# Patient Record
Sex: Female | Born: 1960 | Race: White | Hispanic: No | Smoking: Current every day smoker
Health system: Southern US, Community
[De-identification: ages and names within clinical notes are randomized; demographics above are authoritative.]

## PROBLEM LIST (undated history)

## (undated) DIAGNOSIS — E7800 Pure hypercholesterolemia, unspecified: Secondary | ICD-10-CM

## (undated) DIAGNOSIS — I10 Essential (primary) hypertension: Secondary | ICD-10-CM

## (undated) DIAGNOSIS — J449 Chronic obstructive pulmonary disease, unspecified: Secondary | ICD-10-CM

## (undated) DIAGNOSIS — F17200 Nicotine dependence, unspecified, uncomplicated: Secondary | ICD-10-CM

## (undated) DIAGNOSIS — A048 Other specified bacterial intestinal infections: Secondary | ICD-10-CM

## (undated) HISTORY — DX: Other specified bacterial intestinal infections: A04.8

## (undated) HISTORY — PX: SALPINGECTOMY: SHX328

---

## 1998-02-02 ENCOUNTER — Encounter: Admission: RE | Admit: 1998-02-02 | Discharge: 1998-02-02 | Payer: Self-pay | Admitting: Family Medicine

## 1998-03-30 ENCOUNTER — Encounter: Admission: RE | Admit: 1998-03-30 | Discharge: 1998-03-30 | Payer: Self-pay | Admitting: Family Medicine

## 1998-04-06 ENCOUNTER — Encounter: Admission: RE | Admit: 1998-04-06 | Discharge: 1998-04-06 | Payer: Self-pay | Admitting: Family Medicine

## 1998-04-13 ENCOUNTER — Encounter: Admission: RE | Admit: 1998-04-13 | Discharge: 1998-04-13 | Payer: Self-pay | Admitting: Family Medicine

## 1998-04-20 ENCOUNTER — Encounter: Admission: RE | Admit: 1998-04-20 | Discharge: 1998-04-20 | Payer: Self-pay | Admitting: Family Medicine

## 1998-05-16 ENCOUNTER — Emergency Department (HOSPITAL_COMMUNITY): Admission: EM | Admit: 1998-05-16 | Discharge: 1998-05-16 | Payer: Self-pay | Admitting: Emergency Medicine

## 1998-05-20 ENCOUNTER — Encounter: Admission: RE | Admit: 1998-05-20 | Discharge: 1998-05-20 | Payer: Self-pay | Admitting: Family Medicine

## 1998-10-14 ENCOUNTER — Encounter: Admission: RE | Admit: 1998-10-14 | Discharge: 1998-10-14 | Payer: Self-pay | Admitting: Family Medicine

## 1998-12-02 ENCOUNTER — Encounter: Admission: RE | Admit: 1998-12-02 | Discharge: 1998-12-02 | Payer: Self-pay | Admitting: Family Medicine

## 1999-01-21 ENCOUNTER — Encounter: Admission: RE | Admit: 1999-01-21 | Discharge: 1999-01-21 | Payer: Self-pay | Admitting: Family Medicine

## 1999-04-25 ENCOUNTER — Emergency Department (HOSPITAL_COMMUNITY): Admission: EM | Admit: 1999-04-25 | Discharge: 1999-04-25 | Payer: Self-pay | Admitting: Emergency Medicine

## 1999-04-25 ENCOUNTER — Encounter: Payer: Self-pay | Admitting: Emergency Medicine

## 1999-06-14 ENCOUNTER — Emergency Department (HOSPITAL_COMMUNITY): Admission: EM | Admit: 1999-06-14 | Discharge: 1999-06-14 | Payer: Self-pay | Admitting: Emergency Medicine

## 1999-09-28 ENCOUNTER — Encounter: Admission: RE | Admit: 1999-09-28 | Discharge: 1999-09-28 | Payer: Self-pay | Admitting: Sports Medicine

## 2003-12-26 ENCOUNTER — Emergency Department (HOSPITAL_COMMUNITY): Admission: EM | Admit: 2003-12-26 | Discharge: 2003-12-26 | Payer: Self-pay | Admitting: Emergency Medicine

## 2004-06-30 ENCOUNTER — Emergency Department (HOSPITAL_COMMUNITY): Admission: EM | Admit: 2004-06-30 | Discharge: 2004-06-30 | Payer: Self-pay | Admitting: Podiatry

## 2005-11-15 ENCOUNTER — Emergency Department (HOSPITAL_COMMUNITY): Admission: EM | Admit: 2005-11-15 | Discharge: 2005-11-15 | Payer: Self-pay | Admitting: Emergency Medicine

## 2006-08-01 ENCOUNTER — Emergency Department (HOSPITAL_COMMUNITY): Admission: EM | Admit: 2006-08-01 | Discharge: 2006-08-01 | Payer: Self-pay | Admitting: Emergency Medicine

## 2008-02-19 ENCOUNTER — Emergency Department (HOSPITAL_COMMUNITY): Admission: EM | Admit: 2008-02-19 | Discharge: 2008-02-19 | Payer: Self-pay | Admitting: Emergency Medicine

## 2010-10-15 ENCOUNTER — Emergency Department (HOSPITAL_COMMUNITY)
Admission: EM | Admit: 2010-10-15 | Discharge: 2010-10-15 | Payer: Self-pay | Source: Home / Self Care | Admitting: Emergency Medicine

## 2012-09-24 ENCOUNTER — Ambulatory Visit: Payer: Self-pay | Admitting: Family Medicine

## 2013-04-11 ENCOUNTER — Other Ambulatory Visit (HOSPITAL_COMMUNITY): Payer: Self-pay | Admitting: Family Medicine

## 2013-04-11 DIAGNOSIS — Z1231 Encounter for screening mammogram for malignant neoplasm of breast: Secondary | ICD-10-CM

## 2013-04-18 ENCOUNTER — Ambulatory Visit (HOSPITAL_COMMUNITY)
Admission: RE | Admit: 2013-04-18 | Discharge: 2013-04-18 | Disposition: A | Discharge status: Home / Self Care | Payer: No Typology Code available for payment source | Source: Ambulatory Visit | Attending: Family Medicine | Admitting: Family Medicine

## 2013-04-18 DIAGNOSIS — Z1231 Encounter for screening mammogram for malignant neoplasm of breast: Secondary | ICD-10-CM | POA: Insufficient documentation

## 2013-05-02 ENCOUNTER — Other Ambulatory Visit (HOSPITAL_COMMUNITY): Payer: Self-pay | Admitting: Internal Medicine

## 2013-05-02 DIAGNOSIS — Z1231 Encounter for screening mammogram for malignant neoplasm of breast: Secondary | ICD-10-CM

## 2017-06-06 ENCOUNTER — Emergency Department (HOSPITAL_COMMUNITY)
Admission: EM | Admit: 2017-06-06 | Discharge: 2017-06-06 | Disposition: A | Discharge status: Home / Self Care | Payer: Medicaid Other | Attending: Emergency Medicine | Admitting: Emergency Medicine

## 2017-06-06 ENCOUNTER — Emergency Department (HOSPITAL_COMMUNITY): Payer: Medicaid Other

## 2017-06-06 ENCOUNTER — Encounter (HOSPITAL_COMMUNITY): Payer: Self-pay | Admitting: Emergency Medicine

## 2017-06-06 DIAGNOSIS — I10 Essential (primary) hypertension: Secondary | ICD-10-CM | POA: Diagnosis not present

## 2017-06-06 DIAGNOSIS — R110 Nausea: Secondary | ICD-10-CM | POA: Insufficient documentation

## 2017-06-06 DIAGNOSIS — R42 Dizziness and giddiness: Secondary | ICD-10-CM | POA: Insufficient documentation

## 2017-06-06 LAB — DIFFERENTIAL
Basophils Absolute: 0.1 10*3/uL (ref 0.0–0.1)
Basophils Relative: 0 %
EOS PCT: 3 %
Eosinophils Absolute: 0.3 10*3/uL (ref 0.0–0.7)
Lymphocytes Relative: 28 %
Lymphs Abs: 3.2 10*3/uL (ref 0.7–4.0)
MONO ABS: 0.7 10*3/uL (ref 0.1–1.0)
Monocytes Relative: 6 %
NEUTROS PCT: 63 %
Neutro Abs: 7.3 10*3/uL (ref 1.7–7.7)

## 2017-06-06 LAB — PROTIME-INR
INR: 0.98
Prothrombin Time: 13 seconds (ref 11.4–15.2)

## 2017-06-06 LAB — COMPREHENSIVE METABOLIC PANEL
ALK PHOS: 80 U/L (ref 38–126)
ALT: 15 U/L (ref 14–54)
ANION GAP: 10 (ref 5–15)
AST: 17 U/L (ref 15–41)
Albumin: 3.3 g/dL — ABNORMAL LOW (ref 3.5–5.0)
BUN: 11 mg/dL (ref 6–20)
CALCIUM: 9.4 mg/dL (ref 8.9–10.3)
CO2: 27 mmol/L (ref 22–32)
CREATININE: 0.99 mg/dL (ref 0.44–1.00)
Chloride: 102 mmol/L (ref 101–111)
GFR calc non Af Amer: >60 mL/min (ref >60)
Glucose, Bld: 138 mg/dL — ABNORMAL HIGH (ref 65–99)
Potassium: 4.3 mmol/L (ref 3.5–5.1)
Sodium: 139 mmol/L (ref 135–145)
TOTAL PROTEIN: 7.5 g/dL (ref 6.5–8.1)
Total Bilirubin: 0.5 mg/dL (ref 0.3–1.2)

## 2017-06-06 LAB — I-STAT CHEM 8, ED
BUN: 13 mg/dL (ref 6–20)
CHLORIDE: 101 mmol/L (ref 101–111)
Calcium, Ion: 1.14 mmol/L — ABNORMAL LOW (ref 1.15–1.40)
Creatinine, Ser: 0.9 mg/dL (ref 0.44–1.00)
Glucose, Bld: 138 mg/dL — ABNORMAL HIGH (ref 65–99)
HEMATOCRIT: 44 % (ref 36.0–46.0)
HEMOGLOBIN: 15 g/dL (ref 12.0–15.0)
POTASSIUM: 4.3 mmol/L (ref 3.5–5.1)
Sodium: 139 mmol/L (ref 135–145)
TCO2: 29 mmol/L (ref 0–100)

## 2017-06-06 LAB — CBC
HCT: 43.5 % (ref 36.0–46.0)
Hemoglobin: 14.1 g/dL (ref 12.0–15.0)
MCH: 30 pg (ref 26.0–34.0)
MCHC: 32.4 g/dL (ref 30.0–36.0)
MCV: 92.6 fL (ref 78.0–100.0)
PLATELETS: 319 10*3/uL (ref 150–400)
RBC: 4.7 MIL/uL (ref 3.87–5.11)
RDW: 14.5 % (ref 11.5–15.5)
WBC: 11.6 10*3/uL — AB (ref 4.0–10.5)

## 2017-06-06 LAB — APTT: aPTT: 32 seconds (ref 24–36)

## 2017-06-06 LAB — ETHANOL

## 2017-06-06 LAB — I-STAT TROPONIN, ED: Troponin i, poc: 0.01 ng/mL (ref 0.00–0.08)

## 2017-06-06 LAB — CBG MONITORING, ED: GLUCOSE-CAPILLARY: 128 mg/dL — AB (ref 65–99)

## 2017-06-06 MED ORDER — AMLODIPINE BESYLATE 5 MG PO TABS: 5.0000 mg | ORAL_TABLET | Freq: Every day | ORAL | 1 refills | Status: DC

## 2017-06-06 NOTE — ED Provider Notes (Signed)
[facility]-EMERGENCY DEPT Provider Note   CSN: [id number] Arrival date & time: 06/06/17  1607   An emergency department physician performed an initial assessment on this suspected stroke patient at 1625.  History   Chief Complaint Chief Complaint  Patient presents with  . Dizziness    HPI Kim [name] is a 56 y.o. female.  She presents for evaluation of sudden onset of dizziness, around 1 PM today.  She was at home at that time, sitting, and watching TV.  She describes the dizziness as "lightheadedness."  When queried, she states that she felt "off balance."  She denies a spinning sensation.  She was able to walk without exacerbation of her dizziness.  She denies associated headache.  She had mild nausea, without vomiting.  No recent illnesses including fever, chills, chest pain, shortness of breath, neck pain or back pain.  She is a smoker.  She does not have a primary care doctor.  She cannot recall the last time she had her blood pressure checked.  There are no other known modifying factors.   HPI  History reviewed. No pertinent past medical history.  There are no active problems to display for this patient.   History reviewed. No pertinent surgical history.  OB History    No data available       Home Medications    Prior to Admission medications   Medication Sig Start Date End Date Taking? Authorizing Provider  amLODipine (NORVASC) 5 MG tablet Take 1 tablet (5 mg total) by mouth daily. 06/06/17   Mancel Bale, MD    Family History No family history on file.  Social History Social History  Substance Use Topics  . Smoking status: Not on file  . Smokeless tobacco: Not on file  . Alcohol use Not on file     Allergies   Patient has no known allergies.   Review of Systems Review of Systems  All other systems reviewed and are negative.    Physical Exam Updated Vital Signs BP (!) 186/64 (BP Location: Right Arm)   Pulse 81   Temp 98 F (36.7 C)   Resp 16    SpO2 96%   Physical Exam  Constitutional: She is oriented to person, place, and time. She appears well-developed and well-nourished. No distress.  Obese, appears older than stated age.  HENT:  Head: Normocephalic and atraumatic.  Eyes: Pupils are equal, round, and reactive to light. Conjunctivae and EOM are normal.  Neck: Normal range of motion and phonation normal. Neck supple.  Cardiovascular: Normal rate and regular rhythm.   Blood pressure cuff on right forearm.  Pulmonary/Chest: Effort normal and breath sounds normal. No respiratory distress. She exhibits no tenderness.  Abdominal: Soft. She exhibits no distension. There is no tenderness. There is no guarding.  Musculoskeletal: Normal range of motion.  Neurological: She is alert and oriented to person, place, and time. No cranial nerve deficit or sensory deficit. She exhibits normal muscle tone. Coordination normal.  No dysarthria, aphasia or nystagmus.  Negative test of skew and negative head impulse testing.  Skin: Skin is warm and dry.  Psychiatric: She has a normal mood and affect. Her behavior is normal. Judgment and thought content normal.  Nursing note and vitals reviewed.    ED Treatments / Results  Labs (all labs ordered are listed, but only abnormal results are displayed) Labs Reviewed  CBC - Abnormal; Notable for the following:       Result Value   WBC 11.6 (*)  All other components within normal limits  COMPREHENSIVE METABOLIC PANEL - Abnormal; Notable for the following:    Glucose, Bld 138 (*)    Albumin 3.3 (*)    All other components within normal limits  I-STAT CHEM 8, ED - Abnormal; Notable for the following:    Glucose, Bld 138 (*)    Calcium, Ion 1.14 (*)    All other components within normal limits  CBG MONITORING, ED - Abnormal; Notable for the following:    Glucose-Capillary 128 (*)    All other components within normal limits  ETHANOL  PROTIME-INR  APTT  DIFFERENTIAL  I-STAT TROPONIN, ED     EKG  EKG Interpretation  Date/Time:  Tuesday June 06 2017 16:49:52 EDT Ventricular Rate:  90 PR Interval:    QRS Duration: 79 QT Interval:  368 QTC Calculation: 451 R Axis:   80 Text Interpretation:  Sinus rhythm No old tracing to compare Confirmed by Mancel BaleWentz, Robey Massmann [phone]([id number]) on 06/06/2017 5:24:02 PM Also confirmed by Mancel BaleWentz, Julee Stoll [phone]), editor Elita QuickWatlington, Beverly ([id number])  on 06/07/2017 8:19:55 AM       Radiology Ct Head Code Stroke Wo Contrast  Result Date: 06/06/2017 CLINICAL DATA:  Code stroke.  56 y/o  F; slurred speech. EXAM: CT HEAD WITHOUT CONTRAST TECHNIQUE: Contiguous axial images were obtained from the base of the skull through the vertex without intravenous contrast. COMPARISON:  None. FINDINGS: Brain: No evidence of acute infarction, hemorrhage, hydrocephalus, extra-axial collection or mass lesion/mass effect. Vascular: No hyperdense vessel identified. Mild calcific atherosclerosis of carotid siphons. Skull: Normal. Negative for fracture or focal lesion. Sinuses/Orbits: No acute finding. Other: Low-attenuation 19 mm well-circumscribed lesion in the subcutaneous fat overlying the left parotid gland, probably a dermal cyst, direct visualization recommended. ASPECTS [facility]([place] Stroke Program Early CT Score) - Ganglionic level infarction (caudate, lentiform nuclei, internal capsule, insula, M1-M3 cortex): 7 - Supraganglionic infarction (M4-M6 cortex): 3 Total score (0-10 with 10 being normal): 10 IMPRESSION: 1. No acute intracranial abnormality identified. Unremarkable CT of head for age. 2. ASPECTS is 10 These results were called by telephone at the time of interpretation on 06/06/2017 at 4:43 pm to PA Felicie Mornavid Smith, who verbally acknowledged these results. Electronically Signed   By: Mitzi HansenLance  Furusawa-Stratton M.D.   On: 06/06/2017 16:43    Procedures Procedures (including critical care time)  Medications Ordered in ED Medications - No data to display   Initial Impression /  Assessment and Plan / ED Course  I have reviewed the triage vital signs and the nursing notes.  Pertinent labs & imaging results that were available during my care of the patient were reviewed by me and considered in my medical decision making (see chart for details).      Patient Vitals for the past 24 hrs:  BP Temp Temp src Pulse Resp SpO2  06/06/17 1823 (!) 186/64 - - - - -  06/06/17 1820 - 98 F (36.7 C) - - - -  06/06/17 1745 (!) 186/72 - - 81 16 96 %  06/06/17 1730 (!) 172/76 - - 88 (!) 25 97 %  06/06/17 1715 (!) 185/76 - - 84 (!) 21 95 %  06/06/17 1700 (!) 187/83 - - 85 20 94 %  06/06/17 1613 - 98 F (36.7 C) Oral 90 - 96 %    At discharge- reevaluation with update and discussion. After initial assessment and treatment, an updated evaluation reveals she is comfortable and states she is pain-free.  She refuses MRI, advice without completing treatment evaluation.  Patient's husband was with her at the time, and understands the implications of our concern for her welfare.  The patient is felt to have capacity for decision-making at this time. Ramaya Guile L    Final Clinical Impressions(s) / ED Diagnoses   Final diagnoses:  Dizziness  Hypertension, unspecified type   Dizziness and mild hypertension.  CT negative for CVA.  Mild hypertension, without other signs of endorgan damage.  Doubt hypertensive urgency, serious bacterial infection, metabolic instability or impending vascular collapse.  The patient refused MRI and she will advice, prior to completion of evaluation.   Nursing Notes Reviewed/ Care Coordinated Applicable Imaging Reviewed Interpretation of Laboratory Data incorporated into ED treatment  The patient appears reasonably screened and/or stabilized for discharge and I doubt any other medical condition or other [facility] requiring further screening, evaluation, or treatment in the ED at this time prior to discharge.  Plan: Home Medications-continue usual medications;  Home Treatments-rest; return here if the recommended treatment, does not improve the symptoms; Recommended follow up-PCP, 1-2 weeks and as needed   New Prescriptions Discharge Medication List as of 06/06/2017  6:55 PM    START taking these medications   Details  amLODipine (NORVASC) 5 MG tablet Take 1 tablet (5 mg total) by mouth daily., Starting Tue 06/06/2017, Print         Mancel Bale, MD 06/07/17 1352

## 2017-06-06 NOTE — ED Notes (Signed)
Pt ambulatory to bathroom without any problems 

## 2017-06-06 NOTE — ED Notes (Signed)
STROKE @1622 

## 2017-06-06 NOTE — Discharge Instructions (Signed)
It is important to follow-up with a primary care doctor as soon as possible for further evaluation and treatment.  If you develop worsening dizziness, return here immediately.  Make sure you are trying to minimize your salt intake, to improve your blood pressure.

## 2017-06-06 NOTE — ED Notes (Signed)
Pt refusing IV and MRI at this time,.  Dr. Nani SkillernKirikpatrick made aware.

## 2017-06-06 NOTE — ED Triage Notes (Signed)
Pt states at 1400 she became dizzy and felt off balance per husband her speech sounds slurred. Pt has equal grips , no sensation loss.

## 2017-06-06 NOTE — Consult Note (Signed)
Requesting Physician: Dr. Seymour BarsWentze    Chief Complaint: dizziness  History obtained from:  Patient     HPI:                                                                                                                                         Val EagleConnie L [name] is an 56 y.o. female who has not seen a doctor and multiple years. Patient is on no medications and does not know her past medical history. Patient does admit to smoking. Patient states that she is watching TV when she had a gradual onset of dizziness and nausea feeling. Patient was brought to the emergency department where code stroke was called. On initial exam her NIH scale was 0. Patient showed no ataxia and did not admit to any blurred vision. Apparently EMS had a blood pressure with systolic blood pressures in the 200s upon being hooked up in the ED her blood pressure come down to 187/105.  Date last known well: Date: 06/06/2017 Time last known well: Time: 14:00 tPA Given: No: Minimal symptoms  Modified Rankin: Rankin Score=0   History reviewed. No pertinent past medical history.  History reviewed. No pertinent surgical history.  No family history on file. Social History:  has no tobacco, alcohol, and drug history on file.  Allergies: No Known Allergies  Medications:                                                                                                                          No current facility-administered medications for this encounter.    No current outpatient prescriptions on file.     ROS:  History obtained from the patient  General ROS: negative for - chills, fatigue, fever, night sweats, weight gain or weight loss Psychological ROS: negative for - behavioral disorder, hallucinations, memory difficulties, mood swings or suicidal ideation Ophthalmic ROS: negative for - blurry  vision, double vision, eye pain or loss of vision ENT ROS: negative for - epistaxis, nasal discharge, oral lesions, sore throat, tinnitus or vertigo Allergy and Immunology ROS: negative for - hives or itchy/watery eyes Hematological and Lymphatic ROS: negative for - bleeding problems, bruising or swollen lymph nodes Endocrine ROS: negative for - galactorrhea, hair pattern changes, polydipsia/polyuria or temperature intolerance Respiratory ROS: negative for - cough, hemoptysis, shortness of breath or wheezing Cardiovascular ROS: negative for - chest pain, dyspnea on exertion, edema or irregular heartbeat Gastrointestinal ROS: negative for - abdominal pain, diarrhea, hematemesis, nausea/vomiting or stool incontinence Genito-Urinary ROS: negative for - dysuria, hematuria, incontinence or urinary frequency/urgency Musculoskeletal ROS: negative for - joint swelling or muscular weakness Neurological ROS: as noted in HPI Dermatological ROS: negative for rash and skin lesion changes  Neurologic Examination:                                                                                                      Pulse 90, temperature 98 F (36.7 C), temperature source Oral, SpO2 96 %.  HEENT-  Normocephalic, no lesions, without obvious abnormality.  Normal external eye and conjunctiva.  Normal TM's bilaterally.  Normal auditory canals and external ears. Normal external nose, mucus membranes and septum.  Normal pharynx. Cardiovascular- S1, S2 normal, pulses palpable throughout   Lungs- chest clear, no wheezing, rales, normal symmetric air entry Abdomen- normal findings: bowel sounds normal Extremities- no edema Lymph-no adenopathy palpable Musculoskeletal-no joint tenderness, deformity or swelling Skin-warm and dry, no hyperpigmentation, vitiligo, or suspicious lesions  Neurological Examination Mental Status: Alert, oriented, thought content appropriate.  Speech fluent without evidence of aphasia.  Able  to follow 3 step commands without difficulty. Cranial Nerves: II:  Visual fields grossly normal,  III,IV, VI: ptosis not present, extra-ocular motions intact bilaterally, pupils equal, round, reactive to light and accommodation V,VII: smile symmetric, facial light touch sensation normal bilaterally VIII: hearing normal bilaterally IX,X: uvula rises symmetrically XI: bilateral shoulder shrug XII: midline tongue extension Motor: Right : Upper extremity   5/5    Left:     Upper extremity   5/5  Lower extremity   5/5     Lower extremity   5/5 Tone and bulk:normal tone throughout; no atrophy noted Sensory: Pinprick and light touch intact throughout, bilaterally Deep Tendon Reflexes: 2+ and symmetric throughout Plantars: Right: downgoing   Left: downgoing Cerebellar: normal finger-to-nose, normal rapid alternating movements and normal heel-to-shin test Gait: Not tested       Lab Results: Basic Metabolic Panel:  Recent Labs Lab 06/06/17 1634  NA 139  K 4.3  CL 101  GLUCOSE 138*  BUN 13  CREATININE 0.90    Liver Function Tests: No results for input(s): AST, ALT, ALKPHOS, BILITOT, PROT, ALBUMIN in the last 168 hours. No results for input(s): LIPASE, AMYLASE in  the last 168 hours. No results for input(s): AMMONIA in the last 168 hours.  CBC:  Recent Labs Lab 06/06/17 1619 06/06/17 1634  WBC 11.6*  --   NEUTROABS 7.3  --   HGB 14.1 15.0  HCT 43.5 44.0  MCV 92.6  --   PLT 319  --     Cardiac Enzymes: No results for input(s): CKTOTAL, CKMB, CKMBINDEX, TROPONINI in the last 168 hours.  Lipid Panel: No results for input(s): CHOL, TRIG, HDL, CHOLHDL, VLDL, LDLCALC in the last 168 hours.  CBG: No results for input(s): GLUCAP in the last 168 hours.  Microbiology: No results found for this or any previous visit.  Coagulation Studies: No results for input(s): LABPROT, INR in the last 72 hours.  Imaging: Ct Head Code Stroke Wo Contrast  Result Date:  06/06/2017 CLINICAL DATA:  Code stroke.  56 y/o  F; slurred speech. EXAM: CT HEAD WITHOUT CONTRAST TECHNIQUE: Contiguous axial images were obtained from the base of the skull through the vertex without intravenous contrast. COMPARISON:  None. FINDINGS: Brain: No evidence of acute infarction, hemorrhage, hydrocephalus, extra-axial collection or mass lesion/mass effect. Vascular: No hyperdense vessel identified. Mild calcific atherosclerosis of carotid siphons. Skull: Normal. Negative for fracture or focal lesion. Sinuses/Orbits: No acute finding. Other: Low-attenuation 19 mm well-circumscribed lesion in the subcutaneous fat overlying the left parotid gland, probably a dermal cyst, direct visualization recommended. ASPECTS [facility] Stroke Program Early CT Score) - Ganglionic level infarction (caudate, lentiform nuclei, internal capsule, insula, M1-M3 cortex): 7 - Supraganglionic infarction (M4-M6 cortex): 3 Total score (0-10 with 10 being normal): 10 IMPRESSION: 1. No acute intracranial abnormality identified. Unremarkable CT of head for age. 2. ASPECTS is 10 These results were called by telephone at the time of interpretation on 06/06/2017 at 4:43 pm to PA Felicie Morn, who verbally acknowledged these results. Electronically Signed   By: Mitzi Hansen M.D.   On: 06/06/2017 16:43       Assessment and plan discussed with with attending physician and they are in agreement.    Felicie Morn PA-C [facility] [phone]  06/06/2017, 4:49 PM   Assessment: 56 y.o. female presenting to the emergency room with gradual onset of dizziness/nausea in the setting of hypertensive urgency. Patient continues to complain of dizziness however exam is nonfocal at this time. Given her high blood pressure cannot exclude possible small cerebellar stroke  Stroke Risk Factors - none  Recommend: 1) MRI brain to exclude small stroke 2) further recommendations pending above imaging  Ritta Slot,  MD [facility] [phone]  If 7pm- 7am, please page neurology on call as listed in AMION.

## 2017-12-26 ENCOUNTER — Other Ambulatory Visit: Payer: Self-pay | Admitting: Family Medicine

## 2017-12-26 ENCOUNTER — Other Ambulatory Visit: Payer: Self-pay | Admitting: Internal Medicine

## 2017-12-26 DIAGNOSIS — Z1231 Encounter for screening mammogram for malignant neoplasm of breast: Secondary | ICD-10-CM

## 2017-12-27 ENCOUNTER — Ambulatory Visit (HOSPITAL_COMMUNITY)
Admission: RE | Admit: 2017-12-27 | Discharge: 2017-12-27 | Disposition: A | Discharge status: Home / Self Care | Payer: Medicaid Other | Source: Ambulatory Visit | Attending: Internal Medicine | Admitting: Internal Medicine

## 2017-12-27 ENCOUNTER — Other Ambulatory Visit (HOSPITAL_COMMUNITY): Payer: Self-pay | Admitting: Internal Medicine

## 2017-12-27 DIAGNOSIS — Z720 Tobacco use: Secondary | ICD-10-CM | POA: Insufficient documentation

## 2017-12-27 DIAGNOSIS — I700 Atherosclerosis of aorta: Secondary | ICD-10-CM | POA: Insufficient documentation

## 2017-12-27 DIAGNOSIS — R918 Other nonspecific abnormal finding of lung field: Secondary | ICD-10-CM | POA: Diagnosis not present

## 2018-01-12 ENCOUNTER — Ambulatory Visit: Payer: Medicaid Other

## 2018-01-12 ENCOUNTER — Ambulatory Visit: Payer: No Typology Code available for payment source

## 2018-01-12 ENCOUNTER — Ambulatory Visit
Admission: RE | Admit: 2018-01-12 | Discharge: 2018-01-12 | Disposition: A | Discharge status: Home / Self Care | Payer: Medicaid Other | Source: Ambulatory Visit | Attending: Internal Medicine | Admitting: Internal Medicine

## 2018-01-12 DIAGNOSIS — Z1231 Encounter for screening mammogram for malignant neoplasm of breast: Secondary | ICD-10-CM

## 2018-07-26 ENCOUNTER — Emergency Department (HOSPITAL_COMMUNITY): Payer: Medicaid Other

## 2018-07-26 ENCOUNTER — Encounter (HOSPITAL_COMMUNITY): Payer: Self-pay | Admitting: *Deleted

## 2018-07-26 ENCOUNTER — Emergency Department (HOSPITAL_COMMUNITY)
Admission: EM | Admit: 2018-07-26 | Discharge: 2018-07-26 | Disposition: A | Discharge status: Home / Self Care | Payer: Medicaid Other | Attending: Emergency Medicine | Admitting: Emergency Medicine

## 2018-07-26 DIAGNOSIS — F17210 Nicotine dependence, cigarettes, uncomplicated: Secondary | ICD-10-CM | POA: Insufficient documentation

## 2018-07-26 DIAGNOSIS — R200 Anesthesia of skin: Secondary | ICD-10-CM | POA: Diagnosis not present

## 2018-07-26 DIAGNOSIS — I10 Essential (primary) hypertension: Secondary | ICD-10-CM | POA: Insufficient documentation

## 2018-07-26 DIAGNOSIS — R202 Paresthesia of skin: Secondary | ICD-10-CM

## 2018-07-26 DIAGNOSIS — R42 Dizziness and giddiness: Secondary | ICD-10-CM | POA: Insufficient documentation

## 2018-07-26 DIAGNOSIS — Z79899 Other long term (current) drug therapy: Secondary | ICD-10-CM | POA: Insufficient documentation

## 2018-07-26 DIAGNOSIS — E7800 Pure hypercholesterolemia, unspecified: Secondary | ICD-10-CM | POA: Insufficient documentation

## 2018-07-26 HISTORY — DX: Pure hypercholesterolemia, unspecified: E78.00

## 2018-07-26 HISTORY — DX: Essential (primary) hypertension: I10

## 2018-07-26 LAB — CBC WITH DIFFERENTIAL/PLATELET
BASOS PCT: 1 %
Basophils Absolute: 0.2 10*3/uL — ABNORMAL HIGH (ref 0.0–0.1)
EOS PCT: 2 %
Eosinophils Absolute: 0.3 10*3/uL (ref 0.0–0.7)
HCT: 47.3 % — ABNORMAL HIGH (ref 36.0–46.0)
HEMOGLOBIN: 14.7 g/dL (ref 12.0–15.0)
LYMPHS PCT: 27 %
Lymphs Abs: 4.2 10*3/uL — ABNORMAL HIGH (ref 0.7–4.0)
MCH: 29.6 pg (ref 26.0–34.0)
MCHC: 31.1 g/dL (ref 30.0–36.0)
MCV: 95.4 fL (ref 78.0–100.0)
Monocytes Absolute: 1.1 10*3/uL — ABNORMAL HIGH (ref 0.1–1.0)
Monocytes Relative: 7 %
NEUTROS PCT: 63 %
Neutro Abs: 9.9 10*3/uL — ABNORMAL HIGH (ref 1.7–7.7)
Platelets: 355 10*3/uL (ref 150–400)
RBC: 4.96 MIL/uL (ref 3.87–5.11)
RDW: 15.1 % (ref 11.5–15.5)
WBC: 15.7 10*3/uL — ABNORMAL HIGH (ref 4.0–10.5)

## 2018-07-26 LAB — COMPREHENSIVE METABOLIC PANEL
ALK PHOS: 82 U/L (ref 38–126)
ALT: 14 U/L (ref 0–44)
AST: 17 U/L (ref 15–41)
Albumin: 3.3 g/dL — ABNORMAL LOW (ref 3.5–5.0)
Anion gap: 11 (ref 5–15)
BUN: 9 mg/dL (ref 6–20)
CO2: 25 mmol/L (ref 22–32)
Calcium: 9.3 mg/dL (ref 8.9–10.3)
Chloride: 103 mmol/L (ref 98–111)
Creatinine, Ser: 0.97 mg/dL (ref 0.44–1.00)
Glucose, Bld: 71 mg/dL (ref 70–99)
Potassium: 3.9 mmol/L (ref 3.5–5.1)
Sodium: 139 mmol/L (ref 135–145)
TOTAL PROTEIN: 7.2 g/dL (ref 6.5–8.1)
Total Bilirubin: 0.6 mg/dL (ref 0.3–1.2)

## 2018-07-26 NOTE — ED Provider Notes (Signed)
[facility] EMERGENCY DEPARTMENT Provider Note   CSN: [id number] Arrival date & time: 07/26/18  3244     History   Chief Complaint Chief Complaint  Patient presents with  . Dizziness    HPI Kim [name] is a 57 y.o. female.  57 year old female with past medical history including hypertension hyperlipidemia who presents with numbness.  Patient was driving her car this morning when she had sudden onset of bilateral, upper and lower lip numbness, entire tongue numbness, and numbness around her mouth.  He did not began feeling dizzy which she describes as a shakiness feeling.  She had some mild nausea but denies any vomiting, headache, or other complaints of pain.  No focal weakness.  Currently she just feels tired.  She denies any unilateral symptoms.  No shortness of breath or anxiety during the episode.  Episode lasted approximately 30 minutes and is currently resolved.  She denies any fevers or recent illness.  The history is provided by the patient.  Dizziness    Past Medical History:  Diagnosis Date  . High cholesterol   . Hypertension     There are no active problems to display for this patient.   History reviewed. No pertinent surgical history.   OB History   None      Home Medications    Prior to Admission medications   Medication Sig Start Date End Date Taking? Authorizing Provider  amLODipine (NORVASC) 5 MG tablet Take 1 tablet (5 mg total) by mouth daily. 06/06/17  Yes Mancel Bale, MD  ibuprofen (ADVIL,MOTRIN) 200 MG tablet Take 200 mg by mouth every 6 (six) hours as needed for moderate pain.   Yes [provider]  pravastatin (PRAVACHOL) 20 MG tablet Take 20 mg by mouth daily. 07/02/18  Yes [provider]    Family History No family history on file.  Social History Social History   Tobacco Use  . Smoking status: Current Every Day Smoker    Packs/day: 1.00    Types: Cigarettes  . Smokeless tobacco: Never Used    Substance Use Topics  . Alcohol use: Not Currently  . Drug use: Not Currently     Allergies   Patient has no known allergies.   Review of Systems Review of Systems  Neurological: Positive for dizziness.   All other systems reviewed and are negative except that which was mentioned in HPI   Physical Exam Updated Vital Signs BP (!) 144/70   Pulse 87   Temp 97.6 F (36.4 C) (Oral)   Resp (!) 21   Ht 5' 4.5" (1.638 m)   Wt 124.7 kg   SpO2 96%   BMI 46.47 kg/m   Physical Exam  Constitutional: She is oriented to person, place, and time. She appears well-developed and well-nourished. No distress.  Awake, alert  HENT:  Head: Normocephalic and atraumatic.  Eyes: Pupils are equal, round, and reactive to light. Conjunctivae and EOM are normal.  Neck: Neck supple.  Cardiovascular: Normal rate, regular rhythm and normal heart sounds.  No murmur heard. Pulmonary/Chest: Effort normal and breath sounds normal. No respiratory distress.  Abdominal: Soft. Bowel sounds are normal. She exhibits no distension. There is no tenderness.  Musculoskeletal: She exhibits no edema.  Neurological: She is alert and oriented to person, place, and time. She has normal reflexes. No cranial nerve deficit. She exhibits normal muscle tone.  Fluent speech, normal finger-to-nose testing, negative pronator drift, no clonus 5/5 strength and normal sensation x all 4  extremities  Skin: Skin is warm and dry.  Psychiatric: She has a normal mood and affect. Judgment and thought content normal.  Nursing note and vitals reviewed.    ED Treatments / Results  Labs (all labs ordered are listed, but only abnormal results are displayed) Labs Reviewed  CBC WITH DIFFERENTIAL/PLATELET - Abnormal; Notable for the following components:      Result Value   WBC 15.7 (*)    HCT 47.3 (*)    Neutro Abs 9.9 (*)    Lymphs Abs 4.2 (*)    Monocytes Absolute 1.1 (*)    Basophils Absolute 0.2 (*)    All other components  within normal limits  COMPREHENSIVE METABOLIC PANEL - Abnormal; Notable for the following components:   Albumin 3.3 (*)    All other components within normal limits    EKG EKG Interpretation  Date/Time:  Thursday July 26 2018 07:40:30 EDT Ventricular Rate:  85 PR Interval:  170 QRS Duration: 68 QT Interval:  368 QTC Calculation: 437 R Axis:   92 Text Interpretation:  Normal sinus rhythm Rightward axis Nonspecific ST and T wave abnormality Abnormal ECG similar to previous Confirmed by Frederick Peers [phone]) on 07/26/2018 8:31:30 AM   Radiology Ct Head Wo Contrast  Result Date: 07/26/2018 CLINICAL DATA:  Altered level of consciousness, dizziness, facial numbness, onset 45 minutes ago EXAM: CT HEAD WITHOUT CONTRAST TECHNIQUE: Contiguous axial images were obtained from the base of the skull through the vertex without intravenous contrast. COMPARISON:  CT brain scan of 06/06/2017 FINDINGS: Brain: The ventricular system is within normal limits in size and the septum is midline in position. The fourth ventricle and basilar cisterns are unremarkable. No hemorrhage, mass lesion, or acute infarction is seen. Vascular: No vascular abnormality is noted on this unenhanced study. Skull: On bone window images, no calvarial abnormality is seen. Sinuses/Orbits: The paranasal sinuses appear well pneumatized. Other: None. IMPRESSION: Negative unenhanced CT of the brain. No acute intracranial abnormality. Electronically Signed   By: Dwyane Dee M.D.   On: 07/26/2018 10:39    Procedures Procedures (including critical care time)  Medications Ordered in ED Medications - No data to display   Initial Impression / Assessment and Plan / ED Course  I have reviewed the triage vital signs and the nursing notes.  Pertinent labs & imaging results that were available during my care of the patient were reviewed by me and considered in my medical decision making (see chart for details).     Well-appearing,  afebrile, normal neurologic exam.  Labs show no electrolyte derangements to explain her symptoms.  She has a mild leukocytosis of unclear significance, denies any infectious symptoms and I doubt that it is related to her current complaints.  Head CT negative for acute process.  Given that her numbness was bilateral perioral, I feel stroke/TIA is very unlikely especially given no other associated sx. sensibly reviewed return precautions with the patient and her husband and they voiced understanding. Final Clinical Impressions(s) / ED Diagnoses   Final diagnoses:  Paresthesias  Dizziness    ED Discharge Orders    None       Little, Ambrose Finland, MD 07/26/18 [phone]

## 2018-07-26 NOTE — Discharge Instructions (Addendum)
RETURN TO ER IF YOU HAVE ANY WEAKNESS, NUMBNESS ON 1 SIDE OF YOUR BODY, SPEECH PROBLEMS, CONFUSION, OR SUDDEN NEW SYMPTOMS.

## 2018-07-26 NOTE — ED Triage Notes (Signed)
Preston Fleeting, MD notified of pts symptoms, Preston Fleeting, MD to come to triage and assess the pt

## 2018-07-26 NOTE — ED Triage Notes (Signed)
Pt in c/o lip and tongue numbness onset today while driving her family to work onset 45 mins ago, pt then reports bil facial numbess, pt reports onset of dizziness at that time, VAN negative, no facial droop present, no slurred speech, A&O x4, Preston Fleeting, MD evaluating the pt

## 2018-07-26 NOTE — ED Provider Notes (Signed)
MSE was initiated and I personally evaluated the patient and placed orders (if any) at  7:58 AM on July 26, 2018.  The patient appears stable so that the remainder of the MSE may be completed by another provider.  She noted numbness in her tongue which spread to circumoral and around the nose.  Numbness was bilateral.  She then developed some dizziness which, while not spinning, involved sense of things moving.  On exam, there is no nystagmus.  Dizziness is reproduced by passive head movement, but not quite reproducing her symptoms.  No facial droop is seen.  No objective sensory loss.  Strength is 5/5 in both arms and both legs, no pronator drift, no extinction on double simultaneous stimulation.  No indication of stroke.   Dione Booze, MD 07/26/18 0800

## 2018-12-19 ENCOUNTER — Other Ambulatory Visit: Payer: Self-pay | Admitting: Internal Medicine

## 2018-12-19 DIAGNOSIS — Z1231 Encounter for screening mammogram for malignant neoplasm of breast: Secondary | ICD-10-CM

## 2019-01-15 ENCOUNTER — Ambulatory Visit: Payer: Medicaid Other

## 2019-02-12 ENCOUNTER — Ambulatory Visit: Payer: Medicaid Other

## 2019-09-01 ENCOUNTER — Emergency Department (HOSPITAL_COMMUNITY): Payer: Medicaid Other

## 2019-09-01 ENCOUNTER — Inpatient Hospital Stay (HOSPITAL_COMMUNITY)
Admission: EM | Admit: 2019-09-01 | Discharge: 2019-09-13 | DRG: 189 | Disposition: A | Discharge status: Home Health | Payer: Medicaid Other | Attending: Internal Medicine | Admitting: Internal Medicine

## 2019-09-01 ENCOUNTER — Encounter (HOSPITAL_COMMUNITY): Payer: Self-pay

## 2019-09-01 ENCOUNTER — Other Ambulatory Visit: Payer: Self-pay

## 2019-09-01 DIAGNOSIS — R0902 Hypoxemia: Secondary | ICD-10-CM

## 2019-09-01 DIAGNOSIS — G4733 Obstructive sleep apnea (adult) (pediatric): Secondary | ICD-10-CM

## 2019-09-01 DIAGNOSIS — E7800 Pure hypercholesterolemia, unspecified: Secondary | ICD-10-CM | POA: Diagnosis present

## 2019-09-01 DIAGNOSIS — Z20828 Contact with and (suspected) exposure to other viral communicable diseases: Secondary | ICD-10-CM | POA: Diagnosis present

## 2019-09-01 DIAGNOSIS — Z8249 Family history of ischemic heart disease and other diseases of the circulatory system: Secondary | ICD-10-CM

## 2019-09-01 DIAGNOSIS — J449 Chronic obstructive pulmonary disease, unspecified: Secondary | ICD-10-CM

## 2019-09-01 DIAGNOSIS — R0602 Shortness of breath: Secondary | ICD-10-CM

## 2019-09-01 DIAGNOSIS — M419 Scoliosis, unspecified: Secondary | ICD-10-CM | POA: Diagnosis present

## 2019-09-01 DIAGNOSIS — R739 Hyperglycemia, unspecified: Secondary | ICD-10-CM | POA: Diagnosis not present

## 2019-09-01 DIAGNOSIS — E785 Hyperlipidemia, unspecified: Secondary | ICD-10-CM | POA: Diagnosis present

## 2019-09-01 DIAGNOSIS — I10 Essential (primary) hypertension: Secondary | ICD-10-CM | POA: Diagnosis present

## 2019-09-01 DIAGNOSIS — J810 Acute pulmonary edema: Secondary | ICD-10-CM | POA: Diagnosis not present

## 2019-09-01 DIAGNOSIS — Z6841 Body Mass Index (BMI) 40.0 and over, adult: Secondary | ICD-10-CM

## 2019-09-01 DIAGNOSIS — F17210 Nicotine dependence, cigarettes, uncomplicated: Secondary | ICD-10-CM | POA: Diagnosis present

## 2019-09-01 DIAGNOSIS — J9621 Acute and chronic respiratory failure with hypoxia: Principal | ICD-10-CM | POA: Diagnosis present

## 2019-09-01 DIAGNOSIS — B9789 Other viral agents as the cause of diseases classified elsewhere: Secondary | ICD-10-CM | POA: Diagnosis not present

## 2019-09-01 DIAGNOSIS — J441 Chronic obstructive pulmonary disease with (acute) exacerbation: Secondary | ICD-10-CM | POA: Diagnosis present

## 2019-09-01 DIAGNOSIS — J9601 Acute respiratory failure with hypoxia: Secondary | ICD-10-CM | POA: Diagnosis present

## 2019-09-01 LAB — CBC WITH DIFFERENTIAL/PLATELET
Abs Immature Granulocytes: 0.12 10*3/uL — ABNORMAL HIGH (ref 0.00–0.07)
Basophils Absolute: 0.1 10*3/uL (ref 0.0–0.1)
Basophils Relative: 1 %
Eosinophils Absolute: 0.1 10*3/uL (ref 0.0–0.5)
Eosinophils Relative: 1 %
HCT: 50.1 % — ABNORMAL HIGH (ref 36.0–46.0)
Hemoglobin: 15.4 g/dL — ABNORMAL HIGH (ref 12.0–15.0)
Immature Granulocytes: 1 %
Lymphocytes Relative: 21 %
Lymphs Abs: 2.6 10*3/uL (ref 0.7–4.0)
MCH: 30 pg (ref 26.0–34.0)
MCHC: 30.7 g/dL (ref 30.0–36.0)
MCV: 97.7 fL (ref 80.0–100.0)
Monocytes Absolute: 0.7 10*3/uL (ref 0.1–1.0)
Monocytes Relative: 6 %
Neutro Abs: 8.6 10*3/uL — ABNORMAL HIGH (ref 1.7–7.7)
Neutrophils Relative %: 70 %
Platelets: 314 10*3/uL (ref 150–400)
RBC: 5.13 MIL/uL — ABNORMAL HIGH (ref 3.87–5.11)
RDW: 15.1 % (ref 11.5–15.5)
WBC: 12.3 10*3/uL — ABNORMAL HIGH (ref 4.0–10.5)
nRBC: 0 % (ref 0.0–0.2)

## 2019-09-01 LAB — POCT I-STAT EG7
Acid-Base Excess: 6 mmol/L — ABNORMAL HIGH (ref 0.0–2.0)
Bicarbonate: 32.9 mmol/L — ABNORMAL HIGH (ref 20.0–28.0)
Calcium, Ion: 1.08 mmol/L — ABNORMAL LOW (ref 1.15–1.40)
HCT: 48 % — ABNORMAL HIGH (ref 36.0–46.0)
Hemoglobin: 16.3 g/dL — ABNORMAL HIGH (ref 12.0–15.0)
O2 Saturation: 99 %
Potassium: 4.3 mmol/L (ref 3.5–5.1)
Sodium: 135 mmol/L (ref 135–145)
TCO2: 34 mmol/L — ABNORMAL HIGH (ref 22–32)
pCO2, Ven: 54.1 mmHg (ref 44.0–60.0)
pH, Ven: 7.391 (ref 7.250–7.430)
pO2, Ven: 148 mmHg — ABNORMAL HIGH (ref 32.0–45.0)

## 2019-09-01 LAB — BASIC METABOLIC PANEL
Anion gap: 11 (ref 5–15)
BUN: 11 mg/dL (ref 6–20)
CO2: 26 mmol/L (ref 22–32)
Calcium: 8.9 mg/dL (ref 8.9–10.3)
Chloride: 98 mmol/L (ref 98–111)
Creatinine, Ser: 0.95 mg/dL (ref 0.44–1.00)
GFR calc Af Amer: >60 mL/min (ref >60)
GFR calc non Af Amer: >60 mL/min (ref >60)
Glucose, Bld: 96 mg/dL (ref 70–99)
Potassium: 4.3 mmol/L (ref 3.5–5.1)
Sodium: 135 mmol/L (ref 135–145)

## 2019-09-01 LAB — SARS CORONAVIRUS 2 BY RT PCR (HOSPITAL ORDER, PERFORMED IN ~~LOC~~ HOSPITAL LAB): SARS Coronavirus 2: NEGATIVE

## 2019-09-01 MED ORDER — ALBUTEROL SULFATE HFA 108 (90 BASE) MCG/ACT IN AERS
2.0000 | INHALATION_SPRAY | Freq: Three times a day (TID) | RESPIRATORY_TRACT | Status: DC | PRN
  Administered 2019-09-01: 2 via RESPIRATORY_TRACT
  Filled 2019-09-01: qty 6.7

## 2019-09-01 MED ORDER — METHYLPREDNISOLONE SODIUM SUCC 125 MG IJ SOLR
125.0000 mg | Freq: Once | INTRAMUSCULAR | Status: AC
  Administered 2019-09-01: 23:00:00 125 mg via INTRAVENOUS
  Filled 2019-09-01: qty 2

## 2019-09-01 MED ORDER — IPRATROPIUM-ALBUTEROL 0.5-2.5 (3) MG/3ML IN SOLN
3.0000 mL | Freq: Three times a day (TID) | RESPIRATORY_TRACT | Status: DC | PRN
  Administered 2019-09-01: 6 mL via RESPIRATORY_TRACT
  Filled 2019-09-01: qty 3
  Filled 2019-09-01: qty 6

## 2019-09-01 NOTE — ED Provider Notes (Signed)
[facility] EMERGENCY DEPARTMENT Provider Note   CSN: [id number] Arrival date & time: 09/01/19  1907     History   Chief Complaint Wheezing  HPI Kim [name] is a 58 y.o. female with a history of obesity, COPD (reports he is not on oxygen at home), high cholesterol, presenting to emergency department with shortness of breath.  Patient reports that this started with a sinus infection approximately 4 days ago.  She says it developed into a bad cough.  She has subsequently been having worsening shortness of breath until today, when she finally called ambulance.  She reports that her daughter is at a similar "sinus infection" that her husband who lives with her also has a "sinus infection".  She denies any prior history of intubation.  She states she was told she is COPD.  She has not prescribed oxygen at home but she says "I need some".  She states she can typically walk across that length of the hospital much further before feeling winded.  Of note, on arrival in the ED, she was 85% on room air, improved to 98% on non-rebreather mask 15 L.     HPI  Past Medical History:  Diagnosis Date  . High cholesterol   . Hypertension     There are no active problems to display for this patient.   History reviewed. No pertinent surgical history.   OB History   No obstetric history on file.      Home Medications    Prior to Admission medications   Medication Sig Start Date End Date Taking? Authorizing Provider  amLODipine (NORVASC) 5 MG tablet Take 1 tablet (5 mg total) by mouth daily. 06/06/17   Mancel Bale, MD  ibuprofen (ADVIL,MOTRIN) 200 MG tablet Take 200 mg by mouth every 6 (six) hours as needed for moderate pain.    [provider]  pravastatin (PRAVACHOL) 20 MG tablet Take 20 mg by mouth daily. 07/02/18   [provider]    Family History History reviewed. No pertinent family history.  Social History Social History   Tobacco Use   . Smoking status: Current Every Day Smoker    Packs/day: 1.00    Types: Cigarettes  . Smokeless tobacco: Never Used  Substance Use Topics  . Alcohol use: Not Currently  . Drug use: Not Currently     Allergies   Patient has no known allergies.   Review of Systems Review of Systems  Constitutional: Positive for fatigue. Negative for chills and fever.  Respiratory: Positive for cough and shortness of breath.   Cardiovascular: Negative for chest pain and palpitations.  Gastrointestinal: Negative for abdominal pain and vomiting.  Skin: Negative for pallor and rash.  Neurological: Negative for syncope and light-headedness.  Psychiatric/Behavioral: Negative for agitation and confusion.  All other systems reviewed and are negative.    Physical Exam Updated Vital Signs BP (!) 159/117   Pulse 96   Temp 98.4 F (36.9 C) (Oral)   Resp (!) 23   Ht 5\' 6"  (1.676 m)   Wt 124.7 kg   SpO2 98%   BMI 44.37 kg/m   Physical Exam Vitals signs and nursing note reviewed.  Constitutional:      General: She is not in acute distress.    Appearance: She is well-developed. She is obese.  HENT:     Head: Normocephalic and atraumatic.  Eyes:     Conjunctiva/sclera: Conjunctivae normal.  Neck:     Musculoskeletal: Neck supple.  Cardiovascular:  Rate and Rhythm: Normal rate and regular rhythm.     Heart sounds: No murmur.  Pulmonary:     Effort: Pulmonary effort is normal.     Breath sounds: No rhonchi.     Comments: 85% on room air, improved on 15L nonrebreather to 98%, then weaned to 8L [place] at 93% Speaking in full sentences Some accessory muscle usage Diffuse bilateral inspiratory and expiratory wheezing Abdominal:     Palpations: Abdomen is soft.     Tenderness: There is no abdominal tenderness.  Skin:    General: Skin is warm and dry.  Neurological:     Mental Status: She is alert.      ED Treatments / Results  Labs (all labs ordered are listed, but only abnormal  results are displayed) Labs Reviewed  CBC WITH DIFFERENTIAL/PLATELET - Abnormal; Notable for the following components:      Result Value   WBC 12.3 (*)    RBC 5.13 (*)    Hemoglobin 15.4 (*)    HCT 50.1 (*)    Neutro Abs 8.6 (*)    Abs Immature Granulocytes 0.12 (*)    All other components within normal limits  POCT I-STAT EG7 - Abnormal; Notable for the following components:   pO2, Ven 148.0 (*)    Bicarbonate 32.9 (*)    TCO2 34 (*)    Acid-Base Excess 6.0 (*)    Calcium, Ion 1.08 (*)    HCT 48.0 (*)    Hemoglobin 16.3 (*)    All other components within normal limits  SARS CORONAVIRUS 2 BY RT PCR (HOSPITAL ORDER, PERFORMED IN [facility])  BASIC METABOLIC PANEL  BLOOD GAS, VENOUS    EKG None  Radiology Dg Chest Portable 1 View  Result Date: 09/01/2019 CLINICAL DATA:  Pt [facility], cough x3 days with diahreaa x4 days. States it is a sinus infection. 90% on Room air but refused nasal canula, will only tolerate 15L on NRB. Hx smoking, COPD, HTN, high cholesterol EXAM: PORTABLE CHEST 1 VIEW COMPARISON:  12/27/2017 FINDINGS: Cardiac silhouette is normal in size. No mediastinal or hilar masses. Lungs are hyperexpanded. There are prominent bronchovascular markings. Opacities noted at the anterior left lung base on the previous exam unchanged consistent with lingular scarring. No evidence of pneumonia or pulmonary edema. No pleural effusion or pneumothorax. Skeletal structures are grossly intact. IMPRESSION: 1. No acute cardiopulmonary disease. Electronically Signed   By: Amie Portlandavid  Ormond M.D.   On: 09/01/2019 20:09    Procedures Procedures (including critical care time)  Medications Ordered in ED Medications  albuterol (VENTOLIN HFA) 108 (90 Base) MCG/ACT inhaler 2 puff (2 puffs Inhalation Given 09/01/19 1953)  ipratropium-albuterol (DUONEB) 0.5-2.5 (3) MG/3ML nebulizer solution 3 mL (6 mLs Nebulization Given 09/01/19 2256)  methylPREDNISolone sodium succinate  (SOLU-MEDROL) 125 mg/2 mL injection 125 mg (125 mg Intravenous Given 09/01/19 2327)     Initial Impression / Assessment and Plan / ED Course  I have reviewed the triage vital signs and the nursing notes.  Pertinent labs & imaging results that were available during my care of the patient were reviewed by me and considered in my medical decision making (see chart for details).  58 yo female w/ hx of COPD and daily 1 ppd smoker presenting with diffuse wheezing and SOB worsening for the past 4 days.   Has sick family members at home, reporting they all have "sinus infections."  Suspect this is a viral illness.  COVID rapid test  was negative here.  CXR with no clear signs of PNA.  No fever - less concern for bacterial infection.  Do not suspect sepsis  No fluid in lungs to suggest CHF  Plan for breathing treatments, IV solumedrol, reassess  Clinical Course as of Sep 01 6  Sun Sep 01, 2019  2351 Covid negative   [MT]  2351 She is now wheezing more difficusely after initial duonebs, expect this is her airway opening up, will need admission given her presenting hypoxia   [MT]  Mon Sep 02, 2019  0004 Admitted to hospitalist   [MT]    Clinical Course User Index [MT] Wyvonnia Dusky, MD     Final Clinical Impressions(s) / ED Diagnoses   Final diagnoses:  COPD exacerbation [facility])    ED Discharge Orders    None       Wyvonnia Dusky, MD 09/02/19 [phone]

## 2019-09-01 NOTE — ED Triage Notes (Signed)
Pt [facility] for [facility], cough x3 days with diahreaa x4 days. States it is a sinus infection. 90% on Room air but refused nasal canula, will only tolerate 15L on NRB. Hx smoking, COPD, HTN, high cholesterol

## 2019-09-01 NOTE — ED Notes (Signed)
Pt placed on purewick 

## 2019-09-02 ENCOUNTER — Observation Stay (HOSPITAL_COMMUNITY): Payer: Medicaid Other

## 2019-09-02 ENCOUNTER — Encounter (HOSPITAL_COMMUNITY): Payer: Self-pay | Admitting: Internal Medicine

## 2019-09-02 DIAGNOSIS — E7800 Pure hypercholesterolemia, unspecified: Secondary | ICD-10-CM | POA: Diagnosis present

## 2019-09-02 DIAGNOSIS — F17210 Nicotine dependence, cigarettes, uncomplicated: Secondary | ICD-10-CM | POA: Diagnosis present

## 2019-09-02 DIAGNOSIS — J9601 Acute respiratory failure with hypoxia: Secondary | ICD-10-CM | POA: Diagnosis not present

## 2019-09-02 DIAGNOSIS — Z6841 Body Mass Index (BMI) 40.0 and over, adult: Secondary | ICD-10-CM | POA: Diagnosis not present

## 2019-09-02 DIAGNOSIS — J441 Chronic obstructive pulmonary disease with (acute) exacerbation: Secondary | ICD-10-CM | POA: Diagnosis present

## 2019-09-02 DIAGNOSIS — E785 Hyperlipidemia, unspecified: Secondary | ICD-10-CM | POA: Diagnosis present

## 2019-09-02 DIAGNOSIS — M419 Scoliosis, unspecified: Secondary | ICD-10-CM | POA: Diagnosis present

## 2019-09-02 DIAGNOSIS — R739 Hyperglycemia, unspecified: Secondary | ICD-10-CM | POA: Diagnosis not present

## 2019-09-02 DIAGNOSIS — Z20828 Contact with and (suspected) exposure to other viral communicable diseases: Secondary | ICD-10-CM | POA: Diagnosis present

## 2019-09-02 DIAGNOSIS — I10 Essential (primary) hypertension: Secondary | ICD-10-CM | POA: Diagnosis present

## 2019-09-02 DIAGNOSIS — R0602 Shortness of breath: Secondary | ICD-10-CM | POA: Diagnosis present

## 2019-09-02 DIAGNOSIS — B9789 Other viral agents as the cause of diseases classified elsewhere: Secondary | ICD-10-CM | POA: Diagnosis not present

## 2019-09-02 DIAGNOSIS — G4733 Obstructive sleep apnea (adult) (pediatric): Secondary | ICD-10-CM | POA: Diagnosis present

## 2019-09-02 DIAGNOSIS — J810 Acute pulmonary edema: Secondary | ICD-10-CM | POA: Diagnosis not present

## 2019-09-02 DIAGNOSIS — J449 Chronic obstructive pulmonary disease, unspecified: Secondary | ICD-10-CM

## 2019-09-02 DIAGNOSIS — J9621 Acute and chronic respiratory failure with hypoxia: Secondary | ICD-10-CM | POA: Diagnosis present

## 2019-09-02 DIAGNOSIS — Z8249 Family history of ischemic heart disease and other diseases of the circulatory system: Secondary | ICD-10-CM | POA: Diagnosis not present

## 2019-09-02 LAB — CBC
HCT: 49 % — ABNORMAL HIGH (ref 36.0–46.0)
Hemoglobin: 15.1 g/dL — ABNORMAL HIGH (ref 12.0–15.0)
MCH: 30.1 pg (ref 26.0–34.0)
MCHC: 30.8 g/dL (ref 30.0–36.0)
MCV: 97.8 fL (ref 80.0–100.0)
Platelets: 308 10*3/uL (ref 150–400)
RBC: 5.01 MIL/uL (ref 3.87–5.11)
RDW: 15.1 % (ref 11.5–15.5)
WBC: 12.2 10*3/uL — ABNORMAL HIGH (ref 4.0–10.5)
nRBC: 0 % (ref 0.0–0.2)

## 2019-09-02 LAB — BASIC METABOLIC PANEL
Anion gap: 10 (ref 5–15)
BUN: 10 mg/dL (ref 6–20)
CO2: 29 mmol/L (ref 22–32)
Calcium: 9.1 mg/dL (ref 8.9–10.3)
Chloride: 98 mmol/L (ref 98–111)
Creatinine, Ser: 1 mg/dL (ref 0.44–1.00)
GFR calc Af Amer: >60 mL/min (ref >60)
GFR calc non Af Amer: >60 mL/min (ref >60)
Glucose, Bld: 127 mg/dL — ABNORMAL HIGH (ref 70–99)
Potassium: 3.9 mmol/L (ref 3.5–5.1)
Sodium: 137 mmol/L (ref 135–145)

## 2019-09-02 LAB — HIV ANTIBODY (ROUTINE TESTING W REFLEX): HIV Screen 4th Generation wRfx: NONREACTIVE

## 2019-09-02 LAB — RESPIRATORY PANEL BY PCR

## 2019-09-02 LAB — POCT I-STAT 7, (LYTES, BLD GAS, ICA,H+H)
Acid-Base Excess: 3 mmol/L — ABNORMAL HIGH (ref 0.0–2.0)
Bicarbonate: 32.9 mmol/L — ABNORMAL HIGH (ref 20.0–28.0)
Calcium, Ion: 1.23 mmol/L (ref 1.15–1.40)
HCT: 44 % (ref 36.0–46.0)
Hemoglobin: 15 g/dL (ref 12.0–15.0)
O2 Saturation: 95 %
Patient temperature: 98.3
Potassium: 3.8 mmol/L (ref 3.5–5.1)
Sodium: 137 mmol/L (ref 135–145)
TCO2: 35 mmol/L — ABNORMAL HIGH (ref 22–32)
pCO2 arterial: 71.3 mmHg (ref 32.0–48.0)
pH, Arterial: 7.271 — ABNORMAL LOW (ref 7.350–7.450)
pO2, Arterial: 90 mmHg (ref 83.0–108.0)

## 2019-09-02 LAB — TROPONIN I (HIGH SENSITIVITY): Troponin I (High Sensitivity): 5 ng/L (ref <18)

## 2019-09-02 LAB — BRAIN NATRIURETIC PEPTIDE: B Natriuretic Peptide: 64.7 pg/mL (ref 0.0–100.0)

## 2019-09-02 LAB — CREATININE, SERUM
Creatinine, Ser: 0.99 mg/dL (ref 0.44–1.00)
GFR calc Af Amer: >60 mL/min (ref >60)
GFR calc non Af Amer: >60 mL/min (ref >60)

## 2019-09-02 LAB — MRSA PCR SCREENING: MRSA by PCR: NEGATIVE

## 2019-09-02 LAB — TSH: TSH: 3.029 u[IU]/mL (ref 0.350–4.500)

## 2019-09-02 MED ORDER — IPRATROPIUM BROMIDE 0.02 % IN SOLN
0.5000 mg | RESPIRATORY_TRACT | Status: DC
  Administered 2019-09-02 (×2): 0.5 mg via RESPIRATORY_TRACT
  Filled 2019-09-02 (×2): qty 2.5

## 2019-09-02 MED ORDER — IOHEXOL 350 MG/ML SOLN
75.0000 mL | Freq: Once | INTRAVENOUS | Status: AC | PRN
  Administered 2019-09-02: 75 mL via INTRAVENOUS

## 2019-09-02 MED ORDER — ASPIRIN EC 81 MG PO TBEC
81.0000 mg | DELAYED_RELEASE_TABLET | Freq: Every day | ORAL | Status: DC
  Administered 2019-09-02 – 2019-09-13 (×12): 81 mg via ORAL
  Filled 2019-09-02 (×12): qty 1

## 2019-09-02 MED ORDER — NICOTINE 14 MG/24HR TD PT24
14.0000 mg | MEDICATED_PATCH | Freq: Every day | TRANSDERMAL | Status: DC
  Administered 2019-09-02 – 2019-09-13 (×12): 14 mg via TRANSDERMAL
  Filled 2019-09-02 (×12): qty 1

## 2019-09-02 MED ORDER — ENOXAPARIN SODIUM 40 MG/0.4ML ~~LOC~~ SOLN
40.0000 mg | SUBCUTANEOUS | Status: DC
  Administered 2019-09-02 – 2019-09-13 (×12): 40 mg via SUBCUTANEOUS
  Filled 2019-09-02 (×12): qty 0.4

## 2019-09-02 MED ORDER — ACETAMINOPHEN 650 MG RE SUPP: 650.0000 mg | Freq: Four times a day (QID) | RECTAL | Status: DC | PRN

## 2019-09-02 MED ORDER — GUAIFENESIN-DM 100-10 MG/5ML PO SYRP
5.0000 mL | ORAL_SOLUTION | ORAL | Status: DC | PRN
  Administered 2019-09-03: 5 mL via ORAL
  Filled 2019-09-02 (×2): qty 5

## 2019-09-02 MED ORDER — PRAVASTATIN SODIUM 10 MG PO TABS
20.0000 mg | ORAL_TABLET | Freq: Every day | ORAL | Status: DC
  Administered 2019-09-02 – 2019-09-13 (×12): 20 mg via ORAL
  Filled 2019-09-02 (×12): qty 2

## 2019-09-02 MED ORDER — ALBUTEROL SULFATE (2.5 MG/3ML) 0.083% IN NEBU
2.5000 mg | INHALATION_SOLUTION | RESPIRATORY_TRACT | Status: DC
  Administered 2019-09-02 (×2): 2.5 mg via RESPIRATORY_TRACT
  Filled 2019-09-02 (×2): qty 3

## 2019-09-02 MED ORDER — SODIUM CHLORIDE 0.9 % IV SOLN
500.0000 mg | INTRAVENOUS | Status: AC
  Administered 2019-09-02 – 2019-09-08 (×7): 500 mg via INTRAVENOUS
  Filled 2019-09-02 (×8): qty 500

## 2019-09-02 MED ORDER — IPRATROPIUM-ALBUTEROL 0.5-2.5 (3) MG/3ML IN SOLN
RESPIRATORY_TRACT | Status: AC
  Filled 2019-09-02: qty 3

## 2019-09-02 MED ORDER — ONDANSETRON HCL 4 MG PO TABS: 4.0000 mg | ORAL_TABLET | Freq: Four times a day (QID) | ORAL | Status: DC | PRN

## 2019-09-02 MED ORDER — ONDANSETRON HCL 4 MG/2ML IJ SOLN
4.0000 mg | Freq: Four times a day (QID) | INTRAMUSCULAR | Status: DC | PRN
  Filled 2019-09-02: qty 2

## 2019-09-02 MED ORDER — ALBUTEROL SULFATE (2.5 MG/3ML) 0.083% IN NEBU
2.5000 mg | INHALATION_SOLUTION | RESPIRATORY_TRACT | Status: DC | PRN
  Administered 2019-09-02 – 2019-09-09 (×2): 2.5 mg via RESPIRATORY_TRACT
  Filled 2019-09-02 (×2): qty 3

## 2019-09-02 MED ORDER — AMLODIPINE BESYLATE 5 MG PO TABS
5.0000 mg | ORAL_TABLET | Freq: Every day | ORAL | Status: DC
  Administered 2019-09-02 – 2019-09-13 (×12): 5 mg via ORAL
  Filled 2019-09-02 (×13): qty 1

## 2019-09-02 MED ORDER — PHENOL 1.4 % MT LIQD: 1.0000 | OROMUCOSAL | Status: DC | PRN

## 2019-09-02 MED ORDER — IPRATROPIUM-ALBUTEROL 0.5-2.5 (3) MG/3ML IN SOLN
3.0000 mL | Freq: Three times a day (TID) | RESPIRATORY_TRACT | Status: DC
  Administered 2019-09-02 – 2019-09-07 (×17): 3 mL via RESPIRATORY_TRACT
  Filled 2019-09-02 (×16): qty 3

## 2019-09-02 MED ORDER — ACETAMINOPHEN 325 MG PO TABS
650.0000 mg | ORAL_TABLET | Freq: Four times a day (QID) | ORAL | Status: DC | PRN
  Administered 2019-09-05: 650 mg via ORAL
  Filled 2019-09-02: qty 2

## 2019-09-02 MED ORDER — METHYLPREDNISOLONE SODIUM SUCC 40 MG IJ SOLR
40.0000 mg | Freq: Two times a day (BID) | INTRAMUSCULAR | Status: DC
  Administered 2019-09-02 – 2019-09-05 (×7): 40 mg via INTRAVENOUS
  Filled 2019-09-02 (×7): qty 1

## 2019-09-02 MED ORDER — BENZONATATE 100 MG PO CAPS
100.0000 mg | ORAL_CAPSULE | Freq: Two times a day (BID) | ORAL | Status: DC | PRN
  Administered 2019-09-03 – 2019-09-05 (×2): 100 mg via ORAL
  Filled 2019-09-02 (×3): qty 1

## 2019-09-02 MED ORDER — ALUM & MAG HYDROXIDE-SIMETH 200-200-20 MG/5ML PO SUSP
30.0000 mL | ORAL | Status: DC | PRN
  Filled 2019-09-02 (×2): qty 30

## 2019-09-02 MED ORDER — BUDESONIDE 0.25 MG/2ML IN SUSP
0.2500 mg | Freq: Two times a day (BID) | RESPIRATORY_TRACT | Status: DC
  Administered 2019-09-02 – 2019-09-04 (×6): 0.25 mg via RESPIRATORY_TRACT
  Filled 2019-09-02 (×7): qty 2

## 2019-09-02 NOTE — ED Notes (Signed)
Breakfast Ordered 

## 2019-09-02 NOTE — ED Notes (Signed)
Patient transported to CT 

## 2019-09-02 NOTE — ED Notes (Signed)
Pt is on the phone and having a breathing treatment.

## 2019-09-02 NOTE — Consult Note (Signed)
NAME:  Val EagleConnie L Neault, MRN:  [id number], DOB:  1960/11/05, LOS: 0 ADMISSION DATE:  09/01/2019, CONSULTATION DATE:  09/02/19 REFERRING MD:  Toniann FailKakrakandy - [facility] , CHIEF COMPLAINT:  Hypoxia   Brief History   58 yo F with CC SOB. Hx tobacco abuse, COPD. PCCM consulted for hypoxia   COVID-19 negative  History of present illness   58 yo F PMH Tobacco abuse, HTN, HLD, presents to ED 11/1 with SOB. SOB began 4 days prior to presentation and have associated s/sx of wheezing, productive cough. Denies chest pain, fever, chills, sick contacts.   In ED, patient required aid of NRB for hypoxia. COVID-19 negative. CXR without evidence of acute cardiopulmonary disease. Treatment was initiated for suspected acute COPD exacerbation, including  Azithromycin nebulizers and steroids.   PCCM consulted given oxygen requirement  Past Medical History  Tobacco abuse HTN HLD   Significant Hospital Events   11/1> presents to ED with worsening SOB  11/2> admitted to hospitalist service. PCCM consulted.   Consults:  PCCM  Procedures:    Significant Diagnostic Tests:  11/1 CXR> hyper-expanded lungs. L basilar opacity felt to be consistent with lingular scarring   11/2 CTA chest> motion artifact present, no clear evidence of PE and possible filling defects are felt to be artifact related. Low lung volumes, bibasilar atelectasis.  Hepatic steatosis  Micro Data:  11/1 SARS CoV2 > neg 11/2 RVP >>>  11/2 sputum cx >>>   Antimicrobials:  Azithromycin 11/2>>   Interim history/subjective:  O2 requirements have been weaned this morning is now on 10L States that the food needs more salt and chuckles   Objective   Blood pressure (!) 152/79, pulse 95, temperature 98.2 F (36.8 C), temperature source Oral, resp. rate 20, height 5\' 6"  (1.676 m), weight 116 kg, SpO2 91 %.        Intake/Output Summary (Last 24 hours) at 09/02/2019 0922 Last data filed at 09/02/2019 0745 Gross per 24 hour  Intake 240 ml   Output 450 ml  Net -210 ml   Filed Weights   09/01/19 1915 09/02/19 [id number]  Weight: 124.7 kg 116 kg    Examination: General: Chronically ill appearing adult F, seated in bed, NAD on 10LNC HENT: NCAT. Short neck with some redundant adipose tissue . Pink mmm. Anicteric sclera Lungs: Inspiratory wheeze posterior bilateral upper lobes. No crackles or rhonchi. Diminished bibasilar sounds.  Cardiovascular: RRR s1s2 no rgm. Cap refill < 3 seconds  Abdomen: Obese soft, round, ndnt. + bowel sounds  Extremities: R hand nailbeds with orange-tinged stains. No clubbing or cyanosis  Neuro: Awake, slightly drowsy oriented x 3. Following commands  GU: defer  Skin: Pale, clean, dry skin.  Psych: engaged, appropriate for age and situation   Resolved Hospital Problem list    Assessment & Plan:   Acute on chronic respiratory failure with hypoxia and hypercarbia  -possible AECOPD (COPD likely  underlying condition, no diagnosis of this in hx)  -possible undiagnosed OSA vs OHS (patient and husband endorse hx consistent with apnea vs hypoventilation)  -COVID-19 negative  -BNP 64.7 -CXR suggestive of lingular scarring -CTA chest without evidence of PE, however does reveal bibasilar atelectasis P -Adjsut oxygen support for SpO2 88-92 -Can consider qHS BiPAP  -ABG 11/3 AM  -Continue azithromycin -Continue systemic steroids -Continue pulmicort, duoneb, PRN albuterol  -Check RVP, sputum cx -IS for component of atelectasis  -Patient does not follow regularly with physicians; feel that she would benefit from OP pulmonary/sleep  Tobacco Abuse - 40+ years of tobacco use, 1-2 ppd  P -Tobacco cessation counseling  -nicotine patch qD   Hyperglycemia -no known Hx DM -acute hyperglycemia possibly in setting of steroids P -Trend, may need additional of SSI acutely   HTN HLD -per primary    Best practice:  Diet: Heart healthy  Pain/Anxiety/Delirium protocol (if indicated): na VAP protocol  (if indicated): na  DVT prophylaxis: lovenox  GI prophylaxis: na Glucose control: monitor  Mobility: up with assistance  Code Status: Full  Family Communication: patient and husband updated at bedside  Disposition: Progressive   Labs   CBC: Recent Labs  Lab 09/01/19 1947 09/01/19 1958 09/02/19 0046 09/02/19 0230  WBC 12.3*  --   --  12.2*  NEUTROABS 8.6*  --   --   --   HGB 15.4* 16.3* 15.0 15.1*  HCT 50.1* 48.0* 44.0 49.0*  MCV 97.7  --   --  97.8  PLT 314  --   --  308    Basic Metabolic Panel: Recent Labs  Lab 09/01/19 1947 09/01/19 1958 09/02/19 0046 09/02/19 0230  NA 135 135 137 137  K 4.3 4.3 3.8 3.9  CL 98  --   --  98  CO2 26  --   --  29  GLUCOSE 96  --   --  127*  BUN 11  --   --  10  CREATININE 0.95  --   --  0.99  1.00  CALCIUM 8.9  --   --  9.1   GFR: Estimated Creatinine Clearance: 79.4 mL/min (by C-G formula based on SCr of 1 mg/dL). Recent Labs  Lab 09/01/19 1947 09/02/19 0230  WBC 12.3* 12.2*    Liver Function Tests: No results for input(s): AST, ALT, ALKPHOS, BILITOT, PROT, ALBUMIN in the last 168 hours. No results for input(s): LIPASE, AMYLASE in the last 168 hours. No results for input(s): AMMONIA in the last 168 hours.  ABG    Component Value Date/Time   PHART 7.271 (L) 09/02/2019 0046   PCO2ART 71.3 (HH) 09/02/2019 0046   PO2ART 90.0 09/02/2019 0046   HCO3 32.9 (H) 09/02/2019 0046   TCO2 35 (H) 09/02/2019 0046   O2SAT 95.0 09/02/2019 0046     Coagulation Profile: No results for input(s): INR, PROTIME in the last 168 hours.  Cardiac Enzymes: No results for input(s): CKTOTAL, CKMB, CKMBINDEX, TROPONINI in the last 168 hours.  HbA1C: No results found for: HGBA1C  CBG: No results for input(s): GLUCAP in the last 168 hours.  Review of Systems:   As per HPI  Past Medical History  She,  has a past medical history of High cholesterol and Hypertension.   Surgical History   History reviewed. No pertinent surgical  history.   Social History   reports that she has been smoking cigarettes. She has been smoking about 1.00 pack per day. She has never used smokeless tobacco. She reports previous alcohol use. She reports previous drug use.   Family History   Her family history includes CAD in her mother.   Allergies No Known Allergies   Home Medications  Prior to Admission medications   Medication Sig Start Date End Date Taking? Authorizing Provider  amLODipine (NORVASC) 5 MG tablet Take 1 tablet (5 mg total) by mouth daily. 06/06/17   Mancel Bale, MD  ibuprofen (ADVIL,MOTRIN) 200 MG tablet Take 200 mg by mouth every 6 (six) hours as needed for moderate pain.    [provider]  pravastatin (PRAVACHOL) 20 MG tablet Take 20 mg by mouth daily. 07/02/18   [provider]     Eliseo Gum MSN, AGACNP-BC [place] [phone] If no answer, [phone] 09/02/2019, 9:22 AM

## 2019-09-02 NOTE — ED Notes (Signed)
SDU 

## 2019-09-02 NOTE — Progress Notes (Signed)
PROGRESS NOTE  Kim [name] KGY:[id number] DOB: 01-27-1961 DOA: 09/01/2019 PCP: Kim Mowers, FNP  HPI/Recap of past 24 hours: HPI from Dr Kim [name] is a 58 y.o. female with history of tobacco abuse, hypertension, hyperlipidemia presents to the ER because of worsening shortness of breath.  Patient states she has been short of breath the last 4 to 5 days.  Has been having wheezing and productive cough, denies any fever/chills, has been having chest tightness, no L sided chest pain. Given these worsening symptoms patient came to the ER. In the ED, patient was hypoxic requiring almost 100% nonrebreather.  Chest x-ray does not show anything acute.  EKG shows normal sinus rhythm. Pt was placed on nebulizer treatment and steroids. Admitted for acute respiratory failure secondary to COPD exacerbation. COVID-19 was negative.   Today, met patient eating breakfast, still short of breath, but denies it significantly worsening, still coughing, denies any chest pain, fever/chills, abdominal pain, nausea/vomiting.  Assessment/Plan: Principal Problem:   Acute respiratory failure with hypoxia (HCC) Active Problems:   COPD exacerbation (HCC)   Essential hypertension   HLD (hyperlipidemia)  Acute hyperbaric, hypoxic respiratory failure likely 2/2 COPD exacerbation Currently weaned off of a nonrebreather, currently on 10 L of oxygen nasal cannula Afebrile, with leukocytosis (on steroids) ABG showed pH of 7.27, PCO2 71.3, PO2 90, bicarb 32.9 BNP 64.7 Troponin 5 Respiratory viral panel pending Chest x-ray unremarkable CTA chest showed no PE, although noted respiratory motion artifact, low lung volumes with bibasilar atelectasis PCCM consulted, appreciate recs.  Suspect underlying OSA/OHS.  Sleep study as an outpatient. ??  Interstitial lung disease, consider HRCT as an outpatient Continue steroids, azithromycin, inhalers/nebulizers Supplemental oxygen as needed Monitor closely  on a progressive unit PT  Hypertension Stable Continue Norvasc  Hyperlipidemia Continue statins  Tobacco abuse Advised to quit Nicotine patch ordered  Morbid obesity Lifestyle modification advised        Malnutrition Type:      Malnutrition Characteristics:      Nutrition Interventions:       Estimated body mass index is 41.28 kg/m as calculated from the following:   Height as of this encounter: _0  (1.676 m).   Weight as of this encounter: 116 kg.     Code Status: Full  Family Communication: None at bedside  Disposition Plan: To be determined   Consultants:  PCCM  Procedures:  None  Antimicrobials:  Azithromycin  DVT prophylaxis: Lovenox   Objective: Vitals:   09/02/19 0745 09/02/19 0753 09/02/19 0835 09/02/19 1119  BP:   (!) 152/79 123/79  Pulse: 95   91  Resp: 20   16  Temp:    (!) 97.5 F (36.4 C)  TempSrc:    Oral  SpO2: 95% 91%  94%  Weight:      Height:        Intake/Output Summary (Last 24 hours) at 09/02/2019 1259 Last data filed at 09/02/2019 1215 Gross per 24 hour  Intake 488.99 ml  Output 750 ml  Net -261.01 ml   Filed Weights   09/01/19 1915 09/02/19 0263  Weight: 124.7 kg 116 kg    Exam:  General: NAD, acutely ill-appearing  Cardiovascular: S1, S2 present  Respiratory:  Diminished breath sounds bilaterally, no wheezing heard  Abdomen: Soft, nontender, nondistended, bowel sounds present  Musculoskeletal: No bilateral pedal edema noted  Skin: Normal  Psychiatry: Normal mood   Data Reviewed: CBC: Recent Labs  Lab 09/01/19 1947 09/01/19 1958 09/02/19 0046 09/02/19  0230  WBC 12.3*  --   --  12.2*  NEUTROABS 8.6*  --   --   --   HGB 15.4* 16.3* 15.0 15.1*  HCT 50.1* 48.0* 44.0 49.0*  MCV 97.7  --   --  97.8  PLT 314  --   --  017   Basic Metabolic Panel: Recent Labs  Lab 09/01/19 1947 09/01/19 1958 09/02/19 0046 09/02/19 0230  NA 135 135 137 137  K 4.3 4.3 3.8 3.9  CL 98  --    --  98  CO2 26  --   --  29  GLUCOSE 96  --   --  127*  BUN 11  --   --  10  CREATININE 0.95  --   --  0.99  1.00  CALCIUM 8.9  --   --  9.1   GFR: Estimated Creatinine Clearance: 79.4 mL/min (by C-G formula based on SCr of 1 mg/dL). Liver Function Tests: No results for input(s): AST, ALT, ALKPHOS, BILITOT, PROT, ALBUMIN in the last 168 hours. No results for input(s): LIPASE, AMYLASE in the last 168 hours. No results for input(s): AMMONIA in the last 168 hours. Coagulation Profile: No results for input(s): INR, PROTIME in the last 168 hours. Cardiac Enzymes: No results for input(s): CKTOTAL, CKMB, CKMBINDEX, TROPONINI in the last 168 hours. BNP (last 3 results) No results for input(s): PROBNP in the last 8760 hours. HbA1C: No results for input(s): HGBA1C in the last 72 hours. CBG: No results for input(s): GLUCAP in the last 168 hours. Lipid Profile: No results for input(s): CHOL, HDL, LDLCALC, TRIG, CHOLHDL, LDLDIRECT in the last 72 hours. Thyroid Function Tests: Recent Labs    09/02/19 0230  TSH 3.029   Anemia Panel: No results for input(s): VITAMINB12, FOLATE, FERRITIN, TIBC, IRON, RETICCTPCT in the last 72 hours. Urine analysis: No results found for: COLORURINE, APPEARANCEUR, LABSPEC, [place], GLUCOSEU, HGBUR, BILIRUBINUR, KETONESUR, PROTEINUR, UROBILINOGEN, NITRITE, LEUKOCYTESUR Sepsis Labs: _0 (procalcitonin:4,lacticidven:4)  ) Recent Results (from the past 240 hour(s))  SARS Coronavirus 2 by RT PCR (hospital order, performed in [facility] hospital lab) Nasopharyngeal Nasopharyngeal Swab     Status: None   Collection Time: 09/01/19  7:46 PM   Specimen: Nasopharyngeal Swab  Result Value Ref Range Status   SARS Coronavirus 2 NEGATIVE NEGATIVE Final    Comment: (NOTE) If result is NEGATIVE SARS-CoV-2 target nucleic acids are NOT DETECTED. The SARS-CoV-2 RNA is generally detectable in upper and lower  respiratory specimens during the acute phase of  infection. The lowest  concentration of SARS-CoV-2 viral copies this assay can detect is 250  copies / mL. A negative result does not preclude SARS-CoV-2 infection  and should not be used as the sole basis for treatment or other  patient management decisions.  A negative result may occur with  improper specimen collection / handling, submission of specimen other  than nasopharyngeal swab, presence of viral mutation(s) within the  areas targeted by this assay, and inadequate number of viral copies  (<250 copies / mL). A negative result must be combined with clinical  observations, patient history, and epidemiological information. If result is POSITIVE SARS-CoV-2 target nucleic acids are DETECTED. The SARS-CoV-2 RNA is generally detectable in upper and lower  respiratory specimens dur ing the acute phase of infection.  Positive  results are indicative of active infection with SARS-CoV-2.  Clinical  correlation with patient history and other diagnostic information is  necessary to determine patient infection status.  Positive results do  not rule out bacterial infection or co-infection with other viruses. If result is PRESUMPTIVE POSTIVE SARS-CoV-2 nucleic acids MAY BE PRESENT.   A presumptive positive result was obtained on the submitted specimen  and confirmed on repeat testing.  While 2019 novel coronavirus  (SARS-CoV-2) nucleic acids may be present in the submitted sample  additional confirmatory testing may be necessary for epidemiological  and / or clinical management purposes  to differentiate between  SARS-CoV-2 and other Sarbecovirus currently known to infect humans.  If clinically indicated additional testing with an alternate test  methodology [phone]) is advised. The SARS-CoV-2 RNA is generally  detectable in upper and lower respiratory sp ecimens during the acute  phase of infection. The expected result is Negative. Fact Sheet for Patients:   StrictlyIdeas.no Fact Sheet for Healthcare Providers: BankingDealers.co.za This test is not yet approved or cleared by the [place] FDA and has been authorized for detection and/or diagnosis of SARS-CoV-2 by FDA under an Emergency Use Authorization (EUA).  This EUA will remain in effect (meaning this test can be used) for the duration of the COVID-19 declaration under Section 564(b)(1) of the Act, 21 U.S.C. section 360bbb-3(b)(1), unless the authorization is terminated or revoked sooner. Performed at [facility], [place] [address] [place], [place] [address]   MRSA PCR Screening     Status: None   Collection Time: 09/02/19  7:00 AM   Specimen: Nasopharyngeal  Result Value Ref Range Status   MRSA by PCR NEGATIVE NEGATIVE Final    Comment:        The GeneXpert MRSA Assay (FDA approved for NASAL specimens only), is one component of a comprehensive MRSA colonization surveillance program. It is not intended to diagnose MRSA infection nor to guide or monitor treatment for MRSA infections. Performed at [facility], [place] [address], [name], [place] [address]       Studies: Ct Angio Chest Pe W Or Wo Contrast  Result Date: 09/02/2019 CLINICAL DATA:  Shortness of breath EXAM: CT ANGIOGRAPHY CHEST WITH CONTRAST TECHNIQUE: Multidetector CT imaging of the chest was performed using the standard protocol during bolus administration of intravenous contrast. Multiplanar CT image reconstructions and MIPs were obtained to evaluate the vascular anatomy. CONTRAST:  80m OMNIPAQUE IOHEXOL 350 MG/ML SOLN COMPARISON:  None. FINDINGS: Cardiovascular: Evaluation is limited by respiratory motion artifact.There are a few possible filling defects within segmental and subsegmental branches bilaterally, however these are favored to be secondary to artifact as opposed to true acute pulmonary emboli. There are atherosclerotic changes of the thoracic  aorta and coronary arteries. Mediastinum/Nodes: --No mediastinal or hilar lymphadenopathy. --No axillary lymphadenopathy. --No supraclavicular lymphadenopathy. --Normal thyroid gland. --The esophagus is unremarkable Lungs/Pleura: The lung volumes are low. There is atelectasis at the lung bases bilaterally. There is no large pleural effusion. No pneumothorax. Upper Abdomen: There is hepatic steatosis. Musculoskeletal: No chest wall abnormality. No acute or significant osseous findings. Review of the MIP images confirms the above findings. IMPRESSION: 1. Evaluation is limited by respiratory motion artifact. Given this limitation, no definite pulmonary embolism was identified. 2. Low lung volumes with bibasilar atelectasis. 3. Hepatic steatosis. Aortic Atherosclerosis (ICD10-I70.0).1 Electronically Signed   By: CConstance HolsterM.D.   On: 09/02/2019 05:13   Dg Chest Portable 1 View  Result Date: 09/01/2019 CLINICAL DATA:  Pt BIB by [facility] for SOB, cough x3 days with diahreaa x4 days. States it is a sinus infection. 90% on Room air but refused nasal canula, will only tolerate 15L on  NRB. Hx smoking, COPD, HTN, high cholesterol EXAM: PORTABLE CHEST 1 VIEW COMPARISON:  12/27/2017 FINDINGS: Cardiac silhouette is normal in size. No mediastinal or hilar masses. Lungs are hyperexpanded. There are prominent bronchovascular markings. Opacities noted at the anterior left lung base on the previous exam unchanged consistent with lingular scarring. No evidence of pneumonia or pulmonary edema. No pleural effusion or pneumothorax. Skeletal structures are grossly intact. IMPRESSION: 1. No acute cardiopulmonary disease. Electronically Signed   By: Lajean Manes M.D.   On: 09/01/2019 20:09    Scheduled Meds: . amLODipine  5 mg Oral Daily  . aspirin EC  81 mg Oral Daily  . budesonide (PULMICORT) nebulizer solution  0.25 mg Nebulization BID  . enoxaparin (LOVENOX) injection  40 mg Subcutaneous Q24H  . ipratropium-albuterol  3  mL Nebulization TID  . ipratropium-albuterol      . methylPREDNISolone (SOLU-MEDROL) injection  40 mg Intravenous Q12H  . nicotine  14 mg Transdermal Daily  . pravastatin  20 mg Oral Daily    Continuous Infusions: . azithromycin 500 mg (09/02/19 0831)     LOS: 0 days     Alma Friendly, MD [facility]  If 7PM-7AM, please contact night-coverage [email or web address] 09/02/2019, 12:59 PM

## 2019-09-02 NOTE — H&P (Signed)
History and Physical    Kim [name] ZSW:[id number] DOB: 15-Jun-1961 DOA: 09/01/2019  PCP: Nechama Guard, FNP  Patient coming from: Home.  Chief Complaint: Shortness of breath.  HPI: Kim [name] is a 58 y.o. female with history of tobacco abuse, hypertension, hyperlipidemia presents to the ER because of worsening shortness of breath.  Patient states she has been short of breath the last 4 to 5 days.  Has been having wheezing and productive cough denies any fever chills has been having chest tightness no definite chest pain.  Given these worsening symptoms patient came to the ER.  ED Course: In the ER patient was hypoxic requiring almost 100% nonrebreather.  Chest x-ray does not show anything acute.  EKG shows normal sinus rhythm.  Patient is able to complete sentences on talking.  Patient on exam is having bilateral expiratory wheeze and was placed on nebulizer treatment and steroids and admitted for acute respiratory failure secondary to COPD exacerbation.  Labs show COVID-19 was negative.  Creatinine 0.9 WBC 12.3 hemoglobin 15.4 platelets 314.  Review of Systems: As per HPI, rest all negative.   Past Medical History:  Diagnosis Date   High cholesterol    Hypertension     History reviewed. No pertinent surgical history.   reports that she has been smoking cigarettes. She has been smoking about 1.00 pack per day. She has never used smokeless tobacco. She reports previous alcohol use. She reports previous drug use.  No Known Allergies  Family History  Problem Relation Age of Onset   CAD Mother     Prior to Admission medications   Medication Sig Start Date End Date Taking? Authorizing Provider  amLODipine (NORVASC) 5 MG tablet Take 1 tablet (5 mg total) by mouth daily. 06/06/17   Mancel Bale, MD  ibuprofen (ADVIL,MOTRIN) 200 MG tablet Take 200 mg by mouth every 6 (six) hours as needed for moderate pain.    [provider]  pravastatin (PRAVACHOL) 20 MG  tablet Take 20 mg by mouth daily. 07/02/18   [provider]    Physical Exam: Constitutional: Moderately built and nourished. Vitals:   09/01/19 2315 09/01/19 2322 09/01/19 2330 09/01/19 2345  BP: 103/68 103/68 (!) 129/45 (!) 159/117  Pulse: 78 88 86 96  Resp: 20 20 (!) 23 (!) 23  Temp:  98.4 F (36.9 C)    TempSrc:  Oral    SpO2: 99% 100% 100% 98%  Weight:      Height:       Eyes: Anicteric no pallor. ENMT: No discharge from the ears eyes nose or mouth. Neck: No masses.  No neck rigidity. Respiratory: Bilateral expiratory wheeze and no crepitations. Cardiovascular: S1-S2 heard. Abdomen: Soft nontender bowel sound present. Musculoskeletal: No edema. Skin: No rash. Neurologic: Alert awake oriented to time place and person.  Moves all extremities. Psychiatric: Appears normal per normal affect.   Labs on Admission: I have personally reviewed following labs and imaging studies  CBC: Recent Labs  Lab 09/01/19 1947 09/01/19 1958  WBC 12.3*  --   NEUTROABS 8.6*  --   HGB 15.4* 16.3*  HCT 50.1* 48.0*  MCV 97.7  --   PLT 314  --    Basic Metabolic Panel: Recent Labs  Lab 09/01/19 1947 09/01/19 1958  NA 135 135  K 4.3 4.3  CL 98  --   CO2 26  --   GLUCOSE 96  --   BUN 11  --   CREATININE 0.95  --  CALCIUM 8.9  --    GFR: Estimated Creatinine Clearance: 87.1 mL/min (by C-G formula based on SCr of 0.95 mg/dL). Liver Function Tests: No results for input(s): AST, ALT, ALKPHOS, BILITOT, PROT, ALBUMIN in the last 168 hours. No results for input(s): LIPASE, AMYLASE in the last 168 hours. No results for input(s): AMMONIA in the last 168 hours. Coagulation Profile: No results for input(s): INR, PROTIME in the last 168 hours. Cardiac Enzymes: No results for input(s): CKTOTAL, CKMB, CKMBINDEX, TROPONINI in the last 168 hours. BNP (last 3 results) No results for input(s): PROBNP in the last 8760 hours. HbA1C: No results for input(s): HGBA1C in the last 72  hours. CBG: No results for input(s): GLUCAP in the last 168 hours. Lipid Profile: No results for input(s): CHOL, HDL, LDLCALC, TRIG, CHOLHDL, LDLDIRECT in the last 72 hours. Thyroid Function Tests: No results for input(s): TSH, T4TOTAL, FREET4, T3FREE, THYROIDAB in the last 72 hours. Anemia Panel: No results for input(s): VITAMINB12, FOLATE, FERRITIN, TIBC, IRON, RETICCTPCT in the last 72 hours. Urine analysis: No results found for: COLORURINE, APPEARANCEUR, LABSPEC, PHURINE, GLUCOSEU, HGBUR, BILIRUBINUR, KETONESUR, PROTEINUR, UROBILINOGEN, NITRITE, LEUKOCYTESUR Sepsis Labs: @LABRCNTIP (procalcitonin:4,lacticidven:4) ) Recent Results (from the past 240 hour(s))  SARS Coronavirus 2 by RT PCR (hospital order, performed in [facility] lab) Nasopharyngeal Nasopharyngeal Swab     Status: None   Collection Time: 09/01/19  7:46 PM   Specimen: Nasopharyngeal Swab  Result Value Ref Range Status   SARS Coronavirus 2 NEGATIVE NEGATIVE Final    Comment: (NOTE) If result is NEGATIVE SARS-CoV-2 target nucleic acids are NOT DETECTED. The SARS-CoV-2 RNA is generally detectable in upper and lower  respiratory specimens during the acute phase of infection. The lowest  concentration of SARS-CoV-2 viral copies this assay can detect is 250  copies / mL. A negative result does not preclude SARS-CoV-2 infection  and should not be used as the sole basis for treatment or other  patient management decisions.  A negative result may occur with  improper specimen collection / handling, submission of specimen other  than nasopharyngeal swab, presence of viral mutation(s) within the  areas targeted by this assay, and inadequate number of viral copies  (<250 copies / mL). A negative result must be combined with clinical  observations, patient history, and epidemiological information. If result is POSITIVE SARS-CoV-2 target nucleic acids are DETECTED. The SARS-CoV-2 RNA is generally detectable in upper  and lower  respiratory specimens dur ing the acute phase of infection.  Positive  results are indicative of active infection with SARS-CoV-2.  Clinical  correlation with patient history and other diagnostic information is  necessary to determine patient infection status.  Positive results do  not rule out bacterial infection or co-infection with other viruses. If result is PRESUMPTIVE POSTIVE SARS-CoV-2 nucleic acids MAY BE PRESENT.   A presumptive positive result was obtained on the submitted specimen  and confirmed on repeat testing.  While 2019 novel coronavirus  (SARS-CoV-2) nucleic acids may be present in the submitted sample  additional confirmatory testing may be necessary for epidemiological  and / or clinical management purposes  to differentiate between  SARS-CoV-2 and other Sarbecovirus currently known to infect humans.  If clinically indicated additional testing with an alternate test  methodology [phone]([id number]) is advised. The SARS-CoV-2 RNA is generally  detectable in upper and lower respiratory sp ecimens during the acute  phase of infection. The expected result is Negative. Fact Sheet for Patients:  BoilerBrush.com.cyhttps://www.fda.gov/media/136312/download Fact Sheet for Healthcare Providers: https://pope.com/https://www.fda.gov/media/136313/download This  test is not yet approved or cleared by the [place] and has been authorized for detection and/or diagnosis of SARS-CoV-2 by FDA under an Emergency Use Authorization (EUA).  This EUA will remain in effect (meaning this test can be used) for the duration of the COVID-19 declaration under Section 564(b)(1) of the Act, 21 U.S.C. section 360bbb-3(b)(1), unless the authorization is terminated or revoked sooner. Performed at [facility], [place] [address] [place], [place] [address]      Radiological Exams on Admission: Dg Chest Portable 1 View  Result Date: 09/01/2019 CLINICAL DATA:  Pt [facility] for [facility]B, cough x3 days with diahreaa x4  days. States it is a sinus infection. 90% on Room air but refused nasal canula, will only tolerate 15L on NRB. Hx smoking, COPD, HTN, high cholesterol EXAM: PORTABLE CHEST 1 VIEW COMPARISON:  12/27/2017 FINDINGS: Cardiac silhouette is normal in size. No mediastinal or hilar masses. Lungs are hyperexpanded. There are prominent bronchovascular markings. Opacities noted at the anterior left lung base on the previous exam unchanged consistent with lingular scarring. No evidence of pneumonia or pulmonary edema. No pleural effusion or pneumothorax. Skeletal structures are grossly intact. IMPRESSION: 1. No acute cardiopulmonary disease. Electronically Signed   By: Lajean Manes M.D.   On: 09/01/2019 20:09    EKG: Independently reviewed.  Normal sinus rhythm.  Assessment/Plan Principal Problem:   Acute respiratory failure with hypoxia (HCC) Active Problems:   COPD exacerbation (HCC)   Essential hypertension   HLD (hyperlipidemia)    1. Acute respiratory failure with hypoxia and hypercarbia secondary to COPD exacerbation for which patient is placed on steroids nebulizer antibiotics.  Will get a CT angiogram of the chest since patient is hypoxic.  ABG shows pH of 7.27 PCO2 of 71.3 PO2 of 9100% nonrebreather.  Will check BNP.  Will get pulmonary critical care opinion. 2. Hypertension on amlodipine. 3. Hyperlipidemia on statins. 4. Tobacco abuse advised about quitting.  Given the acute hypoxic respiratory failure patient will need inpatient status and more than 2 midnight stay.   DVT prophylaxis: Lovenox. Code Status: Full code. Family Communication: Discussed with patient. Disposition Plan: Home. Consults called: Discussed with pulmonary critical care. Admission status: Inpatient.   Rise Patience MD [facility] Pager [phone].  If 7PM-7AM, please contact night-coverage [email or web address] Password [facility]  09/02/2019, 12:19 AM

## 2019-09-02 NOTE — Progress Notes (Signed)
Placed pt on nasal mask per protocol, pt has never worn BiPAP full face mask contraindicated. Pt stated she could not handle pressures at lowest settings. RT placed pt on auto settings with a min of 4 and max of 20. Pt stated that these settings were easier, but still continued to state "I cant take this man" RT to continue to monitor as needed

## 2019-09-03 ENCOUNTER — Inpatient Hospital Stay (HOSPITAL_COMMUNITY): Payer: Medicaid Other

## 2019-09-03 DIAGNOSIS — E785 Hyperlipidemia, unspecified: Secondary | ICD-10-CM

## 2019-09-03 DIAGNOSIS — R0602 Shortness of breath: Secondary | ICD-10-CM

## 2019-09-03 LAB — BLOOD GAS, ARTERIAL
Acid-Base Excess: 6.2 mmol/L — ABNORMAL HIGH (ref 0.0–2.0)
Bicarbonate: 31.9 mmol/L — ABNORMAL HIGH (ref 20.0–28.0)
FIO2: 40
O2 Saturation: 90.4 %
Patient temperature: 36.4
pCO2 arterial: 61.1 mmHg — ABNORMAL HIGH (ref 32.0–48.0)
pH, Arterial: 7.334 — ABNORMAL LOW (ref 7.350–7.450)
pO2, Arterial: 60.2 mmHg — ABNORMAL LOW (ref 83.0–108.0)

## 2019-09-03 LAB — CBC WITH DIFFERENTIAL/PLATELET
Abs Immature Granulocytes: 0.18 10*3/uL — ABNORMAL HIGH (ref 0.00–0.07)
Basophils Absolute: 0 10*3/uL (ref 0.0–0.1)
Basophils Relative: 0 %
Eosinophils Absolute: 0 10*3/uL (ref 0.0–0.5)
Eosinophils Relative: 0 %
HCT: 51.3 % — ABNORMAL HIGH (ref 36.0–46.0)
Hemoglobin: 15.9 g/dL — ABNORMAL HIGH (ref 12.0–15.0)
Immature Granulocytes: 1 %
Lymphocytes Relative: 9 %
Lymphs Abs: 1.3 10*3/uL (ref 0.7–4.0)
MCH: 30.2 pg (ref 26.0–34.0)
MCHC: 31 g/dL (ref 30.0–36.0)
MCV: 97.5 fL (ref 80.0–100.0)
Monocytes Absolute: 0.6 10*3/uL (ref 0.1–1.0)
Monocytes Relative: 4 %
Neutro Abs: 12.8 10*3/uL — ABNORMAL HIGH (ref 1.7–7.7)
Neutrophils Relative %: 86 %
Platelets: 353 10*3/uL (ref 150–400)
RBC: 5.26 MIL/uL — ABNORMAL HIGH (ref 3.87–5.11)
RDW: 15 % (ref 11.5–15.5)
WBC: 15 10*3/uL — ABNORMAL HIGH (ref 4.0–10.5)
nRBC: 0 % (ref 0.0–0.2)

## 2019-09-03 LAB — BASIC METABOLIC PANEL
Anion gap: 13 (ref 5–15)
Anion gap: 9 (ref 5–15)
BUN: 15 mg/dL (ref 6–20)
BUN: 16 mg/dL (ref 6–20)
CO2: 26 mmol/L (ref 22–32)
CO2: 28 mmol/L (ref 22–32)
Calcium: 9.4 mg/dL (ref 8.9–10.3)
Calcium: 9.2 mg/dL (ref 8.9–10.3)
Chloride: 98 mmol/L (ref 98–111)
Chloride: 100 mmol/L (ref 98–111)
Creatinine, Ser: 1.1 mg/dL — ABNORMAL HIGH (ref 0.44–1.00)
Creatinine, Ser: 1.08 mg/dL — ABNORMAL HIGH (ref 0.44–1.00)
GFR calc Af Amer: >60 mL/min (ref >60)
GFR calc Af Amer: >60 mL/min (ref >60)
GFR calc non Af Amer: 55 mL/min — ABNORMAL LOW (ref >60)
GFR calc non Af Amer: 57 mL/min — ABNORMAL LOW (ref >60)
Glucose, Bld: 122 mg/dL — ABNORMAL HIGH (ref 70–99)
Glucose, Bld: 191 mg/dL — ABNORMAL HIGH (ref 70–99)
Potassium: 5.6 mmol/L — ABNORMAL HIGH (ref 3.5–5.1)
Potassium: 4.3 mmol/L (ref 3.5–5.1)
Sodium: 137 mmol/L (ref 135–145)
Sodium: 137 mmol/L (ref 135–145)

## 2019-09-03 LAB — ECHOCARDIOGRAM COMPLETE
Height: 66 in
Weight: 4091.74 oz

## 2019-09-03 MED ORDER — FUROSEMIDE 10 MG/ML IJ SOLN
40.0000 mg | Freq: Once | INTRAMUSCULAR | Status: AC
  Administered 2019-09-03: 40 mg via INTRAVENOUS
  Filled 2019-09-03: qty 4

## 2019-09-03 NOTE — Progress Notes (Deleted)
NAME:  Kim [name], MRN:  [id number], DOB:  [birth year], LOS: 1 ADMISSION DATE:  09/01/2019, CONSULTATION DATE:  09/02/19 REFERRING MD:  Toniann Fail - TRH , CHIEF COMPLAINT:  Hypoxia   Brief History    History of present illness   58 yo F PMH Tobacco abuse, HTN, HLD, presents to ED 11/1 with SOB. SOB began 4 days prior to presentation and have associated s/sx of wheezing, productive cough. Denies chest pain, fever, chills, sick contacts. Treatment was initiated for suspected acute COPD exacerbation, including  Azithromycin nebulizers and steroids. PCCM consulted for hypoxia   COVID-19 negative  Past Medical History  Tobacco abuse HTN HLD   Significant Hospital Events   11/1> presents to ED with worsening SOB  11/2> admitted to hospitalist service. PCCM consulted.   Consults:  PCCM  Procedures:    Significant Diagnostic Tests:  11/1 CXR> hyper-expanded lungs. L basilar opacity felt to be consistent with lingular scarring   11/2 CTA chest> motion artifact present, no clear evidence of PE and possible filling defects are felt to be artifact related. Low lung volumes, bibasilar atelectasis.  Hepatic steatosis  Micro Data:  11/1 SARS CoV2 > neg 11/2 RVP >>> positive rhinovirus 11/2 sputum cx >>>   Antimicrobials:  Azithromycin 11/2>>   Interim history/subjective:  Unable to wear BiPAP during the night. Does not really appear to be in acute distress  Objective   Blood pressure 125/62, pulse 78, temperature 97.6 F (36.4 C), temperature source Oral, resp. rate 15, height 5\' 6"  (1.676 m), weight 116 kg, SpO2 97 %.    FiO2 (%):  [50 %] 50 %   Intake/Output Summary (Last 24 hours) at 09/03/2019 1058 Last data filed at 09/02/2019 1215 Gross per 24 hour  Intake 248.99 ml  Output 300 ml  Net -51.01 ml   Filed Weights   09/01/19 1915 09/02/19 13/02/20  Weight: 124.7 kg 116 kg    Examination: General: Morbidly obese female who is noted to be severely kyphotic HEENT: Poor  dentition no JVD or lymphadenopathy is appreciated Neuro: Grossly intact CV: s1s2 regular rate and rhythm PULM: Diminished throughout GI: soft, bsx4 active  Extremities: warm/dry, negative edema  Skin: no rashes or lesions   Resolved Hospital Problem list    Assessment & Plan:   Acute on chronic respiratory failure with hypoxia and hypercarbia positive rhinovirus possible AECOPD (COPD likely  underlying condition, no diagnosis of this in hx)  possible undiagnosed OSA vs OHS (patient and husband endorse hx consistent with apnea vs hypoventilation)  COVID-19 negative  BNP 64.7 CXR suggestive of lingular scarring CTA chest without evidence of PE, however does reveal bibasilar atelectasis Body habitus/kyphosis limits mechanical phase to her hypercarbia hypoxia P Weaning FiO2 as tolerated Encourage BiPAP she will attempt tonight ABGs noted to be consistent with obesity hypoventilation syndrome with a PCO2 of 60 with a pH of 7.33 She will need BiPAP at home Wean steroids currently on Solu-Medrol 40 mg IV every 12 Consider discontinuing antimicrobial therapy Continue bronchodilators Mobilize ambulate She will need outpatient follow-up for sleep study. Consider 2D echo to evaluate function of heart Gentle diuresis    Tobacco Abuse 40+ years of tobacco use, 1-2 ppd  P Encouraged to stop smoking Nicotine patch  Hyperglycemia no known Hx DM acute hyperglycemia possibly in setting of steroids CBG (last 3)  No results for input(s): GLUCAP in the last 72 hours.   P Continue to follow  HTN HLD Per primary   Best practice:  Diet: Heart healthy  Pain/Anxiety/Delirium protocol (if indicated): na VAP protocol (if indicated): na  DVT prophylaxis: lovenox  GI prophylaxis: na Glucose control: monitor  Mobility: up with assistance  Code Status: Full  Family Communication: September 03, 2019 patient updated at bedside.  And refused to wear BiPAP during the night.  The  importance of BiPAP pointed out to her.  She reports she is 1 attempt to do BiPAP tonight Disposition: Progressive   Labs   CBC: Recent Labs  Lab 09/01/19 1947 09/01/19 1958 09/02/19 0046 09/02/19 0230 09/03/19 0405  WBC 12.3*  --   --  12.2* 15.0*  NEUTROABS 8.6*  --   --   --  12.8*  HGB 15.4* 16.3* 15.0 15.1* 15.9*  HCT 50.1* 48.0* 44.0 49.0* 51.3*  MCV 97.7  --   --  97.8 97.5  PLT 314  --   --  308 268    Basic Metabolic Panel: Recent Labs  Lab 09/01/19 1947 09/01/19 1958 09/02/19 0046 09/02/19 0230 09/03/19 0405 09/03/19 0855  NA 135 135 137 137 137 137  K 4.3 4.3 3.8 3.9 5.6* 4.3  CL 98  --   --  98 98 100  CO2 26  --   --  29 26 28   GLUCOSE 96  --   --  127* 122* 191*  BUN 11  --   --  10 15 16   CREATININE 0.95  --   --  0.99  1.00 1.10* 1.08*  CALCIUM 8.9  --   --  9.1 9.4 9.2   GFR: Estimated Creatinine Clearance: 73.5 mL/min (A) (by C-G formula based on SCr of 1.08 mg/dL (H)). Recent Labs  Lab 09/01/19 1947 09/02/19 0230 09/03/19 0405  WBC 12.3* 12.2* 15.0*    Liver Function Tests: No results for input(s): AST, ALT, ALKPHOS, BILITOT, PROT, ALBUMIN in the last 168 hours. No results for input(s): LIPASE, AMYLASE in the last 168 hours. No results for input(s): AMMONIA in the last 168 hours.  ABG    Component Value Date/Time   PHART 7.334 (L) 09/03/2019 0832   PCO2ART 61.1 (H) 09/03/2019 0832   PO2ART 60.2 (L) 09/03/2019 0832   HCO3 31.9 (H) 09/03/2019 0832   TCO2 35 (H) 09/02/2019 0046   O2SAT 90.4 09/03/2019 0832     Coagulation Profile: No results for input(s): INR, PROTIME in the last 168 hours.  Cardiac Enzymes: No results for input(s): CKTOTAL, CKMB, CKMBINDEX, TROPONINI in the last 168 hours.  HbA1C: No results found for: HGBA1C  CBG:  Kim [name] ACNP Kim [name] PCCM Pager [phone] till 1 pm If no answer page [phone] 09/03/2019, 10:58 AM  Attending Note:  58 year old female with morbid obesity and kyphoscoliosis  presenting with hypoxemic respiratory failure.  Did not wear BiPAP overnight.  On exam, diminished BS diffusely.  I reviewed CXR myself, pulmonary edema noted.  Discussed with PCCM-NP.  Acute on chronic hypoxemic respiratory failure:  - Needs BiPAP at night, likely trilogy at home  - Consult case management for arranging Trilogy at home and ?the requirement for such  - Keep dry as able  - Weight loss  Atelectasis:  - Ambulate  - OOB to chair  - IS  - Flutter valve  Acute pulmonary edema:  - Keep dry as able  OSA:  - BiPAP at night  PCCM will sign off, please call back if needed  Patient seen and examined, agree with above note.  I dictated the care and  orders written for this patient under my direction.  Alyson ReedyWesam G. Brynnly Bonet, M.D. [facility].

## 2019-09-03 NOTE — Progress Notes (Addendum)
PROGRESS NOTE  Kim [name] PJS:[id number] DOB: 16-Jul-1961 DOA: 09/01/2019 PCP: Marcie Mowers, FNP  HPI/Recap of past 24 hours: [name] is a 58 y.o. female with history of tobacco abuse, hypertension, hyperlipidemia presents to the ER because of worsening shortness of breath.  Patient states she has been short of breath the last 4 to 5 days.  Has been having wheezing and productive cough, denies any fever/chills, has been having chest tightness, no L sided chest pain. Given these worsening symptoms patient came to the ER. In the ED, patient was hypoxic requiring almost 100% nonrebreather.  Chest x-ray does not show anything acute.  EKG shows normal sinus rhythm. Pt was placed on nebulizer treatment and steroids. Admitted for acute respiratory failure secondary to COPD exacerbation. COVID-19 was negative.   Today, met patient sitting up in a chair.  Requesting to be put back in bed.  Reports breathing has improved some, denies worsening cough, worsening shortness of breath, abdominal pain, nausea/vomiting, fever/chills, chest pain.  Unable to tolerate BiPAP last night, encourage patient to try again tonight.  Assessment/Plan: Principal Problem:   Acute respiratory failure with hypoxia (HCC) Active Problems:   COPD exacerbation (HCC)   Essential hypertension   HLD (hyperlipidemia)  Acute hyperbaric, hypoxic respiratory failure likely 2/2 COPD exacerbation 2/2 Rhinovirus  Currently weaned off of a nonrebreather, currently on 4L of oxygen nasal cannula (doesn't use O2 at home) Afebrile, with leukocytosis (on steroids) ABG showed pH of 7.27, PCO2 71.3, PO2 90, bicarb 32.9 BNP 64.7 Troponin 5 Respiratory viral panel positive for rhinovirus Echo done 09/30/2019 showed EF of 70 to 29%, grade 1 diastolic dysfunction Chest x-ray unremarkable CTA chest showed no PE, although noted respiratory motion artifact, low lung volumes with bibasilar atelectasis PCCM  consulted, appreciate recs.  Suspect underlying OSA/OHS.  Sleep study as an outpatient. ??  Interstitial lung disease, consider HRCT as an outpatient Continue steroids, azithromycin, inhalers/nebulizers Supplemental oxygen as needed BiPAP at night, currently unable to tolerate, keep encouraging patient Monitor closely on a progressive unit PT  Hypertension Stable Continue Norvasc  Hyperlipidemia Continue statins  Tobacco abuse Advised to quit Nicotine patch ordered  Morbid obesity Lifestyle modification advised        Malnutrition Type:      Malnutrition Characteristics:      Nutrition Interventions:       Estimated body mass index is 41.28 kg/m as calculated from the following:   Height as of this encounter: '5\' 6"'  (1.676 m).   Weight as of this encounter: 116 kg.     Code Status: Full  Family Communication: None at bedside  Disposition Plan: To be determined   Consultants:  PCCM  Procedures:  None  Antimicrobials:  Azithromycin  DVT prophylaxis: Lovenox   Objective: Vitals:   09/03/19 0313 09/03/19 0315 09/03/19 0726 09/03/19 1123  BP:  (!) 112/58 125/62 (!) 128/57  Pulse:  75 78 93  Resp:  14 15   Temp: 97.8 F (36.6 C) 97.7 F (36.5 C) 97.6 F (36.4 C) 97.9 F (36.6 C)  TempSrc: Oral  Oral Oral  SpO2:  98% 97% 94%  Weight:      Height:       No intake or output data in the 24 hours ending 09/03/19 1554 Filed Weights   09/01/19 1915 09/02/19 2446  Weight: 124.7 kg 116 kg    Exam:  General: NAD, acutely ill-appearing  Cardiovascular: S1, S2 present  Respiratory:  Diminished  breath sounds bilaterally, no wheezing noted  Abdomen: Soft, nontender, nondistended, bowel sounds present  Musculoskeletal: No bilateral pedal edema noted  Skin: Normal  Psychiatry: Normal mood   Data Reviewed: CBC: Recent Labs  Lab 09/01/19 1947 09/01/19 1958 09/02/19 0046 09/02/19 0230 09/03/19 0405  WBC 12.3*  --   --  12.2*  15.0*  NEUTROABS 8.6*  --   --   --  12.8*  HGB 15.4* 16.3* 15.0 15.1* 15.9*  HCT 50.1* 48.0* 44.0 49.0* 51.3*  MCV 97.7  --   --  97.8 97.5  PLT 314  --   --  308 419   Basic Metabolic Panel: Recent Labs  Lab 09/01/19 1947 09/01/19 1958 09/02/19 0046 09/02/19 0230 09/03/19 0405 09/03/19 0855  NA 135 135 137 137 137 137  K 4.3 4.3 3.8 3.9 5.6* 4.3  CL 98  --   --  98 98 100  CO2 26  --   --  '29 26 28  ' GLUCOSE 96  --   --  127* 122* 191*  BUN 11  --   --  '10 15 16  ' CREATININE 0.95  --   --  0.99  1.00 1.10* 1.08*  CALCIUM 8.9  --   --  9.1 9.4 9.2   GFR: Estimated Creatinine Clearance: 73.5 mL/min (A) (by C-G formula based on SCr of 1.08 mg/dL (H)). Liver Function Tests: No results for input(s): AST, ALT, ALKPHOS, BILITOT, PROT, ALBUMIN in the last 168 hours. No results for input(s): LIPASE, AMYLASE in the last 168 hours. No results for input(s): AMMONIA in the last 168 hours. Coagulation Profile: No results for input(s): INR, PROTIME in the last 168 hours. Cardiac Enzymes: No results for input(s): CKTOTAL, CKMB, CKMBINDEX, TROPONINI in the last 168 hours. BNP (last 3 results) No results for input(s): PROBNP in the last 8760 hours. HbA1C: No results for input(s): HGBA1C in the last 72 hours. CBG: No results for input(s): GLUCAP in the last 168 hours. Lipid Profile: No results for input(s): CHOL, HDL, LDLCALC, TRIG, CHOLHDL, LDLDIRECT in the last 72 hours. Thyroid Function Tests: Recent Labs    09/02/19 0230  TSH 3.029   Anemia Panel: No results for input(s): VITAMINB12, FOLATE, FERRITIN, TIBC, IRON, RETICCTPCT in the last 72 hours. Urine analysis: No results found for: COLORURINE, APPEARANCEUR, LABSPEC, PHURINE, GLUCOSEU, HGBUR, BILIRUBINUR, KETONESUR, PROTEINUR, UROBILINOGEN, NITRITE, LEUKOCYTESUR Sepsis Labs: '@LABRCNTIP' (procalcitonin:4,lacticidven:4)  ) Recent Results (from the past 240 hour(s))  SARS Coronavirus 2 by RT PCR (hospital order, performed in  [facility] hospital lab) Nasopharyngeal Nasopharyngeal Swab     Status: None   Collection Time: 09/01/19  7:46 PM   Specimen: Nasopharyngeal Swab  Result Value Ref Range Status   SARS Coronavirus 2 NEGATIVE NEGATIVE Final    Comment: (NOTE) If result is NEGATIVE SARS-CoV-2 target nucleic acids are NOT DETECTED. The SARS-CoV-2 RNA is generally detectable in upper and lower  respiratory specimens during the acute phase of infection. The lowest  concentration of SARS-CoV-2 viral copies this assay can detect is 250  copies / mL. A negative result does not preclude SARS-CoV-2 infection  and should not be used as the sole basis for treatment or other  patient management decisions.  A negative result may occur with  improper specimen collection / handling, submission of specimen other  than nasopharyngeal swab, presence of viral mutation(s) within the  areas targeted by this assay, and inadequate number of viral copies  (<250 copies / mL). A negative result must be  combined with clinical  observations, patient history, and epidemiological information. If result is POSITIVE SARS-CoV-2 target nucleic acids are DETECTED. The SARS-CoV-2 RNA is generally detectable in upper and lower  respiratory specimens dur ing the acute phase of infection.  Positive  results are indicative of active infection with SARS-CoV-2.  Clinical  correlation with patient history and other diagnostic information is  necessary to determine patient infection status.  Positive results do  not rule out bacterial infection or co-infection with other viruses. If result is PRESUMPTIVE POSTIVE SARS-CoV-2 nucleic acids MAY BE PRESENT.   A presumptive positive result was obtained on the submitted specimen  and confirmed on repeat testing.  While 2019 novel coronavirus  (SARS-CoV-2) nucleic acids may be present in the submitted sample  additional confirmatory testing may be necessary for epidemiological  and / or clinical  management purposes  to differentiate between  SARS-CoV-2 and other Sarbecovirus currently known to infect humans.  If clinically indicated additional testing with an alternate test  methodology [id number]) is advised. The SARS-CoV-2 RNA is generally  detectable in upper and lower respiratory sp ecimens during the acute  phase of infection. The expected result is Negative. Fact Sheet for Patients:  StrictlyIdeas.no Fact Sheet for Healthcare Providers: BankingDealers.co.za This test is not yet approved or cleared by the [place] FDA and has been authorized for detection and/or diagnosis of SARS-CoV-2 by FDA under an Emergency Use Authorization (EUA).  This EUA will remain in effect (meaning this test can be used) for the duration of the COVID-19 declaration under Section 564(b)(1) of the Act, 21 U.S.C. section 360bbb-3(b)(1), unless the authorization is terminated or revoked sooner. Performed at [facility], [place] [address], [place], [place] [address]   MRSA PCR Screening     Status: None   Collection Time: 09/02/19  7:00 AM   Specimen: Nasopharyngeal  Result Value Ref Range Status   MRSA by PCR NEGATIVE NEGATIVE Final    Comment:        The GeneXpert MRSA Assay (FDA approved for NASAL specimens only), is one component of a comprehensive MRSA colonization surveillance program. It is not intended to diagnose MRSA infection nor to guide or monitor treatment for MRSA infections. Performed at [facility], [place] [address], [place], [place] [address]   Respiratory Panel by PCR     Status: Abnormal   Collection Time: 09/02/19 10:35 AM   Specimen: Nasopharyngeal Swab; Respiratory  Result Value Ref Range Status   Adenovirus NOT DETECTED NOT DETECTED Final   Coronavirus 229E NOT DETECTED NOT DETECTED Final    Comment: (NOTE) The Coronavirus on the Respiratory Panel, DOES NOT test for the novel  Coronavirus (2019 nCoV)     Coronavirus HKU1 NOT DETECTED NOT DETECTED Final   Coronavirus NL63 NOT DETECTED NOT DETECTED Final   Coronavirus OC43 NOT DETECTED NOT DETECTED Final   Metapneumovirus NOT DETECTED NOT DETECTED Final   Rhinovirus / Enterovirus DETECTED (A) NOT DETECTED Final   Influenza A NOT DETECTED NOT DETECTED Final   Influenza B NOT DETECTED NOT DETECTED Final   Parainfluenza Virus 1 NOT DETECTED NOT DETECTED Final   Parainfluenza Virus 2 NOT DETECTED NOT DETECTED Final   Parainfluenza Virus 3 NOT DETECTED NOT DETECTED Final   Parainfluenza Virus 4 NOT DETECTED NOT DETECTED Final   Respiratory Syncytial Virus NOT DETECTED NOT DETECTED Final   Bordetella pertussis NOT DETECTED NOT DETECTED Final   Chlamydophila pneumoniae NOT DETECTED NOT DETECTED Final   Mycoplasma pneumoniae  NOT DETECTED NOT DETECTED Final    Comment: Performed at [facility], [place] [address] [place], [place] [address]      Studies: No results found.  Scheduled Meds: . amLODipine  5 mg Oral Daily  . aspirin EC  81 mg Oral Daily  . budesonide (PULMICORT) nebulizer solution  0.25 mg Nebulization BID  . enoxaparin (LOVENOX) injection  40 mg Subcutaneous Q24H  . ipratropium-albuterol  3 mL Nebulization TID  . methylPREDNISolone (SOLU-MEDROL) injection  40 mg Intravenous Q12H  . nicotine  14 mg Transdermal Daily  . pravastatin  20 mg Oral Daily    Continuous Infusions: . azithromycin 500 mg (09/03/19 0854)     LOS: 1 day     Alma Friendly, MD [facility]  If 7PM-7AM, please contact night-coverage [email or web address] 09/03/2019, 3:54 PM

## 2019-09-03 NOTE — Progress Notes (Signed)
Pt attempted to wear Bipap for about 15 minutes; kept pulling it off stating that she couldn't take it and wanted to go back on the nasal cannula. Pt instructed to use call bell; has been calling out repeatedly and is forgetful.

## 2019-09-03 NOTE — Progress Notes (Signed)
  Echocardiogram 2D Echocardiogram has been performed.  Kim [name] 09/03/2019, 1:59 PM

## 2019-09-03 NOTE — Evaluation (Signed)
Physical Therapy Evaluation Patient Details Name: Kim [name] MRN: [id number] DOB: [birth year] Today's Date: 09/03/2019   History of Present Illness  58 y.o. female with history of tobacco abuse, hypertension, hyperlipidemia presents to the ER because of worsening shortness of breath. In the ER patient was hypoxic requiring almost 100% nonrebreather. Admitted for acute respiratory failure with hypoxia and hypercarbia secondary to COPD 09/02/19  Clinical Impression  PTA pt living in single story home with 4 steps to enter. Pt reports independence in limited community ambulation, using a buggy for support with ambulation in grocery store. Pt is currently limited in safe mobility by oxygen desaturation (see General Comments) in presence of generalized weakness and decreased balance. Pt requires supervision for bed mobility, and min guard for transfers and ambulation of 5 feet with RW. PT recommends HHPT level rehab at discharge to improve strength and balance to return to PLOF. PT will continue to follow acutely.     Follow Up Recommendations Home health PT;Supervision - Intermittent    Equipment Recommendations  Rolling walker with 5" wheels       Precautions / Restrictions Precautions Precautions: Fall Restrictions Weight Bearing Restrictions: No      Mobility  Bed Mobility Overal bed mobility: Needs Assistance Bed Mobility: Supine to Sit     Supine to sit: Supervision;HOB elevated     General bed mobility comments: supervision for safety, increased time and effort with use of bedrail   Transfers Overall transfer level: Needs assistance Equipment used: Rolling walker (2 wheeled) Transfers: Sit to/from Stand Sit to Stand: Min guard         General transfer comment: min guard for safety, good power up and self steadying   Ambulation/Gait Ambulation/Gait assistance: Min guard Gait Distance (Feet): 5 Feet Assistive device: Rolling walker (2 wheeled) Gait  Pattern/deviations: Step-through pattern;Decreased step length - right;Decreased step length - left;Shuffle;Trunk flexed Gait velocity: slowed Gait velocity interpretation: 1.31 - 2.62 ft/sec, indicative of limited community ambulator General Gait Details: min guard for ambulation from bed to recliner, slow, shuffling steps         Balance Overall balance assessment: Needs assistance Sitting-balance support: Feet supported;No upper extremity supported Sitting balance-Leahy Scale: Good     Standing balance support: Single extremity supported;Bilateral upper extremity supported;During functional activity Standing balance-Leahy Scale: Fair Standing balance comment: pt request to stand for 3 minutes before sitting in recliner, pt required at least single UE support                              Pertinent Vitals/Pain Pain Assessment: No/denies pain    Home Living Family/patient expects to be discharged to:: Private residence Living Arrangements: Children Available Help at Discharge: Family;Available PRN/intermittently Type of Home: House Home Access: Stairs to enter Entrance Stairs-Rails: Can reach both Entrance Stairs-Number of Steps: 4 Home Layout: One level Home Equipment: Grab bars - tub/shower      Prior Function Level of Independence: Independent         Comments: limited community ambulation, holds onto buggy in grocery store      Hand Dominance        Extremity/Trunk Assessment   Upper Extremity Assessment Upper Extremity Assessment: Generalized weakness    Lower Extremity Assessment Lower Extremity Assessment: Generalized weakness    Cervical / Trunk Assessment Cervical / Trunk Assessment: Kyphotic  Communication   Communication: No difficulties  Cognition Arousal/Alertness: Awake/alert Behavior During Therapy: WFL for tasks assessed/performed Overall  Cognitive Status: Within Functional Limits for tasks assessed                                         General Comments General comments (skin integrity, edema, etc.): On 5L O2 via [place] SaO2 95%O2, with mobility SaO2 dropped to 85%O2, supplemental O2 increased to 6L O2 and SaO2 increased to 92%O2, with sitting in recliner pt able to return to 5L O2 while maintaining SaO2 92%O2, VSS        Assessment/Plan    PT Assessment Patient needs continued PT services  PT Problem List Decreased strength;Decreased activity tolerance;Decreased mobility;Cardiopulmonary status limiting activity       PT Treatment Interventions DME instruction;Gait training;Functional mobility training;Therapeutic activities;Therapeutic exercise;Balance training;Cognitive remediation;Patient/family education    PT Goals (Current goals can be found in the Care Plan section)  Acute Rehab PT Goals Patient Stated Goal: breathe easier PT Goal Formulation: With patient Time For Goal Achievement: 09/17/19 Potential to Achieve Goals: Good    Frequency Min 3X/week    AM-PAC PT "6 Clicks" Mobility  Outcome Measure Help needed turning from your back to your side while in a flat bed without using bedrails?: None Help needed moving from lying on your back to sitting on the side of a flat bed without using bedrails?: None Help needed moving to and from a bed to a chair (including a wheelchair)?: None Help needed standing up from a chair using your arms (e.g., wheelchair or bedside chair)?: None Help needed to walk in hospital room?: A Little Help needed climbing 3-5 steps with a railing? : A Lot 6 Click Score: 21    End of Session Equipment Utilized During Treatment: Gait belt;Oxygen Activity Tolerance: Treatment limited secondary to medical complications (Comment)(limited by O2 desaturation) Patient left: in chair;with call bell/phone within reach Nurse Communication: Mobility status;Other (comment) PT Visit Diagnosis: Unsteadiness on feet (R26.81);Other abnormalities of gait and mobility  (R26.89);Muscle weakness (generalized) (M62.81);Difficulty in walking, not elsewhere classified (R26.2)    Time: 3244-0102 PT Time Calculation (min) (ACUTE ONLY): 23 min   Charges:   PT Evaluation $PT Eval Moderate Complexity: 1 Mod PT Treatments $Therapeutic Activity: 8-22 mins        Kim [name] B. Migdalia Dk PT, DPT Acute Rehabilitation Services Pager [phone] Office [phone]   [place] 09/03/2019, 10:41 AM

## 2019-09-04 DIAGNOSIS — J441 Chronic obstructive pulmonary disease with (acute) exacerbation: Secondary | ICD-10-CM

## 2019-09-04 DIAGNOSIS — J810 Acute pulmonary edema: Secondary | ICD-10-CM

## 2019-09-04 DIAGNOSIS — G4733 Obstructive sleep apnea (adult) (pediatric): Secondary | ICD-10-CM

## 2019-09-04 LAB — CBC WITH DIFFERENTIAL/PLATELET
Abs Immature Granulocytes: 0.17 10*3/uL — ABNORMAL HIGH (ref 0.00–0.07)
Basophils Absolute: 0 10*3/uL (ref 0.0–0.1)
Basophils Relative: 0 %
Eosinophils Absolute: 0 10*3/uL (ref 0.0–0.5)
Eosinophils Relative: 0 %
HCT: 47.7 % — ABNORMAL HIGH (ref 36.0–46.0)
Hemoglobin: 14.4 g/dL (ref 12.0–15.0)
Immature Granulocytes: 1 %
Lymphocytes Relative: 9 %
Lymphs Abs: 1.5 10*3/uL (ref 0.7–4.0)
MCH: 29.5 pg (ref 26.0–34.0)
MCHC: 30.2 g/dL (ref 30.0–36.0)
MCV: 97.7 fL (ref 80.0–100.0)
Monocytes Absolute: 0.7 10*3/uL (ref 0.1–1.0)
Monocytes Relative: 5 %
Neutro Abs: 14 10*3/uL — ABNORMAL HIGH (ref 1.7–7.7)
Neutrophils Relative %: 85 %
Platelets: 340 10*3/uL (ref 150–400)
RBC: 4.88 MIL/uL (ref 3.87–5.11)
RDW: 15.1 % (ref 11.5–15.5)
WBC: 16.4 10*3/uL — ABNORMAL HIGH (ref 4.0–10.5)
nRBC: 0 % (ref 0.0–0.2)

## 2019-09-04 LAB — BASIC METABOLIC PANEL
Anion gap: 10 (ref 5–15)
BUN: 18 mg/dL (ref 6–20)
CO2: 31 mmol/L (ref 22–32)
Calcium: 9.1 mg/dL (ref 8.9–10.3)
Chloride: 97 mmol/L — ABNORMAL LOW (ref 98–111)
Creatinine, Ser: 0.98 mg/dL (ref 0.44–1.00)
GFR calc Af Amer: >60 mL/min (ref >60)
GFR calc non Af Amer: >60 mL/min (ref >60)
Glucose, Bld: 128 mg/dL — ABNORMAL HIGH (ref 70–99)
Potassium: 3.6 mmol/L (ref 3.5–5.1)
Sodium: 138 mmol/L (ref 135–145)

## 2019-09-04 MED ORDER — GUAIFENESIN-DM 100-10 MG/5ML PO SYRP
10.0000 mL | ORAL_SOLUTION | Freq: Four times a day (QID) | ORAL | Status: DC
  Administered 2019-09-04 – 2019-09-06 (×6): 10 mL via ORAL
  Administered 2019-09-07 – 2019-09-08 (×4): 5 mL via ORAL
  Administered 2019-09-10: 10 mL via ORAL
  Administered 2019-09-10: 5 mL via ORAL
  Administered 2019-09-11: 10 mL via ORAL
  Administered 2019-09-11 – 2019-09-12 (×2): 5 mL via ORAL
  Administered 2019-09-12 – 2019-09-13 (×3): 10 mL via ORAL
  Filled 2019-09-04 (×21): qty 10

## 2019-09-04 MED ORDER — CALCIUM CARBONATE ANTACID 500 MG PO CHEW
1.0000 | CHEWABLE_TABLET | Freq: Two times a day (BID) | ORAL | Status: DC | PRN
  Administered 2019-09-04 – 2019-09-08 (×5): 200 mg via ORAL
  Filled 2019-09-04 (×5): qty 1

## 2019-09-04 NOTE — TOC Initial Note (Signed)
Transition of Care [facility]) - Initial/Assessment Note    Patient Details  Name: Kim [name] MRN: [id number] Date of Birth: 14-Jan-1961  Transition of Care [facility] CM/SW Contact:    Maryclare Labrador, RN Phone Number: 09/04/2019, 4:22 PM  Clinical Narrative:   PTA independent from home - pt states her daughter lives with her however she is "in and out".   Pt informed CM that she already has a rolling walker.   Pt is interested in [facility] as recommended.  CM provided medicare.gov HH list choice and pt informed CM she would think about it.                    Expected Discharge Plan: [place] Barriers to Discharge: Continued Medical Work up   Patient Goals and CMS Choice        Expected Discharge Plan and Services Expected Discharge Plan: [place]       Living arrangements for the past 2 months: Single Family Home                                      Prior Living Arrangements/Services Living arrangements for the past 2 months: Single Family Home Lives with:: Adult Children Patient language and need for interpreter reviewed:: Yes Do you feel safe going back to the place where you live?: Yes          Current home services: DME(rolling walker) Criminal Activity/Legal Involvement Pertinent to Current Situation/Hospitalization: No - Comment as needed  Activities of Daily Living Home Assistive Devices/Equipment: None ADL Screening (condition at time of admission) Patient's cognitive ability adequate to safely complete daily activities?: Yes Is the patient deaf or have difficulty hearing?: No Does the patient have difficulty seeing, even when wearing glasses/contacts?: No Does the patient have difficulty concentrating, remembering, or making decisions?: No Patient able to express need for assistance with ADLs?: Yes Does the patient have difficulty dressing or bathing?: No Independently performs ADLs?: Yes (appropriate for developmental  age) Does the patient have difficulty walking or climbing stairs?: No Weakness of Legs: None Weakness of Arms/Hands: None  Permission Sought/Granted                  Emotional Assessment   Attitude/Demeanor/Rapport: Engaged Affect (typically observed): Accepting Orientation: : Oriented to Self, Oriented to Place, Oriented to  Time, Oriented to Situation      Admission diagnosis:  COPD exacerbation ([place]) [J44.1] Acute respiratory failure with hypoxia ([place]) [J96.01] Patient Active Problem List   Diagnosis Date Noted  . OSA (obstructive sleep apnea)   . Acute pulmonary edema ([facility])   . Acute respiratory failure with hypoxia ([place]) 09/02/2019  . COPD exacerbation ([place]) 09/02/2019  . Essential hypertension 09/02/2019  . HLD (hyperlipidemia) 09/02/2019   PCP:  Marcie Mowers, FNP Pharmacy:   [id number] - [place], [address] [address] Phone: [phone] Fax: [phone]     Social Determinants of Health (SDOH) Interventions    Readmission Risk Interventions No flowsheet data found.

## 2019-09-04 NOTE — Progress Notes (Signed)
Asked patient when she wanted to go on her Bipap during 1st rounds, she stated she wanted to go on at 11pm.  I came back by after 11pm to see if she was ready to go on Bipap, patient stated she had just woke up and wanted to wait a while longer.  I told patient to let her RN know when she was ready and she would call me.  Will continue to monitor.

## 2019-09-04 NOTE — Progress Notes (Addendum)
NAME:  Kim [name], MRN:  [id number], DOB:  1961/01/03, LOS: 2 ADMISSION DATE:  09/01/2019, CONSULTATION DATE:  09/02/19 REFERRING MD:  Toniann Fail - TRH , CHIEF COMPLAINT:  Hypoxia   Brief History    History of present illness   58 yo F PMH Tobacco abuse, HTN, HLD, presents to ED 11/1 with SOB. SOB began 4 days prior to presentation and have associated s/sx of wheezing, productive cough. Denies chest pain, fever, chills, sick contacts. Treatment was initiated for suspected acute COPD exacerbation, including  Azithromycin nebulizers and steroids. PCCM consulted for hypoxia   COVID-19 negative  Past Medical History  Tobacco abuse HTN HLD   Significant Hospital Events   11/1> presents to ED with worsening SOB  11/2> admitted to hospitalist service. PCCM consulted.   Consults:  PCCM  Procedures:  11/3 2 D  Right Ventricle: The right ventricular size is mildly enlarged.Global RV systolic function is has normal systolic function.  Significant Diagnostic Tests:  11/1 CXR> hyper-expanded lungs. L basilar opacity felt to be consistent with lingular scarring   11/2 CTA chest> motion artifact present, no clear evidence of PE and possible filling defects are felt to be artifact related. Low lung volumes, bibasilar atelectasis.  Hepatic steatosis  Micro Data:  11/1 SARS CoV2 > neg 11/2 RVP >>> positive rhinovirus 11/2 sputum cx >>>   Antimicrobials:  Azithromycin 11/2>>   Interim history/subjective:  Unable to wear BiPAP during the night. Does not really appear to be in acute distress  Objective   Blood pressure (!) 125/56, pulse 95, temperature 98.4 F (36.9 C), temperature source Axillary, resp. rate 17, height 5\' 6"  (1.676 m), weight 116 kg, SpO2 95 %.        Intake/Output Summary (Last 24 hours) at 09/04/2019 1031 Last data filed at 09/04/2019 0240 Gross per 24 hour  Intake 440 ml  Output 600 ml  Net -160 ml   Filed Weights   09/01/19 1915 09/02/19 13/02/20  Weight:  124.7 kg 116 kg    Examination: General: obese sleepy female. Did not use bipap  HEENT: MM pink/moist Neuro: intact CV: hsr PULM:  Decreased bs GI: soft, bsx4 active  Extremities: warm/dry,1+ edema  Skin: no rashes or lesions    Resolved Hospital Problem list    Assessment & Plan:   Acute on chronic respiratory failure with hypoxia and hypercarbia positive rhinovirus possible AECOPD (COPD likely  underlying condition, no diagnosis of this in hx)  possible undiagnosed OSA vs OHS (patient and husband endorse hx consistent with apnea vs hypoventilation)  COVID-19 negative  BNP 64.7 CXR suggestive of lingular scarring CTA chest without evidence of PE, however does reveal bibasilar atelectasis Body habitus/kyphosis limits mechanical phase to her hypercarbia hypoxia P Wean O2Wear bipap FU with Dr. 5093 as Opt Continue diuresis Wean steroids 2d noted Please call PCCM if needed    Tobacco Abuse 40+ years of tobacco use, 1-2 ppd  P Encouraged to stop smoking Nicotine patch  Hyperglycemia no known Hx DM acute hyperglycemia possibly in setting of steroids CBG (last 3)  No results for input(s): GLUCAP in the last 72 hours.   P Continue to follow  HTN HLD Per primary   Best practice:  Diet: Heart healthy  Pain/Anxiety/Delirium protocol (if indicated): na VAP protocol (if indicated): na  DVT prophylaxis: lovenox  GI prophylaxis: na Glucose control: monitor  Mobility: up with assistance  Code Status: Full  Family Communication: September 03, 2019 patient updated at bedside.  And refused  to wear BiPAP during the night.  11/4 did not wear bipap. Disposition: Progressive   Labs   CBC: Recent Labs  Lab 09/01/19 1947 09/01/19 1958 09/02/19 0046 09/02/19 0230 09/03/19 0405 09/04/19 0324  WBC 12.3*  --   --  12.2* 15.0* 16.4*  NEUTROABS 8.6*  --   --   --  12.8* 14.0*  HGB 15.4* 16.3* 15.0 15.1* 15.9* 14.4  HCT 50.1* 48.0* 44.0 49.0* 51.3* 47.7*  MCV 97.7   --   --  97.8 97.5 97.7  PLT 314  --   --  308 353 340    Basic Metabolic Panel: Recent Labs  Lab 09/01/19 1947  09/02/19 0046 09/02/19 0230 09/03/19 0405 09/03/19 0855 09/04/19 0324  NA 135   < > 137 137 137 137 138  K 4.3   < > 3.8 3.9 5.6* 4.3 3.6  CL 98  --   --  98 98 100 97*  CO2 26  --   --  29 26 28 31   GLUCOSE 96  --   --  127* 122* 191* 128*  BUN 11  --   --  10 15 16 18   CREATININE 0.95  --   --  0.99  1.00 1.10* 1.08* 0.98  CALCIUM 8.9  --   --  9.1 9.4 9.2 9.1   < > = values in this interval not displayed.   GFR: Estimated Creatinine Clearance: 81 mL/min (by C-G formula based on SCr of 0.98 mg/dL). Recent Labs  Lab 09/01/19 1947 09/02/19 0230 09/03/19 0405 09/04/19 0324  WBC 12.3* 12.2* 15.0* 16.4*    Liver Function Tests: No results for input(s): AST, ALT, ALKPHOS, BILITOT, PROT, ALBUMIN in the last 168 hours. No results for input(s): LIPASE, AMYLASE in the last 168 hours. No results for input(s): AMMONIA in the last 168 hours.  ABG    Component Value Date/Time   PHART 7.334 (L) 09/03/2019 0832   PCO2ART 61.1 (H) 09/03/2019 0832   PO2ART 60.2 (L) 09/03/2019 0832   HCO3 31.9 (H) 09/03/2019 0832   TCO2 35 (H) 09/02/2019 0046   O2SAT 90.4 09/03/2019 0832     Coagulation Profile: No results for input(s): INR, PROTIME in the last 168 hours.  Cardiac Enzymes: No results for input(s): CKTOTAL, CKMB, CKMBINDEX, TROPONINI in the last 168 hours.  HbA1C: No results found for: HGBA1C  CBG:  Brett CanalesSteve Minor ACNP [facility] P[facility]  Attending Note:  58 year old female with morbid obesity and kyphoscoliosis presenting with hypoxemic respiratory failure.  Did not wear BiPAP overnight.  On exam, diminished BS diffusely.  I reviewed CXR myself, pulmonary edema noted.  Discussed with PCCM-NP.  Acute on chronic hypoxemic respiratory failure:             - Needs BiPAP at night, likely trilogy at home             - Consult case management for arranging Trilogy  at home and ?the requirement for such             - Keep dry as able             - Weight loss  Atelectasis:             - Ambulate             - OOB to chair             - IS             -  Flutter valve  Acute pulmonary edema:             - Keep dry as able  OSA:             - BiPAP at night  PCCM will sign off, please call back if needed  Patient seen and examined, agree with above note.  I dictated the care and orders written for this patient under my direction.  Rush Farmer, M.D. [facility] Pulmonary/Critical Care Medicine.

## 2019-09-04 NOTE — Progress Notes (Signed)
PROGRESS NOTE  Val EagleConnie L [name] UJW:[id number] DOB: 08-28-1961 DOA: 09/01/2019 PCP: Kim [name], Kim K, FNP  HPI/Recap of past 24 hours: HPI from Dr Kim [name] Kim [name] is a 58 y.o. female with history of tobacco abuse, hypertension, hyperlipidemia presents to the ER because of worsening shortness of breath.  Patient states she has been short of breath the last 4 to 5 days.  Has been having wheezing and productive cough, denies any fever/chills, has been having chest tightness, no L sided chest pain. Given these worsening symptoms patient came to the ER. In the ED, patient was hypoxic requiring almost 100% nonrebreather.  Chest x-ray does not show anything acute.  EKG shows normal sinus rhythm. Pt was placed on nebulizer treatment and steroids. Admitted for acute respiratory failure secondary to COPD exacerbation. COVID-19 was negative.  Subjective: The patient was seen and examined this morning, laying in bed comfortable, still requiring 4 L of oxygen to maintain O2 sat greater than 92%. Still having shortness of breath, worsening with exertion. Continue to have cough,.  Denies any fever chills nausea vomiting Again was unable to tolerate BiPAP last night.  Assessment/Plan: Principal Problem:   Acute respiratory failure with hypoxia (HCC) Active Problems:   COPD exacerbation (HCC)   Essential hypertension   HLD (hyperlipidemia)   OSA (obstructive sleep apnea)   Acute pulmonary edema (HCC)  Acute hyperbaric, hypoxic respiratory failure likely 2/2 COPD exacerbation 2/2 Rhinovirus  -Shortness of breath improving, still requiring 4 L of oxygen -On IV steroids, weaning off -Continue empiric antibiotics of azithromycin  Was weaned off of a nonrebreather, currently on 4L of oxygen nasal cannula (doesn't use O2 at home) Afebrile, with leukocytosis (on steroids) ABG showed pH of 7.27, PCO2 71.3, PO2 90, bicarb 32.9 BNP 64.7, Troponin 5 Respiratory viral panel positive for  rhinovirus  Echo done 09/30/2019 showed EF of 70 to 75%, grade 1 diastolic dysfunction Chest x-ray unremarkable  CTA chest showed no PE, although noted respiratory motion artifact, low lung volumes with bibasilar atelectasis PCCM consulted, appreciate recs.  Suspect underlying OSA/OHS.  Sleep study as an outpatient.  ??  Interstitial lung disease, consider HRCT as an outpatient Continue steroids, azithromycin, inhalers/nebulizers Supplemental oxygen as needed BiPAP at night, currently unable to tolerate, keep encouraging patient  PT  Hypertension Stable Continue Norvasc  Hyperlipidemia Continue statins  Tobacco abuse Advised to quit Nicotine patch ordered  Morbid obesity Lifestyle modification advised    Estimated body mass index is 41.28 kg/m as calculated from the following:   Height as of this encounter: 5\' 6"  (1.676 m).   Weight as of this encounter: 116 kg.     Code Status: Full  Family Communication: None at bedside  Disposition Plan: To be determined   Consultants:  PCCM  Procedures:  None  Antimicrobials:  Azithromycin  DVT prophylaxis: Lovenox   Objective: Vitals:   09/04/19 0759 09/04/19 0822 09/04/19 1243 09/04/19 1432  BP:  (!) 125/56 (!) 113/45   Pulse:  95 87   Resp:  17 (!) 21   Temp:  98.4 F (36.9 C) 97.9 F (36.6 C)   TempSrc:  Axillary Axillary   SpO2: 94% 95% 93% 92%  Weight:      Height:        Intake/Output Summary (Last 24 hours) at 09/04/2019 1548 Last data filed at 09/04/2019 0240 Gross per 24 hour  Intake 440 ml  Output 600 ml  Net -160 ml   Filed Weights   09/01/19 1915 09/02/19  6433  Weight: 124.7 kg 116 kg    Exam: BP (!) 113/45 (BP Location: Left Wrist)    Pulse 87    Temp 97.9 F (36.6 C) (Axillary)    Resp (!) 21    Ht 5\' 6"  (1.676 m)    Wt 116 kg    SpO2 92%    BMI 41.28 kg/m    Physical Exam  Constitution:  Alert, cooperative, no distress,  Psychiatric: Normal and stable mood and affect,  cognition intact,   HEENT: Normocephalic, PERRL, otherwise with in Normal limits  Chest:Chest symmetric Cardio vascular:  S1/S2, RRR, No murmure, No Rubs or Gallops  pulmonary: Scattered rhonchi and wheezing auscultation bilaterally, mild labored breathing with shortness of breath,, negative for crackles Abdomen: Soft, non-tender, non-distended, bowel sounds,no masses, no organomegaly Muscular skeletal: Limited exam - in bed, able to move all 4 extremities, Normal strength,  Neuro: CNII-XII intact. , normal motor and sensation, reflexes intact  Extremities: No pitting edema lower extremities, +2 pulses  Skin: Dry, warm to touch, negative for any Rashes, No open wounds Wounds: per nursing documentation       Data Reviewed: CBC: Recent Labs  Lab 09/01/19 1947 09/01/19 1958 09/02/19 0046 09/02/19 0230 09/03/19 0405 09/04/19 0324  WBC 12.3*  --   --  12.2* 15.0* 16.4*  NEUTROABS 8.6*  --   --   --  12.8* 14.0*  HGB 15.4* 16.3* 15.0 15.1* 15.9* 14.4  HCT 50.1* 48.0* 44.0 49.0* 51.3* 47.7*  MCV 97.7  --   --  97.8 97.5 97.7  PLT 314  --   --  308 353 340   Basic Metabolic Panel: Recent Labs  Lab 09/01/19 1947  09/02/19 0046 09/02/19 0230 09/03/19 0405 09/03/19 0855 09/04/19 0324  NA 135   < > 137 137 137 137 138  K 4.3   < > 3.8 3.9 5.6* 4.3 3.6  CL 98  --   --  98 98 100 97*  CO2 26  --   --  29 26 28 31   GLUCOSE 96  --   --  127* 122* 191* 128*  BUN 11  --   --  10 15 16 18   CREATININE 0.95  --   --  0.99   1.00 1.10* 1.08* 0.98  CALCIUM 8.9  --   --  9.1 9.4 9.2 9.1   < > = values in this interval not displayed.  Thyroid Function Tests: Recent Labs    09/02/19 0230  TSH 3.029   Anemia Panel: No results for input(s): VITAMINB12, FOLATE, FERRITIN, TIBC, IRON, RETICCTPCT in the last 72 hours. Urine analysis: No results found for: COLORURINE, APPEARANCEUR, LABSPEC, PHURINE, GLUCOSEU, HGBUR, BILIRUBINUR, KETONESUR, PROTEINUR, UROBILINOGEN, NITRITE,  LEUKOCYTESUR Sepsis Labs: @LABRCNTIP (procalcitonin:4,lacticidven:4) Recent Results (from the past 240 hour(s))  SARS Coronavirus 2 by RT PCR (hospital order, performed in [facility] hospital lab) Nasopharyngeal Nasopharyngeal Swab     Status: None   Collection Time: 09/01/19  7:46 PM   Specimen: Nasopharyngeal Swab  Result Value Ref Range Status   SARS Coronavirus 2 NEGATIVE NEGATIVE Final    Comment: (NOTE) If result is NEGATIVE SARS-CoV-2 target nucleic acids are NOT DETECTED. The SARS-CoV-2 RNA is generally detectable in upper and lower  respiratory specimens during the acute phase of infection. The lowest  concentration of SARS-CoV-2 viral copies this assay can detect is 250  copies / mL. A negative result does not preclude SARS-CoV-2 infection  and should not be used as the  sole basis for treatment or other  patient management decisions.  A negative result may occur with  improper specimen collection / handling, submission of specimen other  than nasopharyngeal swab, presence of viral mutation(s) within the  areas targeted by this assay, and inadequate number of viral copies  (<250 copies / mL). A negative result must be combined with clinical  observations, patient history, and epidemiological information. If result is POSITIVE SARS-CoV-2 target nucleic acids are DETECTED. The SARS-CoV-2 RNA is generally detectable in upper and lower  respiratory specimens dur ing the acute phase of infection.  Positive  results are indicative of active infection with SARS-CoV-2.  Clinical  correlation with patient history and other diagnostic information is  necessary to determine patient infection status.  Positive results do  not rule out bacterial infection or co-infection with other viruses. If result is PRESUMPTIVE POSTIVE SARS-CoV-2 nucleic acids MAY BE PRESENT.   A presumptive positive result was obtained on the submitted specimen  and confirmed on repeat testing.  While 2019 novel  coronavirus  (SARS-CoV-2) nucleic acids may be present in the submitted sample  additional confirmatory testing may be necessary for epidemiological  and / or clinical management purposes  to differentiate between  SARS-CoV-2 and other Sarbecovirus currently known to infect humans.  If clinically indicated additional testing with an alternate test  methodology [phone]) is advised. The SARS-CoV-2 RNA is generally  detectable in upper and lower respiratory sp ecimens during the acute  phase of infection. The expected result is Negative. Fact Sheet for Patients:  StrictlyIdeas.no Fact Sheet for Healthcare Providers: BankingDealers.co.za This test is not yet approved or cleared by the [place] FDA and has been authorized for detection and/or diagnosis of SARS-CoV-2 by FDA under an Emergency Use Authorization (EUA).  This EUA will remain in effect (meaning this test can be used) for the duration of the COVID-19 declaration under Section 564(b)(1) of the Act, 21 U.S.C. section 360bbb-3(b)(1), unless the authorization is terminated or revoked sooner. Performed at [facility], [place] [address], [place], [place] [address]   MRSA PCR Screening     Status: None   Collection Time: 09/02/19  7:00 AM   Specimen: Nasopharyngeal  Result Value Ref Range Status   MRSA by PCR NEGATIVE NEGATIVE Final    Comment:        The GeneXpert MRSA Assay (FDA approved for NASAL specimens only), is one component of a comprehensive MRSA colonization surveillance program. It is not intended to diagnose MRSA infection nor to guide or monitor treatment for MRSA infections. Performed at [facility], [place] [address], [place], [place] [address]   Respiratory Panel by PCR     Status: Abnormal   Collection Time: 09/02/19 10:35 AM   Specimen: Nasopharyngeal Swab; Respiratory  Result Value Ref Range Status   Adenovirus NOT DETECTED NOT DETECTED Final    Coronavirus 229E NOT DETECTED NOT DETECTED Final    Comment: (NOTE) The Coronavirus on the Respiratory Panel, DOES NOT test for the novel  Coronavirus (2019 nCoV)    Coronavirus HKU1 NOT DETECTED NOT DETECTED Final   Coronavirus NL63 NOT DETECTED NOT DETECTED Final   Coronavirus OC43 NOT DETECTED NOT DETECTED Final   Metapneumovirus NOT DETECTED NOT DETECTED Final   Rhinovirus / Enterovirus DETECTED (A) NOT DETECTED Final   Influenza A NOT DETECTED NOT DETECTED Final   Influenza B NOT DETECTED NOT DETECTED Final   Parainfluenza Virus 1 NOT DETECTED NOT DETECTED Final   Parainfluenza  Virus 2 NOT DETECTED NOT DETECTED Final   Parainfluenza Virus 3 NOT DETECTED NOT DETECTED Final   Parainfluenza Virus 4 NOT DETECTED NOT DETECTED Final   Respiratory Syncytial Virus NOT DETECTED NOT DETECTED Final   Bordetella pertussis NOT DETECTED NOT DETECTED Final   Chlamydophila pneumoniae NOT DETECTED NOT DETECTED Final   Mycoplasma pneumoniae NOT DETECTED NOT DETECTED Final    Comment: Performed at [facility], [address], [place], [place] [address]      Studies: No results found.  Scheduled Meds:  amLODipine  5 mg Oral Daily   aspirin EC  81 mg Oral Daily   budesonide (PULMICORT) nebulizer solution  0.25 mg Nebulization BID   enoxaparin (LOVENOX) injection  40 mg Subcutaneous Q24H   guaiFENesin-dextromethorphan  10 mL Oral QID   ipratropium-albuterol  3 mL Nebulization TID   methylPREDNISolone (SOLU-MEDROL) injection  40 mg Intravenous Q12H   nicotine  14 mg Transdermal Daily   pravastatin  20 mg Oral Daily    Continuous Infusions:  azithromycin 500 mg (09/04/19 0922)     LOS: 2 days    SIGNED: Kendell Bane, MD, FACP, FHM. [facility],  Pager [phone]  If 7PM-7AM, please contact night-coverage [email or web address] 09/04/2019, 3:48 PM

## 2019-09-05 ENCOUNTER — Inpatient Hospital Stay (HOSPITAL_COMMUNITY): Payer: Medicaid Other

## 2019-09-05 LAB — BASIC METABOLIC PANEL
Anion gap: 9 (ref 5–15)
BUN: 19 mg/dL (ref 6–20)
CO2: 33 mmol/L — ABNORMAL HIGH (ref 22–32)
Calcium: 9.2 mg/dL (ref 8.9–10.3)
Chloride: 98 mmol/L (ref 98–111)
Creatinine, Ser: 0.9 mg/dL (ref 0.44–1.00)
GFR calc Af Amer: >60 mL/min (ref >60)
GFR calc non Af Amer: >60 mL/min (ref >60)
Glucose, Bld: 116 mg/dL — ABNORMAL HIGH (ref 70–99)
Potassium: 4.5 mmol/L (ref 3.5–5.1)
Sodium: 140 mmol/L (ref 135–145)

## 2019-09-05 LAB — CBC WITH DIFFERENTIAL/PLATELET
Abs Immature Granulocytes: 0.16 10*3/uL — ABNORMAL HIGH (ref 0.00–0.07)
Basophils Absolute: 0 10*3/uL (ref 0.0–0.1)
Basophils Relative: 0 %
Eosinophils Absolute: 0 10*3/uL (ref 0.0–0.5)
Eosinophils Relative: 0 %
HCT: 44.6 % (ref 36.0–46.0)
Hemoglobin: 13.7 g/dL (ref 12.0–15.0)
Immature Granulocytes: 1 %
Lymphocytes Relative: 13 %
Lymphs Abs: 1.6 10*3/uL (ref 0.7–4.0)
MCH: 29.9 pg (ref 26.0–34.0)
MCHC: 30.7 g/dL (ref 30.0–36.0)
MCV: 97.4 fL (ref 80.0–100.0)
Monocytes Absolute: 0.5 10*3/uL (ref 0.1–1.0)
Monocytes Relative: 4 %
Neutro Abs: 9.5 10*3/uL — ABNORMAL HIGH (ref 1.7–7.7)
Neutrophils Relative %: 82 %
Platelets: 295 10*3/uL (ref 150–400)
RBC: 4.58 MIL/uL (ref 3.87–5.11)
RDW: 15.1 % (ref 11.5–15.5)
WBC: 11.8 10*3/uL — ABNORMAL HIGH (ref 4.0–10.5)
nRBC: 0 % (ref 0.0–0.2)

## 2019-09-05 LAB — BLOOD GAS, ARTERIAL
Acid-Base Excess: 8.9 mmol/L — ABNORMAL HIGH (ref 0.0–2.0)
Bicarbonate: 34.5 mmol/L — ABNORMAL HIGH (ref 20.0–28.0)
FIO2: 40
O2 Saturation: 97.4 %
Patient temperature: 37
pCO2 arterial: 62.8 mmHg — ABNORMAL HIGH (ref 32.0–48.0)
pH, Arterial: 7.358 (ref 7.350–7.450)
pO2, Arterial: 94.5 mmHg (ref 83.0–108.0)

## 2019-09-05 MED ORDER — BUDESONIDE 0.5 MG/2ML IN SUSP
0.5000 mg | Freq: Two times a day (BID) | RESPIRATORY_TRACT | Status: DC
  Administered 2019-09-05 – 2019-09-13 (×17): 0.5 mg via RESPIRATORY_TRACT
  Filled 2019-09-05 (×17): qty 2

## 2019-09-05 MED ORDER — CLONAZEPAM 0.5 MG PO TABS
0.5000 mg | ORAL_TABLET | Freq: Two times a day (BID) | ORAL | Status: DC
  Administered 2019-09-10: 0.5 mg via ORAL
  Filled 2019-09-05 (×13): qty 1

## 2019-09-05 MED ORDER — METHYLPREDNISOLONE SODIUM SUCC 125 MG IJ SOLR
60.0000 mg | Freq: Three times a day (TID) | INTRAMUSCULAR | Status: DC
  Administered 2019-09-05 – 2019-09-10 (×15): 60 mg via INTRAVENOUS
  Filled 2019-09-05 (×16): qty 2

## 2019-09-05 MED ORDER — VITAMIN C 500 MG PO TABS
500.0000 mg | ORAL_TABLET | Freq: Two times a day (BID) | ORAL | Status: DC
  Administered 2019-09-05 – 2019-09-07 (×5): 500 mg via ORAL
  Filled 2019-09-05 (×14): qty 1

## 2019-09-05 MED ORDER — METHYLPREDNISOLONE SODIUM SUCC 125 MG IJ SOLR: 60.0000 mg | Freq: Two times a day (BID) | INTRAMUSCULAR | Status: DC

## 2019-09-05 MED ORDER — ZINC SULFATE 220 (50 ZN) MG PO CAPS
220.0000 mg | ORAL_CAPSULE | Freq: Every day | ORAL | Status: DC
  Administered 2019-09-05: 220 mg via ORAL
  Filled 2019-09-05 (×8): qty 1

## 2019-09-05 NOTE — Progress Notes (Signed)
PROGRESS NOTE  Val EagleConnie L [name] ZOX:[id number] DOB: 1960-11-08 DOA: 09/01/2019 PCP: Nechama GuardNtibrey, Kim K, FNP  HPI/Recap of past 24 hours: HPI from Dr Kim RalphKakrakandy Kim [name] is a 58 y.o. female with history of tobacco abuse, hypertension, hyperlipidemia presents to the ER because of worsening shortness of breath.  Patient states she has been short of breath the last 4 to 5 days.  Has been having wheezing and productive cough, denies any fever/chills, has been having chest tightness, no L sided chest pain. Given these worsening symptoms patient came to the ER. In the ED, patient was hypoxic requiring almost 100% nonrebreather.  Chest x-ray does not show anything acute.  EKG shows normal sinus rhythm. Pt was placed on nebulizer treatment and steroids. Admitted for acute respiratory failure secondary to COPD exacerbation. COVID-19 was negative.  Subjective:  The patient was seen and examined this morning, stable overnight patient desatted, requiring 5 L of oxygen now.  Patient reports with a coughing spell her oxygenation got worse.    She is intolerable to CPAP/BiPAP This morning she dismantled BiPAP, desatted to 70s, back to 5 L of oxygen, currently satting in low 90s.     Assessment/Plan: Principal Problem:   Acute respiratory failure with hypoxia (HCC) Active Problems:   COPD exacerbation (HCC)   Essential hypertension   HLD (hyperlipidemia)   OSA (obstructive sleep apnea)   Acute pulmonary edema (HCC)  Acute hyperbaric, hypoxic respiratory failure likely 2/2 COPD exacerbation 2/2 Rhinovirus  Worsening respiratory failure -Requiring up to 5 L of oxygen now, still satting in low 90s  -We will increase IV Solu-Medrol to 60 every 8 hours, -Continuing  DuoNeb bronchodilator treatments -Initiating mucolytic's, anxiolytics (guaifenesin/Klonopin)   -Continue empiric antibiotics of azithromycin  Was weaned off of a nonrebreather, currently on 4L of oxygen nasal cannula (doesn't use  O2 at home) Afebrile, with leukocytosis (on steroids) ABG showed pH of 7.27, PCO2 71.3, PO2 90, bicarb 32.9 BNP 64.7, Troponin 5 Respiratory viral panel positive for rhinovirus  Echo done 09/30/2019 showed EF of 70 to 75%, grade 1 diastolic dysfunction Chest x-ray unremarkable  CTA chest showed no PE, although noted respiratory motion artifact, low lung volumes with bibasilar atelectasis  PCCM on board, appreciate assistance and recommendations  Suspect underlying OSA/OHS.  Sleep study as an outpatient. Possible interstitial lung disease, consider HRCT as an outpatient  Hypertension Stable Continue Norvasc  Hyperlipidemia Continue statins  Tobacco abuse Advised to quit Nicotine patch ordered  Morbid obesity Lifestyle modification advised    Estimated body mass index is 41.28 kg/m as calculated from the following:   Height as of this encounter: 5\' 6"  (1.676 m).   Weight as of this encounter: 116 kg.     Code Status: Full  Family Communication: None at bedside  Disposition Plan: To be determined   Consultants:  PCCM  Procedures:  None  Antimicrobials:  Azithromycin  DVT prophylaxis: Lovenox   Objective: Vitals:   09/05/19 0756 09/05/19 0835 09/05/19 1137 09/05/19 1321  BP:   (!) 113/56   Pulse:  88  90  Resp:  16  20  Temp: 97.7 F (36.5 C)  98.4 F (36.9 C)   TempSrc: Oral  Axillary   SpO2: 93%     Weight:      Height:        Intake/Output Summary (Last 24 hours) at 09/05/2019 1323 Last data filed at 09/05/2019 0800 Gross per 24 hour  Intake 20 ml  Output 1800 ml  Net -  1780 ml   Filed Weights   09/01/19 1915 09/02/19 0632  Weight: 124.7 kg 116 kg    Exam: BP (!) 113/56 (BP Location: Left Wrist)   Pulse 90   Temp 98.4 F (36.9 C) (Axillary)   Resp 20   Ht 5\' 6"  (1.676 m)   Wt 116 kg   SpO2 93%   BMI 41.28 kg/m    Physical Exam  BP (!) 113/56 (BP Location: Left Wrist)   Pulse 90   Temp 98.4 F (36.9 C) (Axillary)    Resp 20   Ht 5\' 6"  (1.676 m)   Wt 116 kg   SpO2 93%   BMI 41.28 kg/m    Physical Exam  Constitution:  Alert, cooperative, no distress,  Psychiatric: Normal and stable mood and affect, cognition intact,   HEENT: Normocephalic, PERRL, otherwise with in Normal limits  Chest:Chest symmetric Cardio vascular:  S1/S2, RRR, No murmure, No Rubs or Gallops  pulmonary: Persistent diffuse rhonchi and wheezing, positive Rales, negative any crackles Poor air exchange at lower lobes noted, poor air sounds at lower lobe Abdomen: Soft, non-tender, non-distended, bowel sounds,no masses, no organomegaly Muscular skeletal: Limited exam - in bed, able to move all 4 extremities, Normal strength,  Neuro: CNII-XII intact. , normal motor and sensation, reflexes intact  Extremities: No pitting edema lower extremities, +2 pulses  Skin: Dry, warm to touch, negative for any Rashes, No open wounds Wounds: per nursing documentation      Data Reviewed: CBC: Recent Labs  Lab 09/01/19 1947  09/02/19 0046 09/02/19 0230 09/03/19 0405 09/04/19 0324 09/05/19 0334  WBC 12.3*  --   --  12.2* 15.0* 16.4* 11.8*  NEUTROABS 8.6*  --   --   --  12.8* 14.0* 9.5*  HGB 15.4*   < > 15.0 15.1* 15.9* 14.4 13.7  HCT 50.1*   < > 44.0 49.0* 51.3* 47.7* 44.6  MCV 97.7  --   --  97.8 97.5 97.7 97.4  PLT 314  --   --  308 353 340 295   < > = values in this interval not displayed.   Basic Metabolic Panel: Recent Labs  Lab 09/02/19 0230 09/03/19 0405 09/03/19 0855 09/04/19 0324 09/05/19 0334  NA 137 137 137 138 140  K 3.9 5.6* 4.3 3.6 4.5  CL 98 98 100 97* 98  CO2 29 26 28 31  33*  GLUCOSE 127* 122* 191* 128* 116*  BUN 10 15 16 18 19   CREATININE 0.99  1.00 1.10* 1.08* 0.98 0.90  CALCIUM 9.1 9.4 9.2 9.1 9.2  Thyroid Function Tests: @LABRCNTIP (procalcitonin:4,lacticidven:4) Recent Results (from the past 240 hour(s))  SARS Coronavirus 2 by RT PCR (hospital order, performed in [facility] hospital lab)  Nasopharyngeal Nasopharyngeal Swab     Status: None   Collection Time: 09/01/19  7:46 PM   Specimen: Nasopharyngeal Swab  Result Value Ref Range Status   SARS Coronavirus 2 NEGATIVE NEGATIVE Final    Comment: (NOTE) If result is NEGATIVE SARS-CoV-2 target nucleic acids are NOT DETECTED. The SARS-CoV-2 RNA is generally detectable in upper and lower  respiratory specimens during the acute phase of infection. The lowest  concentration of SARS-CoV-2 viral copies this assay can detect is 250  copies / mL. A negative result does not preclude SARS-CoV-2 infection  and should not be used as the sole basis for treatment or other  patient management decisions.  A negative result may occur with  improper specimen collection / handling, submission of specimen other  than nasopharyngeal swab, presence of viral mutation(s) within the  areas targeted by this assay, and inadequate number of viral copies  (<2  MRSA PCR Screening     Status: None   Collection Time: 09/02/19  7:00 AM   Specimen: Nasopharyngeal  Result Value Ref Range Status   MRSA by PCR NEGATIVE NEGATIVE Final    Comment:        The GeneXpert MRSA Assay (FDA approved for NASAL specimens only), is one component of a comprehensive MRSA colonization surveillance program. It is not intended to diagnose MRSA infection nor to guide or monitor treatment for MRSA infections. Performed at [facility], [place] [address] [place], [place] [address]   Respiratory Panel by PCR     Status: Abnormal   Collection Time: 09/02/19 10:35 AM   Specimen: Nasopharyngeal Swab; Respiratory  Result Value Ref Range Status   Adenovirus NOT DETECTED NOT DETECTED Final   Coronavirus 229E NOT DETECTED NOT DETECTED Final    Comment: (NOTE) The Coronavirus on the Respiratory Panel, DOES NOT test for the novel  Coronavirus (2019 nCoV)    Coronavirus HKU1 NOT DETECTED NOT DETECTED Final   Coronavirus NL63 NOT DETECTED NOT DETECTED Final   Coronavirus  OC43 NOT DETECTED NOT DETECTED Final   Metapneumovirus NOT DETECTED NOT DETECTED Final   Rhinovirus / Enterovirus DETECTED (A) NOT DETECTED Final   Influenza A NOT DETECTED NOT DETECTED Final   Influenza B NOT DETECTED NOT DETECTED Final   Parainfluenza Virus 1 NOT DETECTED NOT DETECTED Final   Parainfluenza Virus 2 NOT DETECTED NOT DETECTED Final   Parainfluenza Virus 3 NOT DETECTED NOT DETECTED Final   Parainfluenza Virus 4 NOT DETECTED NOT DETECTED Final   Respiratory Syncytial Virus NOT DETECTED NOT DETECTED Final   Bordetella pertussis NOT DETECTED NOT DETECTED Final   Chlamydophila pneumoniae NOT DETECTED NOT DETECTED Final   Mycoplasma pneumoniae NOT DETECTED NOT DETECTED Final    Comment: Performed at [facility], [place]. [address] [place], [place] [address]      Studies: Dg Chest 2 View  Result Date: 09/05/2019 CLINICAL DATA:  Shortness of breath EXAM: CHEST - 2 VIEW COMPARISON:  09/01/2019 FINDINGS: Cardiomegaly. Both lungs are clear. Disc degenerative disease of the thoracic spine. IMPRESSION: Cardiomegaly without acute abnormality of the lungs. Electronically Signed   By: Eddie Candle M.D.   On: 09/05/2019 09:50    Scheduled Meds: . amLODipine  5 mg Oral Daily  . aspirin EC  81 mg Oral Daily  . budesonide (PULMICORT) nebulizer solution  0.5 mg Nebulization BID  . clonazePAM  0.5 mg Oral BID  . enoxaparin (LOVENOX) injection  40 mg Subcutaneous Q24H  . guaiFENesin-dextromethorphan  10 mL Oral QID  . ipratropium-albuterol  3 mL Nebulization TID  . methylPREDNISolone (SOLU-MEDROL) injection  60 mg Intravenous Q8H  . nicotine  14 mg Transdermal Daily  . pravastatin  20 mg Oral Daily  . vitamin C  500 mg Oral BID  . zinc sulfate  220 mg Oral Daily    Continuous Infusions: . azithromycin 500 mg (09/05/19 1015)     LOS: 3 days    SIGNED: Deatra James, MD, FACP, FHM. [facility],  Pager [phone]  If 7PM-7AM, please contact night-coverage  [email or web address], Password [facility] 09/05/2019, 1:23 PM

## 2019-09-05 NOTE — TOC Progression Note (Signed)
Transition of Care [facility]) - Progression Note    Patient Details  Name: Kim [name] MRN: [id number] Date of Birth: 08/02/1961  Transition of Care [facility]) CM/SW Contact  Maryclare Labrador, RN Phone Number: 09/05/2019, 3:28 PM  Clinical Narrative:  Pt chose [facility] - agency contacted and referral accepted tentatively awaiting orders.  CM requested [place] orders     Expected Discharge Plan: [place] Barriers to Discharge: Continued Medical Work up  Expected Discharge Plan and Services Expected Discharge Plan: [place] arrangements for the past 2 months: Single Family Home                                       Social Determinants of Health (SDOH) Interventions    Readmission Risk Interventions No flowsheet data found.

## 2019-09-05 NOTE — Progress Notes (Signed)
Pt had dismantled bipap mask stating that she "can't take it no more". Sats were in the 70's. Placed back on [place], 5L - o2 sats in 90's.

## 2019-09-05 NOTE — Progress Notes (Signed)
Spoke to patient about time she wanted to go on CPAP tonight.  Patient made the statement, "Do we have to do that again tonight?"  I told her that her doctors wanted her on it.  I did tell her that she did not want to be put on till 1:44 am this morning, and that she should let her RN know when she wanted to try it again.  Patient stated she would have them call me, provided she wanted to do it.  Will stop by again later this evening to see if she wants to try again.  Patient sat is 95% at this time, no distress noted, will continue to monitor.

## 2019-09-05 NOTE — Progress Notes (Signed)
Physical Therapy Treatment Patient Details Name: Kim [name] MRN: [id number] DOB: 1961-09-14 Today's Date: 09/05/2019    History of Present Illness 58 y.o. female with history of tobacco abuse, hypertension, hyperlipidemia presents to the ER because of worsening shortness of breath. In the ER patient was hypoxic requiring almost 100% nonrebreather. Admitted for acute respiratory failure with hypoxia and hypercarbia secondary to COPD 09/02/19    PT Comments    Patient seen for mobility progression. Pt is making progress toward PT goals and tolerated gait distance of 125 ft with RW and min guard assist for safety. VSS on 5L O2 via [place]. Pt with mild SOB while ambulating but reports improvement since last PT session. Pt will continue to benefit from further skilled PT services to maximize independence and safety with mobility.     Follow Up Recommendations  Home health PT;Supervision - Intermittent     Equipment Recommendations  Rolling walker with 5" wheels    Recommendations for Other Services       Precautions / Restrictions Precautions Precautions: Fall Restrictions Weight Bearing Restrictions: No    Mobility  Bed Mobility               General bed mobility comments: pt on BSC upon arrival and sitting EOB end of session  Transfers Overall transfer level: Needs assistance Equipment used: None Transfers: Sit to/from Stand Sit to Stand: Supervision         General transfer comment: supervision for safety  Ambulation/Gait Ambulation/Gait assistance: Min guard Gait Distance (Feet): 125 Feet Assistive device: Rolling walker (2 wheeled) Gait Pattern/deviations: Step-through pattern;Decreased step length - right;Decreased step length - left;Shuffle;Trunk flexed Gait velocity: decreased   General Gait Details: cues for upright posture, safe proximity to RW, and increased bilat step lengths   Stairs             Wheelchair Mobility    Modified Rankin  (Stroke Patients Only)       Balance Overall balance assessment: Needs assistance Sitting-balance support: Feet supported;No upper extremity supported Sitting balance-Leahy Scale: Good     Standing balance support: Single extremity supported;Bilateral upper extremity supported;During functional activity Standing balance-Leahy Scale: Fair Standing balance comment: pt able to static stand with single UE; bilat UE for gait                            Cognition Arousal/Alertness: Awake/alert Behavior During Therapy: WFL for tasks assessed/performed Overall Cognitive Status: Within Functional Limits for tasks assessed                                        Exercises      General Comments General comments (skin integrity, edema, etc.): VSS on 5L O2 via [place]      Pertinent Vitals/Pain Pain Assessment: No/denies pain    Home Living                      Prior Function            PT Goals (current goals can now be found in the care plan section) Acute Rehab PT Goals Patient Stated Goal: breathe easier Progress towards PT goals: Progressing toward goals    Frequency    Min 3X/week      PT Plan Current plan remains appropriate    Co-evaluation  AM-PAC PT "6 Clicks" Mobility   Outcome Measure  Help needed turning from your back to your side while in a flat bed without using bedrails?: None Help needed moving from lying on your back to sitting on the side of a flat bed without using bedrails?: None Help needed moving to and from a bed to a chair (including a wheelchair)?: None Help needed standing up from a chair using your arms (e.g., wheelchair or bedside chair)?: None Help needed to walk in hospital room?: A Little Help needed climbing 3-5 steps with a railing? : A Lot 6 Click Score: 21    End of Session Equipment Utilized During Treatment: Oxygen Activity Tolerance: Patient tolerated treatment well Patient  left: with call bell/phone within reach;in bed;Other (comment)(pt sitting EOB) Nurse Communication: Mobility status PT Visit Diagnosis: Unsteadiness on feet (R26.81);Other abnormalities of gait and mobility (R26.89);Muscle weakness (generalized) (M62.81);Difficulty in walking, not elsewhere classified (R26.2)     Time: 5462-7035 PT Time Calculation (min) (ACUTE ONLY): 25 min  Charges:  $Gait Training: 23-37 mins                     Erline Levine, PTA Acute Rehabilitation Services Pager: [phone] Office: [phone]     Carolynne Edouard 09/05/2019, 5:09 PM

## 2019-09-05 NOTE — Progress Notes (Signed)
Sats in high 70's, low 80's, not responding to more oxygen with nose mask. Pt mouth breathing in sleep and have periods of apnea during sleep for a few seconds. RT called

## 2019-09-05 NOTE — Progress Notes (Signed)
Sats in 70's - pt had loosened mask. Mask tightened, sats rising.

## 2019-09-06 LAB — CBC WITH DIFFERENTIAL/PLATELET
Abs Immature Granulocytes: 0.16 10*3/uL — ABNORMAL HIGH (ref 0.00–0.07)
Basophils Absolute: 0 10*3/uL (ref 0.0–0.1)
Basophils Relative: 0 %
Eosinophils Absolute: 0 10*3/uL (ref 0.0–0.5)
Eosinophils Relative: 0 %
HCT: 45.1 % (ref 36.0–46.0)
Hemoglobin: 13.6 g/dL (ref 12.0–15.0)
Immature Granulocytes: 2 %
Lymphocytes Relative: 12 %
Lymphs Abs: 1.1 10*3/uL (ref 0.7–4.0)
MCH: 29.6 pg (ref 26.0–34.0)
MCHC: 30.2 g/dL (ref 30.0–36.0)
MCV: 98.3 fL (ref 80.0–100.0)
Monocytes Absolute: 0.2 10*3/uL (ref 0.1–1.0)
Monocytes Relative: 2 %
Neutro Abs: 8.2 10*3/uL — ABNORMAL HIGH (ref 1.7–7.7)
Neutrophils Relative %: 84 %
Platelets: 282 10*3/uL (ref 150–400)
RBC: 4.59 MIL/uL (ref 3.87–5.11)
RDW: 14.7 % (ref 11.5–15.5)
WBC: 9.8 10*3/uL (ref 4.0–10.5)
nRBC: 0 % (ref 0.0–0.2)

## 2019-09-06 LAB — BASIC METABOLIC PANEL
Anion gap: 8 (ref 5–15)
BUN: 21 mg/dL — ABNORMAL HIGH (ref 6–20)
CO2: 32 mmol/L (ref 22–32)
Calcium: 9 mg/dL (ref 8.9–10.3)
Chloride: 96 mmol/L — ABNORMAL LOW (ref 98–111)
Creatinine, Ser: 0.96 mg/dL (ref 0.44–1.00)
GFR calc Af Amer: >60 mL/min (ref >60)
GFR calc non Af Amer: >60 mL/min (ref >60)
Glucose, Bld: 158 mg/dL — ABNORMAL HIGH (ref 70–99)
Potassium: 4.6 mmol/L (ref 3.5–5.1)
Sodium: 136 mmol/L (ref 135–145)

## 2019-09-06 NOTE — Progress Notes (Signed)
PROGRESS NOTE  Kim [name] VWU:[id number] DOB: Jan 07, 1961 DOA: 09/01/2019 PCP: Nechama Guard, FNP  HPI/Recap of past 24 hours: HPI from Dr Jerral Ralph is a 58 y.o. female with history of tobacco abuse, hypertension, hyperlipidemia presents to the ER because of worsening shortness of breath.  Patient states she has been short of breath the last 4 to 5 days.  Has been having wheezing and productive cough, denies any fever/chills, has been having chest tightness, no L sided chest pain. Given these worsening symptoms patient came to the ER. In the ED, patient was hypoxic requiring almost 100% nonrebreather.  Chest x-ray does not show anything acute.  EKG shows normal sinus rhythm. Pt was placed on nebulizer treatment and steroids. Admitted for acute respiratory failure secondary to COPD exacerbation. COVID-19 was negative.  Subjective:  The patient was seen and examined this morning, stable remains on 4 L of oxygen satting 96%. For most of yesterday and overnight patient remained on respite distress was up to 5 L of oxygen, satting in low 90s. Patient continued to improve, stating shortness of breath better than yesterday.  Denies any chest pain. Continues to have persistent cough.     Assessment/Plan: Principal Problem:   Acute respiratory failure with hypoxia (HCC) Active Problems:   COPD exacerbation (HCC)   Essential hypertension   HLD (hyperlipidemia)   OSA (obstructive sleep apnea)   Acute pulmonary edema (HCC)  Acute hyperbaric, hypoxic respiratory failure likely 2/2 COPD exacerbation 2/2 Rhinovirus   -Shortness of breath continue to improve was on 5 L of oxygen last night now on 4 L, satting 96%. -On IV steroids, weaning off -Continue empiric antibiotics of azithromycin  Was weaned off of a nonrebreather, currently on 4L of oxygen nasal cannula (doesn't use O2 at home) Afebrile, with leukocytosis (on steroids) On admission : ABG  pH of 7.27, PCO2  71.3, PO2 90, bicarb 32.9 BNP 64.7, Troponin 5 Respiratory viral panel positive for rhinovirus  Echo done 09/30/2019 showed EF of 70 to 75%, grade 1 diastolic dysfunction Chest x-ray unremarkable  CTA chest showed no PE, although noted respiratory motion artifact, low lung volumes with bibasilar atelectasis PCCM consulted, appreciate recs.  Suspect underlying OSA/OHS.  Sleep study as an outpatient.  ??  Interstitial lung disease, consider HRCT as an outpatient Continue steroids, azithromycin, inhalers/nebulizers Supplemental oxygen as needed BiPAP at night, currently unable to tolerate, keep encouraging patient  PT  Hypertension Stable Continue Norvasc  Hyperlipidemia Continue statins  Tobacco abuse Advised to quit Nicotine patch ordered  Morbid obesity Lifestyle modification advised    Estimated body mass index is 41.28 kg/m as calculated from the following:   Height as of this encounter:  (1.676 m).   Weight as of this encounter: 116 kg.     Code Status: Full  Family Communication: None at bedside  Disposition Plan: To be determined   Consultants:  PCCM  Procedures:  None  Antimicrobials:  Azithromycin  DVT prophylaxis: Lovenox   Objective: Vitals:   09/05/19 2027 09/05/19 2330 09/06/19 0437 09/06/19 0719  BP: 127/61 (!) 121/39 131/62 (!) 158/81  Pulse: 82 72 66 71  Resp: Temp: 98 F (36.7 C) 98.4 F (36.9 C) 98.1 F (36.7 C) 97.6 F (36.4 C)  TempSrc: Oral Oral Oral Oral  SpO2: 96% 96% 95% 92%  Weight:      Height:       No intake or output data in the 24 hours  ending 09/06/19 1440 Filed Weights   09/01/19 1915 09/02/19 0632  Weight: 124.7 kg 116 kg    Exam: BP (!) 158/81 (BP Location: Left Wrist)   Pulse 71   Temp 97.6 F (36.4 C) (Oral)   Resp 17   Ht 5\' 6"  (1.676 m)   Wt 116 kg   SpO2 92%   BMI 41.28 kg/m    Physical Exam  Constitution:  Alert, cooperative, mild distress with shortness of breath,  currently on 4 L Psychiatric: Normal and stable mood and affect, cognition intact,   HEENT: Normocephalic, PERRL, otherwise with in Normal limits  Chest:Chest symmetric Cardio vascular:  S1/S2, RRR, No murmure, No Rubs or Gallops  pulmonary: Diffuse rhonchi, mild labored breathing with shortness of breath, otherwise positive breath sound diffusely, negative any crackles mild diffuse wheezing . Abdomen: Soft, non-tender, non-distended, bowel sounds,no masses, no organomegaly Muscular skeletal: Limited exam - in bed, able to move all 4 extremities, Normal strength,  Neuro: CNII-XII intact. , normal motor and sensation, reflexes intact  Extremities: No pitting edema lower extremities, +2 pulses  Skin: Dry, warm to touch, negative for any Rashes, No open wounds Wounds: per nursing documentation    Data Reviewed: CBC: Recent Labs  Lab 09/01/19 1947  09/02/19 0230 09/03/19 0405 09/04/19 0324 09/05/19 0334 09/06/19 0421  WBC 12.3*  --  12.2* 15.0* 16.4* 11.8* 9.8  NEUTROABS 8.6*  --   --  12.8* 14.0* 9.5* 8.2*  HGB 15.4*   < > 15.1* 15.9* 14.4 13.7 13.6  HCT 50.1*   < > 49.0* 51.3* 47.7* 44.6 45.1  MCV 97.7  --  97.8 97.5 97.7 97.4 98.3  PLT 314  --  308 353 340 295 282   < > = values in this interval not displayed.   Basic Metabolic Panel: Recent Labs  Lab 09/03/19 0405 09/03/19 0855 09/04/19 0324 09/05/19 0334 09/06/19 0421  NA 137 137 138 140 136  K 5.6* 4.3 3.6 4.5 4.6  CL 98 100 97* 98 96*  CO2 26 28 31  33* 32  GLUCOSE 122* 191* 128* 116* 158*  BUN 15 16 18 19  21*  CREATININE 1.10* 1.08* 0.98 0.90 0.96  CALCIUM 9.4 9.2 9.1 9.2 9.0  Thyroid Function Tests: No results for input(s): TSH, T4TOTAL, FREET4, T3FREE, THYROIDAB in the last 72 hours. Anemia Panel: No results for input(s): VITAMINB12, FOLATE, FERRITIN, TIBC, IRON, RETICCTPCT in the last 72 hours. Urine analysis: No results found for: COLORURINE, APPEARANCEUR, LABSPEC, PHURINE, GLUCOSEU, HGBUR, BILIRUBINUR,  KETONESUR, PROTEINUR, UROBILINOGEN, NITRITE, LEUKOCYTESUR Sepsis Labs: @LABRCNTIP (procalcitonin:4,lacticidven:4) Recent Results (from the past 240 hour(s))  SARS Coronavirus 2 by RT PCR (hospital order, performed in [facility] hospital lab) Nasopharyngeal Nasopharyngeal Swab     Status: None   Collection Time: 09/01/19  7:46 PM   Specimen: Nasopharyngeal Swab  Result Value Ref Range Status   SARS Coronavirus 2 NEGATIVE NEGATIVE Final    Comment: (NOTE) If result is NEGATIVE SARS-CoV-2 target nucleic acids are NOT DETECTED. The SARS-CoV-2 RNA is generally detectable in upper and lower  respiratory specimens during the acute phase of infection. The lowest  concentration of SARS-CoV-2 viral copies this assay can detect is 250  copies / mL. A negative result does not preclude SARS-CoV-2 infection  and should not be used as the sole basis for treatment or other  patient management decisions.  A negative result may occur with  improper specimen collection / handling, submission of specimen other  than nasopharyngeal swab, presence of viral  mutation(s) within the  areas targeted by this assay, and inadequate number of viral copies  (<250 copies / mL). A negative result must be combined with clinical  observations, patient history, and epidemiological information. If result is POSITIVE SARS-CoV-2 target nucleic acids are DETECTED. The SARS-CoV-2 RNA is generally detectable in upper and lower  respiratory specimens dur ing the acute phase of infection.  Positive  results are indicative of active infection with SARS-CoV-2.  Clinical  correlation with patient history and other diagnostic information is  necessary to determine patient infection status.  Positive results do  not rule out bacterial infection or co-infection with other viruses. If result is PRESUMPTIVE POSTIVE SARS-CoV-2 nucleic acids MAY BE PRESENT.   A presumptive positive result was obtained on the submitted specimen  and  confirmed on repeat testing.  While 2019 novel coronavirus  (SARS-CoV-2) nucleic acids may be present in the submitted sample  additional confirmatory testing may be necessary for epidemiological  and / or clinical management purposes  to differentiate between  SARS-CoV-2 and other Sarbecovirus currently known to infect humans.  If clinically indicated additional testing with an alternate test  methodology [phone]) is advised. The SARS-CoV-2 RNA is generally  detectable in upper and lower respiratory sp ecimens during the acute  phase of infection. The expected result is Negative. Fact Sheet for Patients:  BoilerBrush.com.cy Fact Sheet for Healthcare Providers: https://pope.com/ This test is not yet approved or cleared by the [place] FDA and has been authorized for detection and/or diagnosis of SARS-CoV-2 by FDA under an Emergency Use Authorization (EUA).  This EUA will remain in effect (meaning this test can be used) for the duration of the COVID-19 declaration under Section 564(b)(1) of the Act, 21 U.S.C. section 360bbb-3(b)(1), unless the authorization is terminated or revoked sooner. Performed at [facility], [address] [place], [place] [address]   MRSA PCR Screening     Status: None   Collection Time: 09/02/19  7:00 AM   Specimen: Nasopharyngeal  Result Value Ref Range Status   MRSA by PCR NEGATIVE NEGATIVE Final    Comment:        The GeneXpert MRSA Assay (FDA approved for NASAL specimens only), is one component of a comprehensive MRSA colonization surveillance program. It is not intended to diagnose MRSA infection nor to guide or monitor treatment for MRSA infections. Performed at [facility], [address] [place], [place] [address]   Respiratory Panel by PCR     Status: Abnormal   Collection Time: 09/02/19 10:35 AM   Specimen: Nasopharyngeal Swab; Respiratory  Result Value Ref Range Status    Adenovirus NOT DETECTED NOT DETECTED Final   Coronavirus 229E NOT DETECTED NOT DETECTED Final    Comment: (NOTE) The Coronavirus on the Respiratory Panel, DOES NOT test for the novel  Coronavirus (2019 nCoV)    Coronavirus HKU1 NOT DETECTED NOT DETECTED Final   Coronavirus NL63 NOT DETECTED NOT DETECTED Final   Coronavirus OC43 NOT DETECTED NOT DETECTED Final   Metapneumovirus NOT DETECTED NOT DETECTED Final   Rhinovirus / Enterovirus DETECTED (A) NOT DETECTED Final   Influenza A NOT DETECTED NOT DETECTED Final   Influenza B NOT DETECTED NOT DETECTED Final   Parainfluenza Virus 1 NOT DETECTED NOT DETECTED Final   Parainfluenza Virus 2 NOT DETECTED NOT DETECTED Final   Parainfluenza Virus 3 NOT DETECTED NOT DETECTED Final   Parainfluenza Virus 4 NOT DETECTED NOT DETECTED Final   Respiratory Syncytial Virus NOT DETECTED  NOT DETECTED Final   Bordetella pertussis NOT DETECTED NOT DETECTED Final   Chlamydophila pneumoniae NOT DETECTED NOT DETECTED Final   Mycoplasma pneumoniae NOT DETECTED NOT DETECTED Final    Comment: Performed at [facility], [address] [place], [place] [address]      Studies: No results found.  Scheduled Meds: . amLODipine  5 mg Oral Daily  . aspirin EC  81 mg Oral Daily  . budesonide (PULMICORT) nebulizer solution  0.5 mg Nebulization BID  . clonazePAM  0.5 mg Oral BID  . enoxaparin (LOVENOX) injection  40 mg Subcutaneous Q24H  . guaiFENesin-dextromethorphan  10 mL Oral QID  . ipratropium-albuterol  3 mL Nebulization TID  . methylPREDNISolone (SOLU-MEDROL) injection  60 mg Intravenous Q8H  . nicotine  14 mg Transdermal Daily  . pravastatin  20 mg Oral Daily  . vitamin C  500 mg Oral BID  . zinc sulfate  220 mg Oral Daily    Continuous Infusions: . azithromycin 500 mg (09/06/19 0845)     LOS: 4 days    SIGNED: Kendell BaneSeyed A Shahmehdi, MD, FACP, FHM. [facility],  Pager [phone]  If 7PM-7AM, please contact night-coverage  [email or web address], Password [email or web address] 09/06/2019, 2:40 PM

## 2019-09-06 NOTE — Progress Notes (Signed)
Physical Therapy Treatment Patient Details Name: Kim [name] MRN: [id number] DOB: 06-26-1961 Today's Date: 09/06/2019    History of Present Illness 58 y.o. female with history of tobacco abuse, hypertension, hyperlipidemia presents to the ER because of worsening shortness of breath. In the ER patient was hypoxic requiring almost 100% nonrebreather. Admitted for acute respiratory failure with hypoxia and hypercarbia secondary to COPD 09/02/19    PT Comments    Pt seated at [facility], initially apprehensive but ultimately agreeable to therapy.  Pt is limited in safe mobility by increased work of breathing, in presence of decreased strength and endurance. Pt is supervision for transfers and min guard for ambulation of 180 feet with RW. Pt is very pleased with being able to progress her ambulation today. D/c plans remain appropriate. PT will continue to follow acutely.   Follow Up Recommendations  Home health PT;Supervision - Intermittent     Equipment Recommendations  Rolling walker with 5" wheels    Recommendations for Other Services       Precautions / Restrictions Precautions Precautions: Fall Restrictions Weight Bearing Restrictions: No    Mobility  Bed Mobility               General bed mobility comments: seated EoB on entry  Transfers Overall transfer level: Needs assistance Equipment used: None Transfers: Sit to/from Stand Sit to Stand: Supervision         General transfer comment: supervision for safety  Ambulation/Gait Ambulation/Gait assistance: Min guard Gait Distance (Feet): 180 Feet Assistive device: Rolling walker (2 wheeled) Gait Pattern/deviations: Step-through pattern;Decreased step length - right;Decreased step length - left;Shuffle;Trunk flexed Gait velocity: decreased Gait velocity interpretation: <1.8 ft/sec, indicate of risk for recurrent falls General Gait Details: cues for upright posture, safe proximity to RW, and increased bilat step  lengths       Balance Overall balance assessment: Needs assistance Sitting-balance support: Feet supported;No upper extremity supported Sitting balance-Leahy Scale: Good     Standing balance support: Single extremity supported;Bilateral upper extremity supported;During functional activity Standing balance-Leahy Scale: Fair Standing balance comment: pt able to static stand with single UE; bilat UE for gait                            Cognition Arousal/Alertness: Awake/alert Behavior During Therapy: WFL for tasks assessed/performed Overall Cognitive Status: Within Functional Limits for tasks assessed                                           General Comments General comments (skin integrity, edema, etc.): VSS on 6L O2 via [place]      Pertinent Vitals/Pain Pain Assessment: No/denies pain           PT Goals (current goals can now be found in the care plan section) Acute Rehab PT Goals Patient Stated Goal: breathe easier PT Goal Formulation: With patient Time For Goal Achievement: 09/17/19 Potential to Achieve Goals: Good Progress towards PT goals: Progressing toward goals    Frequency    Min 3X/week      PT Plan Current plan remains appropriate       AM-PAC PT "6 Clicks" Mobility   Outcome Measure  Help needed turning from your back to your side while in a flat bed without using bedrails?: None Help needed moving from lying on your back to sitting on the  side of a flat bed without using bedrails?: None Help needed moving to and from a bed to a chair (including a wheelchair)?: None Help needed standing up from a chair using your arms (e.g., wheelchair or bedside chair)?: None Help needed to walk in hospital room?: A Little Help needed climbing 3-5 steps with a railing? : A Lot 6 Click Score: 21    End of Session Equipment Utilized During Treatment: Oxygen Activity Tolerance: Patient tolerated treatment well Patient left: with call  bell/phone within reach;in bed;Other (comment)(pt sitting EOB) Nurse Communication: Mobility status PT Visit Diagnosis: Unsteadiness on feet (R26.81);Other abnormalities of gait and mobility (R26.89);Muscle weakness (generalized) (M62.81);Difficulty in walking, not elsewhere classified (R26.2)     Time: 9563-8756 PT Time Calculation (min) (ACUTE ONLY): 22 min  Charges:  $Gait Training: 8-22 mins                     Isidro Monks B. Beverely Risen PT, DPT Acute Rehabilitation Services Pager [phone] Office [phone]    Elon Alas Fleet 09/06/2019, 2:40 PM

## 2019-09-06 NOTE — Progress Notes (Signed)
Went in to ask patient if she was going on CPAP tonight.  Patient stated that she did not use it last night and would not be using it tonight. No distress noted at time, will continue to monitor.

## 2019-09-07 LAB — CBC WITH DIFFERENTIAL/PLATELET
Abs Immature Granulocytes: 0.19 10*3/uL — ABNORMAL HIGH (ref 0.00–0.07)
Basophils Absolute: 0 10*3/uL (ref 0.0–0.1)
Basophils Relative: 0 %
Eosinophils Absolute: 0 10*3/uL (ref 0.0–0.5)
Eosinophils Relative: 0 %
HCT: 46.8 % — ABNORMAL HIGH (ref 36.0–46.0)
Hemoglobin: 14.6 g/dL (ref 12.0–15.0)
Immature Granulocytes: 2 %
Lymphocytes Relative: 11 %
Lymphs Abs: 1.2 10*3/uL (ref 0.7–4.0)
MCH: 29.6 pg (ref 26.0–34.0)
MCHC: 31.2 g/dL (ref 30.0–36.0)
MCV: 94.9 fL (ref 80.0–100.0)
Monocytes Absolute: 0.3 10*3/uL (ref 0.1–1.0)
Monocytes Relative: 3 %
Neutro Abs: 8.6 10*3/uL — ABNORMAL HIGH (ref 1.7–7.7)
Neutrophils Relative %: 84 %
Platelets: 315 10*3/uL (ref 150–400)
RBC: 4.93 MIL/uL (ref 3.87–5.11)
RDW: 14.6 % (ref 11.5–15.5)
WBC: 10.3 10*3/uL (ref 4.0–10.5)
nRBC: 0 % (ref 0.0–0.2)

## 2019-09-07 LAB — BASIC METABOLIC PANEL
Anion gap: 11 (ref 5–15)
BUN: 23 mg/dL — ABNORMAL HIGH (ref 6–20)
CO2: 30 mmol/L (ref 22–32)
Calcium: 9.5 mg/dL (ref 8.9–10.3)
Chloride: 97 mmol/L — ABNORMAL LOW (ref 98–111)
Creatinine, Ser: 1.06 mg/dL — ABNORMAL HIGH (ref 0.44–1.00)
GFR calc Af Amer: >60 mL/min (ref >60)
GFR calc non Af Amer: 58 mL/min — ABNORMAL LOW (ref >60)
Glucose, Bld: 176 mg/dL — ABNORMAL HIGH (ref 70–99)
Potassium: 4.9 mmol/L (ref 3.5–5.1)
Sodium: 138 mmol/L (ref 135–145)

## 2019-09-07 MED ORDER — IPRATROPIUM-ALBUTEROL 0.5-2.5 (3) MG/3ML IN SOLN: 3.0000 mL | Freq: Two times a day (BID) | RESPIRATORY_TRACT | Status: DC

## 2019-09-07 MED ORDER — IPRATROPIUM-ALBUTEROL 0.5-2.5 (3) MG/3ML IN SOLN
3.0000 mL | RESPIRATORY_TRACT | Status: DC
  Administered 2019-09-07 – 2019-09-08 (×3): 3 mL via RESPIRATORY_TRACT
  Filled 2019-09-07 (×3): qty 3

## 2019-09-07 NOTE — Progress Notes (Signed)
PROGRESS NOTE    MARCHIA DIGUGLIELMO  ZOX:[id number] DOB: 1961-01-28 DOA: 09/01/2019 PCP: Nechama Guard, FNP    Brief Narrative:  58 y.o.femalewithhistory of tobacco abuse, hypertension, hyperlipidemia presents to the ER because of worsening shortness of breath. Patient states she has been short of breath the last 4 to 5 days. Has been having wheezing and productive cough, denies any fever/chills, has been having chest tightness, no L sided chest pain. Given these worsening symptoms patient came to the ER. In the ED, patient was hypoxic requiring almost 100% nonrebreather. Chest x-ray does not show anything acute. EKG shows normal sinus rhythm. Pt was placed on nebulizer treatment and steroids. Admitted for acute respiratory failure secondary to COPD exacerbation. COVID-19 was negative  Assessment & Plan:   Principal Problem:   Acute respiratory failure with hypoxia (HCC) Active Problems:   COPD exacerbation (HCC)   Essential hypertension   HLD (hyperlipidemia)   OSA (obstructive sleep apnea)   Acute pulmonary edema (HCC)  Acute hyperbaric, hypoxic respiratory failure likely 2/2 COPD exacerbation 2/2 Rhinovirus  -Shortness of breath continue to improve was on 4L overnight, down to 2L this AM -On IV steroids -Still hypoxemic even on minimal movement and while still on O2 -Continue empiric antibiotics of azithromycin -Will increase neb tx frequency. Cont to wean O2 as tolerated -Educated on importance of cpap -Respiratory viral panel positive for rhinovirus -Echo done 09/30/2019 showed EF of 70 to 75%, grade 1 diastolic dysfunction -CTA chest neg for PE -PCCM consulted, appreciate recs.  Suspect underlying OSA/OHS.   -Recommend sleep study as an outpatient.  -Question if pt has underlying interstitial lung disease, consider HRCT as an outpatient  Hypertension -Continue Norvasc -Remains stable at this time  Hyperlipidemia -Continue statins as tolerated  Tobacco  abuse -Cessation done at bedside -Pt reports willingness to quit, provided pt uses nicotine patches -Continue with Nicotine patch  Morbid obesity -Lifestyle modification advised   Estimated body mass index is 41.28 kg/m as calculated from the following:   Height as of this encounter:  (1.676 m).   Weight as of this encounter: 116 kg.   DVT prophylaxis: Lovenox subQ Code Status: Full Family Communication: Pt in room, family at bedside Disposition Plan: Home when weaned off steroids and on minimal O2 support  Consultants:   PCCM  Procedures:     Antimicrobials: Anti-infectives (From admission, onward)   Start     Dose/Rate Route Frequency Ordered Stop   09/02/19 0800  azithromycin (ZITHROMAX) 500 mg in sodium chloride 0.9 % 250 mL IVPB     500 mg 250 mL/hr over 60 Minutes Intravenous Every 24 hours 09/02/19 0727 09/08/19 2359       Subjective: Very eager to go home  Objective: Vitals:   09/06/19 2042 09/07/19 0423 09/07/19 0802 09/07/19 1218  BP: (!) 143/72 (!) 147/76 (!) 149/76 (!) 147/69  Pulse: 86 74 68 73  Resp: Temp: 98.6 F (37 C) (!) 97.5 F (36.4 C) 97.7 F (36.5 C) 98.4 F (36.9 C)  TempSrc: Axillary Axillary Oral Oral  SpO2:  92% 93% 91%  Weight:      Height:        Intake/Output Summary (Last 24 hours) at 09/07/2019 1556 Last data filed at 09/07/2019 0231 Gross per 24 hour  Intake --  Output 200 ml  Net -200 ml   Filed Weights   09/01/19 1915 09/02/19 8119  Weight: 124.7 kg 116 kg    Examination:  General exam: Appears calm and comfortable  Respiratory system: decreased BS. Respiratory effort normal. Cardiovascular system: S1 & S2 heard, RRR Gastrointestinal system: Abdomen is nondistended, soft and nontender. No organomegaly or masses felt. Normal bowel sounds heard. Central nervous system: Alert and oriented. No focal neurological deficits. Extremities: Symmetric 5 x 5 power. Skin: No rashes, lesions Psychiatry:  Judgement and insight appear normal. Mood & affect appropriate.   Data Reviewed: I have personally reviewed following labs and imaging studies  CBC: Recent Labs  Lab 09/03/19 0405 09/04/19 0324 09/05/19 0334 09/06/19 0421 09/07/19 0340  WBC 15.0* 16.4* 11.8* 9.8 10.3  NEUTROABS 12.8* 14.0* 9.5* 8.2* 8.6*  HGB 15.9* 14.4 13.7 13.6 14.6  HCT 51.3* 47.7* 44.6 45.1 46.8*  MCV 97.5 97.7 97.4 98.3 94.9  PLT 353 340 295 282 315   Basic Metabolic Panel: Recent Labs  Lab 09/03/19 0855 09/04/19 0324 09/05/19 0334 09/06/19 0421 09/07/19 0340  NA 137 138 140 136 138  K 4.3 3.6 4.5 4.6 4.9  CL 100 97* 98 96* 97*  CO2 28 31 33* 32 30  GLUCOSE 191* 128* 116* 158* 176*  BUN 16 18 19  21* 23*  CREATININE 1.08* 0.98 0.90 0.96 1.06*  CALCIUM 9.2 9.1 9.2 9.0 9.5   GFR: Estimated Creatinine Clearance: 74.9 mL/min (A) (by C-G formula based on SCr of 1.06 mg/dL (H)). Liver Function Tests: No results for input(s): AST, ALT, ALKPHOS, BILITOT, PROT, ALBUMIN in the last 168 hours. No results for input(s): LIPASE, AMYLASE in the last 168 hours. No results for input(s): AMMONIA in the last 168 hours. Coagulation Profile: No results for input(s): INR, PROTIME in the last 168 hours. Cardiac Enzymes: No results for input(s): CKTOTAL, CKMB, CKMBINDEX, TROPONINI in the last 168 hours. BNP (last 3 results) No results for input(s): PROBNP in the last 8760 hours. HbA1C: No results for input(s): HGBA1C in the last 72 hours. CBG: No results for input(s): GLUCAP in the last 168 hours. Lipid Profile: No results for input(s): CHOL, HDL, LDLCALC, TRIG, CHOLHDL, LDLDIRECT in the last 72 hours. Thyroid Function Tests: No results for input(s): TSH, T4TOTAL, FREET4, T3FREE, THYROIDAB in the last 72 hours. Anemia Panel: No results for input(s): VITAMINB12, FOLATE, FERRITIN, TIBC, IRON, RETICCTPCT in the last 72 hours. Sepsis Labs: No results for input(s): PROCALCITON, LATICACIDVEN in the last 168  hours.  Recent Results (from the past 240 hour(s))  SARS Coronavirus 2 by RT PCR (hospital order, performed in [facility] hospital lab) Nasopharyngeal Nasopharyngeal Swab     Status: None   Collection Time: 09/01/19  7:46 PM   Specimen: Nasopharyngeal Swab  Result Value Ref Range Status   SARS Coronavirus 2 NEGATIVE NEGATIVE Final    Comment: (NOTE) If result is NEGATIVE SARS-CoV-2 target nucleic acids are NOT DETECTED. The SARS-CoV-2 RNA is generally detectable in upper and lower  respiratory specimens during the acute phase of infection. The lowest  concentration of SARS-CoV-2 viral copies this assay can detect is 250  copies / mL. A negative result does not preclude SARS-CoV-2 infection  and should not be used as the sole basis for treatment or other  patient management decisions.  A negative result may occur with  improper specimen collection / handling, submission of specimen other  than nasopharyngeal swab, presence of viral mutation(s) within the  areas targeted by this assay, and inadequate number of viral copies  (<250 copies / mL). A negative result must be combined with clinical  observations, patient history, and epidemiological information. If  result is POSITIVE SARS-CoV-2 target nucleic acids are DETECTED. The SARS-CoV-2 RNA is generally detectable in upper and lower  respiratory specimens dur ing the acute phase of infection.  Positive  results are indicative of active infection with SARS-CoV-2.  Clinical  correlation with patient history and other diagnostic information is  necessary to determine patient infection status.  Positive results do  not rule out bacterial infection or co-infection with other viruses. If result is PRESUMPTIVE POSTIVE SARS-CoV-2 nucleic acids MAY BE PRESENT.   A presumptive positive result was obtained on the submitted specimen  and confirmed on repeat testing.  While 2019 novel coronavirus  (SARS-CoV-2) nucleic acids may be present in the  submitted sample  additional confirmatory testing may be necessary for epidemiological  and / or clinical management purposes  to differentiate between  SARS-CoV-2 and other Sarbecovirus currently known to infect humans.  If clinically indicated additional testing with an alternate test  methodology [phone]) is advised. The SARS-CoV-2 RNA is generally  detectable in upper and lower respiratory sp ecimens during the acute  phase of infection. The expected result is Negative. Fact Sheet for Patients:  StrictlyIdeas.no Fact Sheet for Healthcare Providers: BankingDealers.co.za This test is not yet approved or cleared by the [place] FDA and has been authorized for detection and/or diagnosis of SARS-CoV-2 by FDA under an Emergency Use Authorization (EUA).  This EUA will remain in effect (meaning this test can be used) for the duration of the COVID-19 declaration under Section 564(b)(1) of the Act, 21 U.S.C. section 360bbb-3(b)(1), unless the authorization is terminated or revoked sooner. Performed at [facility], [place] [address], [place], [place] [address]   MRSA PCR Screening     Status: None   Collection Time: 09/02/19  7:00 AM   Specimen: Nasopharyngeal  Result Value Ref Range Status   MRSA by PCR NEGATIVE NEGATIVE Final    Comment:        The GeneXpert MRSA Assay (FDA approved for NASAL specimens only), is one component of a comprehensive MRSA colonization surveillance program. It is not intended to diagnose MRSA infection nor to guide or monitor treatment for MRSA infections. Performed at [facility], [place] [address] [place], [place] [address]   Respiratory Panel by PCR     Status: Abnormal   Collection Time: 09/02/19 10:35 AM   Specimen: Nasopharyngeal Swab; Respiratory  Result Value Ref Range Status   Adenovirus NOT DETECTED NOT DETECTED Final   Coronavirus 229E NOT DETECTED NOT DETECTED Final    Comment:  (NOTE) The Coronavirus on the Respiratory Panel, DOES NOT test for the novel  Coronavirus (2019 nCoV)    Coronavirus HKU1 NOT DETECTED NOT DETECTED Final   Coronavirus NL63 NOT DETECTED NOT DETECTED Final   Coronavirus OC43 NOT DETECTED NOT DETECTED Final   Metapneumovirus NOT DETECTED NOT DETECTED Final   Rhinovirus / Enterovirus DETECTED (A) NOT DETECTED Final   Influenza A NOT DETECTED NOT DETECTED Final   Influenza B NOT DETECTED NOT DETECTED Final   Parainfluenza Virus 1 NOT DETECTED NOT DETECTED Final   Parainfluenza Virus 2 NOT DETECTED NOT DETECTED Final   Parainfluenza Virus 3 NOT DETECTED NOT DETECTED Final   Parainfluenza Virus 4 NOT DETECTED NOT DETECTED Final   Respiratory Syncytial Virus NOT DETECTED NOT DETECTED Final   Bordetella pertussis NOT DETECTED NOT DETECTED Final   Chlamydophila pneumoniae NOT DETECTED NOT DETECTED Final   Mycoplasma pneumoniae NOT DETECTED NOT DETECTED Final    Comment: Performed at  [facility], [address], [place], [place] [address]     Radiology Studies: No results found.  Scheduled Meds:  amLODipine  5 mg Oral Daily   aspirin EC  81 mg Oral Daily   budesonide (PULMICORT) nebulizer solution  0.5 mg Nebulization BID   clonazePAM  0.5 mg Oral BID   enoxaparin (LOVENOX) injection  40 mg Subcutaneous Q24H   guaiFENesin-dextromethorphan  10 mL Oral QID   ipratropium-albuterol  3 mL Nebulization Q4H   methylPREDNISolone (SOLU-MEDROL) injection  60 mg Intravenous Q8H   nicotine  14 mg Transdermal Daily   pravastatin  20 mg Oral Daily   vitamin C  500 mg Oral BID   zinc sulfate  220 mg Oral Daily   Continuous Infusions:  azithromycin 500 mg (09/07/19 1301)     LOS: 5 days   Rickey Barbara, MD [facility] Pager On Amion  If 7PM-7AM, please contact night-coverage 09/07/2019, 3:56 PM

## 2019-09-08 LAB — CBC WITH DIFFERENTIAL/PLATELET
Abs Immature Granulocytes: 0.18 10*3/uL — ABNORMAL HIGH (ref 0.00–0.07)
Basophils Absolute: 0 10*3/uL (ref 0.0–0.1)
Basophils Relative: 0 %
Eosinophils Absolute: 0 10*3/uL (ref 0.0–0.5)
Eosinophils Relative: 0 %
HCT: 46.2 % — ABNORMAL HIGH (ref 36.0–46.0)
Hemoglobin: 14.7 g/dL (ref 12.0–15.0)
Immature Granulocytes: 2 %
Lymphocytes Relative: 10 %
Lymphs Abs: 1 10*3/uL (ref 0.7–4.0)
MCH: 30.3 pg (ref 26.0–34.0)
MCHC: 31.8 g/dL (ref 30.0–36.0)
MCV: 95.3 fL (ref 80.0–100.0)
Monocytes Absolute: 0.4 10*3/uL (ref 0.1–1.0)
Monocytes Relative: 4 %
Neutro Abs: 8.7 10*3/uL — ABNORMAL HIGH (ref 1.7–7.7)
Neutrophils Relative %: 84 %
Platelets: 295 10*3/uL (ref 150–400)
RBC: 4.85 MIL/uL (ref 3.87–5.11)
RDW: 14.6 % (ref 11.5–15.5)
WBC: 10.3 10*3/uL (ref 4.0–10.5)
nRBC: 0 % (ref 0.0–0.2)

## 2019-09-08 MED ORDER — IPRATROPIUM-ALBUTEROL 0.5-2.5 (3) MG/3ML IN SOLN
3.0000 mL | Freq: Three times a day (TID) | RESPIRATORY_TRACT | Status: DC
  Administered 2019-09-08 – 2019-09-13 (×17): 3 mL via RESPIRATORY_TRACT
  Filled 2019-09-08 (×18): qty 3

## 2019-09-08 NOTE — Progress Notes (Signed)
Patient refused CPAP for the night.  RT will continue to monitor. 

## 2019-09-08 NOTE — Progress Notes (Signed)
PROGRESS NOTE    Kim [name]  VQX:[id number] DOB: [birth year] DOA: 09/01/2019 PCP: Marcie Mowers, FNP    Brief Narrative:  58 y.o.femalewithhistory of tobacco abuse, hypertension, hyperlipidemia presents to the ER because of worsening shortness of breath. Patient states she has been short of breath the last 4 to 5 days. Has been having wheezing and productive cough, denies any fever/chills, has been having chest tightness, no L sided chest pain. Given these worsening symptoms patient came to the ER. In the ED, patient was hypoxic requiring almost 100% nonrebreather. Chest x-ray does not show anything acute. EKG shows normal sinus rhythm. Pt was placed on nebulizer treatment and steroids. Admitted for acute respiratory failure secondary to COPD exacerbation. COVID-19 was negative  Assessment & Plan:   Principal Problem:   Acute respiratory failure with hypoxia (HCC) Active Problems:   COPD exacerbation (HCC)   Essential hypertension   HLD (hyperlipidemia)   OSA (obstructive sleep apnea)   Acute pulmonary edema (HCC)  Acute hyperbaric, hypoxic respiratory failure likely 2/2 COPD exacerbation 2/2 Rhinovirus  -Shortness of breath continue to improve was on 4L overnight, down to 2L this AM -On IV steroids -Still hypoxemic even on minimal movement and while still on O2 -Continue empiric antibiotics of azithromycin -Will increase neb tx frequency. Cont to wean O2 as tolerated -Educated on importance of cpap -Respiratory viral panel positive for rhinovirus -Echo done 09/30/2019 showed EF of 70 to 00%, grade 1 diastolic dysfunction -CTA chest neg for PE -PCCM consulted, appreciate recs.  Suspect underlying OSA/OHS.   -Will recommend sleep study as outpt.  -Question if pt has underlying interstitial lung disease, consider HRCT as an outpatient -Still remains O2 dependent, difficult to maintain 88% on minimal O2 support  Hypertension -Continue Norvasc as tolerated -Remains  stable at this time  Hyperlipidemia -Continue statins as pt tolerates  Tobacco abuse -Cessation done at bedside -Pt reports willingness to quit, provided pt uses nicotine patches -Continue with Nicotine patch as tolerated  Morbid obesity -Lifestyle modification advised   Estimated body mass index is 41.28 kg/m as calculated from the following:   Height as of this encounter: 5\' 6"  (1.676 m).   Weight as of this encounter: 116 kg.   DVT prophylaxis: Lovenox subQ Code Status: Full Family Communication: Pt in room, family at bedside Disposition Plan: Home when weaned off steroids and on minimal O2 support  Consultants:   PCCM  Procedures:     Antimicrobials: Anti-infectives (From admission, onward)   Start     Dose/Rate Route Frequency Ordered Stop   09/02/19 0800  azithromycin (ZITHROMAX) 500 mg in sodium chloride 0.9 % 250 mL IVPB     500 mg 250 mL/hr over 60 Minutes Intravenous Every 24 hours 09/02/19 0727 09/08/19 1029      Subjective: Remains very eager to go home  Objective: Vitals:   09/08/19 0722 09/08/19 0744 09/08/19 1131 09/08/19 1409  BP:  140/67 (!) 149/75   Pulse:  74 82 (!) 114  Resp:  14 19 20   Temp:  98.2 F (36.8 C) 98.3 F (36.8 C)   TempSrc:  Oral Axillary   SpO2: 95% 95% (!) 89% 92%  Weight:      Height:        Intake/Output Summary (Last 24 hours) at 09/08/2019 1506 Last data filed at 09/08/2019 0500 Gross per 24 hour  Intake 0 ml  Output 200 ml  Net -200 ml   Filed Weights   09/01/19 1915 09/02/19 3491  Weight: 124.7 kg 116 kg    Examination: General exam: Awake, sitting in bed, in nad Respiratory system: Slightly increased respiratory effort, no audible wheezing Cardiovascular system: regular rate, perfused Gastrointestinal system: Soft, nondistended, positive BS Central nervous system: CN2-12 grossly intact, strength intact Extremities: Perfused, no clubbing Skin: Normal skin turgor, no notable skin lesions seen,  appears cyanotic Psychiatry: Mood normal // no visual hallucinations   Data Reviewed: I have personally reviewed following labs and imaging studies  CBC: Recent Labs  Lab 09/04/19 0324 09/05/19 0334 09/06/19 0421 09/07/19 0340 09/08/19 0459  WBC 16.4* 11.8* 9.8 10.3 10.3  NEUTROABS 14.0* 9.5* 8.2* 8.6* 8.7*  HGB 14.4 13.7 13.6 14.6 14.7  HCT 47.7* 44.6 45.1 46.8* 46.2*  MCV 97.7 97.4 98.3 94.9 95.3  PLT 340 295 282 315 295   Basic Metabolic Panel: Recent Labs  Lab 09/03/19 0855 09/04/19 0324 09/05/19 0334 09/06/19 0421 09/07/19 0340  NA 137 138 140 136 138  K 4.3 3.6 4.5 4.6 4.9  CL 100 97* 98 96* 97*  CO2 28 31 33* 32 30  GLUCOSE 191* 128* 116* 158* 176*  BUN 16 18 19  21* 23*  CREATININE 1.08* 0.98 0.90 0.96 1.06*  CALCIUM 9.2 9.1 9.2 9.0 9.5   GFR: Estimated Creatinine Clearance: 74.9 mL/min (A) (by C-G formula based on SCr of 1.06 mg/dL (H)). Liver Function Tests: No results for input(s): AST, ALT, ALKPHOS, BILITOT, PROT, ALBUMIN in the last 168 hours. No results for input(s): LIPASE, AMYLASE in the last 168 hours. No results for input(s): AMMONIA in the last 168 hours. Coagulation Profile: No results for input(s): INR, PROTIME in the last 168 hours. Cardiac Enzymes: No results for input(s): CKTOTAL, CKMB, CKMBINDEX, TROPONINI in the last 168 hours. BNP (last 3 results) No results for input(s): PROBNP in the last 8760 hours. HbA1C: No results for input(s): HGBA1C in the last 72 hours. CBG: No results for input(s): GLUCAP in the last 168 hours. Lipid Profile: No results for input(s): CHOL, HDL, LDLCALC, TRIG, CHOLHDL, LDLDIRECT in the last 72 hours. Thyroid Function Tests: No results for input(s): TSH, T4TOTAL, FREET4, T3FREE, THYROIDAB in the last 72 hours. Anemia Panel: No results for input(s): VITAMINB12, FOLATE, FERRITIN, TIBC, IRON, RETICCTPCT in the last 72 hours. Sepsis Labs: No results for input(s): PROCALCITON, LATICACIDVEN in the last 168 hours.   Recent Results (from the past 240 hour(s))  SARS Coronavirus 2 by RT PCR (hospital order, performed in [facility] hospital lab) Nasopharyngeal Nasopharyngeal Swab     Status: None   Collection Time: 09/01/19  7:46 PM   Specimen: Nasopharyngeal Swab  Result Value Ref Range Status   SARS Coronavirus 2 NEGATIVE NEGATIVE Final    Comment: (NOTE) If result is NEGATIVE SARS-CoV-2 target nucleic acids are NOT DETECTED. The SARS-CoV-2 RNA is generally detectable in upper and lower  respiratory specimens during the acute phase of infection. The lowest  concentration of SARS-CoV-2 viral copies this assay can detect is 250  copies / mL. A negative result does not preclude SARS-CoV-2 infection  and should not be used as the sole basis for treatment or other  patient management decisions.  A negative result may occur with  improper specimen collection / handling, submission of specimen other  than nasopharyngeal swab, presence of viral mutation(s) within the  areas targeted by this assay, and inadequate number of viral copies  (<250 copies / mL). A negative result must be combined with clinical  observations, patient history, and epidemiological information. If result  is POSITIVE SARS-CoV-2 target nucleic acids are DETECTED. The SARS-CoV-2 RNA is generally detectable in upper and lower  respiratory specimens dur ing the acute phase of infection.  Positive  results are indicative of active infection with SARS-CoV-2.  Clinical  correlation with patient history and other diagnostic information is  necessary to determine patient infection status.  Positive results do  not rule out bacterial infection or co-infection with other viruses. If result is PRESUMPTIVE POSTIVE SARS-CoV-2 nucleic acids MAY BE PRESENT.   A presumptive positive result was obtained on the submitted specimen  and confirmed on repeat testing.  While 2019 novel coronavirus  (SARS-CoV-2) nucleic acids may be present in the  submitted sample  additional confirmatory testing may be necessary for epidemiological  and / or clinical management purposes  to differentiate between  SARS-CoV-2 and other Sarbecovirus currently known to infect humans.  If clinically indicated additional testing with an alternate test  methodology [phone]([id number]) is advised. The SARS-CoV-2 RNA is generally  detectable in upper and lower respiratory sp ecimens during the acute  phase of infection. The expected result is Negative. Fact Sheet for Patients:  BoilerBrush.com.cyhttps://www.fda.gov/media/136312/download Fact Sheet for Healthcare Providers: https://pope.com/https://www.fda.gov/media/136313/download This test is not yet approved or cleared by the [place] FDA and has been authorized for detection and/or diagnosis of SARS-CoV-2 by FDA under an Emergency Use Authorization (EUA).  This EUA will remain in effect (meaning this test can be used) for the duration of the COVID-19 declaration under Section 564(b)(1) of the Act, 21 U.S.C. section 360bbb-3(b)(1), unless the authorization is terminated or revoked sooner. Performed at [facility], [address] [place], [place] [address]   MRSA PCR Screening     Status: None   Collection Time: 09/02/19  7:00 AM   Specimen: Nasopharyngeal  Result Value Ref Range Status   MRSA by PCR NEGATIVE NEGATIVE Final    Comment:        The GeneXpert MRSA Assay (FDA approved for NASAL specimens only), is one component of a comprehensive MRSA colonization surveillance program. It is not intended to diagnose MRSA infection nor to guide or monitor treatment for MRSA infections. Performed at [facility], [address] [place], [place] [address]   Respiratory Panel by PCR     Status: Abnormal   Collection Time: 09/02/19 10:35 AM   Specimen: Nasopharyngeal Swab; Respiratory  Result Value Ref Range Status   Adenovirus NOT DETECTED NOT DETECTED Final   Coronavirus 229E NOT DETECTED NOT DETECTED Final    Comment:  (NOTE) The Coronavirus on the Respiratory Panel, DOES NOT test for the novel  Coronavirus (2019 nCoV)    Coronavirus HKU1 NOT DETECTED NOT DETECTED Final   Coronavirus NL63 NOT DETECTED NOT DETECTED Final   Coronavirus OC43 NOT DETECTED NOT DETECTED Final   Metapneumovirus NOT DETECTED NOT DETECTED Final   Rhinovirus / Enterovirus DETECTED (A) NOT DETECTED Final   Influenza A NOT DETECTED NOT DETECTED Final   Influenza B NOT DETECTED NOT DETECTED Final   Parainfluenza Virus 1 NOT DETECTED NOT DETECTED Final   Parainfluenza Virus 2 NOT DETECTED NOT DETECTED Final   Parainfluenza Virus 3 NOT DETECTED NOT DETECTED Final   Parainfluenza Virus 4 NOT DETECTED NOT DETECTED Final   Respiratory Syncytial Virus NOT DETECTED NOT DETECTED Final   Bordetella pertussis NOT DETECTED NOT DETECTED Final   Chlamydophila pneumoniae NOT DETECTED NOT DETECTED Final   Mycoplasma pneumoniae NOT DETECTED NOT DETECTED Final    Comment: Performed at [facility], [address], [place], [place] [address]     Radiology Studies: No results found.  Scheduled Meds: . amLODipine  5 mg Oral Daily  . aspirin EC  81 mg Oral Daily  . budesonide (PULMICORT) nebulizer solution  0.5 mg Nebulization BID  . clonazePAM  0.5 mg Oral BID  . enoxaparin (LOVENOX) injection  40 mg Subcutaneous Q24H  . guaiFENesin-dextromethorphan  10 mL Oral QID  . ipratropium-albuterol  3 mL Nebulization TID  . methylPREDNISolone (SOLU-MEDROL) injection  60 mg Intravenous Q8H  . nicotine  14 mg Transdermal Daily  . pravastatin  20 mg Oral Daily  . vitamin C  500 mg Oral BID  . zinc sulfate  220 mg Oral Daily   Continuous Infusions:    LOS: 6 days   Rickey Barbara, MD [facility] Pager On Amion  If 7PM-7AM, please contact night-coverage 09/08/2019, 3:06 PM

## 2019-09-09 ENCOUNTER — Inpatient Hospital Stay (HOSPITAL_COMMUNITY): Payer: Medicaid Other

## 2019-09-09 LAB — CBC WITH DIFFERENTIAL/PLATELET
Abs Immature Granulocytes: 0.26 10*3/uL — ABNORMAL HIGH (ref 0.00–0.07)
Basophils Absolute: 0 10*3/uL (ref 0.0–0.1)
Basophils Relative: 0 %
Eosinophils Absolute: 0 10*3/uL (ref 0.0–0.5)
Eosinophils Relative: 0 %
HCT: 45.3 % (ref 36.0–46.0)
Hemoglobin: 14 g/dL (ref 12.0–15.0)
Immature Granulocytes: 2 %
Lymphocytes Relative: 8 %
Lymphs Abs: 0.9 10*3/uL (ref 0.7–4.0)
MCH: 29.9 pg (ref 26.0–34.0)
MCHC: 30.9 g/dL (ref 30.0–36.0)
MCV: 96.6 fL (ref 80.0–100.0)
Monocytes Absolute: 0.5 10*3/uL (ref 0.1–1.0)
Monocytes Relative: 4 %
Neutro Abs: 9.9 10*3/uL — ABNORMAL HIGH (ref 1.7–7.7)
Neutrophils Relative %: 86 %
Platelets: 273 10*3/uL (ref 150–400)
RBC: 4.69 MIL/uL (ref 3.87–5.11)
RDW: 15 % (ref 11.5–15.5)
WBC: 11.6 10*3/uL — ABNORMAL HIGH (ref 4.0–10.5)
nRBC: 0 % (ref 0.0–0.2)

## 2019-09-09 MED ORDER — FUROSEMIDE 10 MG/ML IJ SOLN
40.0000 mg | Freq: Once | INTRAMUSCULAR | Status: AC
  Administered 2019-09-09: 40 mg via INTRAVENOUS

## 2019-09-09 MED ORDER — FUROSEMIDE 10 MG/ML IJ SOLN
40.0000 mg | Freq: Two times a day (BID) | INTRAMUSCULAR | Status: DC
  Administered 2019-09-09 – 2019-09-12 (×6): 40 mg via INTRAVENOUS
  Filled 2019-09-09 (×7): qty 4

## 2019-09-09 NOTE — Progress Notes (Signed)
Physical Therapy Treatment Patient Details Name: Kim [name] MRN: [id number] DOB: 1961-05-08 Today's Date: 09/09/2019    History of Present Illness 58 y.o. female with history of tobacco abuse, hypertension, hyperlipidemia presents to the ER because of worsening shortness of breath. In the ER patient was hypoxic requiring almost 100% nonrebreather. Admitted for acute respiratory failure with hypoxia and hypercarbia secondary to COPD 09/02/19    PT Comments    On entry pt oxygen desaturation alarm sounding. Pt reports it has been going off all day. Pt SaO2 found to be 85%O2 on 5L O2 via HFNC. Pt instructed in pursed lipped breathing with minor improvement so supplemental O2 increased to 6L O2 via HFNC. SaO2 rebounded to low 90%s. Pt reports not feeling up to walking today as she is too fatigued, however is willing to come to side of bed for seated exercise. Pt is supervision for bed mobility. Pt completed exercise and was given incentive spirometer and educated on its use. Pt encouraged to use during commercials while watching TV. Pt in agreement. PT will continue to follow acutely. D/c plans remain appropriate.      Follow Up Recommendations  Home health PT;Supervision - Intermittent     Equipment Recommendations  Rolling walker with 5" wheels       Precautions / Restrictions Precautions Precautions: Fall Restrictions Weight Bearing Restrictions: No    Mobility  Bed Mobility Overal bed mobility: Needs Assistance Bed Mobility: Supine to Sit     Supine to sit: Supervision     General bed mobility comments: supervsion for safety       Balance Overall balance assessment: Needs assistance Sitting-balance support: Feet supported;No upper extremity supported Sitting balance-Leahy Scale: Good     Standing balance support: Single extremity supported;Bilateral upper extremity supported;During functional activity Standing balance-Leahy Scale: Fair Standing balance comment: pt  able to static stand with single UE; bilat UE for gait                            Cognition Arousal/Alertness: Awake/alert Behavior During Therapy: WFL for tasks assessed/performed Overall Cognitive Status: Within Functional Limits for tasks assessed                                        Exercises General Exercises - Lower Extremity Ankle Circles/Pumps: AROM;Both;15 reps;Seated Gluteal Sets: AROM;Both;10 reps;Seated Long Arc Quad: AROM;Both;10 reps;Seated Hip ABduction/ADduction: AROM;Both;10 reps;Seated Hip Flexion/Marching: AROM;Both;10 reps;Seated Other Exercises Other Exercises: Incentive Spirometer x10 max level 750 mL    General Comments General comments (skin integrity, edema, etc.): On entry pt SaO2 was 85%O2 on 5L O2 via HFNC, after short bout of pursed lipped breathing with improvement to 88%O2, increased O2 to 6L O2 via HFNC. Pt able to attain and maintain SaO2 91-94%O2      Pertinent Vitals/Pain Pain Assessment: Faces Faces Pain Scale: Hurts a little bit Pain Location: generalized with movement Pain Descriptors / Indicators: Grimacing Pain Intervention(s): Limited activity within patient's tolerance;Monitored during session;Repositioned           PT Goals (current goals can now be found in the care plan section) Acute Rehab PT Goals Patient Stated Goal: breathe easier PT Goal Formulation: With patient Time For Goal Achievement: 09/17/19 Potential to Achieve Goals: Good Progress towards PT goals: Not progressing toward goals - comment(feeling fatigue from chronic oxygen desaturation)    Frequency  Min 3X/week      PT Plan Current plan remains appropriate       AM-PAC PT "6 Clicks" Mobility   Outcome Measure  Help needed turning from your back to your side while in a flat bed without using bedrails?: None Help needed moving from lying on your back to sitting on the side of a flat bed without using bedrails?: None Help  needed moving to and from a bed to a chair (including a wheelchair)?: None Help needed standing up from a chair using your arms (e.g., wheelchair or bedside chair)?: None Help needed to walk in hospital room?: A Little Help needed climbing 3-5 steps with a railing? : A Lot 6 Click Score: 21    End of Session Equipment Utilized During Treatment: Oxygen Activity Tolerance: Patient tolerated treatment well Patient left: with call bell/phone within reach;in bed;Other (comment)(pt sitting EOB) Nurse Communication: Mobility status PT Visit Diagnosis: Unsteadiness on feet (R26.81);Other abnormalities of gait and mobility (R26.89);Muscle weakness (generalized) (M62.81);Difficulty in walking, not elsewhere classified (R26.2)     Time: 5277-8242 PT Time Calculation (min) (ACUTE ONLY): 20 min  Charges:  $Therapeutic Exercise: 8-22 mins                     Coriana Angello B. Beverely Risen PT, DPT Acute Rehabilitation Services Pager [phone] Office [phone]    Elon Alas Fleet 09/09/2019, 4:51 PM

## 2019-09-09 NOTE — Progress Notes (Signed)
PROGRESS NOTE    Kim [name]  BMW:[id number] DOB: [birth year] DOA: 09/01/2019 PCP: Nechama Guard, FNP    Brief Narrative:  58 y.o.femalewithhistory of tobacco abuse, hypertension, hyperlipidemia presents to the ER because of worsening shortness of breath. Patient states she has been short of breath the last 4 to 5 days. Has been having wheezing and productive cough, denies any fever/chills, has been having chest tightness, no L sided chest pain. Given these worsening symptoms patient came to the ER. In the ED, patient was hypoxic requiring almost 100% nonrebreather. Chest x-ray does not show anything acute. EKG shows normal sinus rhythm. Pt was placed on nebulizer treatment and steroids. Admitted for acute respiratory failure secondary to COPD exacerbation. COVID-19 was negative  Assessment & Plan:   Principal Problem:   Acute respiratory failure with hypoxia (HCC) Active Problems:   COPD exacerbation (HCC)   Essential hypertension   HLD (hyperlipidemia)   OSA (obstructive sleep apnea)   Acute pulmonary edema (HCC)  Acute hyperbaric, hypoxic respiratory failure likely 2/2 COPD exacerbation 2/2 Rhinovirus  -Shortness of breath continue to improve was on 4L overnight, down to 2L this AM -On IV steroids -Still hypoxemic even on minimal movement and while still on O2 -Continue empiric antibiotics of azithromycin -Will increase neb tx frequency. Cont to wean O2 as tolerated -Educated on importance of cpap -Respiratory viral panel positive for rhinovirus -Echo done 09/30/2019 showed EF of 70 to 75%, grade 1 diastolic dysfunction -CTA chest neg for PE -PCCM consulted, appreciate recs.  Suspect underlying OSA/OHS.   -Will recommend sleep study as outpt.  -Question if pt has underlying interstitial lung disease, consider HRCT as an outpatient -Still remains o2 dependent, currently 6LNC. CXR ordered and reviewed this AM. Suboptimal penetration given obesity, however some  vascular congestion likely present. Will give trial of IV lasix  Hypertension -Continue Norvasc as tolerated -Remains stable currently  Hyperlipidemia -Continue statins as pt tolerates  Tobacco abuse -Cessation done at bedside -Pt reports willingness to quit, provided pt uses nicotine patches -Continue with Nicotine patch as tolerated  Morbid obesity -Recommend diet/lifestyle modification -bag of potato chips and bottles of regular soda are currently in pt's bed   Estimated body mass index is 41.28 kg/m as calculated from the following:   Height as of this encounter:  (1.676 m).   Weight as of this encounter: 116 kg.   DVT prophylaxis: Lovenox subQ Code Status: Full Family Communication: Pt in room, family at bedside Disposition Plan: Anticipate d/c when weaned to minimal O2 supportt  Consultants:   PCCM  Procedures:     Antimicrobials: Anti-infectives (From admission, onward)   Start     Dose/Rate Route Frequency Ordered Stop   09/02/19 0800  azithromycin (ZITHROMAX) 500 mg in sodium chloride 0.9 % 250 mL IVPB     500 mg 250 mL/hr over 60 Minutes Intravenous Every 24 hours 09/02/19 0727 09/08/19 1029      Subjective: Questioning about going home  Objective: Vitals:   09/09/19 0829 09/09/19 0900 09/09/19 1233 09/09/19 1457  BP:   (!) 165/64   Pulse: 97 85 80   Resp: Temp:      TempSrc:      SpO2: (!) 88% 93% 91% 91%  Weight:      Height:        Intake/Output Summary (Last 24 hours) at 09/09/2019 1602 Last data filed at 09/09/2019 0919 Gross per 24 hour  Intake 400 ml  Output --  Net 400 ml   Filed Weights   09/01/19 1915 09/02/19 [id number]  Weight: 124.7 kg 116 kg    Examination: General exam: Conversant, in no acute distress Respiratory system: normal chest rise, clear, no audible wheezing Cardiovascular system: regular rhythm, s1-s2 Gastrointestinal system: Nondistended, nontender, morbidly obese Central nervous system: No  seizures, no tremors Extremities: No cyanosis, no joint deformities Skin: No rashes, no pallor Psychiatry: Affect normal // no auditory hallucinations   Data Reviewed: I have personally reviewed following labs and imaging studies  CBC: Recent Labs  Lab 09/05/19 0334 09/06/19 0421 09/07/19 0340 09/08/19 0459 09/09/19 0450  WBC 11.8* 9.8 10.3 10.3 11.6*  NEUTROABS 9.5* 8.2* 8.6* 8.7* 9.9*  HGB 13.7 13.6 14.6 14.7 14.0  HCT 44.6 45.1 46.8* 46.2* 45.3  MCV 97.4 98.3 94.9 95.3 96.6  PLT 295 282 315 295 273   Basic Metabolic Panel: Recent Labs  Lab 09/03/19 0855 09/04/19 0324 09/05/19 0334 09/06/19 0421 09/07/19 0340  NA 137 138 140 136 138  K 4.3 3.6 4.5 4.6 4.9  CL 100 97* 98 96* 97*  CO2 28 31 33* 32 30  GLUCOSE 191* 128* 116* 158* 176*  BUN 16 18 19  21* 23*  CREATININE 1.08* 0.98 0.90 0.96 1.06*  CALCIUM 9.2 9.1 9.2 9.0 9.5   GFR: Estimated Creatinine Clearance: 74.9 mL/min (A) (by C-G formula based on SCr of 1.06 mg/dL (H)). Liver Function Tests: No results for input(s): AST, ALT, ALKPHOS, BILITOT, PROT, ALBUMIN in the last 168 hours. No results for input(s): LIPASE, AMYLASE in the last 168 hours. No results for input(s): AMMONIA in the last 168 hours. Coagulation Profile: No results for input(s): INR, PROTIME in the last 168 hours. Cardiac Enzymes: No results for input(s): CKTOTAL, CKMB, CKMBINDEX, TROPONINI in the last 168 hours. BNP (last 3 results) No results for input(s): PROBNP in the last 8760 hours. HbA1C: No results for input(s): HGBA1C in the last 72 hours. CBG: No results for input(s): GLUCAP in the last 168 hours. Lipid Profile: No results for input(s): CHOL, HDL, LDLCALC, TRIG, CHOLHDL, LDLDIRECT in the last 72 hours. Thyroid Function Tests: No results for input(s): TSH, T4TOTAL, FREET4, T3FREE, THYROIDAB in the last 72 hours. Anemia Panel: No results for input(s): VITAMINB12, FOLATE, FERRITIN, TIBC, IRON, RETICCTPCT in the last 72  hours. Sepsis Labs: No results for input(s): PROCALCITON, LATICACIDVEN in the last 168 hours.  Recent Results (from the past 240 hour(s))  SARS Coronavirus 2 by RT PCR (hospital order, performed in [facility]) Nasopharyngeal Nasopharyngeal Swab     Status: None   Collection Time: 09/01/19  7:46 PM   Specimen: Nasopharyngeal Swab  Result Value Ref Range Status   SARS Coronavirus 2 NEGATIVE NEGATIVE Final    Comment: (NOTE) If result is NEGATIVE SARS-CoV-2 target nucleic acids are NOT DETECTED. The SARS-CoV-2 RNA is generally detectable in upper and lower  respiratory specimens during the acute phase of infection. The lowest  concentration of SARS-CoV-2 viral copies this assay can detect is 250  copies / mL. A negative result does not preclude SARS-CoV-2 infection  and should not be used as the sole basis for treatment or other  patient management decisions.  A negative result may occur with  improper specimen collection / handling, submission of specimen other  than nasopharyngeal swab, presence of viral mutation(s) within the  areas targeted by this assay, and inadequate number of viral copies  (<250 copies / mL). A negative result must be combined with clinical  observations, patient history, and epidemiological information. If result is POSITIVE SARS-CoV-2 target nucleic acids are DETECTED. The SARS-CoV-2 RNA is generally detectable in upper and lower  respiratory specimens dur ing the acute phase of infection.  Positive  results are indicative of active infection with SARS-CoV-2.  Clinical  correlation with patient history and other diagnostic information is  necessary to determine patient infection status.  Positive results do  not rule out bacterial infection or co-infection with other viruses. If result is PRESUMPTIVE POSTIVE SARS-CoV-2 nucleic acids MAY BE PRESENT.   A presumptive positive result was obtained on the submitted specimen  and confirmed on repeat  testing.  While 2019 novel coronavirus  (SARS-CoV-2) nucleic acids may be present in the submitted sample  additional confirmatory testing may be necessary for epidemiological  and / or clinical management purposes  to differentiate between  SARS-CoV-2 and other Sarbecovirus currently known to infect humans.  If clinically indicated additional testing with an alternate test  methodology [phone]) is advised. The SARS-CoV-2 RNA is generally  detectable in upper and lower respiratory sp ecimens during the acute  phase of infection. The expected result is Negative. Fact Sheet for Patients:  StrictlyIdeas.no Fact Sheet for Healthcare Providers: BankingDealers.co.za This test is not yet approved or cleared by the [place] FDA and has been authorized for detection and/or diagnosis of SARS-CoV-2 by FDA under an Emergency Use Authorization (EUA).  This EUA will remain in effect (meaning this test can be used) for the duration of the COVID-19 declaration under Section 564(b)(1) of the Act, 21 U.S.C. section 360bbb-3(b)(1), unless the authorization is terminated or revoked sooner. Performed at [facility], [place] [address] [place], [place] [address]   MRSA PCR Screening     Status: None   Collection Time: 09/02/19  7:00 AM   Specimen: Nasopharyngeal  Result Value Ref Range Status   MRSA by PCR NEGATIVE NEGATIVE Final    Comment:        The GeneXpert MRSA Assay (FDA approved for NASAL specimens only), is one component of a comprehensive MRSA colonization surveillance program. It is not intended to diagnose MRSA infection nor to guide or monitor treatment for MRSA infections. Performed at [facility], [place] [address], [place], [place] [address]   Respiratory Panel by PCR     Status: Abnormal   Collection Time: 09/02/19 10:35 AM   Specimen: Nasopharyngeal Swab; Respiratory  Result Value Ref Range Status   Adenovirus NOT  DETECTED NOT DETECTED Final   Coronavirus 229E NOT DETECTED NOT DETECTED Final    Comment: (NOTE) The Coronavirus on the Respiratory Panel, DOES NOT test for the novel  Coronavirus (2019 nCoV)    Coronavirus HKU1 NOT DETECTED NOT DETECTED Final   Coronavirus NL63 NOT DETECTED NOT DETECTED Final   Coronavirus OC43 NOT DETECTED NOT DETECTED Final   Metapneumovirus NOT DETECTED NOT DETECTED Final   Rhinovirus / Enterovirus DETECTED (A) NOT DETECTED Final   Influenza A NOT DETECTED NOT DETECTED Final   Influenza B NOT DETECTED NOT DETECTED Final   Parainfluenza Virus 1 NOT DETECTED NOT DETECTED Final   Parainfluenza Virus 2 NOT DETECTED NOT DETECTED Final   Parainfluenza Virus 3 NOT DETECTED NOT DETECTED Final   Parainfluenza Virus 4 NOT DETECTED NOT DETECTED Final   Respiratory Syncytial Virus NOT DETECTED NOT DETECTED Final   Bordetella pertussis NOT DETECTED NOT DETECTED Final   Chlamydophila pneumoniae NOT DETECTED NOT DETECTED Final   Mycoplasma pneumoniae NOT DETECTED NOT DETECTED  Final    Comment: Performed at [facility], [address] [place], [place] [address]     Radiology Studies: No results found.  Scheduled Meds:  amLODipine  5 mg Oral Daily   aspirin EC  81 mg Oral Daily   budesonide (PULMICORT) nebulizer solution  0.5 mg Nebulization BID   clonazePAM  0.5 mg Oral BID   enoxaparin (LOVENOX) injection  40 mg Subcutaneous Q24H   furosemide  40 mg Intravenous BID   guaiFENesin-dextromethorphan  10 mL Oral QID   ipratropium-albuterol  3 mL Nebulization TID   methylPREDNISolone (SOLU-MEDROL) injection  60 mg Intravenous Q8H   nicotine  14 mg Transdermal Daily   pravastatin  20 mg Oral Daily   vitamin C  500 mg Oral BID   zinc sulfate  220 mg Oral Daily   Continuous Infusions:    LOS: 7 days   Rickey Barbara, MD [facility] Pager On Amion  If 7PM-7AM, please contact night-coverage 09/09/2019, 4:02 PM

## 2019-09-09 NOTE — Progress Notes (Signed)
Patient continues to refuse CPAP machine for several days now.

## 2019-09-10 LAB — CBC WITH DIFFERENTIAL/PLATELET
Abs Immature Granulocytes: 0.32 10*3/uL — ABNORMAL HIGH (ref 0.00–0.07)
Basophils Absolute: 0 10*3/uL (ref 0.0–0.1)
Basophils Relative: 0 %
Eosinophils Absolute: 0 10*3/uL (ref 0.0–0.5)
Eosinophils Relative: 0 %
HCT: 48.4 % — ABNORMAL HIGH (ref 36.0–46.0)
Hemoglobin: 15.2 g/dL — ABNORMAL HIGH (ref 12.0–15.0)
Immature Granulocytes: 2 %
Lymphocytes Relative: 6 %
Lymphs Abs: 0.8 10*3/uL (ref 0.7–4.0)
MCH: 30.2 pg (ref 26.0–34.0)
MCHC: 31.4 g/dL (ref 30.0–36.0)
MCV: 96.2 fL (ref 80.0–100.0)
Monocytes Absolute: 0.5 10*3/uL (ref 0.1–1.0)
Monocytes Relative: 4 %
Neutro Abs: 12 10*3/uL — ABNORMAL HIGH (ref 1.7–7.7)
Neutrophils Relative %: 88 %
Platelets: 283 10*3/uL (ref 150–400)
RBC: 5.03 MIL/uL (ref 3.87–5.11)
RDW: 15 % (ref 11.5–15.5)
WBC: 13.6 10*3/uL — ABNORMAL HIGH (ref 4.0–10.5)
nRBC: 0 % (ref 0.0–0.2)

## 2019-09-10 LAB — BASIC METABOLIC PANEL
Anion gap: 11 (ref 5–15)
BUN: 28 mg/dL — ABNORMAL HIGH (ref 6–20)
CO2: 35 mmol/L — ABNORMAL HIGH (ref 22–32)
Calcium: 8.8 mg/dL — ABNORMAL LOW (ref 8.9–10.3)
Chloride: 90 mmol/L — ABNORMAL LOW (ref 98–111)
Creatinine, Ser: 1.14 mg/dL — ABNORMAL HIGH (ref 0.44–1.00)
GFR calc Af Amer: >60 mL/min (ref >60)
GFR calc non Af Amer: 53 mL/min — ABNORMAL LOW (ref >60)
Glucose, Bld: 221 mg/dL — ABNORMAL HIGH (ref 70–99)
Potassium: 5.5 mmol/L — ABNORMAL HIGH (ref 3.5–5.1)
Sodium: 136 mmol/L (ref 135–145)

## 2019-09-10 MED ORDER — METHYLPREDNISOLONE SODIUM SUCC 125 MG IJ SOLR
60.0000 mg | Freq: Two times a day (BID) | INTRAMUSCULAR | Status: DC
  Administered 2019-09-10 – 2019-09-11 (×2): 60 mg via INTRAVENOUS
  Filled 2019-09-10 (×2): qty 2

## 2019-09-10 NOTE — Progress Notes (Signed)
Physical Therapy Treatment Patient Details Name: Kim [name] MRN: [id number] DOB: 1961/02/21 Today's Date: 09/10/2019    History of Present Illness 58 y.o. female with history of tobacco abuse, hypertension, hyperlipidemia presents to the ER because of worsening shortness of breath. In the ER patient was hypoxic requiring almost 100% nonrebreather. Admitted for acute respiratory failure with hypoxia and hypercarbia secondary to COPD 09/02/19    PT Comments    Pt reports feeling slightly better, and willing to ambulate with therapy. Pt is making progress towards her goals but is severely limited in her safe mobility by oxygen desaturation (see General Comments) requiring 8L O2 via HFNC for maintaining SaO2 88-90% with ambulation. Pt requires constant cuing for pursed lipped breathing and in particular inhalation through nose. Pt is supervision with bed mobility, transfers and min guard for ambulation of 70 feet with RW. D/c plans remain appropriate at this time. PT will continue to follow acutely.    Follow Up Recommendations  Home health PT;Supervision - Intermittent     Equipment Recommendations  Rolling walker with 5" wheels       Precautions / Restrictions Precautions Precautions: Fall Restrictions Weight Bearing Restrictions: No    Mobility  Bed Mobility Overal bed mobility: Needs Assistance Bed Mobility: Supine to Sit     Supine to sit: Supervision     General bed mobility comments: supervsion for safety   Transfers Overall transfer level: Needs assistance Equipment used: None Transfers: Sit to/from Stand Sit to Stand: Supervision         General transfer comment: supervision for safety  Ambulation/Gait Ambulation/Gait assistance: Min guard Gait Distance (Feet): 70 Feet Assistive device: Rolling walker (2 wheeled) Gait Pattern/deviations: Step-through pattern;Decreased step length - right;Decreased step length - left;Shuffle;Trunk flexed Gait velocity:  decreased Gait velocity interpretation: <1.31 ft/sec, indicative of household ambulator General Gait Details: slow, shuffling gaitcues for upright posture, safe proximity to RW, and increased bilat step lengths       Balance Overall balance assessment: Needs assistance Sitting-balance support: Feet supported;No upper extremity supported Sitting balance-Leahy Scale: Good     Standing balance support: Single extremity supported;Bilateral upper extremity supported;During functional activity Standing balance-Leahy Scale: Fair Standing balance comment: pt able to static stand with single UE; bilat UE for gait                            Cognition Arousal/Alertness: Awake/alert Behavior During Therapy: WFL for tasks assessed/performed Overall Cognitive Status: Within Functional Limits for tasks assessed                                           General Comments General comments (skin integrity, edema, etc.): on entry pt on 4L O2 via [place] with SaO2 88%O2, with ambulation pt requiring increase in supplemental O2 to 8L O2 to maintain 88-90%O2, constant cuing for pursed lipped breathing and inhale through nose. Pt with limited ability to maintain breathing through nose. With return to seated EoB able to reduce supplemental O2 to 6L however with SaO2 88%O2, occassionally drops to 85%O2 requiring reminder to breathe through nose.      Pertinent Vitals/Pain Pain Assessment: Faces Faces Pain Scale: Hurts a little bit Pain Location: generalized with movement Pain Descriptors / Indicators: Grimacing Pain Intervention(s): Limited activity within patient's tolerance;Monitored during session;Repositioned  PT Goals (current goals can now be found in the care plan section) Acute Rehab PT Goals Patient Stated Goal: breathe easier PT Goal Formulation: With patient Time For Goal Achievement: 09/17/19 Potential to Achieve Goals: Good Progress towards PT goals:  Progressing toward goals    Frequency    Min 3X/week      PT Plan Current plan remains appropriate       AM-PAC PT "6 Clicks" Mobility   Outcome Measure  Help needed turning from your back to your side while in a flat bed without using bedrails?: None Help needed moving from lying on your back to sitting on the side of a flat bed without using bedrails?: None Help needed moving to and from a bed to a chair (including a wheelchair)?: None Help needed standing up from a chair using your arms (e.g., wheelchair or bedside chair)?: None Help needed to walk in hospital room?: A Little Help needed climbing 3-5 steps with a railing? : A Lot 6 Click Score: 21    End of Session Equipment Utilized During Treatment: Oxygen;Gait belt Activity Tolerance: Patient tolerated treatment well Patient left: with call bell/phone within reach;in bed;Other (comment)(pt sitting EOB) Nurse Communication: Mobility status PT Visit Diagnosis: Unsteadiness on feet (R26.81);Other abnormalities of gait and mobility (R26.89);Muscle weakness (generalized) (M62.81);Difficulty in walking, not elsewhere classified (R26.2)     Time: 1660-6301 PT Time Calculation (min) (ACUTE ONLY): 18 min  Charges:  $Gait Training: 8-22 mins                     Lenon Kuennen B. Beverely Risen PT, DPT Acute Rehabilitation Services Pager [phone] Office [phone]    Kim [name] 09/10/2019, 11:45 AM

## 2019-09-10 NOTE — Progress Notes (Signed)
PROGRESS NOTE    Kim [name]  QJJ:[id number] DOB: [birth year] DOA: 09/01/2019 PCP: Marcie Mowers, FNP    Brief Narrative:  58 y.o.femalewithhistory of tobacco abuse, hypertension, hyperlipidemia presents to the ER because of worsening shortness of breath. Patient states she has been short of breath the last 4 to 5 days. Has been having wheezing and productive cough, denies any fever/chills, has been having chest tightness, no L sided chest pain. Given these worsening symptoms patient came to the ER. In the ED, patient was hypoxic requiring almost 100% nonrebreather. Chest x-ray does not show anything acute. EKG shows normal sinus rhythm. Pt was placed on nebulizer treatment and steroids. Admitted for acute respiratory failure secondary to COPD exacerbation. COVID-19 was negative but rhinovirus was positive  Assessment & Plan:   Principal Problem:   Acute respiratory failure with hypoxia (HCC) Active Problems:   COPD exacerbation (HCC)   Essential hypertension   HLD (hyperlipidemia)   OSA (obstructive sleep apnea)   Acute pulmonary edema (HCC)  Acute hyperbaric, hypoxic respiratory failure likely 2/2 COPD exacerbation 2/2 Rhinovirus  -Shortness of breath continue to improve was on 4L overnight, down to 2L this AM -On IV steroids which we will begin weaning today -Still hypoxemic even on minimal movement and while still on O2 -Completed a course of of azithromycin -Will increase neb tx frequency. Cont to wean O2 as tolerated -Educated on importance of cpap -Respiratory viral panel positive for rhinovirus -Echo done 09/30/2019 showed EF of 70 to 48%, grade 1 diastolic dysfunction -CTA chest neg for PE -PCCM consulted, appreciate recs.  Suspect underlying OSA/OHS.  They have signed off and will follow up as an outpatient  -Will recommend sleep study as outpt.  -Question if pt has underlying interstitial lung disease, consider HRCT as an outpatient -Still remains o2  dependent, currently 6LNC. CXR ordered and reviewed previously. Suboptimal penetration given obesity, however some vascular congestion likely present. Will continue trial of IV lasix  Hypertension -Continue Norvasc as tolerated -Remains stable currently  Hyperlipidemia -Continue statins as pt tolerates  Tobacco abuse -Cessation done at bedside -Pt reports willingness to quit, provided pt uses nicotine patches -Continue with Nicotine patch as tolerated  Morbid obesity -Recommend diet/lifestyle modification   Estimated body mass index is 41.28 kg/m as calculated from the following:   Height as of this encounter: 5\' 6"  (1.676 m).   Weight as of this encounter: 116 kg.   DVT prophylaxis: Lovenox subQ Code Status: Full Family Communication: Pt in room, family at bedside Disposition Plan: Anticipate d/c when weaned to minimal O2 supportt  Consultants:   PCCM  Procedures:     Antimicrobials: Anti-infectives (From admission, onward)   Start     Dose/Rate Route Frequency Ordered Stop   09/02/19 0800  azithromycin (ZITHROMAX) 500 mg in sodium chloride 0.9 % 250 mL IVPB     500 mg 250 mL/hr over 60 Minutes Intravenous Every 24 hours 09/02/19 0727 09/08/19 1029      Subjective: Patient denies any shortness of breath at rest and states her cough is now become nonproductive.  Objective: Vitals:   09/10/19 0344 09/10/19 0744 09/10/19 0756 09/10/19 1137  BP: 137/80  (!) 126/57 137/88  Pulse: 76  80 90  Resp: 18  17 20   Temp: 97.8 F (36.6 C)  (!) 97.4 F (36.3 C) (!) 97.5 F (36.4 C)  TempSrc: Oral  Oral Oral  SpO2: 92% 91% 93% (!) 88%  Weight:      Height:  Intake/Output Summary (Last 24 hours) at 09/10/2019 1351 Last data filed at 09/10/2019 1200 Gross per 24 hour  Intake 880 ml  Output 6700 ml  Net -5820 ml   Filed Weights   09/01/19 1915 09/02/19 2993  Weight: 124.7 kg 116 kg    Examination: General exam: Conversant, in no acute distress  Respiratory system: Barrel-shaped chest, minimal rales diffusely, no wheezing Cardiovascular system: regular rhythm, s1-s2 Gastrointestinal system: Nondistended, nontender, morbidly obese Central nervous system: No seizures, no tremors Extremities: No cyanosis, no joint deformities, 1+ pitting edema in bilateral lower extremities Skin: No rashes, no pallor Psychiatry: Affect normal // no auditory hallucinations    LOS: 8 days   Rae Halsted, MD [facility] Pager On Amion  If 7PM-7AM, please contact night-coverage 09/10/2019, 1:51 PM

## 2019-09-10 NOTE — Procedures (Signed)
Patient refused CPAP tonight 

## 2019-09-11 LAB — CBC WITH DIFFERENTIAL/PLATELET
Abs Immature Granulocytes: 0.18 10*3/uL — ABNORMAL HIGH (ref 0.00–0.07)
Basophils Absolute: 0 10*3/uL (ref 0.0–0.1)
Basophils Relative: 0 %
Eosinophils Absolute: 0 10*3/uL (ref 0.0–0.5)
Eosinophils Relative: 0 %
HCT: 45.7 % (ref 36.0–46.0)
Hemoglobin: 14.5 g/dL (ref 12.0–15.0)
Immature Granulocytes: 2 %
Lymphocytes Relative: 8 %
Lymphs Abs: 0.8 10*3/uL (ref 0.7–4.0)
MCH: 30.2 pg (ref 26.0–34.0)
MCHC: 31.7 g/dL (ref 30.0–36.0)
MCV: 95.2 fL (ref 80.0–100.0)
Monocytes Absolute: 0.5 10*3/uL (ref 0.1–1.0)
Monocytes Relative: 5 %
Neutro Abs: 8.9 10*3/uL — ABNORMAL HIGH (ref 1.7–7.7)
Neutrophils Relative %: 85 %
Platelets: 243 10*3/uL (ref 150–400)
RBC: 4.8 MIL/uL (ref 3.87–5.11)
RDW: 14.7 % (ref 11.5–15.5)
WBC: 10.4 10*3/uL (ref 4.0–10.5)
nRBC: 0 % (ref 0.0–0.2)

## 2019-09-11 LAB — BASIC METABOLIC PANEL
Anion gap: 10 (ref 5–15)
BUN: 28 mg/dL — ABNORMAL HIGH (ref 6–20)
CO2: 38 mmol/L — ABNORMAL HIGH (ref 22–32)
Calcium: 8.9 mg/dL (ref 8.9–10.3)
Chloride: 88 mmol/L — ABNORMAL LOW (ref 98–111)
Creatinine, Ser: 1.06 mg/dL — ABNORMAL HIGH (ref 0.44–1.00)
GFR calc Af Amer: >60 mL/min (ref >60)
GFR calc non Af Amer: 58 mL/min — ABNORMAL LOW (ref >60)
Glucose, Bld: 180 mg/dL — ABNORMAL HIGH (ref 70–99)
Potassium: 3.8 mmol/L (ref 3.5–5.1)
Sodium: 136 mmol/L (ref 135–145)

## 2019-09-11 MED ORDER — METHYLPREDNISOLONE SODIUM SUCC 40 MG IJ SOLR
40.0000 mg | Freq: Two times a day (BID) | INTRAMUSCULAR | Status: DC
  Administered 2019-09-11 – 2019-09-12 (×2): 40 mg via INTRAVENOUS
  Filled 2019-09-11 (×2): qty 1

## 2019-09-11 NOTE — Progress Notes (Signed)
PROGRESS NOTE    Kim [name]  [id number]:[id number] DOB: August 24, 1961 DOA: 09/01/2019 PCP: Marcie Mowers, FNP    Brief Narrative:  58 y.o.femalewithhistory of tobacco abuse, hypertension, hyperlipidemia presents to the ER because of worsening shortness of breath. Patient states she has been short of breath the last 4 to 5 days. Has been having wheezing and productive cough, denies any fever/chills, has been having chest tightness, no L sided chest pain. Given these worsening symptoms patient came to the ER. In the ED, patient was hypoxic requiring almost 100% nonrebreather. Chest x-ray does not show anything acute. EKG shows normal sinus rhythm. Pt was placed on nebulizer treatment and steroids. Admitted for acute respiratory failure secondary to COPD exacerbation. COVID-19 was negative but rhinovirus was positive  Assessment & Plan:   Principal Problem:   Acute respiratory failure with hypoxia (HCC) Active Problems:   COPD exacerbation (HCC)   Essential hypertension   HLD (hyperlipidemia)   OSA (obstructive sleep apnea)   Acute pulmonary edema (HCC)  Acute hyperbaric, hypoxic respiratory failure likely 2/2 COPD exacerbation 2/2 Rhinovirus  -Shortness of breath continue to improve was on 4L overnight, down to 2L this AM -On IV steroids which we will continue to wean -Still hypoxemic even on minimal movement and while still on O2 -Completed a course of of azithromycin -Will increase neb tx frequency. Cont to wean O2 as tolerated -Educated on importance of cpap -Respiratory viral panel positive for rhinovirus -Echo done 09/30/2019 showed EF of 70 to 62%, grade 1 diastolic dysfunction -CTA chest neg for PE -PCCM consulted, appreciate recs.  Suspect underlying OSA/OHS.  They have signed off and will follow up as an outpatient  -Will recommend sleep study as outpt.  -Question if pt has underlying interstitial lung disease, consider HRCT as an outpatient -Still remains o2  dependent, currently 6LNC. CXR ordered and reviewed previously. Suboptimal penetration given obesity, however some vascular congestion likely present. Will continue trial of IV lasix -Increase activity  Hypertension -Continue Norvasc as tolerated -Remains stable currently  Hyperlipidemia -Continue statins as pt tolerates  Tobacco abuse -Cessation done at bedside -Pt reports willingness to quit, provided pt uses nicotine patches -Continue with Nicotine patch as tolerated  Morbid obesity -Recommend diet/lifestyle modification   Estimated body mass index is 41.28 kg/m as calculated from the following:   Height as of this encounter: 5\' 6"  (1.676 m).   Weight as of this encounter: 116 kg.   DVT prophylaxis: Lovenox subQ Code Status: Full Family Communication: Pt in room, family at bedside Disposition Plan: Anticipate d/c when weaned to minimal O2 supportt  Consultants:   PCCM  Procedures:     Antimicrobials: Anti-infectives (From admission, onward)   Start     Dose/Rate Route Frequency Ordered Stop   09/02/19 0800  azithromycin (ZITHROMAX) 500 mg in sodium chloride 0.9 % 250 mL IVPB     500 mg 250 mL/hr over 60 Minutes Intravenous Every 24 hours 09/02/19 0727 09/08/19 1029      Subjective: Patient continues to sit in bed and denies much of a cough.  She states her shortness of breath continues to improve and endorses good urine output.  Objective: Vitals:   09/11/19 0754 09/11/19 0757 09/11/19 1122 09/11/19 1348  BP: (!) 116/57  (!) 118/48   Pulse:   73 89  Resp: 14  16 17   Temp: 98.2 F (36.8 C)  97.7 F (36.5 C)   TempSrc: Oral  Oral   SpO2: 90% 92% 91% 92%  Weight:      Height:        Intake/Output Summary (Last 24 hours) at 09/11/2019 1500 Last data filed at 09/11/2019 1000 Gross per 24 hour  Intake 240 ml  Output 500 ml  Net -260 ml   Filed Weights   09/01/19 1915 09/02/19 5643  Weight: 124.7 kg 116 kg    Examination: General exam:  Conversant, in no acute distress Respiratory system: Barrel-shaped chest, minimal rales diffusely, no wheezing Cardiovascular system: regular rhythm, s1-s2 Gastrointestinal system: Nondistended, nontender, morbidly obese Central nervous system: No seizures, no tremors Extremities: No cyanosis, no joint deformities, 1+ pitting edema in bilateral lower extremities Skin: No rashes, no pallor Psychiatry: Affect normal // no auditory hallucinations    LOS: 9 days   Rae Halsted, MD [facility] Pager On Amion  If 7PM-7AM, please contact night-coverage 09/11/2019, 3:00 PM

## 2019-09-11 NOTE — Progress Notes (Signed)
Patient has refused the use of CPAP for the evening. Will continue to monitor pt.

## 2019-09-12 LAB — CBC WITH DIFFERENTIAL/PLATELET
Abs Immature Granulocytes: 0.22 10*3/uL — ABNORMAL HIGH (ref 0.00–0.07)
Basophils Absolute: 0 10*3/uL (ref 0.0–0.1)
Basophils Relative: 0 %
Eosinophils Absolute: 0 10*3/uL (ref 0.0–0.5)
Eosinophils Relative: 0 %
HCT: 48.1 % — ABNORMAL HIGH (ref 36.0–46.0)
Hemoglobin: 15.2 g/dL — ABNORMAL HIGH (ref 12.0–15.0)
Immature Granulocytes: 2 %
Lymphocytes Relative: 7 %
Lymphs Abs: 0.9 10*3/uL (ref 0.7–4.0)
MCH: 29.9 pg (ref 26.0–34.0)
MCHC: 31.6 g/dL (ref 30.0–36.0)
MCV: 94.5 fL (ref 80.0–100.0)
Monocytes Absolute: 0.9 10*3/uL (ref 0.1–1.0)
Monocytes Relative: 7 %
Neutro Abs: 11.3 10*3/uL — ABNORMAL HIGH (ref 1.7–7.7)
Neutrophils Relative %: 84 %
Platelets: 252 10*3/uL (ref 150–400)
RBC: 5.09 MIL/uL (ref 3.87–5.11)
RDW: 14.5 % (ref 11.5–15.5)
WBC: 13.3 10*3/uL — ABNORMAL HIGH (ref 4.0–10.5)
nRBC: 0 % (ref 0.0–0.2)

## 2019-09-12 LAB — BASIC METABOLIC PANEL
Anion gap: 10 (ref 5–15)
BUN: 26 mg/dL — ABNORMAL HIGH (ref 6–20)
CO2: 39 mmol/L — ABNORMAL HIGH (ref 22–32)
Calcium: 8.9 mg/dL (ref 8.9–10.3)
Chloride: 90 mmol/L — ABNORMAL LOW (ref 98–111)
Creatinine, Ser: 1.16 mg/dL — ABNORMAL HIGH (ref 0.44–1.00)
GFR calc Af Amer: >60 mL/min (ref >60)
GFR calc non Af Amer: 52 mL/min — ABNORMAL LOW (ref >60)
Glucose, Bld: 169 mg/dL — ABNORMAL HIGH (ref 70–99)
Potassium: 4.2 mmol/L (ref 3.5–5.1)
Sodium: 139 mmol/L (ref 135–145)

## 2019-09-12 MED ORDER — PREDNISONE 20 MG PO TABS
40.0000 mg | ORAL_TABLET | Freq: Every day | ORAL | Status: DC
  Administered 2019-09-12 – 2019-09-13 (×2): 40 mg via ORAL
  Filled 2019-09-12 (×2): qty 2

## 2019-09-12 NOTE — Progress Notes (Signed)
PROGRESS NOTE    Kim [name]  PFX:[id number] DOB: 1961/06/21 DOA: 09/01/2019 PCP: Marcie Mowers, FNP    Brief Narrative:  58 y.o.femalewithhistory of tobacco abuse, hypertension, hyperlipidemia presents to the ER because of worsening shortness of breath. Patient states she has been short of breath the last 4 to 5 days. Has been having wheezing and productive cough, denies any fever/chills, has been having chest tightness, no L sided chest pain. Given these worsening symptoms patient came to the ER. In the ED, patient was hypoxic requiring almost 100% nonrebreather. Chest x-ray does not show anything acute. EKG shows normal sinus rhythm. Pt was placed on nebulizer treatment and steroids. Admitted for acute respiratory failure secondary to COPD exacerbation. COVID-19 was negative but rhinovirus was positive  Assessment & Plan:   Principal Problem:   Acute respiratory failure with hypoxia (HCC) Active Problems:   COPD exacerbation (HCC)   Essential hypertension   HLD (hyperlipidemia)   OSA (obstructive sleep apnea)   Acute pulmonary edema (HCC)  Acute hyperbaric, hypoxic respiratory failure likely 2/2 COPD exacerbation 2/2 Rhinovirus  -Shortness of breath continue to improve was on 4L overnight, down to 3L this AM -On IV steroids which we will continue to wean off -Still hypoxemic even on minimal movement and while still on O2 -Completed a course of of azithromycin -Will increase neb tx frequency. Cont to wean O2 as tolerated -Educated on importance of cpap -Respiratory viral panel positive for rhinovirus -Echo done 09/30/2019 showed EF of 70 to 32%, grade 1 diastolic dysfunction -CTA chest neg for PE -PCCM consulted, appreciate recs.  Suspect underlying OSA/OHS.  They have signed off and will follow up as an outpatient  -Will recommend sleep study as outpt.  -Question if pt has underlying interstitial lung disease, consider HRCT as an outpatient -d/c IV Lasix  -Increase activity -6 minute walk test to evaluate for potential home O2 needs -needs HH for PT on d/c; hope to d/c tomorrow  Hypertension -Continue Norvasc as tolerated -Remains stable currently  Hyperlipidemia -Continue statins as pt tolerates  Tobacco abuse -Cessation done at bedside -Pt reports willingness to quit, provided pt uses nicotine patches -Continue with Nicotine patch as tolerated  Morbid obesity -Recommend diet/lifestyle modification   Estimated body mass index is 41.28 kg/m as calculated from the following:   Height as of this encounter: 5\' 6"  (1.676 m).   Weight as of this encounter: 116 kg.   DVT prophylaxis: Lovenox subQ Code Status: Full Family Communication: Pt in room, family at bedside Disposition Plan: Anticipate d/c tomorrow pending 6 minute walk test for home O2 needs  Consultants:   PCCM  Procedures:     Antimicrobials: Anti-infectives (From admission, onward)   Start     Dose/Rate Route Frequency Ordered Stop   09/02/19 0800  azithromycin (ZITHROMAX) 500 mg in sodium chloride 0.9 % 250 mL IVPB     500 mg 250 mL/hr over 60 Minutes Intravenous Every 24 hours 09/02/19 0727 09/08/19 1029      Subjective: No complaints and feels well. Would like to go home tomorrow.  Objective: Vitals:   09/12/19 0728 09/12/19 0752 09/12/19 1200 09/12/19 1421  BP:  (!) 130/54    Pulse:  74 84   Resp:  19 (!) 28   Temp:  97.8 F (36.6 C)    TempSrc:  Oral    SpO2: 96% 92% 92% 95%  Weight:      Height:       No intake or output  data in the 24 hours ending 09/12/19 1539 Filed Weights   09/01/19 1915 09/02/19 4944  Weight: 124.7 kg 116 kg    Examination: General exam: Conversant, in no acute distress Respiratory system: Barrel-shaped chest, no rales, no wheezing Cardiovascular system: regular rhythm, s1-s2 Gastrointestinal system: Nondistended, nontender, morbidly obese Central nervous system: No seizures, no tremors Extremities: No  cyanosis, no joint deformities, 1+ pitting edema in bilateral lower extremities Skin: No rashes, no pallor Psychiatry: Affect normal // no auditory hallucinations    LOS: 10 days   Rae Halsted, MD [facility] Pager On Amion  If 7PM-7AM, please contact night-coverage 09/12/2019, 3:39 PM

## 2019-09-12 NOTE — Progress Notes (Signed)
Asked patient what time she wanted to go on CPAP tonight and was she going to use it.  Patient stated she did not want to use it tonight.  No distress noted.  Patient sat good on [place] 4L.  Will continue to monitor.

## 2019-09-12 NOTE — Plan of Care (Signed)
  Problem: Clinical Measurements: Goal: Ability to maintain clinical measurements within normal limits will improve Outcome: Progressing Goal: Will remain free from infection Outcome: Progressing Goal: Diagnostic test results will improve Outcome: Progressing Goal: Respiratory complications will improve Outcome: Progressing Goal: Cardiovascular complication will be avoided Outcome: Progressing   Problem: Activity: Goal: Risk for activity intolerance will decrease Outcome: Progressing   Problem: Coping: Goal: Level of anxiety will decrease Outcome: Progressing   Problem: Safety: Goal: Ability to remain free from injury will improve Outcome: Progressing   Problem: Pain Managment: Goal: General experience of comfort will improve Outcome: Progressing   Problem: Skin Integrity: Goal: Risk for impaired skin integrity will decrease Outcome: Progressing

## 2019-09-13 LAB — BASIC METABOLIC PANEL
Anion gap: 11 (ref 5–15)
BUN: 26 mg/dL — ABNORMAL HIGH (ref 6–20)
CO2: 36 mmol/L — ABNORMAL HIGH (ref 22–32)
Calcium: 8.6 mg/dL — ABNORMAL LOW (ref 8.9–10.3)
Chloride: 92 mmol/L — ABNORMAL LOW (ref 98–111)
Creatinine, Ser: 0.89 mg/dL (ref 0.44–1.00)
GFR calc Af Amer: >60 mL/min (ref >60)
GFR calc non Af Amer: >60 mL/min (ref >60)
Glucose, Bld: 129 mg/dL — ABNORMAL HIGH (ref 70–99)
Potassium: 3.7 mmol/L (ref 3.5–5.1)
Sodium: 139 mmol/L (ref 135–145)

## 2019-09-13 LAB — CBC WITH DIFFERENTIAL/PLATELET
Abs Immature Granulocytes: 0.23 10*3/uL — ABNORMAL HIGH (ref 0.00–0.07)
Basophils Absolute: 0 10*3/uL (ref 0.0–0.1)
Basophils Relative: 0 %
Eosinophils Absolute: 0 10*3/uL (ref 0.0–0.5)
Eosinophils Relative: 0 %
HCT: 44.4 % (ref 36.0–46.0)
Hemoglobin: 14.4 g/dL (ref 12.0–15.0)
Immature Granulocytes: 2 %
Lymphocytes Relative: 12 %
Lymphs Abs: 1.7 10*3/uL (ref 0.7–4.0)
MCH: 30.1 pg (ref 26.0–34.0)
MCHC: 32.4 g/dL (ref 30.0–36.0)
MCV: 92.7 fL (ref 80.0–100.0)
Monocytes Absolute: 1.3 10*3/uL — ABNORMAL HIGH (ref 0.1–1.0)
Monocytes Relative: 9 %
Neutro Abs: 11.1 10*3/uL — ABNORMAL HIGH (ref 1.7–7.7)
Neutrophils Relative %: 77 %
Platelets: 232 10*3/uL (ref 150–400)
RBC: 4.79 MIL/uL (ref 3.87–5.11)
RDW: 14.5 % (ref 11.5–15.5)
WBC: 14.4 10*3/uL — ABNORMAL HIGH (ref 4.0–10.5)
nRBC: 0 % (ref 0.0–0.2)

## 2019-09-13 MED ORDER — PREDNISONE 20 MG PO TABS: 40.0000 mg | ORAL_TABLET | Freq: Every day | ORAL | 0 refills | Status: AC

## 2019-09-13 MED ORDER — BUDESONIDE 0.5 MG/2ML IN SUSP: 0.5000 mg | Freq: Two times a day (BID) | RESPIRATORY_TRACT | 1 refills | Status: DC

## 2019-09-13 MED ORDER — NICOTINE 14 MG/24HR TD PT24: 14.0000 mg | MEDICATED_PATCH | Freq: Every day | TRANSDERMAL | 0 refills | Status: DC

## 2019-09-13 NOTE — Progress Notes (Signed)
Physical Therapy Treatment Patient Details Name: Kim [name] MRN: [id number] DOB: [birth year] Today's Date: 09/13/2019    History of Present Illness 58 y.o. female with history of tobacco abuse, hypertension, hyperlipidemia presents to the ER because of worsening shortness of breath. In the ER patient was hypoxic requiring almost 100% nonrebreather. Admitted for acute respiratory failure with hypoxia and hypercarbia secondary to COPD 09/02/19    PT Comments    Pt is making good progress towards her goals and is looking forward to d/c home today. Pt is limited in safe mobility by increased O2 demand and decreased endurance. Pt currently supervision for bed mobility and transfers and min guard for ambulation of 160 feet with RW. Pt able to ambulate on 2L O2 with minor oxygen desaturation to 87%. Pt recovered to 92%O2 with pursed lipped breathing. Pt also able to progress to 1225mL with IS. D/c plans remain appropriate.     Follow Up Recommendations  Home health PT;Supervision - Intermittent     Equipment Recommendations  Rolling walker with 5" wheels       Precautions / Restrictions Precautions Precautions: Fall Restrictions Weight Bearing Restrictions: No    Mobility  Bed Mobility Overal bed mobility: Needs Assistance Bed Mobility: Supine to Sit     Supine to sit: Supervision     General bed mobility comments: supervsion for safety   Transfers Overall transfer level: Needs assistance Equipment used: None Transfers: Sit to/from Stand Sit to Stand: Supervision         General transfer comment: supervision for safety  Ambulation/Gait Ambulation/Gait assistance: Min guard Gait Distance (Feet): 160 Feet Assistive device: Rolling walker (2 wheeled) Gait Pattern/deviations: Step-through pattern;Decreased step length - right;Decreased step length - left;Shuffle;Trunk flexed Gait velocity: decreased Gait velocity interpretation: 1.31 - 2.62 ft/sec, indicative of  limited community ambulator General Gait Details: pt with increased velocity and stability with ambulation       Balance Overall balance assessment: Needs assistance Sitting-balance support: Feet supported;No upper extremity supported Sitting balance-Leahy Scale: Good     Standing balance support: Single extremity supported;Bilateral upper extremity supported;During functional activity Standing balance-Leahy Scale: Good Standing balance comment: pt can static stand and reach outside her BoS                            Cognition Arousal/Alertness: Awake/alert Behavior During Therapy: WFL for tasks assessed/performed Overall Cognitive Status: Within Functional Limits for tasks assessed                                           General Comments General comments (skin integrity, edema, etc.): On 2L O2 via [place] SaO2 at rest 96%O2, with ambulation dropped to 87%O2, however able to quickly recover to 92%O2 with pursed lipped breathing      Pertinent Vitals/Pain Pain Assessment: No/denies pain           PT Goals (current goals can now be found in the care plan section) Acute Rehab PT Goals Patient Stated Goal: breathe easier PT Goal Formulation: With patient Time For Goal Achievement: 09/17/19 Potential to Achieve Goals: Good Progress towards PT goals: Progressing toward goals    Frequency    Min 3X/week      PT Plan Current plan remains appropriate       AM-PAC PT "6 Clicks" Mobility   Outcome Measure  Help  needed turning from your back to your side while in a flat bed without using bedrails?: None Help needed moving from lying on your back to sitting on the side of a flat bed without using bedrails?: None Help needed moving to and from a bed to a chair (including a wheelchair)?: None Help needed standing up from a chair using your arms (e.g., wheelchair or bedside chair)?: None Help needed to walk in hospital room?: A Little Help needed  climbing 3-5 steps with a railing? : A Lot 6 Click Score: 21    End of Session Equipment Utilized During Treatment: Oxygen;Gait belt Activity Tolerance: Patient tolerated treatment well Patient left: with call bell/phone within reach;in bed;Other (comment)(longsitting in bed) Nurse Communication: Mobility status PT Visit Diagnosis: Unsteadiness on feet (R26.81);Other abnormalities of gait and mobility (R26.89);Muscle weakness (generalized) (M62.81);Difficulty in walking, not elsewhere classified (R26.2)     Time: 1202-1216 PT Time Calculation (min) (ACUTE ONLY): 14 min  Charges:  $Gait Training: 8-22 mins                     Mystic Labo B. Beverely Risen PT, DPT Acute Rehabilitation Services Pager [phone] Office [phone]    [place] Al[place] 09/13/2019, 12:59 PM

## 2019-09-13 NOTE — Progress Notes (Signed)
SATURATION QUALIFICATIONS: (This note is used to comply with regulatory documentation for home oxygen)  Patient Saturations on Room Air at Rest = 87%  Patient Saturations on 2L at Rest = 92%  Patient Saturations on 2L Liters of oxygen while Ambulating = 91%  Patient ambulated roughly 100 feet.

## 2019-09-13 NOTE — TOC Transition Note (Signed)
Transition of Care [facility]) - CM/SW Discharge Note   Patient Details  Name: Kim [name] MRN: [id number] Date of Birth: July 05, 1961  [facility] CM/SW Contact:  Maryclare Labrador, RN Phone Number: 09/13/2019, 1:33 PM   Clinical Narrative:   Pt will discharge home today.  Pt will discharge home with oxygen - pt in agreement.  CM offered choice - pt chose [facility] - agency contacted and referral accepted.    CM signing off      Barriers to Discharge: (IV lasix and IV Solumedrol just discontinued today11/12)   Patient Goals and CMS Choice   CMS Medicare.gov Compare Post Acute Care list provided to:: Patient Choice offered to / list presented to : Patient  Discharge Placement                       Discharge Plan and Services                DME Arranged: Oxygen DME Agency: [facility] Date DME Agency Contacted: 09/13/19 Time DME Agency Contacted: [phone] Representative spoke with at DME Agency: [place]: RN, PT [place] Agency: [place] Date [place]: 09/05/19 Time [place]: 1544 Representative spoke with at [place]: cory  Social Determinants of Health ([place]) Interventions     Readmission Risk Interventions No flowsheet data found.

## 2019-09-13 NOTE — Discharge Summary (Signed)
Physician Discharge Summary  Val EagleConnie L Seiden ZOX:[id number] DOB: 1961-09-17 DOA: 09/01/2019  PCP: Nechama GuardNtibrey, Daniel K, FNP  Admit date: 09/01/2019 Discharge date: 09/13/2019  Admitted From: Home Disposition: Home  Recommendations for Outpatient Follow-up:  1. Follow up with PCP in 1-2 weeks 2. Please obtain BMP/CBC in one week 3. Patient will benefit from an outpatient sleep study 4. Patient will benefit from outpatient high-resolution CT scan of the chest to evaluate for possible underlying interstitial lung disease  Home Health: Yes Equipment/Devices: Home oxygen  Discharge Condition: Stable CODE STATUS: Full Diet recommendation: Heart Healthy / Carb Modified / Regular / Dysphagia   Brief/Interim Summary: 58 y.o.femalewithhistory of tobacco abuse, hypertension, hyperlipidemia presents to the ER because of worsening shortness of breath. Patient states she has been short of breath the last 4 to 5 days. Has been having wheezing and productive cough, denies any fever/chills, has been having chest tightness, no L sided chest pain. Given these worsening symptoms patient came to the ER. In the ED, patient was hypoxic requiring almost 100% nonrebreather. Chest x-ray does not show anything acute. EKG shows normal sinus rhythm. Pt was placed on nebulizer treatment and steroids. Admitted for acute respiratory failure secondary to COPD exacerbation. COVID-19 was negative but rhinovirus was positive.  She completed an empiric course of azithromycin and then continued to improve with steroid therapy.  She did temporarily require some IV Lasix due to pulmonary edema however an echocardiogram was performed which revealed no concerns for reduced ejection fraction.  Of note, pulmonology strongly suspect that this patient either has OSA or OHS and recommends an outpatient sleep study.  Furthermore there was a question as to whether this patient has underlying interstitial lung disease for which an  outpatient high-resolution CT scan of the chest is recommended.  Prior to day of discharge patient performed a 6-minute walk test and it was determined that she would require 2 L of oxygen continuously at home and this was arranged prior to discharge.  She is to complete a course of oral prednisone at home to finish her steroid taper.  Discharge Diagnoses:  Principal Problem:   Acute respiratory failure with hypoxia (HCC) Active Problems:   COPD exacerbation (HCC)   Essential hypertension   HLD (hyperlipidemia)   OSA (obstructive sleep apnea)   Acute pulmonary edema [facility](HCC)    Discharge Instructions  Discharge Instructions    Diet - low sodium heart healthy   Complete by: As directed    Increase activity slowly   Complete by: As directed    Schedule Transitional Care Management Visit   Complete by: As directed    PCP within 1-2 weeks     Allergies as of 09/13/2019   No Known Allergies     Medication List    TAKE these medications   acetaminophen 500 MG tablet Commonly known as: TYLENOL Take 500 mg by mouth every 6 (six) hours as needed for headache (pain).   amLODipine 5 MG tablet Commonly known as: NORVASC Take 1 tablet (5 mg total) by mouth daily.   budesonide 0.5 MG/2ML nebulizer solution Commonly known as: PULMICORT Take 2 mLs (0.5 mg total) by nebulization 2 (two) times daily.   nicotine 14 mg/24hr patch Commonly known as: NICODERM CQ - dosed in mg/24 hours Place 1 patch (14 mg total) onto the skin daily. Start taking on: September 14, 2019   pravastatin 20 MG tablet Commonly known as: PRAVACHOL Take 20 mg by mouth at bedtime.   predniSONE 20 MG tablet  Commonly known as: DELTASONE Take 2 tablets (40 mg total) by mouth daily with breakfast for 3 days. Start taking on: September 14, 2019            Durable Medical Equipment  (From admission, onward)         Start     Ordered   09/13/19 1430  For home use only DME oxygen  Once    Question Answer  Comment  Length of Need 6 Months   Mode or (Route) Nasal cannula   Liters per Minute 2   Frequency Continuous (stationary and portable oxygen unit needed)   Oxygen delivery system Gas      09/13/19 1429          No Known Allergies  Consultations:  Critical care medicine   Procedures/Studies: Dg Chest 2 View  Result Date: 09/05/2019 CLINICAL DATA:  Shortness of breath EXAM: CHEST - 2 VIEW COMPARISON:  09/01/2019 FINDINGS: Cardiomegaly. Both lungs are clear. Disc degenerative disease of the thoracic spine. IMPRESSION: Cardiomegaly without acute abnormality of the lungs. Electronically Signed   By: Eddie Candle M.D.   On: 09/05/2019 09:50   Ct Angio Chest Pe W Or Wo Contrast  Result Date: 09/02/2019 CLINICAL DATA:  Shortness of breath EXAM: CT ANGIOGRAPHY CHEST WITH CONTRAST TECHNIQUE: Multidetector CT imaging of the chest was performed using the standard protocol during bolus administration of intravenous contrast. Multiplanar CT image reconstructions and MIPs were obtained to evaluate the vascular anatomy. CONTRAST:  3mL OMNIPAQUE IOHEXOL 350 MG/ML SOLN COMPARISON:  None. FINDINGS: Cardiovascular: Evaluation is limited by respiratory motion artifact.There are a few possible filling defects within segmental and subsegmental branches bilaterally, however these are favored to be secondary to artifact as opposed to true acute pulmonary emboli. There are atherosclerotic changes of the thoracic aorta and coronary arteries. Mediastinum/Nodes: --No mediastinal or hilar lymphadenopathy. --No axillary lymphadenopathy. --No supraclavicular lymphadenopathy. --Normal thyroid gland. --The esophagus is unremarkable Lungs/Pleura: The lung volumes are low. There is atelectasis at the lung bases bilaterally. There is no large pleural effusion. No pneumothorax. Upper Abdomen: There is hepatic steatosis. Musculoskeletal: No chest wall abnormality. No acute or significant osseous findings. Review of the MIP  images confirms the above findings. IMPRESSION: 1. Evaluation is limited by respiratory motion artifact. Given this limitation, no definite pulmonary embolism was identified. 2. Low lung volumes with bibasilar atelectasis. 3. Hepatic steatosis. Aortic Atherosclerosis (ICD10-I70.0).1 Electronically Signed   By: Constance Holster M.D.   On: 09/02/2019 05:13   Dg Chest Port 1 View  Result Date: 09/09/2019 CLINICAL DATA:  Shortness of breath. EXAM: PORTABLE CHEST 1 VIEW COMPARISON:  09/05/2019. FINDINGS: Stable cardiomegaly. Low lung volumes with mild basilar atelectasis. No pleural effusion or pneumothorax. No acute bony abnormality. IMPRESSION: 1.  Stable cardiomegaly. 2.  Low lung volumes with mild basilar atelectasis. Electronically Signed   By: Marcello Moores  Register   On: 09/09/2019 08:15   Dg Chest Portable 1 View  Result Date: 09/01/2019 CLINICAL DATA:  Pt BIB by [facility] for SOB, cough x3 days with diahreaa x4 days. States it is a sinus infection. 90% on Room air but refused nasal canula, will only tolerate 15L on NRB. Hx smoking, COPD, HTN, high cholesterol EXAM: PORTABLE CHEST 1 VIEW COMPARISON:  12/27/2017 FINDINGS: Cardiac silhouette is normal in size. No mediastinal or hilar masses. Lungs are hyperexpanded. There are prominent bronchovascular markings. Opacities noted at the anterior left lung base on the previous exam unchanged consistent with lingular scarring. No evidence of pneumonia or  pulmonary edema. No pleural effusion or pneumothorax. Skeletal structures are grossly intact. IMPRESSION: 1. No acute cardiopulmonary disease. Electronically Signed   By: Amie Portland M.D.   On: 09/01/2019 20:09   (Echo, Carotid, EGD, Colonoscopy, ERCP)    Subjective: Patient denies shortness of breath at rest and is excited about being discharged today.  Discharge Exam: Vitals:   09/13/19 1149 09/13/19 1427  BP: (!) 135/54   Pulse: 71   Resp: 18   Temp: 97.7 F (36.5 C)   SpO2: 94% 93%   Vitals:    09/13/19 0714 09/13/19 0808 09/13/19 1149 09/13/19 1427  BP:  (!) 135/59 (!) 135/54   Pulse:  87 71   Resp:  20 18   Temp:  98.2 F (36.8 C) 97.7 F (36.5 C)   TempSrc:  Oral Oral   SpO2: (!) 68% 97% 94% 93%  Weight:      Height:        General exam: Conversant, in no acute distress Respiratory system: Barrel-shaped chest, no rales, no wheezing Cardiovascular system: regular rhythm, s1-s2 Gastrointestinal system: Nondistended, nontender, morbidly obese Central nervous system: No seizures, no tremors Extremities: No cyanosis, no joint deformities, 1+ pitting edema in bilateral lower extremities Skin: No rashes, no pallor Psychiatry: Affect normal // no auditory hallucinations     The results of significant diagnostics from this hospitalization (including imaging, microbiology, ancillary and laboratory) are listed below for reference.     Microbiology: No results found for this or any previous visit (from the past 240 hour(s)).   Labs: BNP (last 3 results) Recent Labs    09/02/19 0230  BNP 64.7   Basic Metabolic Panel: Recent Labs  Lab 09/07/19 0340 09/10/19 0417 09/11/19 1136 09/12/19 0402 09/13/19 0338  NA 138 136 136 139 139  K 4.9 5.5* 3.8 4.2 3.7  CL 97* 90* 88* 90* 92*  CO2 30 35* 38* 39* 36*  GLUCOSE 176* 221* 180* 169* 129*  BUN 23* 28* 28* 26* 26*  CREATININE 1.06* 1.14* 1.06* 1.16* 0.89  CALCIUM 9.5 8.8* 8.9 8.9 8.6*   Liver Function Tests: No results for input(s): AST, ALT, ALKPHOS, BILITOT, PROT, ALBUMIN in the last 168 hours. No results for input(s): LIPASE, AMYLASE in the last 168 hours. No results for input(s): AMMONIA in the last 168 hours. CBC: Recent Labs  Lab 09/09/19 0450 09/10/19 0417 09/11/19 0358 09/12/19 0402 09/13/19 0338  WBC 11.6* 13.6* 10.4 13.3* 14.4*  NEUTROABS 9.9* 12.0* 8.9* 11.3* 11.1*  HGB 14.0 15.2* 14.5 15.2* 14.4  HCT 45.3 48.4* 45.7 48.1* 44.4  MCV 96.6 96.2 95.2 94.5 92.7  PLT 273 283 243 252 232   Cardiac  Enzymes: No results for input(s): CKTOTAL, CKMB, CKMBINDEX, TROPONINI in the last 168 hours. BNP: Invalid input(s): POCBNP CBG: No results for input(s): GLUCAP in the last 168 hours. D-Dimer No results for input(s): DDIMER in the last 72 hours. Hgb A1c No results for input(s): HGBA1C in the last 72 hours. Lipid Profile No results for input(s): CHOL, HDL, LDLCALC, TRIG, CHOLHDL, LDLDIRECT in the last 72 hours. Thyroid function studies No results for input(s): TSH, T4TOTAL, T3FREE, THYROIDAB in the last 72 hours.  Invalid input(s): FREET3 Anemia work up No results for input(s): VITAMINB12, FOLATE, FERRITIN, TIBC, IRON, RETICCTPCT in the last 72 hours. Urinalysis No results found for: COLORURINE, APPEARANCEUR, LABSPEC, PHURINE, GLUCOSEU, HGBUR, BILIRUBINUR, KETONESUR, PROTEINUR, UROBILINOGEN, NITRITE, LEUKOCYTESUR Sepsis Labs Invalid input(s): PROCALCITONIN,  WBC,  LACTICIDVEN Microbiology No results found for this or any  previous visit (from the past 240 hour(s)).   Time coordinating discharge: Over 30 minutes  SIGNED:   Rae Halsted, MD  [facility] 09/13/2019, 2:42 PM   If 7PM-7AM, please contact night-coverage [email or web address] Password [email or web address]

## 2019-09-16 ENCOUNTER — Telehealth: Payer: Self-pay | Admitting: *Deleted

## 2019-09-16 NOTE — Care Management (Signed)
CM received message that pt discharged home with neb medication however pt does not have nebulizer in the home.  Pt discharged home on 11/13.  CM contacted pt and pt confirmed information, per pt she takes her last dose of steroid today, pt denied all resp issues and was able to speak clearly and comfortably during phone conversation.  CM contacted PCP office and spoke with triage nurse Cassie.  CM explained all of the above, Cassie assured CM that issue will be handled today and that she will schedule a virtual visit for today with pt.  CM called pt back and was told that the office had already contacted her and  scheduled an appt today at 11am.

## 2020-03-20 ENCOUNTER — Emergency Department (HOSPITAL_COMMUNITY): Payer: Medicaid Other

## 2020-03-20 ENCOUNTER — Emergency Department (HOSPITAL_COMMUNITY)
Admission: EM | Admit: 2020-03-20 | Discharge: 2020-03-20 | Disposition: A | Discharge status: Home / Self Care | Payer: Medicaid Other | Attending: Emergency Medicine | Admitting: Emergency Medicine

## 2020-03-20 ENCOUNTER — Encounter (HOSPITAL_COMMUNITY): Payer: Self-pay | Admitting: Emergency Medicine

## 2020-03-20 DIAGNOSIS — Z5321 Procedure and treatment not carried out due to patient leaving prior to being seen by health care provider: Secondary | ICD-10-CM | POA: Diagnosis not present

## 2020-03-20 DIAGNOSIS — R0602 Shortness of breath: Secondary | ICD-10-CM | POA: Insufficient documentation

## 2020-03-20 LAB — BASIC METABOLIC PANEL
Anion gap: 13 (ref 5–15)
BUN: 11 mg/dL (ref 6–20)
CO2: 24 mmol/L (ref 22–32)
Calcium: 8.8 mg/dL — ABNORMAL LOW (ref 8.9–10.3)
Chloride: 97 mmol/L — ABNORMAL LOW (ref 98–111)
Creatinine, Ser: 1.07 mg/dL — ABNORMAL HIGH (ref 0.44–1.00)
GFR calc Af Amer: >60 mL/min (ref >60)
GFR calc non Af Amer: 57 mL/min — ABNORMAL LOW (ref >60)
Glucose, Bld: 94 mg/dL (ref 70–99)
Potassium: 3.7 mmol/L (ref 3.5–5.1)
Sodium: 134 mmol/L — ABNORMAL LOW (ref 135–145)

## 2020-03-20 LAB — CBC
HCT: 44.7 % (ref 36.0–46.0)
Hemoglobin: 14.2 g/dL (ref 12.0–15.0)
MCH: 30.6 pg (ref 26.0–34.0)
MCHC: 31.8 g/dL (ref 30.0–36.0)
MCV: 96.3 fL (ref 80.0–100.0)
Platelets: 201 10*3/uL (ref 150–400)
RBC: 4.64 MIL/uL (ref 3.87–5.11)
RDW: 14.5 % (ref 11.5–15.5)
WBC: 6.3 10*3/uL (ref 4.0–10.5)
nRBC: 0 % (ref 0.0–0.2)

## 2020-03-20 LAB — TROPONIN I (HIGH SENSITIVITY)
Troponin I (High Sensitivity): 4 ng/L (ref <18)
Troponin I (High Sensitivity): 4 ng/L (ref <18)

## 2020-03-20 LAB — I-STAT BETA HCG BLOOD, ED (MC, WL, AP ONLY): I-stat hCG, quantitative: <5 m[IU]/mL (ref <5)

## 2020-03-20 MED ORDER — SODIUM CHLORIDE 0.9% FLUSH: 3.0000 mL | Freq: Once | INTRAVENOUS | Status: DC

## 2020-03-20 NOTE — ED Triage Notes (Signed)
Patient in POV, sent by PCP for further eval of SOB X few days and "low O2 levels." Sats 95% RA in triage. Also reports cough, fever/chills. Has not received COVID vaccine.

## 2020-03-27 ENCOUNTER — Other Ambulatory Visit (HOSPITAL_COMMUNITY): Payer: Self-pay | Admitting: Family

## 2020-03-28 ENCOUNTER — Emergency Department (HOSPITAL_COMMUNITY): Payer: Medicaid Other

## 2020-03-28 ENCOUNTER — Other Ambulatory Visit: Payer: Self-pay

## 2020-03-28 ENCOUNTER — Encounter (HOSPITAL_COMMUNITY): Payer: Self-pay

## 2020-03-28 ENCOUNTER — Emergency Department (HOSPITAL_COMMUNITY)
Admission: EM | Admit: 2020-03-28 | Discharge: 2020-03-28 | Disposition: A | Discharge status: Home / Self Care | Payer: Medicaid Other | Attending: Emergency Medicine | Admitting: Emergency Medicine

## 2020-03-28 DIAGNOSIS — F17210 Nicotine dependence, cigarettes, uncomplicated: Secondary | ICD-10-CM | POA: Diagnosis not present

## 2020-03-28 DIAGNOSIS — R05 Cough: Secondary | ICD-10-CM | POA: Diagnosis not present

## 2020-03-28 DIAGNOSIS — R0789 Other chest pain: Secondary | ICD-10-CM | POA: Diagnosis not present

## 2020-03-28 DIAGNOSIS — R197 Diarrhea, unspecified: Secondary | ICD-10-CM | POA: Insufficient documentation

## 2020-03-28 DIAGNOSIS — U071 COVID-19: Secondary | ICD-10-CM

## 2020-03-28 DIAGNOSIS — R110 Nausea: Secondary | ICD-10-CM | POA: Diagnosis not present

## 2020-03-28 DIAGNOSIS — Z79899 Other long term (current) drug therapy: Secondary | ICD-10-CM | POA: Diagnosis not present

## 2020-03-28 DIAGNOSIS — I10 Essential (primary) hypertension: Secondary | ICD-10-CM | POA: Insufficient documentation

## 2020-03-28 DIAGNOSIS — J449 Chronic obstructive pulmonary disease, unspecified: Secondary | ICD-10-CM | POA: Diagnosis not present

## 2020-03-28 HISTORY — DX: Chronic obstructive pulmonary disease, unspecified: J44.9

## 2020-03-28 LAB — COMPREHENSIVE METABOLIC PANEL
ALT: 13 U/L (ref 0–44)
AST: 18 U/L (ref 15–41)
Albumin: 3 g/dL — ABNORMAL LOW (ref 3.5–5.0)
Alkaline Phosphatase: 62 U/L (ref 38–126)
Anion gap: 10 (ref 5–15)
BUN: 8 mg/dL (ref 6–20)
CO2: 24 mmol/L (ref 22–32)
Calcium: 8.9 mg/dL (ref 8.9–10.3)
Chloride: 103 mmol/L (ref 98–111)
Creatinine, Ser: 1.07 mg/dL — ABNORMAL HIGH (ref 0.44–1.00)
GFR calc Af Amer: >60 mL/min (ref >60)
GFR calc non Af Amer: 57 mL/min — ABNORMAL LOW (ref >60)
Glucose, Bld: 102 mg/dL — ABNORMAL HIGH (ref 70–99)
Potassium: 3.1 mmol/L — ABNORMAL LOW (ref 3.5–5.1)
Sodium: 137 mmol/L (ref 135–145)
Total Bilirubin: 0.8 mg/dL (ref 0.3–1.2)
Total Protein: 7.5 g/dL (ref 6.5–8.1)

## 2020-03-28 LAB — CBC WITH DIFFERENTIAL/PLATELET
Abs Immature Granulocytes: 0.06 10*3/uL (ref 0.00–0.07)
Basophils Absolute: 0.1 10*3/uL (ref 0.0–0.1)
Basophils Relative: 1 %
Eosinophils Absolute: 0.2 10*3/uL (ref 0.0–0.5)
Eosinophils Relative: 2 %
HCT: 42.4 % (ref 36.0–46.0)
Hemoglobin: 13.7 g/dL (ref 12.0–15.0)
Immature Granulocytes: 1 %
Lymphocytes Relative: 29 %
Lymphs Abs: 2.8 10*3/uL (ref 0.7–4.0)
MCH: 30.6 pg (ref 26.0–34.0)
MCHC: 32.3 g/dL (ref 30.0–36.0)
MCV: 94.9 fL (ref 80.0–100.0)
Monocytes Absolute: 0.9 10*3/uL (ref 0.1–1.0)
Monocytes Relative: 10 %
Neutro Abs: 5.5 10*3/uL (ref 1.7–7.7)
Neutrophils Relative %: 57 %
Platelets: 316 10*3/uL (ref 150–400)
RBC: 4.47 MIL/uL (ref 3.87–5.11)
RDW: 13.8 % (ref 11.5–15.5)
WBC: 9.5 10*3/uL (ref 4.0–10.5)
nRBC: 0 % (ref 0.0–0.2)

## 2020-03-28 LAB — TROPONIN I (HIGH SENSITIVITY)
Troponin I (High Sensitivity): <2 ng/L (ref <18)
Troponin I (High Sensitivity): <2 ng/L (ref <18)

## 2020-03-28 LAB — MAGNESIUM: Magnesium: 2.1 mg/dL (ref 1.7–2.4)

## 2020-03-28 MED ORDER — POTASSIUM CHLORIDE CRYS ER 20 MEQ PO TBCR
40.0000 meq | EXTENDED_RELEASE_TABLET | Freq: Once | ORAL | Status: AC
  Administered 2020-03-28: 40 meq via ORAL
  Filled 2020-03-28: qty 2

## 2020-03-28 MED ORDER — SODIUM CHLORIDE 0.9 % IV BOLUS
500.0000 mL | Freq: Once | INTRAVENOUS | Status: AC
  Administered 2020-03-28: 500 mL via INTRAVENOUS

## 2020-03-28 MED ORDER — ALBUTEROL SULFATE HFA 108 (90 BASE) MCG/ACT IN AERS
2.0000 | INHALATION_SPRAY | Freq: Once | RESPIRATORY_TRACT | Status: AC
  Administered 2020-03-28: 2 via RESPIRATORY_TRACT
  Filled 2020-03-28: qty 6.7

## 2020-03-28 NOTE — ED Triage Notes (Signed)
Pt BIB [facility] from home w/ c/o CP, SOB, dx COVID over 1 wk ago, experiencing abd sx, cough, SOB, Hx COPD. Pt had CP today, per EMS, pt had run of Vtach, unresponsive, responsive to sternal rub, pt converted on her own. EKG unremarkable otherwise. VS stable, pt on 2 L [place] @ baseline. Denies CP at this time.

## 2020-03-28 NOTE — ED Notes (Addendum)
Pts O2 sat remained above 98% will ambulating in room. 2L via [place] used while ambulating. Pt complained of SOB.

## 2020-03-28 NOTE — ED Provider Notes (Signed)
[facility] EMERGENCY DEPARTMENT Provider Note   CSN: [id number] Arrival date & time: 03/28/20  1538     History Chief Complaint  Patient presents with  . Chest Pain  . Covid Exposure    Kim [name] is a 59 y.o. female.  HPI   59 year old female with a history of COPD (on chronic 2 L), hyperlipidemia, hypertension, OSA, who presents to the emergency department for evaluation of Covid symptoms.  Patient states she was diagnosed with Covid about 1 week ago.  She had been symptomatic for about 5 days prior to this.  She has had an intermittent cough, shortness of breath, fatigue, nausea, diarrhea, abdominal cramping.  She also had an episode of chest pain about 2 hours prior to arrival which is since resolved.  States it felt like a pressure in the center of her chest.  States she has had decreased p.o. intake and has not been able to eat anything in the last 5 to 6 days.  Past Medical History:  Diagnosis Date  . COPD (chronic obstructive pulmonary disease) ([place])   . High cholesterol   . Hypertension     Patient Active Problem List   Diagnosis Date Noted  . OSA (obstructive sleep apnea)   . Acute pulmonary edema (HCC)   . Acute respiratory failure with hypoxia ([place]) 09/02/2019  . COPD exacerbation ([place]) 09/02/2019  . Essential hypertension 09/02/2019  . HLD (hyperlipidemia) 09/02/2019    History reviewed. No pertinent surgical history.   OB History   No obstetric history on file.     Family History  Problem Relation Age of Onset  . CAD Mother     Social History   Tobacco Use  . Smoking status: Current Every Day Smoker    Packs/day: 1.00    Types: Cigarettes  . Smokeless tobacco: Never Used  Substance Use Topics  . Alcohol use: Not Currently  . Drug use: Not Currently    Home Medications Prior to Admission medications   Medication Sig Start Date End Date Taking? Authorizing Provider  acetaminophen (TYLENOL) 500 MG tablet Take 500 mg  by mouth every 6 (six) hours as needed for headache (pain).    [provider]  amLODipine (NORVASC) 5 MG tablet Take 1 tablet (5 mg total) by mouth daily. 06/06/17   Daleen Bo, MD  budesonide (PULMICORT) 0.5 MG/2ML nebulizer solution Take 2 mLs (0.5 mg total) by nebulization 2 (two) times daily. 09/13/19   Charolotte Capuchin, MD  nicotine (NICODERM CQ - DOSED IN MG/24 HOURS) 14 mg/24hr patch Place 1 patch (14 mg total) onto the skin daily. 09/14/19   Charolotte Capuchin, MD  pravastatin (PRAVACHOL) 20 MG tablet Take 20 mg by mouth at bedtime.  07/02/18   [provider]    Allergies    Patient has no known allergies.  Review of Systems   Review of Systems  Constitutional: Positive for fatigue. Negative for chills and fever.  HENT: Negative for ear pain and sore throat.   Eyes: Negative for visual disturbance.  Respiratory: Positive for cough. Negative for shortness of breath.   Cardiovascular: Positive for chest pain.  Gastrointestinal: Positive for abdominal pain, diarrhea and nausea. Negative for vomiting.  Genitourinary: Negative for dysuria and hematuria.  Musculoskeletal: Negative for back pain.  Skin: Negative for rash.  Neurological: Negative for headaches.  All other systems reviewed and are negative.   Physical Exam Updated Vital Signs BP 136/68   Pulse 65   Temp 98.2 F (  36.8 C) (Oral)   Resp (!) 21   SpO2 97%   Physical Exam Vitals and nursing note reviewed.  Constitutional:      General: She is not in acute distress.    Appearance: She is well-developed.  HENT:     Head: Normocephalic and atraumatic.  Eyes:     Conjunctiva/sclera: Conjunctivae normal.  Cardiovascular:     Rate and Rhythm: Normal rate and regular rhythm.     Heart sounds: No murmur.  Pulmonary:     Effort: Pulmonary effort is normal. No respiratory distress.     Breath sounds: Examination of the right-middle field reveals rales. Examination of the right-lower field reveals  rales. Rales present.  Abdominal:     General: Bowel sounds are normal.     Palpations: Abdomen is soft.     Tenderness: There is no abdominal tenderness. There is no guarding or rebound.  Musculoskeletal:     Cervical back: Neck supple.     Right lower leg: No tenderness. No edema.     Left lower leg: No tenderness. No edema.  Skin:    General: Skin is warm and dry.  Neurological:     Mental Status: She is alert.     ED Results / Procedures / Treatments   Labs (all labs ordered are listed, but only abnormal results are displayed) Labs Reviewed  COMPREHENSIVE METABOLIC PANEL - Abnormal; Notable for the following components:      Result Value   Potassium 3.1 (*)    Glucose, Bld 102 (*)    Creatinine, Ser 1.07 (*)    Albumin 3.0 (*)    GFR calc non Af Amer 57 (*)    All other components within normal limits  CBC WITH DIFFERENTIAL/PLATELET  MAGNESIUM  TROPONIN I (HIGH SENSITIVITY)  TROPONIN I (HIGH SENSITIVITY)    EKG EKG Interpretation  Date/Time:  Saturday Mar 28 2020 15:40:51 EDT Ventricular Rate:  81 PR Interval:    QRS Duration: 89 QT Interval:  377 QTC Calculation: 438 R Axis:   109 Text Interpretation: Sinus rhythm Right axis deviation No significant change since last tracing Confirmed by Linwood Dibbles [phone]) on 03/28/2020 4:02:40 PM        Radiology DG Chest Portable 1 View  Result Date: 03/28/2020 CLINICAL DATA:  59 year old female with chest pain and shortness of breath. Diagnosed with COVID 1 week ago. EXAM: PORTABLE CHEST 1 VIEW COMPARISON:  03/20/2020 and prior radiographs FINDINGS: The cardiomediastinal silhouette is unchanged. There is no evidence of focal airspace disease, pulmonary edema, suspicious pulmonary nodule/mass, pleural effusion, or pneumothorax. No acute bony abnormalities are identified. IMPRESSION: No evidence of acute cardiopulmonary disease. Electronically Signed   By: Harmon Pier M.D.   On: 03/28/2020 16:20    Procedures Procedures  (including critical care time)  9:14 PM Cardiac monitoring reveals NSR, HR83 (Rate & rhythm), as reviewed and interpreted by me. Cardiac monitoring was ordered due to concern for arrythmia, cp, sob and to monitor patient for dysrhythmia.   Medications Ordered in ED Medications  sodium chloride 0.9 % bolus 500 mL (0 mLs Intravenous Stopped 03/28/20 1903)  albuterol (VENTOLIN HFA) 108 (90 Base) MCG/ACT inhaler 2 puff (2 puffs Inhalation Given 03/28/20 1621)  potassium chloride SA (KLOR-CON) CR tablet 40 mEq (40 mEq Oral Given 03/28/20 1737)    ED Course  I have reviewed the triage vital signs and the nursing notes.  Pertinent labs & imaging results that were available during my care of the patient were  reviewed by me and considered in my medical decision making (see chart for details).    MDM Rules/Calculators/A&P                      59 y/o F that was recently dx with COVID presenting for eval of sob, fatigue, and an episode of chest pain.   Per ems, pt had a run of Vtach with them. Reviewed rhythm strip and there is evidence of wide complex tachycardia in some leads but in others she is in NSR. Picture is attached above.   On arrival to the ED her VS are wnl. NSR on monitor. Pt ambulated in the ed and sats remained at 98% on her chronic 2L   Reviewed/interpreted labs CBC w/o leukocytosis or anemia CMP with mild hypokalemia, otherwise reassuring Trop neg  EKG with NSR, Right axis deviation, No significant change since last tracing   CXR with reviewed/interpreted - no acute cardiopulm disease  CONSULT with Dr. Deforest Hoyles with cardiology who reviewed rhythm strip and agrees this is consistent with any concerning arrhythmia.   Reassessed patient.  She feels somewhat improved after albuterol.  We discussed work-up thus far.  We discussed possibility of PE being increased with diagnosis of coronavirus.  Discussed pros/cons and CT imaging and I recommended we obtain a CTA of the chest to rule  out PE.  Patient declined at this time.  Her work-up is otherwise reassuring.  She is not having any hypoxia at rest or with ambulation and is on her home oxygen requirement.  Feel she is appropriate for discharge home with continued treatment of her COPD and supportive measures for Covid.  Have advised her to return to the ED for any new or worsening symptoms.  She voiced understanding of plan and reasons to return.  All questions answered.  Patient stable for discharge.   Kim [name] was evaluated in Emergency Department on 03/28/2020 for the symptoms described in the history of present illness. She was evaluated in the context of the global COVID-19 pandemic, which necessitated consideration that the patient might be at risk for infection with the SARS-CoV-2 virus that causes COVID-19. Institutional protocols and algorithms that pertain to the evaluation of patients at risk for COVID-19 are in a state of rapid change based on information released by regulatory bodies including the CDC and federal and state organizations. These policies and algorithms were followed during the patient's care in the ED.   Final Clinical Impression(s) / ED Diagnoses Final diagnoses:  COVID-19    Rx / DC Orders ED Discharge Orders    None       Rayne Du 03/28/20 2114    Linwood Dibbles, MD 03/29/20 1501

## 2020-03-28 NOTE — Discharge Instructions (Addendum)
Take 2 puffs of the albuterol inhaler as needed for shortness of breath.  You may take Tylenol and Motrin as needed for body aches and fevers.  Please follow up with your primary care provider within 3-5 days for re-evaluation of your symptoms. If you do not have a primary care provider, information for a healthcare clinic has been provided for you to make arrangements for follow up care. Please return to the emergency department for any new or worsening symptoms.

## 2020-04-30 DIAGNOSIS — Z419 Encounter for procedure for purposes other than remedying health state, unspecified: Secondary | ICD-10-CM | POA: Diagnosis not present

## 2020-04-30 DIAGNOSIS — K5909 Other constipation: Secondary | ICD-10-CM | POA: Diagnosis not present

## 2020-04-30 DIAGNOSIS — Z1211 Encounter for screening for malignant neoplasm of colon: Secondary | ICD-10-CM | POA: Diagnosis not present

## 2020-04-30 DIAGNOSIS — R109 Unspecified abdominal pain: Secondary | ICD-10-CM | POA: Diagnosis not present

## 2020-04-30 DIAGNOSIS — B86 Scabies: Secondary | ICD-10-CM | POA: Diagnosis not present

## 2020-05-31 DIAGNOSIS — Z419 Encounter for procedure for purposes other than remedying health state, unspecified: Secondary | ICD-10-CM | POA: Diagnosis not present

## 2020-06-06 IMAGING — DX DG CHEST 1V PORT
1 series · 1 of 1 positions shown · non-contrast
Comparison: 03/20/2020 and prior radiographs

CLINICAL DATA: 58-year-old female with chest pain and shortness of
breath. Diagnosed with COVID 1 week ago.

EXAM:
PORTABLE CHEST 1 VIEW

[chest]
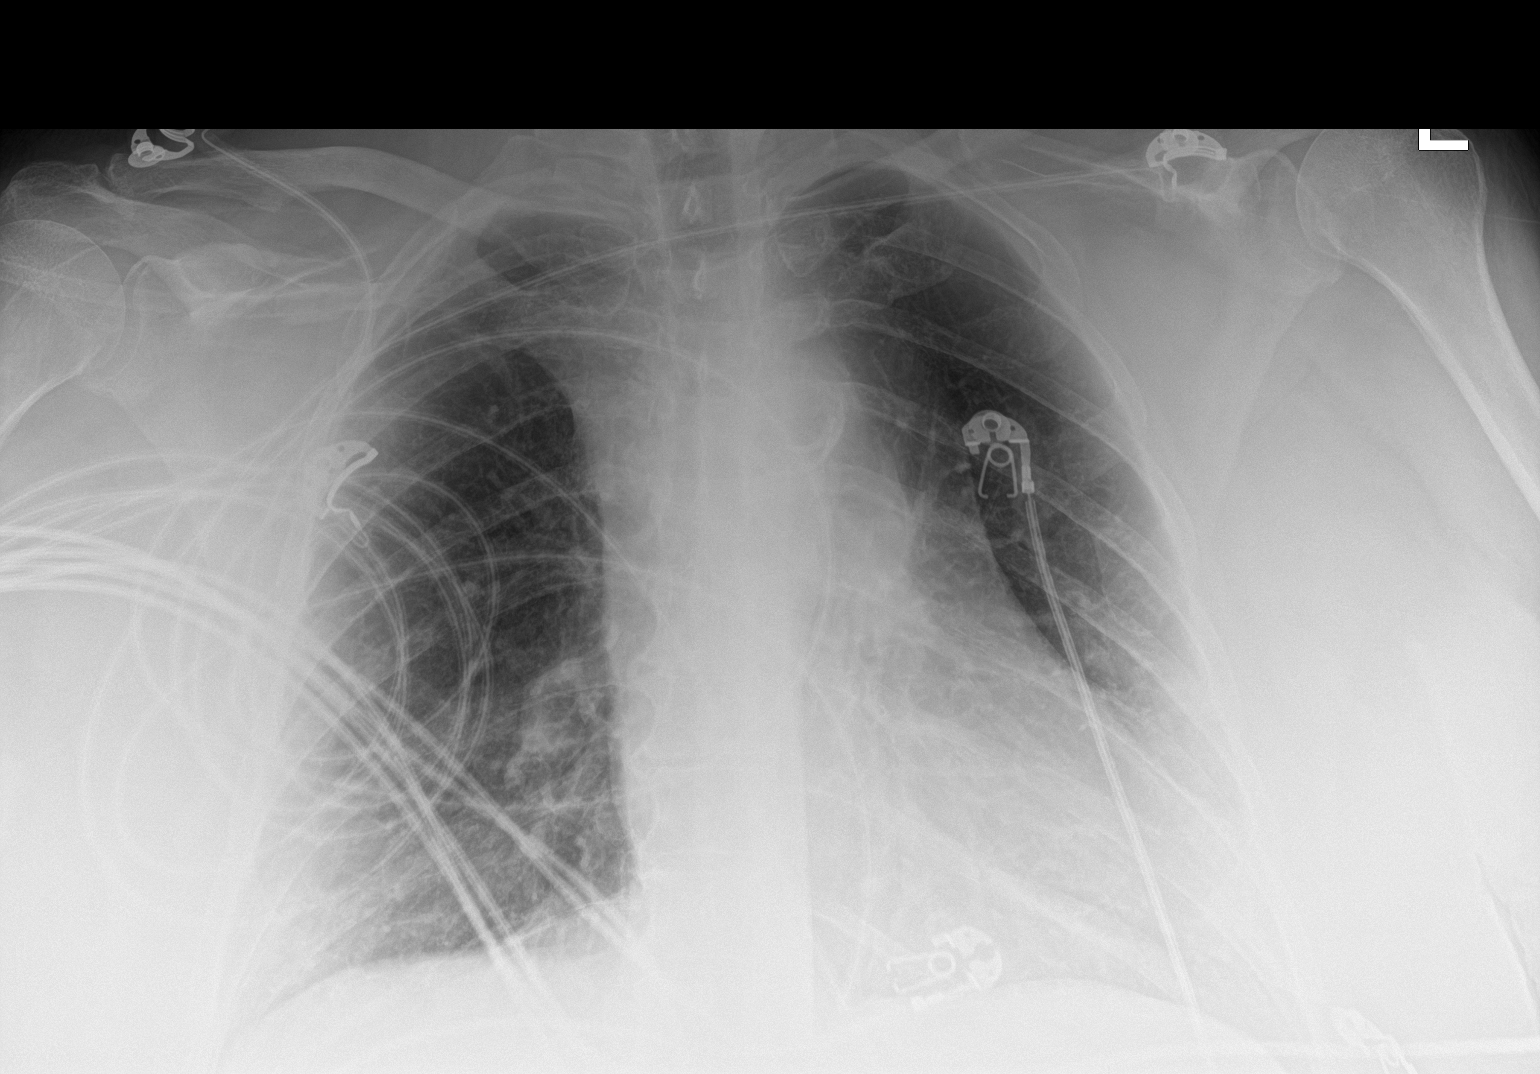

[1 of 1 positions shown; findings below may reference images not displayed]

FINDINGS: The cardiomediastinal silhouette is unchanged.

There is no evidence of focal airspace disease, pulmonary edema,
suspicious pulmonary nodule/mass, pleural effusion, or pneumothorax.

No acute bony abnormalities are identified.
IMPRESSION: No evidence of acute cardiopulmonary disease.

## 2020-07-01 DIAGNOSIS — Z419 Encounter for procedure for purposes other than remedying health state, unspecified: Secondary | ICD-10-CM | POA: Diagnosis not present

## 2020-07-09 DIAGNOSIS — J449 Chronic obstructive pulmonary disease, unspecified: Secondary | ICD-10-CM | POA: Diagnosis not present

## 2020-07-31 DIAGNOSIS — Z419 Encounter for procedure for purposes other than remedying health state, unspecified: Secondary | ICD-10-CM | POA: Diagnosis not present

## 2020-08-31 DIAGNOSIS — Z419 Encounter for procedure for purposes other than remedying health state, unspecified: Secondary | ICD-10-CM | POA: Diagnosis not present

## 2020-09-30 DIAGNOSIS — Z419 Encounter for procedure for purposes other than remedying health state, unspecified: Secondary | ICD-10-CM | POA: Diagnosis not present

## 2020-10-20 DIAGNOSIS — R202 Paresthesia of skin: Secondary | ICD-10-CM | POA: Diagnosis not present

## 2020-10-20 DIAGNOSIS — R200 Anesthesia of skin: Secondary | ICD-10-CM | POA: Diagnosis not present

## 2020-10-20 DIAGNOSIS — Z2089 Contact with and (suspected) exposure to other communicable diseases: Secondary | ICD-10-CM | POA: Diagnosis not present

## 2020-10-29 DIAGNOSIS — R202 Paresthesia of skin: Secondary | ICD-10-CM | POA: Diagnosis not present

## 2020-10-29 DIAGNOSIS — R7303 Prediabetes: Secondary | ICD-10-CM | POA: Diagnosis not present

## 2020-10-29 DIAGNOSIS — E785 Hyperlipidemia, unspecified: Secondary | ICD-10-CM | POA: Diagnosis not present

## 2020-10-29 DIAGNOSIS — E559 Vitamin D deficiency, unspecified: Secondary | ICD-10-CM | POA: Diagnosis not present

## 2020-10-29 DIAGNOSIS — R200 Anesthesia of skin: Secondary | ICD-10-CM | POA: Diagnosis not present

## 2020-10-29 DIAGNOSIS — F419 Anxiety disorder, unspecified: Secondary | ICD-10-CM | POA: Diagnosis not present

## 2020-10-29 DIAGNOSIS — Z1152 Encounter for screening for COVID-19: Secondary | ICD-10-CM | POA: Diagnosis not present

## 2020-10-31 DIAGNOSIS — Z419 Encounter for procedure for purposes other than remedying health state, unspecified: Secondary | ICD-10-CM | POA: Diagnosis not present

## 2020-12-01 DIAGNOSIS — Z419 Encounter for procedure for purposes other than remedying health state, unspecified: Secondary | ICD-10-CM | POA: Diagnosis not present

## 2020-12-29 DIAGNOSIS — Z419 Encounter for procedure for purposes other than remedying health state, unspecified: Secondary | ICD-10-CM | POA: Diagnosis not present

## 2021-01-28 DIAGNOSIS — I10 Essential (primary) hypertension: Secondary | ICD-10-CM | POA: Diagnosis not present

## 2021-01-28 DIAGNOSIS — R7303 Prediabetes: Secondary | ICD-10-CM | POA: Diagnosis not present

## 2021-01-28 DIAGNOSIS — F419 Anxiety disorder, unspecified: Secondary | ICD-10-CM | POA: Diagnosis not present

## 2021-01-28 DIAGNOSIS — E559 Vitamin D deficiency, unspecified: Secondary | ICD-10-CM | POA: Diagnosis not present

## 2021-01-28 DIAGNOSIS — R5381 Other malaise: Secondary | ICD-10-CM | POA: Diagnosis not present

## 2021-01-29 DIAGNOSIS — Z419 Encounter for procedure for purposes other than remedying health state, unspecified: Secondary | ICD-10-CM | POA: Diagnosis not present

## 2021-02-28 DIAGNOSIS — Z419 Encounter for procedure for purposes other than remedying health state, unspecified: Secondary | ICD-10-CM | POA: Diagnosis not present

## 2021-03-08 DIAGNOSIS — J449 Chronic obstructive pulmonary disease, unspecified: Secondary | ICD-10-CM | POA: Diagnosis not present

## 2021-03-31 DIAGNOSIS — Z419 Encounter for procedure for purposes other than remedying health state, unspecified: Secondary | ICD-10-CM | POA: Diagnosis not present

## 2021-04-08 DIAGNOSIS — J449 Chronic obstructive pulmonary disease, unspecified: Secondary | ICD-10-CM | POA: Diagnosis not present

## 2021-04-30 DIAGNOSIS — Z419 Encounter for procedure for purposes other than remedying health state, unspecified: Secondary | ICD-10-CM | POA: Diagnosis not present

## 2021-05-08 DIAGNOSIS — J449 Chronic obstructive pulmonary disease, unspecified: Secondary | ICD-10-CM | POA: Diagnosis not present

## 2021-05-31 DIAGNOSIS — Z419 Encounter for procedure for purposes other than remedying health state, unspecified: Secondary | ICD-10-CM | POA: Diagnosis not present

## 2021-06-08 DIAGNOSIS — J449 Chronic obstructive pulmonary disease, unspecified: Secondary | ICD-10-CM | POA: Diagnosis not present

## 2021-07-01 DIAGNOSIS — Z419 Encounter for procedure for purposes other than remedying health state, unspecified: Secondary | ICD-10-CM | POA: Diagnosis not present

## 2021-07-09 DIAGNOSIS — J449 Chronic obstructive pulmonary disease, unspecified: Secondary | ICD-10-CM | POA: Diagnosis not present

## 2021-07-31 DIAGNOSIS — Z419 Encounter for procedure for purposes other than remedying health state, unspecified: Secondary | ICD-10-CM | POA: Diagnosis not present

## 2021-08-08 DIAGNOSIS — J449 Chronic obstructive pulmonary disease, unspecified: Secondary | ICD-10-CM | POA: Diagnosis not present

## 2021-08-31 DIAGNOSIS — Z419 Encounter for procedure for purposes other than remedying health state, unspecified: Secondary | ICD-10-CM | POA: Diagnosis not present

## 2021-09-30 DIAGNOSIS — Z419 Encounter for procedure for purposes other than remedying health state, unspecified: Secondary | ICD-10-CM | POA: Diagnosis not present

## 2021-10-31 DIAGNOSIS — Z419 Encounter for procedure for purposes other than remedying health state, unspecified: Secondary | ICD-10-CM | POA: Diagnosis not present

## 2021-12-01 DIAGNOSIS — Z419 Encounter for procedure for purposes other than remedying health state, unspecified: Secondary | ICD-10-CM | POA: Diagnosis not present

## 2021-12-29 DIAGNOSIS — Z419 Encounter for procedure for purposes other than remedying health state, unspecified: Secondary | ICD-10-CM | POA: Diagnosis not present

## 2022-01-29 DIAGNOSIS — Z419 Encounter for procedure for purposes other than remedying health state, unspecified: Secondary | ICD-10-CM | POA: Diagnosis not present

## 2022-02-28 DIAGNOSIS — Z419 Encounter for procedure for purposes other than remedying health state, unspecified: Secondary | ICD-10-CM | POA: Diagnosis not present

## 2022-03-31 DIAGNOSIS — Z419 Encounter for procedure for purposes other than remedying health state, unspecified: Secondary | ICD-10-CM | POA: Diagnosis not present

## 2022-04-30 DIAGNOSIS — Z419 Encounter for procedure for purposes other than remedying health state, unspecified: Secondary | ICD-10-CM | POA: Diagnosis not present

## 2022-05-02 ENCOUNTER — Emergency Department (HOSPITAL_COMMUNITY): Payer: Medicaid Other

## 2022-05-02 ENCOUNTER — Other Ambulatory Visit: Payer: Self-pay

## 2022-05-02 ENCOUNTER — Encounter (HOSPITAL_COMMUNITY): Payer: Self-pay | Admitting: Internal Medicine

## 2022-05-02 ENCOUNTER — Inpatient Hospital Stay (HOSPITAL_COMMUNITY)
Admission: EM | Admit: 2022-05-02 | Discharge: 2022-05-09 | DRG: 871 | Disposition: A | Discharge status: Home Health | Payer: Medicaid Other | Attending: Internal Medicine | Admitting: Internal Medicine

## 2022-05-02 DIAGNOSIS — Z1612 Extended spectrum beta lactamase (ESBL) resistance: Secondary | ICD-10-CM | POA: Diagnosis not present

## 2022-05-02 DIAGNOSIS — R778 Other specified abnormalities of plasma proteins: Secondary | ICD-10-CM | POA: Diagnosis present

## 2022-05-02 DIAGNOSIS — Z8249 Family history of ischemic heart disease and other diseases of the circulatory system: Secondary | ICD-10-CM

## 2022-05-02 DIAGNOSIS — J441 Chronic obstructive pulmonary disease with (acute) exacerbation: Secondary | ICD-10-CM | POA: Diagnosis present

## 2022-05-02 DIAGNOSIS — N1831 Chronic kidney disease, stage 3a: Secondary | ICD-10-CM | POA: Diagnosis not present

## 2022-05-02 DIAGNOSIS — Z6839 Body mass index (BMI) 39.0-39.9, adult: Secondary | ICD-10-CM | POA: Diagnosis not present

## 2022-05-02 DIAGNOSIS — R7881 Bacteremia: Secondary | ICD-10-CM

## 2022-05-02 DIAGNOSIS — N183 Chronic kidney disease, stage 3 unspecified: Secondary | ICD-10-CM

## 2022-05-02 DIAGNOSIS — A4151 Sepsis due to Escherichia coli [E. coli]: Secondary | ICD-10-CM | POA: Diagnosis not present

## 2022-05-02 DIAGNOSIS — K5900 Constipation, unspecified: Secondary | ICD-10-CM | POA: Diagnosis not present

## 2022-05-02 DIAGNOSIS — Z0389 Encounter for observation for other suspected diseases and conditions ruled out: Secondary | ICD-10-CM | POA: Diagnosis not present

## 2022-05-02 DIAGNOSIS — R739 Hyperglycemia, unspecified: Secondary | ICD-10-CM | POA: Diagnosis present

## 2022-05-02 DIAGNOSIS — G4733 Obstructive sleep apnea (adult) (pediatric): Secondary | ICD-10-CM

## 2022-05-02 DIAGNOSIS — J9601 Acute respiratory failure with hypoxia: Secondary | ICD-10-CM

## 2022-05-02 DIAGNOSIS — A419 Sepsis, unspecified organism: Secondary | ICD-10-CM | POA: Diagnosis not present

## 2022-05-02 DIAGNOSIS — I7143 Infrarenal abdominal aortic aneurysm, without rupture: Secondary | ICD-10-CM | POA: Diagnosis present

## 2022-05-02 DIAGNOSIS — R000 Tachycardia, unspecified: Secondary | ICD-10-CM | POA: Diagnosis not present

## 2022-05-02 DIAGNOSIS — R0602 Shortness of breath: Secondary | ICD-10-CM | POA: Diagnosis not present

## 2022-05-02 DIAGNOSIS — R079 Chest pain, unspecified: Secondary | ICD-10-CM | POA: Diagnosis not present

## 2022-05-02 DIAGNOSIS — I129 Hypertensive chronic kidney disease with stage 1 through stage 4 chronic kidney disease, or unspecified chronic kidney disease: Secondary | ICD-10-CM | POA: Diagnosis present

## 2022-05-02 DIAGNOSIS — D72829 Elevated white blood cell count, unspecified: Secondary | ICD-10-CM | POA: Diagnosis not present

## 2022-05-02 DIAGNOSIS — J9622 Acute and chronic respiratory failure with hypercapnia: Secondary | ICD-10-CM | POA: Diagnosis not present

## 2022-05-02 DIAGNOSIS — E7800 Pure hypercholesterolemia, unspecified: Secondary | ICD-10-CM | POA: Diagnosis present

## 2022-05-02 DIAGNOSIS — E66813 Obesity, class 3: Secondary | ICD-10-CM

## 2022-05-02 DIAGNOSIS — R0902 Hypoxemia: Secondary | ICD-10-CM | POA: Diagnosis not present

## 2022-05-02 DIAGNOSIS — E662 Morbid (severe) obesity with alveolar hypoventilation: Secondary | ICD-10-CM | POA: Diagnosis not present

## 2022-05-02 DIAGNOSIS — J189 Pneumonia, unspecified organism: Secondary | ICD-10-CM | POA: Diagnosis not present

## 2022-05-02 DIAGNOSIS — Z79899 Other long term (current) drug therapy: Secondary | ICD-10-CM | POA: Diagnosis not present

## 2022-05-02 DIAGNOSIS — R6520 Severe sepsis without septic shock: Secondary | ICD-10-CM | POA: Diagnosis not present

## 2022-05-02 DIAGNOSIS — J9621 Acute and chronic respiratory failure with hypoxia: Secondary | ICD-10-CM | POA: Diagnosis present

## 2022-05-02 DIAGNOSIS — Z20822 Contact with and (suspected) exposure to covid-19: Secondary | ICD-10-CM | POA: Diagnosis not present

## 2022-05-02 DIAGNOSIS — Z7951 Long term (current) use of inhaled steroids: Secondary | ICD-10-CM

## 2022-05-02 DIAGNOSIS — Z743 Need for continuous supervision: Secondary | ICD-10-CM | POA: Diagnosis not present

## 2022-05-02 DIAGNOSIS — F17210 Nicotine dependence, cigarettes, uncomplicated: Secondary | ICD-10-CM | POA: Diagnosis not present

## 2022-05-02 DIAGNOSIS — I10 Essential (primary) hypertension: Secondary | ICD-10-CM | POA: Diagnosis present

## 2022-05-02 DIAGNOSIS — B9629 Other Escherichia coli [E. coli] as the cause of diseases classified elsewhere: Secondary | ICD-10-CM | POA: Diagnosis not present

## 2022-05-02 DIAGNOSIS — J440 Chronic obstructive pulmonary disease with acute lower respiratory infection: Secondary | ICD-10-CM | POA: Diagnosis present

## 2022-05-02 DIAGNOSIS — D751 Secondary polycythemia: Secondary | ICD-10-CM | POA: Diagnosis present

## 2022-05-02 DIAGNOSIS — J449 Chronic obstructive pulmonary disease, unspecified: Secondary | ICD-10-CM | POA: Diagnosis present

## 2022-05-02 DIAGNOSIS — Z9981 Dependence on supplemental oxygen: Secondary | ICD-10-CM | POA: Diagnosis not present

## 2022-05-02 DIAGNOSIS — B9620 Unspecified Escherichia coli [E. coli] as the cause of diseases classified elsewhere: Secondary | ICD-10-CM | POA: Diagnosis not present

## 2022-05-02 DIAGNOSIS — K5730 Diverticulosis of large intestine without perforation or abscess without bleeding: Secondary | ICD-10-CM | POA: Diagnosis not present

## 2022-05-02 DIAGNOSIS — R531 Weakness: Secondary | ICD-10-CM | POA: Diagnosis not present

## 2022-05-02 HISTORY — DX: Nicotine dependence, unspecified, uncomplicated: F17.200

## 2022-05-02 LAB — CBC WITH DIFFERENTIAL/PLATELET
Abs Immature Granulocytes: 0.14 10*3/uL — ABNORMAL HIGH (ref 0.00–0.07)
Basophils Absolute: 0.1 10*3/uL (ref 0.0–0.1)
Basophils Relative: 0 %
Eosinophils Absolute: 0.1 10*3/uL (ref 0.0–0.5)
Eosinophils Relative: 0 %
HCT: 52 % — ABNORMAL HIGH (ref 36.0–46.0)
Hemoglobin: 16 g/dL — ABNORMAL HIGH (ref 12.0–15.0)
Immature Granulocytes: 1 %
Lymphocytes Relative: 4 %
Lymphs Abs: 0.7 10*3/uL (ref 0.7–4.0)
MCH: 27.4 pg (ref 26.0–34.0)
MCHC: 30.8 g/dL (ref 30.0–36.0)
MCV: 88.9 fL (ref 80.0–100.0)
Monocytes Absolute: 0.5 10*3/uL (ref 0.1–1.0)
Monocytes Relative: 2 %
Neutro Abs: 18 10*3/uL — ABNORMAL HIGH (ref 1.7–7.7)
Neutrophils Relative %: 93 %
Platelets: 238 10*3/uL (ref 150–400)
RBC: 5.85 MIL/uL — ABNORMAL HIGH (ref 3.87–5.11)
RDW: 19.6 % — ABNORMAL HIGH (ref 11.5–15.5)
WBC: 19.4 10*3/uL — ABNORMAL HIGH (ref 4.0–10.5)
nRBC: 0 % (ref 0.0–0.2)

## 2022-05-02 LAB — URINALYSIS, ROUTINE W REFLEX MICROSCOPIC
Bacteria, UA: NONE SEEN
Bilirubin Urine: NEGATIVE
Glucose, UA: NEGATIVE mg/dL
Hgb urine dipstick: NEGATIVE
Ketones, ur: NEGATIVE mg/dL
Leukocytes,Ua: NEGATIVE
Nitrite: NEGATIVE
Protein, ur: 100 mg/dL — AB
Specific Gravity, Urine: 1.016 (ref 1.005–1.030)
pH: 7 (ref 5.0–8.0)

## 2022-05-02 LAB — COMPREHENSIVE METABOLIC PANEL
ALT: 15 U/L (ref 0–44)
AST: 27 U/L (ref 15–41)
Albumin: 3 g/dL — ABNORMAL LOW (ref 3.5–5.0)
Alkaline Phosphatase: 102 U/L (ref 38–126)
Anion gap: 12 (ref 5–15)
BUN: 17 mg/dL (ref 8–23)
CO2: 26 mmol/L (ref 22–32)
Calcium: 8.9 mg/dL (ref 8.9–10.3)
Chloride: 99 mmol/L (ref 98–111)
Creatinine, Ser: 1.26 mg/dL — ABNORMAL HIGH (ref 0.44–1.00)
GFR, Estimated: 49 mL/min — ABNORMAL LOW (ref >60)
Glucose, Bld: 112 mg/dL — ABNORMAL HIGH (ref 70–99)
Potassium: 4.8 mmol/L (ref 3.5–5.1)
Sodium: 137 mmol/L (ref 135–145)
Total Bilirubin: 1 mg/dL (ref 0.3–1.2)
Total Protein: 7.2 g/dL (ref 6.5–8.1)

## 2022-05-02 LAB — I-STAT CHEM 8, ED
BUN: 24 mg/dL — ABNORMAL HIGH (ref 8–23)
Calcium, Ion: 1 mmol/L — ABNORMAL LOW (ref 1.15–1.40)
Chloride: 101 mmol/L (ref 98–111)
Creatinine, Ser: 1.1 mg/dL — ABNORMAL HIGH (ref 0.44–1.00)
Glucose, Bld: 110 mg/dL — ABNORMAL HIGH (ref 70–99)
HCT: 55 % — ABNORMAL HIGH (ref 36.0–46.0)
Hemoglobin: 18.7 g/dL — ABNORMAL HIGH (ref 12.0–15.0)
Potassium: 6 mmol/L — ABNORMAL HIGH (ref 3.5–5.1)
Sodium: 135 mmol/L (ref 135–145)
TCO2: 28 mmol/L (ref 22–32)

## 2022-05-02 LAB — BRAIN NATRIURETIC PEPTIDE: B Natriuretic Peptide: 155.4 pg/mL — ABNORMAL HIGH (ref 0.0–100.0)

## 2022-05-02 LAB — APTT: aPTT: 30 seconds (ref 24–36)

## 2022-05-02 LAB — RESP PANEL BY RT-PCR (FLU A&B, COVID) ARPGX2
Influenza A by PCR: NEGATIVE
Influenza B by PCR: NEGATIVE
SARS Coronavirus 2 by RT PCR: NEGATIVE

## 2022-05-02 LAB — TROPONIN I (HIGH SENSITIVITY)
Troponin I (High Sensitivity): 45 ng/L — ABNORMAL HIGH (ref <18)
Troponin I (High Sensitivity): 74 ng/L — ABNORMAL HIGH (ref <18)
Troponin I (High Sensitivity): 130 ng/L (ref <18)
Troponin I (High Sensitivity): 92 ng/L — ABNORMAL HIGH (ref <18)

## 2022-05-02 LAB — BASIC METABOLIC PANEL WITH GFR
Anion gap: 10 (ref 5–15)
BUN: 11 mg/dL (ref 8–23)
CO2: 25 mmol/L (ref 22–32)
Calcium: 8.5 mg/dL — ABNORMAL LOW (ref 8.9–10.3)
Chloride: 102 mmol/L (ref 98–111)
Creatinine, Ser: 1.14 mg/dL — ABNORMAL HIGH (ref 0.44–1.00)
GFR, Estimated: 55 mL/min — ABNORMAL LOW (ref >60)
Glucose, Bld: 139 mg/dL — ABNORMAL HIGH (ref 70–99)
Potassium: 4.4 mmol/L (ref 3.5–5.1)
Sodium: 137 mmol/L (ref 135–145)

## 2022-05-02 LAB — PROTIME-INR
INR: 1.1 (ref 0.8–1.2)
Prothrombin Time: 13.9 seconds (ref 11.4–15.2)

## 2022-05-02 LAB — LACTIC ACID, PLASMA
Lactic Acid, Venous: 2 mmol/L (ref 0.5–1.9)
Lactic Acid, Venous: 1.9 mmol/L (ref 0.5–1.9)

## 2022-05-02 LAB — STREP PNEUMONIAE URINARY ANTIGEN: Strep Pneumo Urinary Antigen: NEGATIVE

## 2022-05-02 MED ORDER — SODIUM CHLORIDE 0.9 % IV SOLN: 1000.0000 mL | INTRAVENOUS | Status: DC

## 2022-05-02 MED ORDER — ACETAMINOPHEN 325 MG PO TABS
650.0000 mg | ORAL_TABLET | Freq: Four times a day (QID) | ORAL | Status: DC | PRN
  Administered 2022-05-02: 650 mg via ORAL
  Filled 2022-05-02 (×2): qty 2

## 2022-05-02 MED ORDER — ALBUTEROL SULFATE (2.5 MG/3ML) 0.083% IN NEBU
2.5000 mg | INHALATION_SOLUTION | RESPIRATORY_TRACT | Status: DC | PRN
Start: 2022-05-02 — End: 2022-05-09
  Administered 2022-05-03: 2.5 mg via RESPIRATORY_TRACT
  Filled 2022-05-02: qty 3

## 2022-05-02 MED ORDER — SODIUM CHLORIDE 0.9 % IV SOLN
2.0000 g | Freq: Once | INTRAVENOUS | Status: AC
  Administered 2022-05-02: 2 g via INTRAVENOUS
  Filled 2022-05-02: qty 20

## 2022-05-02 MED ORDER — ACETAMINOPHEN 325 MG PO TABS
650.0000 mg | ORAL_TABLET | Freq: Four times a day (QID) | ORAL | Status: DC | PRN
Start: 2022-05-02 — End: 2022-05-04
  Administered 2022-05-02 – 2022-05-04 (×3): 650 mg via ORAL
  Filled 2022-05-02 (×3): qty 2

## 2022-05-02 MED ORDER — REVEFENACIN 175 MCG/3ML IN SOLN
175.0000 ug | Freq: Every day | RESPIRATORY_TRACT | Status: DC
  Administered 2022-05-02 – 2022-05-09 (×8): 175 ug via RESPIRATORY_TRACT
  Filled 2022-05-02 (×9): qty 3

## 2022-05-02 MED ORDER — AZITHROMYCIN 500 MG PO TABS
500.0000 mg | ORAL_TABLET | Freq: Every day | ORAL | Status: DC
  Administered 2022-05-02 – 2022-05-04 (×3): 500 mg via ORAL
  Filled 2022-05-02: qty 1
  Filled 2022-05-02: qty 2
  Filled 2022-05-02: qty 1

## 2022-05-02 MED ORDER — SODIUM CHLORIDE 0.9 % IV SOLN: INTRAVENOUS | Status: DC

## 2022-05-02 MED ORDER — PRAVASTATIN SODIUM 10 MG PO TABS
20.0000 mg | ORAL_TABLET | Freq: Every day | ORAL | Status: DC
  Administered 2022-05-02 – 2022-05-08 (×6): 20 mg via ORAL
  Filled 2022-05-02 (×7): qty 2

## 2022-05-02 MED ORDER — NICOTINE 14 MG/24HR TD PT24
14.0000 mg | MEDICATED_PATCH | Freq: Every day | TRANSDERMAL | Status: DC
  Administered 2022-05-02 – 2022-05-09 (×8): 14 mg via TRANSDERMAL
  Filled 2022-05-02 (×8): qty 1

## 2022-05-02 MED ORDER — IPRATROPIUM-ALBUTEROL 0.5-2.5 (3) MG/3ML IN SOLN
3.0000 mL | RESPIRATORY_TRACT | Status: DC
  Administered 2022-05-02 (×2): 3 mL via RESPIRATORY_TRACT
  Filled 2022-05-02 (×2): qty 3

## 2022-05-02 MED ORDER — ARFORMOTEROL TARTRATE 15 MCG/2ML IN NEBU
15.0000 ug | INHALATION_SOLUTION | Freq: Two times a day (BID) | RESPIRATORY_TRACT | Status: DC
  Administered 2022-05-02 – 2022-05-09 (×14): 15 ug via RESPIRATORY_TRACT
  Filled 2022-05-02 (×13): qty 2

## 2022-05-02 MED ORDER — SENNOSIDES-DOCUSATE SODIUM 8.6-50 MG PO TABS: 1.0000 | ORAL_TABLET | Freq: Every evening | ORAL | Status: DC | PRN

## 2022-05-02 MED ORDER — POLYETHYLENE GLYCOL 3350 17 G PO PACK
17.0000 g | PACK | Freq: Every day | ORAL | Status: DC
  Administered 2022-05-03: 17 g via ORAL
  Filled 2022-05-02 (×5): qty 1

## 2022-05-02 MED ORDER — ENOXAPARIN SODIUM 40 MG/0.4ML IJ SOSY
40.0000 mg | PREFILLED_SYRINGE | INTRAMUSCULAR | Status: DC
  Administered 2022-05-02 – 2022-05-09 (×8): 40 mg via SUBCUTANEOUS
  Filled 2022-05-02 (×8): qty 0.4

## 2022-05-02 MED ORDER — SODIUM CHLORIDE 0.9 % IV SOLN
1.0000 g | INTRAVENOUS | Status: DC
  Administered 2022-05-03 – 2022-05-04 (×2): 1 g via INTRAVENOUS
  Filled 2022-05-02 (×2): qty 10

## 2022-05-02 MED ORDER — PREDNISONE 20 MG PO TABS
40.0000 mg | ORAL_TABLET | Freq: Every day | ORAL | Status: AC
  Administered 2022-05-03 – 2022-05-06 (×4): 40 mg via ORAL
  Filled 2022-05-02 (×4): qty 2

## 2022-05-02 MED ORDER — SODIUM CHLORIDE 0.9 % IV BOLUS (SEPSIS)
1000.0000 mL | Freq: Once | INTRAVENOUS | Status: AC
  Administered 2022-05-02: 1000 mL via INTRAVENOUS

## 2022-05-02 MED ORDER — IOHEXOL 350 MG/ML SOLN
100.0000 mL | Freq: Once | INTRAVENOUS | Status: AC | PRN
  Administered 2022-05-02: 100 mL via INTRAVENOUS

## 2022-05-02 MED ORDER — METHYLPREDNISOLONE SODIUM SUCC 125 MG IJ SOLR
125.0000 mg | Freq: Once | INTRAMUSCULAR | Status: AC
  Administered 2022-05-02: 125 mg via INTRAVENOUS
  Filled 2022-05-02: qty 2

## 2022-05-02 MED ORDER — IPRATROPIUM-ALBUTEROL 0.5-2.5 (3) MG/3ML IN SOLN
3.0000 mL | Freq: Once | RESPIRATORY_TRACT | Status: AC
  Administered 2022-05-02: 3 mL via RESPIRATORY_TRACT
  Filled 2022-05-02: qty 3

## 2022-05-02 MED ORDER — IPRATROPIUM-ALBUTEROL 0.5-2.5 (3) MG/3ML IN SOLN
3.0000 mL | Freq: Four times a day (QID) | RESPIRATORY_TRACT | Status: DC
Start: 2022-05-03 — End: 2022-05-04
  Administered 2022-05-03 – 2022-05-04 (×5): 3 mL via RESPIRATORY_TRACT
  Filled 2022-05-02 (×5): qty 3

## 2022-05-02 MED ORDER — BUDESONIDE 0.25 MG/2ML IN SUSP
0.2500 mg | Freq: Two times a day (BID) | RESPIRATORY_TRACT | Status: DC
  Administered 2022-05-02 – 2022-05-09 (×14): 0.25 mg via RESPIRATORY_TRACT
  Filled 2022-05-02 (×14): qty 2

## 2022-05-02 NOTE — ED Notes (Signed)
Assisted up to bedside commode , patient only needs 1 assist

## 2022-05-02 NOTE — ED Notes (Signed)
Patient incont of urine and stool, assisted up to bedside commode  patient cleaned and linens changed. Patient back to bed.

## 2022-05-02 NOTE — ED Notes (Signed)
Patient assisted back to bed from [facility] only 1 assist needed.

## 2022-05-02 NOTE — H&P (Cosign Needed Addendum)
NAME:  Kim [name], MRN:  [id number], DOB:  October 23, 1961, LOS: 0 ADMISSION DATE:  05/02/2022, Primary: Nechama Guard, FNP  CHIEF COMPLAINT:  dyspnea    Medical Service: Internal Medicine Teaching Service         Attending Physician: Dr. Dickie La, MD    First Contact: Dr. Daiva Eves Pager: [phone]  Second Contact: Dr. Austin Miles Pager: [phone]       After Hours (After 5p/  First Contact Pager: [phone]  weekends / holidays): Second Contact Pager: [phone]   HISTORY OF PRESENT ILLNESS  Kim [name] is 60yo person with COPD not on supplemental oxygen, obstructive sleep apnea not on CPAP, OHS, hypertension, hyperlipidemia, class III obesity presenting to [facility] with one day of dyspnea. Kim [name] reports she began feeling unwell a couple days ago but started to notice dyspnea worsening yesterday. She has had increased cough, but denies increased sputum production. Denies fevers, chills, body aches, sick contacts. She has been eating and drinking normally, no change in bowel habits recently. She reports she does not have a primary care physician that she sees regularly and is not taking medications right now. During our interview, Kim [name] was intermittently somnolent and receiving nebulizer treatment.   PCP: Nechama Guard, FNP  ED COURSE  On arrival to [facility], patient was tachycardic, tachypneic, sating well on 5L nasal cannula. Lab work revealed hypoalbuminemia, mildly elevated troponin, leukocytosis, and erythrocytosis. Imaging with slightly improved aeration when compared to previous, no obvious focal consolidation. IMTS was subsequently consulted for admission.  PAST MEDICAL HISTORY  She,  has a past medical history of COPD (chronic obstructive pulmonary disease) (HCC), High cholesterol, and Hypertension.   HOME MEDICATIONS   Patient is currently not taking any medications.   ALLERGIES   Allergies as of 05/02/2022   (No Known Allergies)    SOCIAL HISTORY  Social  history limited given acuity of her illness. Kim [name] currently lives in [place] with her husband. She is a current smoker, 1 ppd for many years. Denies alcohol or other drug use.   FAMILY HISTORY  Her family history includes CAD in her mother.   REVIEW OF SYSTEMS  ROS per history of present illness.  PHYSICAL EXAMINATION  Blood pressure (!) 145/70, pulse 80, temperature (!) 101.9 F (38.8 C), temperature source Rectal, resp. rate 17, SpO2 94 %.    There were no vitals filed for this visit.  GENERAL: Obese person in bed in mild acute respiratory distress HENT: Normocephalic, atraumatic. Dry mucous membranes with cracked lips.  EYES: Vision grossly in tact. No scleral icterus or conjunctival injection appreciated. CV: Tachycardic, regular rhythm. No murmurs appreciated. Distal pulses 2+ bilaterally. No JVD appreciated. PULM: Increased work of breathing on nasal cannula. Decreased breath sounds throughout, mild end-expiratory wheezing appreciated. No rhonchi or rales. ABD:  Soft, non-tender, non-distended. Normoactive bowel sounds. MSK: Normal bulk, tone. No pitting edema bilateral lower extremities. SKIN: Warm, dry. No rashes or lesions appreciated. NEURO: Somnolent, but easily arousible. Conversing appropriately. Grossly non-focal. PSYCH: Normal mood, affect, speech.  SIGNIFICANT DIAGNOSTIC TESTS  ECG: Tachycardic. Right axis deviation with right atrial enlargement. Chronic T-wave inversion in V1.  I personally reviewed patient's ECG with my interpretation as above.  CXR: Improved aeration from previous CXR. Widened mediastinum, appears chronic. R lower lobe mild opacity, although appears similar to previous imaging.   I personally reviewed patient's CXR with my interpretation as above.  LABS      Latest Ref Rng & Units 05/02/2022  3:31 AM 05/02/2022    3:23 AM 03/28/2020    4:07 PM  CBC  WBC 4.0 - 10.5 K/uL  19.4  9.5   Hemoglobin 12.0 - 15.0 g/dL 28.4  13.2  44.0    Hematocrit 36.0 - 46.0 % 55.0  52.0  42.4   Platelets 150 - 400 K/uL  238  316       Latest Ref Rng & Units 05/02/2022    3:31 AM 05/02/2022    3:23 AM 03/28/2020    4:07 PM  BMP  Glucose 70 - 99 mg/dL 102  725  366   BUN 8 - 23 mg/dL 24  17  8    Creatinine 0.44 - 1.00 mg/dL  4.40  3.47   Sodium 135 - 145 mmol/L 135  137  137   Potassium 3.5 - 5.1 mmol/L 6.0  4.8  3.1   Chloride 98 - 111 mmol/L 101  99  103   CO2 22 - 32 mmol/L  26  24   Calcium 8.9 - 10.3 mg/dL  8.9  8.9     CONSULTS  N/a  ASSESSMENT  Kim [name] is 60yo person with COPD not on supplemental oxygen, obstructive sleep apnea not on CPAP, OHS, hypertension, hyperlipidemia, class III obesity admitted 7/3 with acute hypoxic respiratory failure from COPD exacerbation likely from underlying infection.  PLAN  Principal Problem:   Acute hypoxemic respiratory failure (HCC) Active Problems:   COPD exacerbation (HCC)   Essential hypertension   OSA (obstructive sleep apnea)   Erythrocytosis   Obesity, Class III, BMI 40-49.9 (morbid obesity) (HCC)   CKD (chronic kidney disease), stage III (HCC)  #Acute hypoxic respiratory failure #COPD exacerbation #Community-acquired pneumonia #OHS/OSA Kim [name] presents today febrile to 101.65F, tachycardic, tachypneic, and hypertensive with an increase in oxygen requirements. Lab revealed mildly elevated BNP at 155 along with leukocytosis and erythrocytosis. Likely Kim [name] has multiple processes contributing to her current symptoms. I do believe she is in an acute COPD exacerbation with 2/3 cardinal symptoms and wheezing on exam. Given her somnolence, I attempted to get ABG but patient refused. I suspect she has component of acute on chronic hypercapnia driving this. She is also experiencing fevers here in the ED. CXR and CT without any focal consolidations, urine clean, no other obvious source of infection. Possibly early bacterial pneumonia vs viral infection. We will cover for  CAP empirically. Her BNP is also slightly elevated, no formal diagnosis of HF in the past. Last Echo 2020 revealed grade I diastolic dysfunction. On exam here, she does not appear hypervolemic. Will hold off of diuresis today, continue with antibiotics, steroids, and bronchodilators. Can consider repeating Echo during hospitalization. - Prednisone 40mg  daily (7/3-/7/7) - IV ceftriaxone 1g + PO azithromycin 500mg  daily - DuoNebs q4h while awake - Albuterol q4h PRN - Aformoterol nebulizer twice daily - Yupleri nebulizer daily - Budesonide nebulizer twice daily - Follow-up blood cultures - Follow-up Strep pneumo, Legionella urine antigens - Needs outpatient follow-up and PFT's - Needs outpatient sleep study - Consider repeat Echocardiogram - PT/OT  #Troponinemia Trop 45>74 thus far. Patient is not having any chest pain and no ischemic changes on ECG. Most likely has mild troponin leak related to demand from current illness. We will keep trending these until flattened.  - Follow-up troponin  #Hypertension Patient has remained hypertensive since arrival, likely around her baseline. Is not on any medications currently. Will hold off starting anti-hypertensive given acute illness. Would consider Echo for any structural  heart disease to help determine best hypertension meds. - Hold antihypertensives - No IV PRN  #Chronic kidney disease stage IIIa Renal function close to baseline, mildly elevated at 1.26 from 1.0-1.1. Might be slightly hypovolemic with acute illness. She received IV fluids for a few hours this morning, will stop these now. She is able to tolerate PO and is hemodynamically stable. - Encourage PO intake - Daily BMP - Strict I/O - Avoid nephrotoxins  #Erythrocytosis Hgb 16.0 today, up from 13.7 a few years ago. I suspect this is 2/2 chronic hypoxia. However, we will obtain EPO level to determine if JAK2 testing is needed.  - Daily CBC - Follow-up EPO level  tomorrow  #Hyperlipidemia Continue with home statin. I do not see a previous lipid panel, we will follow this up in the morning. - Continue home pravastatin - Repeat lipid panel in AM  #Hyperglycemia No previous history of diabetes, I do not see a previous A1c. Will obtain while inpatient. - Follow-up A1c  #Class III obesity Patient currently does not have PCP. Would consider outpatient referral to weight loss management, could also benefit from GLP-1 agonist.  #Constipation - Daily Miralax - Senokot-S PRN  #Tobacco use disorder - Nicotine patch  BEST PRACTICE  DIET: Regular IVF: n/a DVT PPX: lovenox BOWEL: Miralax, Senokot-S CODE: FULL FAM COM: Will update patient's husband per patient request  DISPO: Admit patient to Inpatient with expected length of stay greater than 2 midnights.  Sanjuan Dame, MD Internal Medicine Resident PGY-3 PAGER: [phone] 05/02/2022 1:10 PM  If after hours (below), please contact on-call pager: [phone] 5PM-7AM Monday-Friday 1PM-7AM Saturday-Sunday

## 2022-05-02 NOTE — Progress Notes (Signed)
RT in to attempt abg. Successful on first try but patient pulled arm away and sample was lost. Patient refuses any further sticks. RN and Dr. Rodena Medin aware. No new orders received. RT will continue to be available as needed.

## 2022-05-02 NOTE — ED Notes (Signed)
The pt is refusing to allow the bp cuff  back on her arm after she jerked it off

## 2022-05-02 NOTE — ED Provider Notes (Signed)
[facility] EMERGENCY DEPARTMENT Provider Note  CSN: [id number] Arrival date & time: 05/02/22 0240  Chief Complaint(s) Shortness of Breath  HPI Kim [name] is a 61 y.o. female with a past medical history listed below including COPD not requiring supplemental oxygen who per presents to the emergency department with 1 day of generalized fatigue and several hours of shortness of breath.  Patient endorses dry cough.  No known sick contacts.  No known fevers or chills.  No nausea or vomiting.  Patient is having intermittent abdominal discomfort but is currently not have any abdominal pain.  She is endorsing several days of constipation.  No urinary symptoms.  Patient was brought in by EMS on 4 L nasal cannula and patient was satting 94%.  She was tachycardic to the 120s and hypertensive with systolics in the [id number].  The history is provided by the patient and the EMS personnel.    Past Medical History Past Medical History:  Diagnosis Date   COPD (chronic obstructive pulmonary disease) ([place])    High cholesterol    Hypertension    Patient Active Problem List   Diagnosis Date Noted   OSA (obstructive sleep apnea)    Acute pulmonary edema (HCC)    Acute respiratory failure with hypoxia ([place]) 09/02/2019   COPD exacerbation ([place]) 09/02/2019   Essential hypertension 09/02/2019   HLD (hyperlipidemia) 09/02/2019   Home Medication(s) Prior to Admission medications   Medication Sig Start Date End Date Taking? Authorizing Provider  acetaminophen (TYLENOL) 500 MG tablet Take 500 mg by mouth every 6 (six) hours as needed for headache (pain).    [provider]  amLODipine (NORVASC) 5 MG tablet Take 1 tablet (5 mg total) by mouth daily. 06/06/17   Daleen Bo, MD  budesonide (PULMICORT) 0.5 MG/2ML nebulizer solution Take 2 mLs (0.5 mg total) by nebulization 2 (two) times daily. 09/13/19   Charolotte Capuchin, MD  nicotine (NICODERM CQ - DOSED IN MG/24 HOURS) 14 mg/24hr  patch Place 1 patch (14 mg total) onto the skin daily. 09/14/19   Charolotte Capuchin, MD  pravastatin (PRAVACHOL) 20 MG tablet Take 20 mg by mouth at bedtime.  07/02/18   [provider]                                                                                                                                    Allergies Patient has no known allergies.  Review of Systems Review of Systems As noted in HPI  Physical Exam Vital Signs  I have reviewed the triage vital signs BP (!) 172/79 (BP Location: Right Arm)   Pulse (!) 110   Temp (!) 101.9 F (38.8 C) (Rectal)   Resp (!) 28   SpO2 94%   Physical Exam Vitals reviewed.  Constitutional:      General: She is not in acute distress.    Appearance: She is well-developed. She is obese. She is  ill-appearing. She is not diaphoretic.  HENT:     Head: Normocephalic and atraumatic.     Nose: Nose normal.  Eyes:     General: No scleral icterus.       Right eye: No discharge.        Left eye: No discharge.     Conjunctiva/sclera: Conjunctivae normal.     Pupils: Pupils are equal, round, and reactive to light.  Cardiovascular:     Rate and Rhythm: Regular rhythm. Tachycardia present.     Heart sounds: No murmur heard.    No friction rub. No gallop.  Pulmonary:     Effort: Pulmonary effort is normal. No respiratory distress.     Breath sounds: No stridor. Examination of the right-lower field reveals rales. Examination of the left-lower field reveals rales. Rales present.  Abdominal:     General: There is no distension.     Palpations: Abdomen is soft.     Tenderness: There is no abdominal tenderness. There is no guarding or rebound.  Musculoskeletal:        General: No tenderness.     Cervical back: Normal range of motion and neck supple.     Right lower leg: No edema.     Left lower leg: No edema.  Skin:    General: Skin is warm and dry.     Findings: No erythema or rash.  Neurological:     Mental Status: She is alert  and oriented to person, place, and time.     ED Results and Treatments Labs (all labs ordered are listed, but only abnormal results are displayed) Labs Reviewed  LACTIC ACID, PLASMA - Abnormal; Notable for the following components:      Result Value   Lactic Acid, Venous 2.0 (*)    All other components within normal limits  COMPREHENSIVE METABOLIC PANEL - Abnormal; Notable for the following components:   Glucose, Bld 112 (*)    Creatinine, Ser 1.26 (*)    Albumin 3.0 (*)    GFR, Estimated 49 (*)    All other components within normal limits  CBC WITH DIFFERENTIAL/PLATELET - Abnormal; Notable for the following components:   WBC 19.4 (*)    RBC 5.85 (*)    Hemoglobin 16.0 (*)    HCT 52.0 (*)    RDW 19.6 (*)    Neutro Abs 18.0 (*)    Abs Immature Granulocytes 0.14 (*)    All other components within normal limits  URINALYSIS, ROUTINE W REFLEX MICROSCOPIC - Abnormal; Notable for the following components:   Protein, ur 100 (*)    All other components within normal limits  BRAIN NATRIURETIC PEPTIDE - Abnormal; Notable for the following components:   B Natriuretic Peptide 155.4 (*)    All other components within normal limits  I-STAT CHEM 8, ED - Abnormal; Notable for the following components:   Potassium 6.0 (*)    BUN 24 (*)    Creatinine, Ser 1.10 (*)    Glucose, Bld 110 (*)    Calcium, Ion 1.00 (*)    Hemoglobin 18.7 (*)    HCT 55.0 (*)    All other components within normal limits  TROPONIN I (HIGH SENSITIVITY) - Abnormal; Notable for the following components:   Troponin I (High Sensitivity) 45 (*)    All other components within normal limits  TROPONIN I (HIGH SENSITIVITY) - Abnormal; Notable for the following components:   Troponin I (High Sensitivity) 74 (*)    All other components within  normal limits  RESP PANEL BY RT-PCR (FLU A&B, COVID) ARPGX2  CULTURE, BLOOD (ROUTINE X 2)  CULTURE, BLOOD (ROUTINE X 2)  LACTIC ACID, PLASMA  PROTIME-INR  APTT                                                                                                                          EKG  EKG Interpretation  Date/Time:  Monday May 02 2022 02:51:59 EDT Ventricular Rate:  119 PR Interval:  168 QRS Duration: 81 QT Interval:  316 QTC Calculation: 445 R Axis:   173 Text Interpretation: Sinus tachycardia Right atrial enlargement Consider right ventricular hypertrophy Rate faster Confirmed by Drema Pry [phone]) on 05/02/2022 3:08:50 AM       Radiology DG Chest Port 1 View  Result Date: 05/02/2022 CLINICAL DATA:  Questionable sepsis. EXAM: PORTABLE CHEST 1 VIEW COMPARISON:  Mar 28, 2020 FINDINGS: The cardiac silhouette is borderline in size and unchanged in appearance. There is no evidence of acute infiltrate, pleural effusion or pneumothorax. The visualized skeletal structures are unremarkable. IMPRESSION: No active cardiopulmonary disease. Electronically Signed   By: Aram Candela M.D.   On: 05/02/2022 03:06    Pertinent labs & imaging results that were available during my care of the patient were reviewed by me and considered in my medical decision making (see MDM for details).  Medications Ordered in ED Medications  sodium chloride 0.9 % bolus 1,000 mL (0 mLs Intravenous Stopped 05/02/22 0605)    Followed by  0.9 %  sodium chloride infusion (has no administration in time range)  acetaminophen (TYLENOL) tablet 650 mg (650 mg Oral Given 05/02/22 0604)  ipratropium-albuterol (DUONEB) 0.5-2.5 (3) MG/3ML nebulizer solution 3 mL (has no administration in time range)  methylPREDNISolone sodium succinate (SOLU-MEDROL) 125 mg/2 mL injection 125 mg (has no administration in time range)  0.9 %  sodium chloride infusion (has no administration in time range)  cefTRIAXone (ROCEPHIN) 2 g in sodium chloride 0.9 % 100 mL IVPB (0 g Intravenous Stopped 05/02/22 0533)  iohexol (OMNIPAQUE) 350 MG/ML injection 100 mL (100 mLs Intravenous Contrast Given 05/02/22 0647)                                                                                                                                      Procedures .Critical Care  Performed by: Nira Conn, MD Authorized by: Nira Conn, MD  Critical care provider statement:    Critical care time (minutes):  65   Critical care time was exclusive of:  Separately billable procedures and treating other patients   Critical care was necessary to treat or prevent imminent or life-threatening deterioration of the following conditions:  Respiratory failure and sepsis   Critical care was time spent personally by me on the following activities:  Development of treatment plan with patient or surrogate, discussions with consultants, evaluation of patient's response to treatment, examination of patient, obtaining history from patient or surrogate, review of old charts, re-evaluation of patient's condition, pulse oximetry, ordering and review of radiographic studies, ordering and review of laboratory studies and ordering and performing treatments and interventions   (including critical care time)  Medical Decision Making / ED Course    Complexity of Problem:  Patient's presenting problem/concern, DDX, and MDM listed below: Generalized malaise with SOB Noted to be hypoxic on room air satting 84%.  Placed back on 4 L nasal cannula Patient is warm to the touch and tachycardic We will need to assess for infectious process including pneumonia, COVID/influenza.  We will also rule out UTI. We will assess for heart failure. If above is unrevealing, will also reconsider possible pulmonary embolism but I have a lower suspicion for this at this time.  Hospitalization Considered:  Yes      Complexity of Data:   Cardiac Monitoring: The patient was maintained on a cardiac monitor.   I personally viewed and interpreted the cardiac monitored which showed an underlying rhythm of sinus tachycardia with rates in the 110s to 120s EKG  was sinus tachycardia.  No dysrhythmias, or acute ischemia.  Laboratory Tests ordered listed below with my independent interpretation: CBC with leukocytosis and left shift.  Also evidence of hemoconcentration. Metabolic panel without significant electrolyte derangements.  Mild renal sufficiency.  No evidence of bili obstruction UA without evidence of infection BNP less than 200 Initial troponin slightly elevated at 45, repeat at 74.  Likely demand from tachycardia and infection.   Imaging Studies ordered listed below with my independent interpretation: Chest x-ray without evidence of pneumonia, pneumothorax, pulmonary edema CTA ordered to rule out PE and better visualize lung parenchyma for pneumonia.  Currently pending CT abdomen obtained to rule out intra-abdominal inflammatory/infectious process.  Currently pending     ED Course:    Assessment, Add'l Intervention, and Reassessment: Hypoxia Satting well on 3 L nasal cannula Most suspicious for infectious process. Work-up thus far has been unrevealing. She was started on empiric antibiotics.  Pending CTA to rule out PE/PNA and CT abd for intrabdominal infection. Plan to admit after imaging Patient care turned over to oncoming provider. Patient case and results discussed in detail; please see their note for further ED managment.   Final Clinical Impression(s) / ED Diagnoses Final diagnoses:  Hypoxia  Sepsis with acute hypoxic respiratory failure without septic shock, due to unspecified organism [facility])           This chart was dictated using voice recognition software.  Despite best efforts to proofread,  errors can occur which can change the documentation meaning.    Fatima Blank, MD 05/02/22 [phone]

## 2022-05-02 NOTE — Sepsis Progress Note (Signed)
Elink following code sepsis °

## 2022-05-02 NOTE — ED Triage Notes (Signed)
Pt BIB EMS from home for shortness of breath that began an hour ago and general malaise over the past 24 hours. Pt also complaining of constipation no remembering when her last bowel movement was.   220/110 93-94% 4L  84% RA CBG 102 ST 126

## 2022-05-02 NOTE — ED Provider Notes (Signed)
Admitting medicine service made aware of case.  They will evaluate for admission.     Wynetta Fines, MD 05/02/22 [phone]

## 2022-05-02 NOTE — ED Notes (Signed)
Bishop Dublin sons girlfriend [phone] requesting an update on the patient

## 2022-05-02 NOTE — ED Notes (Signed)
Called husband at patient request , he states he is on his way.

## 2022-05-02 NOTE — Hospital Course (Addendum)
Dear Ms. Dineen,  You came to the hospital because of shortness of breath. We admitted you because you were having trouble breathing and needing a lot more oxygen than you usually need at home. We initially treated this with COPD medications, antibiotics, and steroids, which helped you. You also had a fever and found that your white blood cell count was elevated. After taking a picture of your abdomen showed diverticulosis, an outpouching of your intestines, and some thickening that suggested inflammation. We got blood cultures and it showed an infection with a bacteria called ESBL E Coli. Because of this, you will continue on IV antibiotics until 05/14/2022. You are stable and safe for discharge today. We have arranged a nurse to come to your home and help with the IV antibiotic administration.   You will need to follow up with a new primary care provider at the [facility] Internal Medicine clinic on May 17, 2022 at 8:45 am. Please schedule an appointment with [facility] on [address] at [phone] to gain strength after your hospitalization.   [facility], home infusion agency, will come out tomorrow to administer IV antibiotics on 05/10/2022  Take care, Your [facility] inpatient team  7/8 She was able to sleep well overnight, used bipap for longer. She is okay if she needs to stay another night for antibiotics here but would like to go home today if possible. She has not been wheezing today, breathing has improved.     New medications  - Albuterol inhaler q4HR PRN - Anoro Ellipta daily  Continue    -------- Dont erase below  Follow up:  ------ ***** EXAM: GENERAL:tired, appreating person sitting in bed in no apparent distress CV: regular side effect, regular rhythm. No murmurs appreciated. Distal pulses 2+ bilaterally. No JVD appreciated. PULM: not increased work of breahting from prior without [place] cannula today. Clear to auscultation bilaterally,  with mild crackled on the L bases not changed from prior. No wheezes. ABD: Normal BS, non tender to palpation, non distended. MSK: No gross abnormalities. Trace pitting edema SKIN: Warm and dry.  No rashes. NEURO Somnolent, but easily arausable, baseline. A&O x3 PSYCH: Appropriate mood and affect  ------  Jacara Benito is a 61 yo woman with a hx of COPD, HTN, CKD3a, HLD, OSA without home BiPAP admitted with acute hypoxic respiratory failure secondary to COPD exacerbation , for which she completed acute treatment, and found to have ESBL E coli bacteremia being discharged with antibiotic treatment  ESBL E Coli bacteremia Likely in the setting of intraabdominal infection  Leukocytosis, improving Patient with CT significant for diverticulosis with asymmetrical wall thickening involving sigmoid colon without inflammatory fat stranding noted on 7/3. Given leukocytosis and no other source of infection identified during this admission, this is likely the source of infection. ID was consulted for antibiotic management and meropenem therapy started on 7/6.   COPD exacerbation Acute hypoxic respiratory failure Respiratory acidosis  OSA Tolerated 30 min of BiPAP on 7/6. vBG today, Patient admitted for acute hypoxic respiratory failure, initially thought to be secondary to COPD exacerbation in the setting of respiratory infection. CXR unremarkable, CT angio negative for PE, consolidation or airspace disease, Echo showed normal LVEF 60-65% with normal LV function. RV systolic funcion low normal, and RV moderately enlarged with elevated pulmonary artery systolic pressure, likely in the setting of known pulmonary disease. Initially treated empirically for community acquired pneumonia with IV ceftriaxone. On 7/5, blood cultures identified ESBL E coli bacteremia and treatment  was escalated to meropenem. Patient completed COPD exacerbation course on 7/7 and is back to her baseline of 3L O2, no wheezing or  crackles on ausculation. Continues to retain CO2 and only able to tolerate 30 min of BiPAP treatment.  Patient is being discharged on Albuterol and budesonide inhaler, Anoro Ellipta, and budesonide nebulizer. Recommend PFTs  and sleep study on follow up.    Discharged with - Albuterol inhaler q4HR PRN - Anoro Ellipta (LAMA + LABA) - Continue on budesonide nebulizer    For home: - Outpatient sleep study - Outpatient LFTs   HTN  SBPs during hospitalizationg had been in the low 140s, without medical management until the past couple of days. BP 211/97 at on 7/7 without headache, blurry vision, chest pain or change in respiratory status. Patient started on amlodipine 5mg  on 7/6. Previously 12 beat VTACH runs on tele. Patient refuses to wear telemetry leads; will address today given increased bloos pressures. Mg, K***. Consider outpatient cardiac monitoring***   Constipation Patient with hx of no BM and abdominal pain for a week on admission. Pain resolved after Miralax and Senna-S therapy. Last BM Continue therapy as outpatient.   CKD, 3a Baseline 1-1.1. Today 1.17  ?Peripheral neuropathy Intermitted tingling in bilateral feet. No history of PAD with good distal pulses. No hx of DM, A1c 5.8%. Normal vitB12. Follow outpatient.    Obesity, class III  HDL Stable. Continued on home pravastatin.   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 EPO within normal level 3.9.  No OT follow up, BSC/3in1

## 2022-05-02 NOTE — ED Notes (Addendum)
Multiple staff in room trying to clean pt up, pt came out of room and sat in chair, refusing to go back into room, MD Cardama at bedside, pt still refusing to go back into room. Security at bedside. This RN was finally able to get back into bed. Pt O2 reading in the 70s on room air, placed on [place] with little improvement. Pt started on NRB with improvement into the low 90s. Primary RN and MD made aware

## 2022-05-03 ENCOUNTER — Encounter (HOSPITAL_COMMUNITY): Payer: Self-pay | Admitting: Internal Medicine

## 2022-05-03 LAB — BASIC METABOLIC PANEL
Anion gap: 11 (ref 5–15)
BUN: 15 mg/dL (ref 8–23)
CO2: 25 mmol/L (ref 22–32)
Calcium: 9 mg/dL (ref 8.9–10.3)
Chloride: 104 mmol/L (ref 98–111)
Creatinine, Ser: 1.12 mg/dL — ABNORMAL HIGH (ref 0.44–1.00)
GFR, Estimated: 56 mL/min — ABNORMAL LOW (ref >60)
Glucose, Bld: 140 mg/dL — ABNORMAL HIGH (ref 70–99)
Potassium: 3.7 mmol/L (ref 3.5–5.1)
Sodium: 140 mmol/L (ref 135–145)

## 2022-05-03 LAB — BLOOD GAS, VENOUS
Acid-Base Excess: 0.5 mmol/L (ref 0.0–2.0)
Bicarbonate: 30.3 mmol/L — ABNORMAL HIGH (ref 20.0–28.0)
Drawn by: 6101
O2 Saturation: 42.7 %
Patient temperature: 36.6
pCO2, Ven: 73 mmHg (ref 44–60)
pH, Ven: 7.23 — ABNORMAL LOW (ref 7.25–7.43)
pO2, Ven: <31 mmHg — CL (ref 32–45)

## 2022-05-03 LAB — PROCALCITONIN: Procalcitonin: 3.35 ng/mL

## 2022-05-03 LAB — CBC
HCT: 48.6 % — ABNORMAL HIGH (ref 36.0–46.0)
Hemoglobin: 14.6 g/dL (ref 12.0–15.0)
MCH: 26.9 pg (ref 26.0–34.0)
MCHC: 30 g/dL (ref 30.0–36.0)
MCV: 89.5 fL (ref 80.0–100.0)
Platelets: 225 10*3/uL (ref 150–400)
RBC: 5.43 MIL/uL — ABNORMAL HIGH (ref 3.87–5.11)
RDW: 19.6 % — ABNORMAL HIGH (ref 11.5–15.5)
WBC: 17 10*3/uL — ABNORMAL HIGH (ref 4.0–10.5)
nRBC: 0 % (ref 0.0–0.2)

## 2022-05-03 LAB — HEMOGLOBIN A1C
Hgb A1c MFr Bld: 5.8 % — ABNORMAL HIGH (ref 4.8–5.6)
Mean Plasma Glucose: 119.76 mg/dL

## 2022-05-03 LAB — LIPID PANEL
Cholesterol: 176 mg/dL (ref 0–200)
HDL: 34 mg/dL — ABNORMAL LOW (ref >40)
LDL Cholesterol: 125 mg/dL — ABNORMAL HIGH (ref 0–99)
Total CHOL/HDL Ratio: 5.2 RATIO
Triglycerides: 87 mg/dL (ref <150)
VLDL: 17 mg/dL (ref 0–40)

## 2022-05-03 LAB — TSH: TSH: 1.373 u[IU]/mL (ref 0.350–4.500)

## 2022-05-03 LAB — HIV ANTIBODY (ROUTINE TESTING W REFLEX): HIV Screen 4th Generation wRfx: NONREACTIVE

## 2022-05-03 MED ORDER — ALUM & MAG HYDROXIDE-SIMETH 200-200-20 MG/5ML PO SUSP
15.0000 mL | Freq: Once | ORAL | Status: DC
Start: 2022-05-03 — End: 2022-05-09
  Filled 2022-05-03: qty 30

## 2022-05-03 NOTE — Evaluation (Signed)
Physical Therapy Evaluation Patient Details Name: Kim [name] MRN: [id number] DOB: [birth year] Today's Date: 05/03/2022  History of Present Illness  Pt is a 61 y/o female presenting on 7/3 with dyspnea. Admitted with acute hypoxic respiratory failure from COPD exacerbation. PMH includes: COPD, OSA, hypertension, obesity.  Clinical Impression  Pt's PT impairments include minor balance deficits, generalized weakness, and activity intolerance. Pt tolerates therapy well today, ambulating limited household distances without an AD. At baseline, Pt reports that she uses 3-4L supplemental O2 as needed, but doesn't consistently wear oxygen. Pt requires supplemental O2 at rest and during ambulation during admission. Pt requires 3L O2 initially during ambulation (same as rest), but desats into mid 80s. Pt takes standing rest break and has O2 increased to 6L and ambulates back to EOB with O2 sats maintained above 88%. Continued therapy is recommended to facilitate the Pt in increasing strength and activity tolerance so that she can return home and help take care of her family safely.      Recommendations for follow up therapy are one component of a multi-disciplinary discharge planning process, led by the attending physician.  Recommendations may be updated based on patient status, additional functional criteria and insurance authorization.  Follow Up Recommendations Outpatient PT      Assistance Recommended at Discharge PRN  Patient can return home with the following  A little help with bathing/dressing/bathroom;Assistance with cooking/housework    Equipment Recommendations Rollator (4 wheels) (Ambulates fine without AD, but rollator would provide a seat during ambulation in case Pt needs to rest when going increased distances)  Recommendations for Other Services       Functional Status Assessment Patient has had a recent decline in their functional status and demonstrates the ability to make  significant improvements in function in a reasonable and predictable amount of time.     Precautions / Restrictions Precautions Precautions: None Precaution Comments: watch O2 Restrictions Weight Bearing Restrictions: No      Mobility  Bed Mobility               General bed mobility comments: Not assessed    Transfers Overall transfer level: Modified independent Equipment used: None Transfers: Sit to/from Stand Sit to Stand: Modified independent (Device/Increase time)                Ambulation/Gait Ambulation/Gait assistance: Supervision (Monitor O2 sats and symptoms) Gait Distance (Feet): 60 Feet (Additional trial of 60' following standing rest break) Assistive device: None Gait Pattern/deviations: Step-through pattern, Decreased step length - right, Decreased step length - left, Decreased stride length Gait velocity: decreased Gait velocity interpretation: 1.31 - 2.62 ft/sec, indicative of limited community ambulator   General Gait Details: Pt begins ambulation on 3L O2 but desats into mid 80s; Pt takes standing rest break and O2 increased to 6L to maintain O2 above 88%  Stairs            Wheelchair Mobility    Modified Rankin (Stroke Patients Only)       Balance Overall balance assessment: Needs assistance Sitting-balance support: No upper extremity supported, Feet supported Sitting balance-Leahy Scale: Good     Standing balance support: No upper extremity supported, During functional activity Standing balance-Leahy Scale: Fair                               Pertinent Vitals/Pain Pain Assessment Pain Assessment: No/denies pain    Home Living Family/patient expects to be discharged  to:: Private residence Living Arrangements: Children;Spouse/significant other Available Help at Discharge: Family;Available PRN/intermittently Type of Home: House Home Access: Stairs to enter Entrance Stairs-Rails: Can reach both Entrance  Stairs-Number of Steps: 4 back, 6 front (Primarily uses back)   Home Layout: One level Home Equipment: None Additional Comments: reports having 3L O2 at home but only wearning it PRN; reports sometimes having trouble getting up and down from toilet    Prior Function Prior Level of Function : Independent/Modified Independent;Driving               ADLs Comments: Reports that she helps take care of her fiance who has cancer     Hand Dominance        Extremity/Trunk Assessment   Upper Extremity Assessment Upper Extremity Assessment: Generalized weakness    Lower Extremity Assessment Lower Extremity Assessment: Generalized weakness    Cervical / Trunk Assessment Cervical / Trunk Assessment: Normal Cervical / Trunk Exceptions: increased body habitus  Communication   Communication: No difficulties  Cognition Arousal/Alertness: Awake/alert Behavior During Therapy: WFL for tasks assessed/performed Overall Cognitive Status: Within Functional Limits for tasks assessed                                          General Comments General comments (skin integrity, edema, etc.): Pt requests to have O2 increased to 4L when returning to sit EOB    Exercises     Assessment/Plan    PT Assessment Patient needs continued PT services  PT Problem List Decreased strength;Decreased activity tolerance;Decreased balance;Decreased mobility;Cardiopulmonary status limiting activity;Obesity       PT Treatment Interventions DME instruction;Stair training;Gait training;Functional mobility training;Therapeutic exercise;Therapeutic activities;Balance training;Patient/family education    PT Goals (Current goals can be found in the Care Plan section)  Acute Rehab PT Goals Patient Stated Goal: Return home PT Goal Formulation: With patient Time For Goal Achievement: 05/17/22 Potential to Achieve Goals: Good    Frequency Min 3X/week     Co-evaluation                AM-PAC PT "6 Clicks" Mobility  Outcome Measure Help needed turning from your back to your side while in a flat bed without using bedrails?: None Help needed moving from lying on your back to sitting on the side of a flat bed without using bedrails?: None Help needed moving to and from a bed to a chair (including a wheelchair)?: None Help needed standing up from a chair using your arms (e.g., wheelchair or bedside chair)?: None Help needed to walk in hospital room?: A Little Help needed climbing 3-5 steps with a railing? : A Little 6 Click Score: 22    End of Session Equipment Utilized During Treatment: Oxygen Activity Tolerance: Patient tolerated treatment well Patient left: in bed;with call bell/phone within reach;with family/visitor present Nurse Communication: Mobility status PT Visit Diagnosis: Other abnormalities of gait and mobility (R26.89);Muscle weakness (generalized) (M62.81)    Time: 3976-7341 PT Time Calculation (min) (ACUTE ONLY): 22 min   Charges:   PT Evaluation $PT Eval Low Complexity: 1 Low          Murlean Hark, SPT Acute Rehabilitation Office #: [phone]   Murlean Hark 05/03/2022, 12:49 PM

## 2022-05-03 NOTE — Progress Notes (Signed)
Pt is refusing BIPAP. RT has explained reasons and benefits for wearing BIPAP and patient will not put it on. Patient is still wanting to do breathing treatments. Vitals are stable with SPO2 94 on 5L [place]. MD notified.

## 2022-05-03 NOTE — Progress Notes (Signed)
Patient continuing to refuse BiPAP.

## 2022-05-03 NOTE — Progress Notes (Signed)
Cardiac report received, 1900; SR 83

## 2022-05-03 NOTE — Progress Notes (Signed)
Hospital day#1 Subjective:   Overnight Events: None  She is not feeling well today, feels tired and sweaty. She is on 3L O2 at home. She feels like her shortness of breath is at her baseline. She is usually very sleepy all the time, does not use a CPAP at home.  Has not been tested for sleep apnea.  She has been coughing some, not producing any sputum. States she has intermittent pain in her chest when she is sleeping and it wakes her up. Has happened a few times over the past couple of months. Denies chest pain at this time.   Objective:  Vital signs in last 24 hours: Vitals:   05/03/22 0824 05/03/22 1233 05/03/22 1235 05/03/22 1500  BP:   134/61 134/68  Pulse:   91 86  Resp:   17 17  Temp:   97.7 F (36.5 C) (!) 97.4 F (36.3 C)  TempSrc:   Oral Oral  SpO2: (!) 88% 90% 90% 90%  Weight:      Height:       Supplemental O2:  SpO2: 90 % O2 Flow Rate (Kim/min): 5 Kim/min   Physical Exam:  Constitutional: Tired appearing woman with increased somnolence in mild distress HENT: normocephalic atraumatic, mucous membranes moist Cardiovascular: regular rate and rhythm, no m/r/g Pulmonary/Chest: normal work of breathing on 5L on [place]. Decreased breath sounds in the lower lung bases. No wheezing appreciated on exam Abdominal: soft, non-tender, non-distended MSK: normal bulk and tone, no pitting edema appreciated Neurological: somnolent but conversational & oriented x 3 Skin: warm and dry Psych: Normal mood, appropriate affect and speech.  Filed Weights   05/02/22 1905  Weight: 108.9 kg     Intake/Output Summary (Last 24 hours) at 05/03/2022 1838 Last data filed at 05/03/2022 1800 Gross per 24 hour  Intake 580 ml  Output --  Net 580 ml   Net IO Since Admission: 580 mL [05/03/22 1838]  Pertinent Labs:    Latest Ref Rng & Units 05/03/2022    1:22 AM 05/02/2022    3:31 AM 05/02/2022    3:23 AM  CBC  WBC 4.0 - 10.5 K/uL 17.0   19.4   Hemoglobin 12.0 - 15.0 g/dL 31.5  94.5  85.9    Hematocrit 36.0 - 46.0 % 48.6  55.0  52.0   Platelets 150 - 400 K/uL 225   238        Latest Ref Rng & Units 05/03/2022    1:22 AM 05/02/2022    2:07 PM 05/02/2022    3:31 AM  CMP  Glucose 70 - 99 mg/dL 292  446  286   BUN 8 - 23 mg/dL 15  11  24    Creatinine 0.44 - 1.00 mg/dL  3.81  7.71   Sodium 135 - 145 mmol/Kim 140  137  135   Potassium 3.5 - 5.1 mmol/Kim 3.7  4.4  6.0   Chloride 98 - 111 mmol/Kim 104  102  101   CO2 22 - 32 mmol/Kim 25  25    Calcium 8.9 - 10.3 mg/dL 9.0  8.5      Imaging: No results found.  Assessment/Plan:   Principal Problem:   Acute hypoxemic respiratory failure (HCC) Active Problems:   COPD exacerbation (HCC)   Essential hypertension   OSA (obstructive sleep apnea)   Erythrocytosis   Obesity, Class III, BMI 40-49.9 (morbid obesity) (HCC)   CKD (chronic kidney disease), stage III [facility])   Patient Summary: [facility]  Kim [name] is a 61 y.o. with a pertinent PMH of chronic hypoxic respiratory failure secondary to COPD on 3L oxygen at baseline, HTN, HLD, CKD3a, presenting with episodes of chills and dyspnea being managed for COPD exacerbation likely in the setting of community acquired pneumonia   COPD exacerbation Acute hypoxic respiratory failure Community acquired pneumonia OSA Continues on 5L [place] - 90-96%. Negative strep pneumo. Negative U/A. Pending blood cultures, treating empirically. WBC downtrending at 17. Continues to be afebrile. Concerned about patients day-time somnolence and fatigue, +PND: TSH normal, patient with OSA without sleep study.  vBG today pH 7.23, CO2 73, bicarbonate 25, consistent with acute respiratory acidosis. Ordered BiPAP but patient refused. Will plan to get a second vBG tomorrow and will encourage BiPAP usage if no change vBG status. Given PND will also get an echo today. - Follow up echo results - Follow up pro-calcitonin results - Prednisone 40mg  daily (7/3-/7/7) - IV ceftriaxone 1g + PO azithromycin 500mg  daily - DuoNebs q4h  while awake - Albuterol q4h PRN - Aformoterol nebulizer twice daily - Yupleri nebulizer daily - Budesonide nebulizer twice daily - Follow-up blood cultures, pending - Legionella urine antigens, pending - Needs outpatient follow-up and PFT's - Needs outpatient sleep study - repeat vBG  Hypertension SBPs ~134.  Patient has not been prescribed antihypertensive medications. Will continue to monitor to assess starting medications as inpatient. Will ofollow up echo to rule out structural abnormalities  Chronic kidney disease stage IIIa Baseline 1.0-1.1. Today 1.1 Tolerating po - CMP tomorrow - continue PO intake - avoid nephrotoxins  HLD LDL 125, HDL 34 - Continue home statin.  Obesity, class III - Needs to be established with PCP and encourage outpatient referral to nutrition and weight loss management  Constipation 1 BM yesterday. - Continue Miralax and Senna-S   Tobacco use disorder - Nicotine patch  Erythrocytosis, resolved Hgb 14.6, from 16. Likely in the setting of dehydration.   Troponemia, resolved Peaked at 130, then downtrended at 19. Patiend denies chest pain and no ischemic changes on EKG  Diet: regular VTE: lovenox Code: FULL PT/OT recs: outpatient PT with rollator (4 wheels). No OT follow up Family Update: Patient's husband  Dispo: Anticipated discharge to home  in 2 days pending clinical improvement   , MD Internal Medicine Resident PGY-1 Please contact the on call pager after 5 pm and on weekends at [phone].

## 2022-05-03 NOTE — Evaluation (Signed)
Occupational Therapy Evaluation Patient Details Name: Kim [name] MRN: [id number] DOB: May 07, 1961 Today's Date: 05/03/2022   History of Present Illness Pt is a 61 y/o female presenting on 7/3 with dyspnea. Admitted with acute hypoxic respiratory failure from COPD exacerbation. PMH includes: COPD, OSA, hypertension, obesity.   Clinical Impression   PTA patient independent and driving, caring for her 2 y/o granddaughter when her daughter is at work.  Pt reports using 3L O2 PRN at baseline. Admitted for above and limited by decreased activity tolerance, decreased cardiopulmonary status, and generalized weakness.  Pt engaged in limited mobility in room with supervision, ADLs with supervision.  Pt on 5L O2 during session with SpO2 desaturating to 85%, cueing for PLB and required several minutes to recover sitting.  Based on performance today, pt will benefit from further OT services acutely to optimize safety, tolerance and independence, but anticipate no further needs after dc home.      Recommendations for follow up therapy are one component of a multi-disciplinary discharge planning process, led by the attending physician.  Recommendations may be updated based on patient status, additional functional criteria and insurance authorization.   Follow Up Recommendations  No OT follow up    Assistance Recommended at Discharge PRN  Patient can return home with the following A little help with walking and/or transfers;A little help with bathing/dressing/bathroom;Assistance with cooking/housework    Functional Status Assessment  Patient has had a recent decline in their functional status and demonstrates the ability to make significant improvements in function in a reasonable and predictable amount of time.  Equipment Recommendations  BSC/3in1    Recommendations for Other Services       Precautions / Restrictions Precautions Precautions: Other (comment) Precaution Comments: watch  O2 Restrictions Weight Bearing Restrictions: No      Mobility Bed Mobility               General bed mobility comments: long sitting in bed, transitioned to/from EOB with independence    Transfers Overall transfer level: Needs assistance Equipment used: None Transfers: Sit to/from Stand Sit to Stand: Supervision           General transfer comment: line mgmt/safety      Balance Overall balance assessment: Mild deficits observed, not formally tested                                         ADL either performed or assessed with clinical judgement   ADL Overall ADL's : Needs assistance/impaired     Grooming: Supervision/safety;Standing           Upper Body Dressing : Set up;Sitting   Lower Body Dressing: Supervision/safety;Sit to/from stand   Toilet Transfer: Supervision/safety;Ambulation Toilet Transfer Details (indicate cue type and reason): simulated in room         Functional mobility during ADLs: Supervision/safety General ADL Comments: cueing for PLB, O2 desaturated on 5L to 85% with limited in room mobility requiring 1-2 minutes to recover to >90%     Vision   Vision Assessment?: No apparent visual deficits     Perception     Praxis      Pertinent Vitals/Pain Pain Assessment Pain Assessment: No/denies pain     Hand Dominance     Extremity/Trunk Assessment Upper Extremity Assessment Upper Extremity Assessment: Generalized weakness   Lower Extremity Assessment Lower Extremity Assessment: Defer to PT evaluation   Cervical /  Trunk Assessment Cervical / Trunk Assessment: Other exceptions Cervical / Trunk Exceptions: increased body habitus   Communication Communication Communication: No difficulties   Cognition Arousal/Alertness: Awake/alert Behavior During Therapy: WFL for tasks assessed/performed Overall Cognitive Status: Within Functional Limits for tasks assessed                                        General Comments       Exercises     Shoulder Instructions      Home Living Family/patient expects to be discharged to:: Private residence Living Arrangements: Children (daughter and 2 yo granddaughter) Available Help at Discharge: Family;Available PRN/intermittently Type of Home: House Home Access: Stairs to enter [employer] of Steps: 4 back, 6 front Entrance Stairs-Rails: Can reach both Home Layout: One level     Bathroom Shower/Tub: Chief Strategy Officer: Standard     Home Equipment: None   Additional Comments: reports having 3L O2 at home but only wearning it PRN      Prior Functioning/Environment Prior Level of Function : Independent/Modified Independent;Driving               ADLs Comments: reports caring for her 2y/o granddaughter while her daughter works, independent IADLs (son comes over to assist with yardwork)        OT Problem List: Decreased activity tolerance;Decreased knowledge of use of DME or AE;Decreased knowledge of precautions;Decreased safety awareness;Cardiopulmonary status limiting activity;Obesity      OT Treatment/Interventions: Self-care/ADL training;Energy conservation;DME and/or AE instruction;Therapeutic activities;Patient/family education    OT Goals(Current goals can be found in the care plan section) Acute Rehab OT Goals Patient Stated Goal: home OT Goal Formulation: With patient Time For Goal Achievement: 05/17/22 Potential to Achieve Goals: Good  OT Frequency: Min 2X/week    Co-evaluation              AM-PAC OT "6 Clicks" Daily Activity     Outcome Measure Help from another person eating meals?: None Help from another person taking care of personal grooming?: A Little Help from another person toileting, which includes using toliet, bedpan, or urinal?: A Little Help from another person bathing (including washing, rinsing, drying)?: A Little Help from another person to put on and  taking off regular upper body clothing?: A Little Help from another person to put on and taking off regular lower body clothing?: A Little 6 Click Score: 19   End of Session Equipment Utilized During Treatment: Oxygen (5L) Nurse Communication: Mobility status  Activity Tolerance: Patient tolerated treatment well Patient left: in bed;with call bell/phone within reach;with nursing/sitter in room  OT Visit Diagnosis: Other abnormalities of gait and mobility (R26.89);Muscle weakness (generalized) (M62.81)                Time: 7062-3762 OT Time Calculation (min): 15 min Charges:  OT General Charges $OT Visit: 1 Visit OT Evaluation $OT Eval Low Complexity: 1 Low  Barry Brunner, OT Acute Rehabilitation Services Office [phone]   Chancy Milroy 05/03/2022, 9:11 AM

## 2022-05-04 ENCOUNTER — Inpatient Hospital Stay (HOSPITAL_COMMUNITY): Payer: Medicaid Other

## 2022-05-04 DIAGNOSIS — J9601 Acute respiratory failure with hypoxia: Secondary | ICD-10-CM | POA: Diagnosis not present

## 2022-05-04 DIAGNOSIS — R079 Chest pain, unspecified: Secondary | ICD-10-CM | POA: Diagnosis not present

## 2022-05-04 DIAGNOSIS — J441 Chronic obstructive pulmonary disease with (acute) exacerbation: Secondary | ICD-10-CM | POA: Diagnosis not present

## 2022-05-04 DIAGNOSIS — F17210 Nicotine dependence, cigarettes, uncomplicated: Secondary | ICD-10-CM | POA: Diagnosis not present

## 2022-05-04 DIAGNOSIS — G4733 Obstructive sleep apnea (adult) (pediatric): Secondary | ICD-10-CM | POA: Diagnosis not present

## 2022-05-04 LAB — CBC
HCT: 46.1 % — ABNORMAL HIGH (ref 36.0–46.0)
Hemoglobin: 13.9 g/dL (ref 12.0–15.0)
MCH: 27.3 pg (ref 26.0–34.0)
MCHC: 30.2 g/dL (ref 30.0–36.0)
MCV: 90.4 fL (ref 80.0–100.0)
Platelets: 219 10*3/uL (ref 150–400)
RBC: 5.1 MIL/uL (ref 3.87–5.11)
RDW: 19.4 % — ABNORMAL HIGH (ref 11.5–15.5)
WBC: 15 10*3/uL — ABNORMAL HIGH (ref 4.0–10.5)
nRBC: 0 % (ref 0.0–0.2)

## 2022-05-04 LAB — BLOOD CULTURE ID PANEL (REFLEXED) - BCID2

## 2022-05-04 LAB — COMPREHENSIVE METABOLIC PANEL
ALT: 41 U/L (ref 0–44)
AST: 54 U/L — ABNORMAL HIGH (ref 15–41)
Albumin: 2.7 g/dL — ABNORMAL LOW (ref 3.5–5.0)
Alkaline Phosphatase: 70 U/L (ref 38–126)
Anion gap: 11 (ref 5–15)
BUN: 20 mg/dL (ref 8–23)
CO2: 30 mmol/L (ref 22–32)
Calcium: 9.3 mg/dL (ref 8.9–10.3)
Chloride: 100 mmol/L (ref 98–111)
Creatinine, Ser: 1.24 mg/dL — ABNORMAL HIGH (ref 0.44–1.00)
GFR, Estimated: 50 mL/min — ABNORMAL LOW (ref >60)
Glucose, Bld: 125 mg/dL — ABNORMAL HIGH (ref 70–99)
Potassium: 4.7 mmol/L (ref 3.5–5.1)
Sodium: 141 mmol/L (ref 135–145)
Total Bilirubin: 0.5 mg/dL (ref 0.3–1.2)
Total Protein: 6.7 g/dL (ref 6.5–8.1)

## 2022-05-04 LAB — BLOOD GAS, VENOUS
Acid-Base Excess: 6 mmol/L — ABNORMAL HIGH (ref 0.0–2.0)
Bicarbonate: 34.9 mmol/L — ABNORMAL HIGH (ref 20.0–28.0)
Drawn by: 3409
O2 Saturation: 78.3 %
Patient temperature: 37
pCO2, Ven: 71 mmHg (ref 44–60)
pH, Ven: 7.3 (ref 7.25–7.43)
pO2, Ven: 47 mmHg — ABNORMAL HIGH (ref 32–45)

## 2022-05-04 LAB — ECHOCARDIOGRAM COMPLETE
AR max vel: 1.82 cm2
AV Peak grad: 10 mmHg
Ao pk vel: 1.58 m/s
Area-P 1/2: 3.48 cm2
Calc EF: 64.5 %
Height: 65 in
S' Lateral: 2.6 cm
Single Plane A2C EF: 65.3 %
Single Plane A4C EF: 62.3 %
Weight: 3841.3 oz

## 2022-05-04 LAB — LEGIONELLA PNEUMOPHILA SEROGP 1 UR AG: L. pneumophila Serogp 1 Ur Ag: NEGATIVE

## 2022-05-04 MED ORDER — ACETAMINOPHEN 500 MG PO TABS
1000.0000 mg | ORAL_TABLET | Freq: Three times a day (TID) | ORAL | Status: DC | PRN
  Administered 2022-05-05 – 2022-05-07 (×3): 500 mg via ORAL
  Administered 2022-05-08 – 2022-05-09 (×3): 1000 mg via ORAL
  Filled 2022-05-04 (×5): qty 2

## 2022-05-04 MED ORDER — SODIUM CHLORIDE 0.9 % IV SOLN
1.0000 g | Freq: Three times a day (TID) | INTRAVENOUS | Status: DC
  Administered 2022-05-04 – 2022-05-08 (×11): 1 g via INTRAVENOUS
  Filled 2022-05-04 (×12): qty 20

## 2022-05-04 MED ORDER — IPRATROPIUM-ALBUTEROL 0.5-2.5 (3) MG/3ML IN SOLN
3.0000 mL | Freq: Two times a day (BID) | RESPIRATORY_TRACT | Status: DC
Start: 2022-05-04 — End: 2022-05-07
  Administered 2022-05-04 – 2022-05-07 (×6): 3 mL via RESPIRATORY_TRACT
  Filled 2022-05-04 (×6): qty 3

## 2022-05-04 NOTE — Progress Notes (Signed)
  Transition of Care [facility]) Screening Note   Patient Details  Name: Kim [name] Date of Birth: 1961/09/03   Transition of Care [facility]) CM/SW Contact:    Harriet Masson, RN Phone Number: 05/04/2022, 8:54 AM    Transition of Care Department [facility]) has reviewed patient and no TOC needs have been identified at this time. We will continue to monitor patient advancement through interdisciplinary progression rounds. If new patient transition needs arise, please place a TOC consult.

## 2022-05-04 NOTE — Progress Notes (Signed)
PHARMACY - PHYSICIAN COMMUNICATION CRITICAL VALUE ALERT - BLOOD CULTURE IDENTIFICATION (BCID)  Assessment:   Kim [name] is an 61 y.o. female who presented to [facility] on 05/02/2022 with a chief complaint of feeling unwell, sob- treated for COPD exacerbation Noted to have ESBL Ecoli bacteremia wbc 17 Tm 97 cr 1.2 crcl aprrox 64ml/min   Name of physician (or Provider) Contacted: Dr Burnice Logan  Changes to prescribed antibiotics recommended:  Changed ceftriaxone to meropenem 1gm q8h   Results for orders placed or performed during the hospital encounter of 05/02/22  Blood Culture ID Panel (Reflexed) (Collected: 05/02/2022  7:37 AM)  Result Value Ref Range   Enterococcus faecalis NOT DETECTED NOT DETECTED   Enterococcus Faecium NOT DETECTED NOT DETECTED   Listeria monocytogenes NOT DETECTED NOT DETECTED   Staphylococcus species NOT DETECTED NOT DETECTED   Staphylococcus aureus (BCID) NOT DETECTED NOT DETECTED   Staphylococcus epidermidis NOT DETECTED NOT DETECTED   Staphylococcus lugdunensis NOT DETECTED NOT DETECTED   Streptococcus species NOT DETECTED NOT DETECTED   Streptococcus agalactiae NOT DETECTED NOT DETECTED   Streptococcus pneumoniae NOT DETECTED NOT DETECTED   Streptococcus pyogenes NOT DETECTED NOT DETECTED   A.calcoaceticus-baumannii NOT DETECTED NOT DETECTED   Bacteroides fragilis NOT DETECTED NOT DETECTED   Enterobacterales DETECTED (A) NOT DETECTED   Enterobacter cloacae complex NOT DETECTED NOT DETECTED   Escherichia coli DETECTED (A) NOT DETECTED   Klebsiella aerogenes NOT DETECTED NOT DETECTED   Klebsiella oxytoca NOT DETECTED NOT DETECTED   Klebsiella pneumoniae NOT DETECTED NOT DETECTED   Proteus species NOT DETECTED NOT DETECTED   Salmonella species NOT DETECTED NOT DETECTED   Serratia marcescens NOT DETECTED NOT DETECTED   Haemophilus influenzae NOT DETECTED NOT DETECTED   Neisseria meningitidis NOT DETECTED NOT DETECTED   Pseudomonas aeruginosa NOT DETECTED  NOT DETECTED   Stenotrophomonas maltophilia NOT DETECTED NOT DETECTED   Candida albicans NOT DETECTED NOT DETECTED   Candida auris NOT DETECTED NOT DETECTED   Candida glabrata NOT DETECTED NOT DETECTED   Candida krusei NOT DETECTED NOT DETECTED   Candida parapsilosis NOT DETECTED NOT DETECTED   Candida tropicalis NOT DETECTED NOT DETECTED   Cryptococcus neoformans/gattii NOT DETECTED NOT DETECTED   CTX-M ESBL DETECTED (A) NOT DETECTED   Carbapenem resistance IMP NOT DETECTED NOT DETECTED   Carbapenem resistance KPC NOT DETECTED NOT DETECTED   Carbapenem resistance NDM NOT DETECTED NOT DETECTED   Carbapenem resist OXA 48 LIKE NOT DETECTED NOT DETECTED   Carbapenem resistance VIM NOT DETECTED NOT DETECTED    Leota Sauers Pharm.D. CPP, BCPS Clinical Pharmacist [phone] 05/04/2022 8:44 PM

## 2022-05-04 NOTE — Progress Notes (Signed)
Patient tolerated BiPAP for approximately .  12/6 5L.  Patient looked very comfortable however she insisted taking it off.  RT reiterated how important it is to wear the BiPAP.  RT explained for BiPAP to work patient should wear at least 4 hours.  Ultimately patient placed back on 4L [place].  RT will continue to monitor.

## 2022-05-04 NOTE — TOC Initial Note (Signed)
Transition of Care [facility]) - Initial/Assessment Note    Patient Details  Name: Kim [name] MRN: [id number] Date of Birth: Jul 25, 1961  Transition of Care [facility]) CM/SW Contact:    Harriet Masson, RN Phone Number: 05/04/2022, 3:23 PM  Clinical Narrative:                 Spoke to patient at bedside regarding transition needs. Patient states she uses home 02 PRN but can't remember what agency provides it. Patient agreeable to use in house provider for DME. Spoke to [place] and ordered 3&1, shower chair, and rolator. DME will be delivered to the room prior to discharge. Patient agreeable to go to outpt rehab. [facility] [address] Husband can transport patient home. TOC will continue to follow for needs.  Expected Discharge Plan: OP Rehab Barriers to Discharge: Continued Medical Work up   Patient Goals and CMS Choice Patient states their goals for this hospitalization and ongoing recovery are:: return home      Expected Discharge Plan and Services Expected Discharge Plan: OP Rehab   Discharge Planning Services: CM Consult Post Acute Care Choice: Durable Medical Equipment Living arrangements for the past 2 months: Single Family Home                 DME Arranged: 3-N-1, Shower stool, Walker rolling with seat DME Agency: [employer] Date DME Agency Contacted: 05/04/22 Time DME Agency Contacted: [phone] Representative spoke with at DME Agency: Suzanna Obey            Prior Living Arrangements/Services Living arrangements for the past 2 months: Single Family Home Lives with:: Spouse Patient language and need for interpreter reviewed:: Yes Do you feel safe going back to the place where you live?: Yes      Need for Family Participation in Patient Care: Yes (Comment) Care giver support system in place?: Yes (comment) Current home services:  (walker, home 02) Criminal Activity/Legal Involvement Pertinent to Current Situation/Hospitalization: No - Comment as  needed  Activities of Daily Living Home Assistive Devices/Equipment: None ADL Screening (condition at time of admission) Patient's cognitive ability adequate to safely complete daily activities?: Yes Is the patient deaf or have difficulty hearing?: No Does the patient have difficulty seeing, even when wearing glasses/contacts?: No Does the patient have difficulty concentrating, remembering, or making decisions?: No Patient able to express need for assistance with ADLs?: Yes Does the patient have difficulty dressing or bathing?: No Independently performs ADLs?: Yes (appropriate for developmental age) Does the patient have difficulty walking or climbing stairs?: No Weakness of Legs: None Weakness of Arms/Hands: None  Permission Sought/Granted Permission sought to share information with : Case Manager Permission granted to share information with : Yes, Verbal Permission Granted     Permission granted to share info w AGENCY: HH        Emotional Assessment Appearance:: Appears older than stated age Attitude/Demeanor/Rapport: Engaged Affect (typically observed): Accepting Orientation: : Oriented to Self, Oriented to Place, Oriented to  Time, Oriented to Situation Alcohol / Substance Use: Not Applicable Psych Involvement: No (comment)  Admission diagnosis:  Hypoxia [R09.02] Acute hypoxemic respiratory failure (HCC) [J96.01] Sepsis with acute hypoxic respiratory failure without septic shock, due to unspecified organism (HCC) [A41.9, R65.20, J96.01] Patient Active Problem List   Diagnosis Date Noted   Acute hypoxemic respiratory failure (HCC) 05/02/2022   Erythrocytosis 05/02/2022   Obesity, Class III, BMI 40-49.9 (morbid obesity) (HCC) 05/02/2022   CKD (chronic kidney disease), stage III (HCC)  05/02/2022   OSA (obstructive sleep apnea)    Acute pulmonary edema (HCC)    Acute respiratory failure with hypoxia (HCC) 09/02/2019   COPD exacerbation (HCC) 09/02/2019   Essential  hypertension 09/02/2019   HLD (hyperlipidemia) 09/02/2019   PCP:  Nechama Guard, FNP Pharmacy:   [id number] - [place], [place] - [address] [place] [address] Phone: [phone] Fax: [phone]     Social Determinants of Health (SDOH) Interventions    Readmission Risk Interventions     No data to display

## 2022-05-04 NOTE — Progress Notes (Addendum)
Hospital day#2 Subjective:   Overnight Events: Refused BiPAP overnight  Breathing better. Patient was not able to sleep last night because the alerts in the room. Reports tingling on bilateral feet, not new but it is bothering her. She reports having no history of DM. 1 BM today  Objective:  Vital signs in last 24 hours: Vitals:   05/04/22 0845 05/04/22 0846 05/04/22 0848 05/04/22 1242  BP:    137/71  Pulse:    83  Resp:    17  Temp:    97.8 F (36.6 C)  TempSrc:    Oral  SpO2: 95% 96% 97% (!) 88%  Weight:      Height:       Supplemental O2: 2L 88-95% saturation Physical Exam:  Constitutional: tired-appearing chronically ill woman sitting in bed in no acute distress Cardiovascular: regular rate and rhythm, no m/r/g - 12 beat run of VTACH while in the room with mild cough after episodes. Otherwise asymptomatic Pulmonary/Chest: normal work of breathing 2L O2. Decreased breath sounds in the lower lung bases, but improved from prior. No wheezing or productive cough Abdominal: soft, non-tender, non-distended MSK: normal bulk and tone. No edema appreciated in the lower extremities. Neurological: alert & oriented x 3, +sensation to touch in bilateral feet Skin: thorough head to toe skin exam performed - skin was intact without signs of ruptured skin or erythema.  Psych: Appropriate mood and affect.   Filed Weights   05/02/22 1905  Weight: 108.9 kg     Intake/Output Summary (Last 24 hours) at 05/04/2022 1508 Last data filed at 05/03/2022 2123 Gross per 24 hour  Intake 580 ml  Output 800 ml  Net -220 ml   Net IO Since Admission: 260 mL [05/04/22 1508]  Pertinent Labs:    Latest Ref Rng & Units 05/04/2022    2:04 AM 05/03/2022    1:22 AM 05/02/2022    3:31 AM  CBC  WBC 4.0 - 10.5 K/uL 15.0  17.0    Hemoglobin 12.0 - 15.0 g/dL 13.9  14.6  18.7   Hematocrit 36.0 - 46.0 % 46.1  48.6  55.0   Platelets 150 - 400 K/uL 219  225         Latest Ref Rng & Units 05/04/2022    2:04  AM 05/03/2022    1:22 AM 05/02/2022    2:07 PM  CMP  Glucose 70 - 99 mg/dL 125  140  139   BUN 8 - 23 mg/dL 20  15  11    Creatinine 0.44 - 1.00 mg/dL 1.24  1.12  1.14   Sodium 135 - 145 mmol/L 141  140  137   Potassium 3.5 - 5.1 mmol/L 4.7  3.7  4.4   Chloride 98 - 111 mmol/L 100  104  102   CO2 22 - 32 mmol/L 30  25  25    Calcium 8.9 - 10.3 mg/dL 9.3  9.0  8.5   Total Protein 6.5 - 8.1 g/dL 6.7     Total Bilirubin 0.3 - 1.2 mg/dL 0.5     Alkaline Phos 38 - 126 U/L 70     AST 15 - 41 U/L 54     ALT 0 - 44 U/L 41        Imaging: ECHOCARDIOGRAM COMPLETE  Result Date: 05/04/2022    ECHOCARDIOGRAM REPORT   Patient Name:   Kim [name] Date of Exam: 05/04/2022 Medical Rec #:  [id number]  Height:       65.0 in Accession #:    [id number]      Weight:       240.1 lb Date of Birth:  07-08-1961      BSA:          2.138 m Patient Age:    60 years        BP:           152/76 mmHg Patient Gender: F               HR:           82 bpm. Exam Location:  Inpatient Procedure: 2D Echo, Cardiac Doppler and Color Doppler Indications:    Chest pain  History:        Patient has prior history of Echocardiogram examinations. COPD;                 Risk Factors:Sleep Apnea and Hypertension.  Sonographer:    Cleatis Polka Referring Phys: [id number] GRACE LAU  Sonographer Comments: Patient is morbidly obese. Image acquisition challenging due to patient body habitus. IMPRESSIONS  1. Left ventricular ejection fraction, by estimation, is 60 to 65%. The left ventricle has normal function. The left ventricle has no regional wall motion abnormalities. There is mild concentric left ventricular hypertrophy.  2. Right ventricular systolic function is low normal. The right ventricular size is moderately enlarged. There is moderately elevated pulmonary artery systolic pressure.  3. Right atrial size was mildly dilated.  4. The mitral valve is normal in structure. No evidence of mitral valve regurgitation. No evidence of mitral stenosis.  Moderate mitral annular calcification.  5. The aortic valve is normal in structure. Aortic valve regurgitation is not visualized. Aortic valve sclerosis is present, with no evidence of aortic valve stenosis.  6. The inferior vena cava is dilated in size with >50% respiratory variability, suggesting right atrial pressure of 8 mmHg. FINDINGS  Left Ventricle: Left ventricular ejection fraction, by estimation, is 60 to 65%. The left ventricle has normal function. The left ventricle has no regional wall motion abnormalities. The left ventricular internal cavity size was normal in size. There is  mild concentric left ventricular hypertrophy. Left ventricular diastolic function could not be evaluated due to mitral annular calcification (moderate or greater). Right Ventricle: The right ventricular size is moderately enlarged. No increase in right ventricular wall thickness. Right ventricular systolic function is low normal. There is moderately elevated pulmonary artery systolic pressure. The tricuspid regurgitant velocity is 3.05 m/s, and with an assumed right atrial pressure of 8 mmHg, the estimated right ventricular systolic pressure is 45.2 mmHg. Left Atrium: Left atrial size was normal in size. Right Atrium: Right atrial size was mildly dilated. Pericardium: There is no evidence of pericardial effusion. Mitral Valve: The mitral valve is normal in structure. Moderate mitral annular calcification. No evidence of mitral valve regurgitation. No evidence of mitral valve stenosis. Tricuspid Valve: The tricuspid valve is normal in structure. Tricuspid valve regurgitation is mild . No evidence of tricuspid stenosis. Aortic Valve: The aortic valve is normal in structure. Aortic valve regurgitation is not visualized. Aortic valve sclerosis is present, with no evidence of aortic valve stenosis. Aortic valve peak gradient measures 10.0 mmHg. Pulmonic Valve: The pulmonic valve was normal in structure. Pulmonic valve regurgitation is  not visualized. No evidence of pulmonic stenosis. Aorta: The aortic root is normal in size and structure. Venous: The inferior vena cava is dilated in size with greater than 50% respiratory  variability, suggesting right atrial pressure of 8 mmHg. IAS/Shunts: No atrial level shunt detected by color flow Doppler.  LEFT VENTRICLE PLAX 2D LVIDd:         4.00 cm      Diastology LVIDs:         2.60 cm      LV e' medial:    7.62 cm/s LV PW:         1.20 cm      LV E/e' medial:  13.4 LV IVS:        1.20 cm      LV e' lateral:   9.79 cm/s LVOT diam:     1.80 cm      LV E/e' lateral: 10.4 LV SV:         57 LV SV Index:   27 LVOT Area:     2.54 cm  LV Volumes (MOD) LV vol d, MOD A2C: 106.0 ml LV vol d, MOD A4C: 71.3 ml LV vol s, MOD A2C: 36.8 ml LV vol s, MOD A4C: 26.9 ml LV SV MOD A2C:     69.2 ml LV SV MOD A4C:     71.3 ml LV SV MOD BP:      57.4 ml RIGHT VENTRICLE            IVC RV Basal diam:  4.20 cm    IVC diam: 2.40 cm RV Mid diam:    3.90 cm RV S prime:     9.79 cm/s TAPSE (M-mode): 1.8 cm LEFT ATRIUM             Index        RIGHT ATRIUM           Index LA diam:        4.10 cm 1.92 cm/m   RA Area:     21.70 cm LA Vol (A2C):   34.5 ml 16.14 ml/m  RA Volume:   75.10 ml  35.13 ml/m LA Vol (A4C):   42.8 ml 20.02 ml/m LA Biplane Vol: 42.3 ml 19.79 ml/m  AORTIC VALVE AV Area (Vmax): 1.82 cm AV Vmax:        158.00 cm/s AV Peak Grad:   10.0 mmHg LVOT Vmax:      113.00 cm/s LVOT Vmean:     80.400 cm/s LVOT VTI:       0.223 m  AORTA Ao Root diam: 2.60 cm Ao Asc diam:  3.20 cm MITRAL VALVE                TRICUSPID VALVE MV Area (PHT): 3.48 cm     TR Peak grad:   37.2 mmHg MV Decel Time: 218 msec     TR Vmax:        305.00 cm/s MV E velocity: 102.00 cm/s MV A velocity: 105.00 cm/s  SHUNTS MV E/A ratio:  0.97         Systemic VTI:  0.22 m                             Systemic Diam: 1.80 cm Kardie Tobb DO Electronically signed by Thomasene Ripple DO Signature Date/Time: 05/04/2022/11:37:19 AM    Final     Assessment/Plan:    Principal Problem:   Acute hypoxemic respiratory failure (HCC) Active Problems:   COPD exacerbation (HCC)   Essential hypertension   OSA (obstructive sleep apnea)   Erythrocytosis   Obesity, Class III, BMI 40-49.9 (morbid  obesity) ([place])   CKD (chronic kidney disease), stage III ([place])   Patient Summary: Kim [name] is a 61 y.o. with a hx of COPD, HTN, CKD3a, HLD, OSA without home BiPAP being treated for chronic hypoxic respiratory failure secondary to COPD exacerbation and leukocytosis, now improving.    COPD exacerbation Acute hypoxic respiratory failure Respiratory acidosis, probable acute on chronic ?Community acquired pneumonia OSA ECHO showed LVEF 60-65% with normal LV function. RV systolic function low normal, RV moderately enlarged with moderately elevated pulmonary artery systolic pressure. These findings most likely explained by her underlying lung disease, presentation likely due to the same, COPD exacerbation vs OSA vs viral infection. Patients leukocytosis continue to improve on antibiotic treatment despite not having identified an infection source or bacterial infection. No blood culture growth. The plan is to finish antibiotic course then follow in outpatient setting. Patient continues to be acidotic per repeat vBG today.  Patient encouraged to trial BiPAP tonight.  - Repeat vBG tomorrow - Will need outpatient PFT's on f/u - Will need outpatient sleep study - Continue: - Prednisone 40mg  daily (7/3-/7/7) - IV ceftriaxone 1g + PO azithromycin 500mg  daily - DuoNebs q4h while awake - Albuterol q4h PRN - Aformoterol nebulizer twice daily - Yupleri nebulizer daily - Budesonide nebulizer twice daily  HTN SBPs in 140-150s - Patient has not been prescribed antihypertensive medications. Patient had a VTACH 12 beat run during examination today. No LV dysfunction noted on TTE.  - Close electrolyte monitoring and repletion - Will monitor for repeat episodes  ?Peripheral  neuropathy Chronic tingling in bilateral feet without no change in sensation. No history of PAD with good distal pulses. No hx of DM, A1c 5.8% on presentation. Patient with normal TSH. Will order B12 to rule out vit deficiency otherwise workout small vessel disease. - Vitamin B12  HLD - Continue home statin  Constipation - 1BM today. - Continue Senna -S and Miralax  Tobacco use disorder -Nicotine patch  Obesity, class II - PCP follow up - Outpatient referral   Diet: regular VTE: lovenox Code: FULL PT/OT recs: outpatient PT with rollator (4 wheels). No OT follow up    Dispo: Anticipated discharge to home  in 2 days pending clinical improvement  Family Update:   Dispo: Anticipated discharge to Home in 2 days pending clinical improvement.   Romana Juniper, MD Internal Medicine Resident PGY-1 Please contact the on call pager after 5 pm and on weekends at [phone].

## 2022-05-04 NOTE — Progress Notes (Signed)
Patient agreed to a BiPAP trial (30 min) on dream staion.  Patient placed on 12/6 with 5L O2 bleed in.  Patient tolerating well at this time.  RT will continue to monitor.

## 2022-05-05 ENCOUNTER — Inpatient Hospital Stay: Payer: Self-pay

## 2022-05-05 DIAGNOSIS — J9601 Acute respiratory failure with hypoxia: Secondary | ICD-10-CM | POA: Diagnosis not present

## 2022-05-05 DIAGNOSIS — R7881 Bacteremia: Secondary | ICD-10-CM | POA: Diagnosis not present

## 2022-05-05 DIAGNOSIS — B9629 Other Escherichia coli [E. coli] as the cause of diseases classified elsewhere: Secondary | ICD-10-CM | POA: Diagnosis not present

## 2022-05-05 DIAGNOSIS — B9620 Unspecified Escherichia coli [E. coli] as the cause of diseases classified elsewhere: Secondary | ICD-10-CM

## 2022-05-05 DIAGNOSIS — Z1612 Extended spectrum beta lactamase (ESBL) resistance: Secondary | ICD-10-CM

## 2022-05-05 DIAGNOSIS — J441 Chronic obstructive pulmonary disease with (acute) exacerbation: Secondary | ICD-10-CM | POA: Diagnosis not present

## 2022-05-05 DIAGNOSIS — D72829 Elevated white blood cell count, unspecified: Secondary | ICD-10-CM | POA: Diagnosis not present

## 2022-05-05 DIAGNOSIS — A419 Sepsis, unspecified organism: Secondary | ICD-10-CM

## 2022-05-05 DIAGNOSIS — G4733 Obstructive sleep apnea (adult) (pediatric): Secondary | ICD-10-CM | POA: Diagnosis not present

## 2022-05-05 LAB — BLOOD GAS, VENOUS
Acid-Base Excess: 10 mmol/L — ABNORMAL HIGH (ref 0.0–2.0)
Bicarbonate: 38.8 mmol/L — ABNORMAL HIGH (ref 20.0–28.0)
Drawn by: 426281
O2 Saturation: 84.7 %
Patient temperature: 36.1
pCO2, Ven: 69 mmHg — ABNORMAL HIGH (ref 44–60)
pH, Ven: 7.35 (ref 7.25–7.43)
pO2, Ven: 51 mmHg — ABNORMAL HIGH (ref 32–45)

## 2022-05-05 LAB — BASIC METABOLIC PANEL
Anion gap: 6 (ref 5–15)
BUN: 20 mg/dL (ref 8–23)
CO2: 29 mmol/L (ref 22–32)
Calcium: 9.3 mg/dL (ref 8.9–10.3)
Chloride: 103 mmol/L (ref 98–111)
Creatinine, Ser: 1.02 mg/dL — ABNORMAL HIGH (ref 0.44–1.00)
GFR, Estimated: >60 mL/min (ref >60)
Glucose, Bld: 87 mg/dL (ref 70–99)
Potassium: 4.4 mmol/L (ref 3.5–5.1)
Sodium: 138 mmol/L (ref 135–145)

## 2022-05-05 LAB — ERYTHROPOIETIN: Erythropoietin: 3.9 m[IU]/mL (ref 2.6–18.5)

## 2022-05-05 LAB — CBC
HCT: 45.9 % (ref 36.0–46.0)
Hemoglobin: 13.5 g/dL (ref 12.0–15.0)
MCH: 26.7 pg (ref 26.0–34.0)
MCHC: 29.4 g/dL — ABNORMAL LOW (ref 30.0–36.0)
MCV: 90.9 fL (ref 80.0–100.0)
Platelets: 224 10*3/uL (ref 150–400)
RBC: 5.05 MIL/uL (ref 3.87–5.11)
RDW: 19.1 % — ABNORMAL HIGH (ref 11.5–15.5)
WBC: 11.9 10*3/uL — ABNORMAL HIGH (ref 4.0–10.5)
nRBC: 0 % (ref 0.0–0.2)

## 2022-05-05 LAB — VITAMIN B12: Vitamin B-12: 367 pg/mL (ref 180–914)

## 2022-05-05 LAB — MAGNESIUM: Magnesium: 2.1 mg/dL (ref 1.7–2.4)

## 2022-05-05 NOTE — Consult Note (Signed)
[facility]  Date of Admission:  05/02/2022   Total days of inpatient antibiotics 2 Reason for consult: ESBL Ecoli bacteremia  Principal Problem:   Acute hypoxemic respiratory failure (HCC) Active Problems:   COPD exacerbation (HCC)   Essential hypertension   OSA (obstructive sleep apnea)   Erythrocytosis   Obesity, Class III, BMI 40-49.9 (morbid obesity) (HCC)   CKD (chronic kidney disease), stage III [facility])          Assessment: 61 year old Kim [name] with COPD admitted for acute respiratory failure 2/2 COPD exacerbation.  On admission she had a temp of 101.9 and WBC 19 K.  She was started on ceftriaxone and azithromycin for COPD exacerbation.  ID engaged as she was found to have ESBL E. coli bacteremia.  #ESBL Ecoli bacteremia 2/2 likely intraabdominal source #Sepsis -Pt reports abdominal pain x 1.5 weeks. Denies diarrhea, dysuria. She continues to have ome abdominal pain Work-up: -CT chest abdomen pelvis showed sigmoid diverticulosis with mild asymmetric wall thickening at sigmoid colon.  -Blood Cx+ ESBL Ecoli on 7/3. UA on 7/3(prior to antibiotics)showed negative nitrites/leukocytes-benign.  Recommendations: -Continue meropenem -Follow-up Ecoli sensitivities. Hold off on PICC till sens result.  -Plan on about 10 days total of antibiotics for diverticulitis/bacteremia.    Microbiology:   Antibiotics: Ceftriaxone 7/3-5 Azithromycin 7/3-7/5 Merrem 7/5-p Cultures: Blood 7/3 2/2 ESBL Ecoli     HPI: Kim Kim [name] with chronic has positive respiratory failure secondary to COPD on 3 L O2 at home, hypertension, hyperlipidemia, obesity, CKD stage III presented with chills and dyspnea.  Admitted for acute respiratory failure secondary to COPD exacerbation.  She was feeling unwell for 2 days prior to admission with worsening dyspnea.  Noted to have increased cough.  Admitted for COPD exacerbation she was requiring 5 L O2.  Also felt to have WBC 19 K on  admission with temp along 101.9.  She was started on ceftriaxone and azithromycin.  CT chest abdomen pelvis showed diverticulosis with a slight wall thickening of sigmoid.  Blood cultures returned positive for ESBL E. Coli. Infectious disease for antibiotic recommendations. Today, patient reports that.  She does report that the pain is improving.  Denies diarrhea, nausea, vomiting.  Review of Systems: Review of Systems  All other systems reviewed and are negative.    Scheduled Meds:  alum & mag hydroxide-simeth  15 mL Oral Once   arformoterol  15 mcg Nebulization BID   budesonide (PULMICORT) nebulizer solution  0.25 mg Nebulization BID   enoxaparin (LOVENOX) injection  40 mg Subcutaneous Q24H   ipratropium-albuterol  3 mL Nebulization BID   nicotine  14 mg Transdermal Daily   polyethylene glycol  17 g Oral Daily   pravastatin  20 mg Oral q1800   predniSONE  40 mg Oral Q breakfast   revefenacin  175 mcg Nebulization Daily   Continuous Infusions:  meropenem (MERREM) IV 1 g (05/05/22 0500)   PRN Meds:.acetaminophen, albuterol, senna-docusate No Known Allergies  OBJECTIVE: Vitals:   05/05/22 0754 05/05/22 0755 05/05/22 0819 05/05/22 1250  BP:   135/71 (!) 174/84  Pulse:   85 83  Resp:   20 20  Temp:   97.9 F (36.6 C) 98.2 F (36.8 C)  TempSrc:   Oral Oral  SpO2: 98% 98% 93% 90%  Weight:      Height:       Body mass index is 39.95 kg/m.  Physical Exam Constitutional:      Appearance: Normal  appearance.  HENT:     Head: Normocephalic and atraumatic.     Right Ear: Tympanic membrane normal.     Left Ear: Tympanic membrane normal.     Nose: Nose normal.     Mouth/Throat:     Mouth: Mucous membranes are moist.  Eyes:     Extraocular Movements: Extraocular movements intact.     Conjunctiva/sclera: Conjunctivae normal.     Pupils: Pupils are equal, round, and reactive to light.  Cardiovascular:     Rate and Rhythm: Normal rate and regular rhythm.     Heart sounds: No  murmur heard.    No friction rub. No gallop.  Pulmonary:     Effort: Pulmonary effort is normal.     Breath sounds: Normal breath sounds.  Abdominal:     General: Abdomen is flat.     Palpations: Abdomen is soft.  Musculoskeletal:        General: Normal range of motion.  Skin:    General: Skin is warm and dry.  Neurological:     General: No focal deficit present.     Mental Status: She is alert and oriented to person, place, and time.  Psychiatric:        Mood and Affect: Mood normal.       Lab Results Lab Results  Component Value Date   WBC 11.9 (H) 05/05/2022   HGB 13.5 05/05/2022   HCT 45.9 05/05/2022   MCV 90.9 05/05/2022   PLT 224 05/05/2022    Lab Results  Component Value Date   CREATININE 1.02 (H) 05/05/2022   BUN 20 05/05/2022   NA 138 05/05/2022   K 4.4 05/05/2022   CL 103 05/05/2022   CO2 29 05/05/2022    Lab Results  Component Value Date   ALT 41 05/04/2022   AST 54 (H) 05/04/2022   ALKPHOS 70 05/04/2022   BILITOT 0.5 05/04/2022        Danelle Earthly, MD [facility] 05/05/2022, 1:25 PM

## 2022-05-05 NOTE — TOC Progression Note (Signed)
Transition of Care [facility]) - Progression Note    Patient Details  Name: Kim [name] MRN: [id number] Date of Birth: 12-09-1960  [facility]) CM/SW Contact  Harriet Masson, RN Phone Number: 05/05/2022, 11:30 AM  Clinical Narrative:    Patient is agreeable for CMA to make new PCP apt. CMA will place apt time on AVS.   Expected Discharge Plan: OP Rehab Barriers to Discharge: Continued Medical Work up  Expected Discharge Plan and Services Expected Discharge Plan: OP Rehab   Discharge Planning Services: CM Consult Post Acute Care Choice: Durable Medical Equipment Living arrangements for the past 2 months: Single Family Home                 DME Arranged: 3-N-1, Shower stool, Walker rolling with seat DME Agency: [employer] Date DME Agency Contacted: 05/04/22 Time DME Agency Contacted: [phone] Representative spoke with at DME Agency: [place]             Social Determinants of Health (SDOH) Interventions    Readmission Risk Interventions     No data to display

## 2022-05-05 NOTE — Progress Notes (Signed)
Physical Therapy Treatment Patient Details Name: Kim [name] MRN: [id number] DOB: [birth year] Today's Date: 05/05/2022   History of Present Illness Pt is a 61 y/o female presenting on 7/3 with dyspnea. Admitted with acute hypoxic respiratory failure from COPD exacerbation. PMH includes: COPD, OSA, hypertension, obesity.    PT Comments    Pt admitted with above diagnosis. Pt was able to progress ambulation with rollator and incr distance.  Pt with good safety with rollator and is still needing 6LO2 with activity and 3L at rest.  Pt currently with functional limitations due to balance and endurance deficits. Pt will benefit from skilled PT to increase their independence and safety with mobility to allow discharge to the venue listed below.      Recommendations for follow up therapy are one component of a multi-disciplinary discharge planning process, led by the attending physician.  Recommendations may be updated based on patient status, additional functional criteria and insurance authorization.  Follow Up Recommendations  Outpatient PT     Assistance Recommended at Discharge PRN  Patient can return home with the following A little help with bathing/dressing/bathroom;Assistance with cooking/housework   Equipment Recommendations  Rollator (4 wheels);BSC/3in1    Recommendations for Other Services       Precautions / Restrictions Precautions Precautions: None Precaution Comments: watch O2 Restrictions Weight Bearing Restrictions: No     Mobility  Bed Mobility Overal bed mobility: Independent                  Transfers Overall transfer level: Modified independent Equipment used: None Transfers: Sit to/from Stand Sit to Stand: Modified independent (Device/Increase time)           General transfer comment: line mgmt/safety    Ambulation/Gait Ambulation/Gait assistance: Supervision, Min guard (Monitor O2 sats and symptoms) Gait Distance (Feet): 150  Feet Assistive device: Rollator (4 wheels) Gait Pattern/deviations: Step-through pattern, Decreased step length - right, Decreased step length - left, Decreased stride length Gait velocity: decreased Gait velocity interpretation: 1.31 - 2.62 ft/sec, indicative of limited community ambulator   General Gait Details: Pt took a few steps without rollator and stumbled.  Obtained rollator.  Pt begins ambulation on 3L O2 but desats into mid 80s; Pt takes standing rest break and O2 increased to 6L to maintain O2 above 88%.  Cues for safety with rollator and educated pt on how to lock brakes and unlock brakes as well as to sit and rest.  adjusted the rollator as well.  Did note some knee instability bil at times.   Stairs             Wheelchair Mobility    Modified Rankin (Stroke Patients Only)       Balance Overall balance assessment: Needs assistance Sitting-balance support: No upper extremity supported, Feet supported Sitting balance-Leahy Scale: Good     Standing balance support: No upper extremity supported, During functional activity Standing balance-Leahy Scale: Fair Standing balance comment: can stand statically without rollator but dynamically does better with bil UE support                            Cognition Arousal/Alertness: Awake/alert Behavior During Therapy: WFL for tasks assessed/performed Overall Cognitive Status: Within Functional Limits for tasks assessed  Exercises      General Comments        Pertinent Vitals/Pain Pain Assessment Pain Assessment: No/denies pain    Home Living                          Prior Function            PT Goals (current goals can now be found in the care plan section) Acute Rehab PT Goals Patient Stated Goal: Return home Progress towards PT goals: Progressing toward goals    Frequency    Min 3X/week      PT Plan Current plan  remains appropriate    Co-evaluation              AM-PAC PT "6 Clicks" Mobility   Outcome Measure  Help needed turning from your back to your side while in a flat bed without using bedrails?: None Help needed moving from lying on your back to sitting on the side of a flat bed without using bedrails?: None Help needed moving to and from a bed to a chair (including a wheelchair)?: None Help needed standing up from a chair using your arms (e.g., wheelchair or bedside chair)?: None Help needed to walk in hospital room?: A Little Help needed climbing 3-5 steps with a railing? : A Little 6 Click Score: 22    End of Session Equipment Utilized During Treatment: Oxygen;Gait belt Activity Tolerance: Patient tolerated treatment well Patient left: in bed;with call bell/phone within reach;with family/visitor present (sitting EOB) Nurse Communication: Mobility status PT Visit Diagnosis: Other abnormalities of gait and mobility (R26.89);Muscle weakness (generalized) (M62.81)     Time: 9678-9381 PT Time Calculation (min) (ACUTE ONLY): 16 min  Charges:  $Gait Training: 8-22 mins                     [name] M,PT Acute Rehab Services [phone]    Bevelyn Buckles 05/05/2022, 1:43 PM

## 2022-05-05 NOTE — TOC Progression Note (Signed)
Transition of Care [facility]) - Progression Note    Patient Details  Name: Kim [name] MRN: [id number] Date of Birth: 1961/01/13  [facility]) CM/SW Contact  Harriet Masson, RN Phone Number: 05/05/2022, 4:55 PM  Clinical Narrative:     Received phone call from Dr. Phill Mutter regarding possible home iv abx. Pam with [employer] notified. RNCM will reach back out to [facility] once final decision has been made. TOC to continue to follow for needs.  Expected Discharge Plan: OP Rehab Barriers to Discharge: Continued Medical Work up  Expected Discharge Plan and Services Expected Discharge Plan: OP Rehab   Discharge Planning Services: CM Consult Post Acute Care Choice: Durable Medical Equipment Living arrangements for the past 2 months: Single Family Home                 DME Arranged: 3-N-1, Shower stool, Walker rolling with seat DME Agency: [employer] Date DME Agency Contacted: 05/04/22 Time DME Agency Contacted: [phone] Representative spoke with at DME Agency: [place]             Social Determinants of Health (SDOH) Interventions    Readmission Risk Interventions     No data to display

## 2022-05-05 NOTE — Progress Notes (Addendum)
Hospital day#3 Subjective:   Overnight Events: Tolerated BiPAP for 30 min at 11PM  Feels tired this morning, did not get a lot of sleep. Does not know if she wants to use BiPAP for longer. Patient reiterated that she had been having some belly pain on admission but this improved with a BM during hospitalization. Patient denies new shortness of breath, fever, chills, or abdominal pain.   Objective:  Vital signs in last 24 hours: Vitals:   05/04/22 1952 05/04/22 2019 05/04/22 2304 05/05/22 0335  BP:  (!) 173/81  (!) 172/84  Pulse:  87 62 76  Resp:  16  16  Temp:  (!) 97.5 F (36.4 C)  97.6 F (36.4 C)  TempSrc:  Oral  Oral  SpO2: 96% 93% 95% 94%  Weight:      Height:       Supplemental O2:  SpO2: 94 % O2 Flow Rate (L/min): 4 L/min   Physical Exam:  Constitutional: chronically ill woman sitting in bed with no distress Cardiovascular: regular rate and rhythm, no m/r/g. No VTACH runs on tele. Pulmonary/Chest: Patient on 2L O2, No wheezing or productive cough. Lower lung bases with decreased aeration, unchaged from prior Abdominal: soft, non-tender, non-distended MSK: normal bulk and tone Neurological: alert & oriented x 3, 5/5 strength in bilateral upper and lower extremities, normal gait Skin: warm and dry Psych: appropriate mood and affect  Filed Weights   05/02/22 1905  Weight: 108.9 kg    No intake or output data in the 24 hours ending 05/05/22 0554 Net IO Since Admission: 260 mL [05/05/22 0554]  Pertinent Labs:    Latest Ref Rng & Units 05/05/2022    1:53 AM 05/04/2022    2:04 AM 05/03/2022    1:22 AM  CBC  WBC 4.0 - 10.5 K/uL 11.9  15.0  17.0   Hemoglobin 12.0 - 15.0 g/dL 13.5  13.9  14.6   Hematocrit 36.0 - 46.0 % 45.9  46.1  48.6   Platelets 150 - 400 K/uL 224  219  225        Latest Ref Rng & Units 05/05/2022    1:53 AM 05/04/2022    2:04 AM 05/03/2022    1:22 AM  CMP  Glucose 70 - 99 mg/dL 87  125  140   BUN 8 - 23 mg/dL 20  20  15    Creatinine 0.44 -  1.00 mg/dL 1.02  1.24  1.12   Sodium 135 - 145 mmol/L 138  141  140   Potassium 3.5 - 5.1 mmol/L 4.4  4.7  3.7   Chloride 98 - 111 mmol/L 103  100  104   CO2 22 - 32 mmol/L 29  30  25    Calcium 8.9 - 10.3 mg/dL 9.3  9.3  9.0   Total Protein 6.5 - 8.1 g/dL  6.7    Total Bilirubin 0.3 - 1.2 mg/dL  0.5    Alkaline Phos 38 - 126 U/L  70    AST 15 - 41 U/L  54    ALT 0 - 44 U/L  41     7/3 blood culture:  Imaging: ECHOCARDIOGRAM COMPLETE  Result Date: 05/04/2022    ECHOCARDIOGRAM REPORT   Patient Name:   Kim [name] Date of Exam: 05/04/2022 Medical Rec #:  [id number]       Height:       65.0 in Accession #:    [id number]      Weight:  240.1 lb Date of Birth:  01-Dec-1960      BSA:          2.138 m Patient Age:    61 years        BP:           152/76 mmHg Patient Gender: F               HR:           82 bpm. Exam Location:  Inpatient Procedure: 2D Echo, Cardiac Doppler and Color Doppler Indications:    Chest pain  History:        Patient has prior history of Echocardiogram examinations. COPD;                 Risk Factors:Sleep Apnea and Hypertension.  Sonographer:    Jyl Heinz Referring Phys: [id number] GRACE LAU  Sonographer Comments: Patient is morbidly obese. Image acquisition challenging due to patient body habitus. IMPRESSIONS  1. Left ventricular ejection fraction, by estimation, is 60 to 65%. The left ventricle has normal function. The left ventricle has no regional wall motion abnormalities. There is mild concentric left ventricular hypertrophy.  2. Right ventricular systolic function is low normal. The right ventricular size is moderately enlarged. There is moderately elevated pulmonary artery systolic pressure.  3. Right atrial size was mildly dilated.  4. The mitral valve is normal in structure. No evidence of mitral valve regurgitation. No evidence of mitral stenosis. Moderate mitral annular calcification.  5. The aortic valve is normal in structure. Aortic valve regurgitation is not  visualized. Aortic valve sclerosis is present, with no evidence of aortic valve stenosis.  6. The inferior vena cava is dilated in size with >50% respiratory variability, suggesting right atrial pressure of 8 mmHg. FINDINGS  Left Ventricle: Left ventricular ejection fraction, by estimation, is 60 to 65%. The left ventricle has normal function. The left ventricle has no regional wall motion abnormalities. The left ventricular internal cavity size was normal in size. There is  mild concentric left ventricular hypertrophy. Left ventricular diastolic function could not be evaluated due to mitral annular calcification (moderate or greater). Right Ventricle: The right ventricular size is moderately enlarged. No increase in right ventricular wall thickness. Right ventricular systolic function is low normal. There is moderately elevated pulmonary artery systolic pressure. The tricuspid regurgitant velocity is 3.05 m/s, and with an assumed right atrial pressure of 8 mmHg, the estimated right ventricular systolic pressure is 123456 mmHg. Left Atrium: Left atrial size was normal in size. Right Atrium: Right atrial size was mildly dilated. Pericardium: There is no evidence of pericardial effusion. Mitral Valve: The mitral valve is normal in structure. Moderate mitral annular calcification. No evidence of mitral valve regurgitation. No evidence of mitral valve stenosis. Tricuspid Valve: The tricuspid valve is normal in structure. Tricuspid valve regurgitation is mild . No evidence of tricuspid stenosis. Aortic Valve: The aortic valve is normal in structure. Aortic valve regurgitation is not visualized. Aortic valve sclerosis is present, with no evidence of aortic valve stenosis. Aortic valve peak gradient measures 10.0 mmHg. Pulmonic Valve: The pulmonic valve was normal in structure. Pulmonic valve regurgitation is not visualized. No evidence of pulmonic stenosis. Aorta: The aortic root is normal in size and structure. Venous: The  inferior vena cava is dilated in size with greater than 50% respiratory variability, suggesting right atrial pressure of 8 mmHg. IAS/Shunts: No atrial level shunt detected by color flow Doppler.  LEFT VENTRICLE PLAX 2D LVIDd:  4.00 cm      Diastology LVIDs:         2.60 cm      LV e' medial:    7.62 cm/s LV PW:         1.20 cm      LV E/e' medial:  13.4 LV IVS:        1.20 cm      LV e' lateral:   9.79 cm/s LVOT diam:     1.80 cm      LV E/e' lateral: 10.4 LV SV:         57 LV SV Index:   27 LVOT Area:     2.54 cm  LV Volumes (MOD) LV vol d, MOD A2C: 106.0 ml LV vol d, MOD A4C: 71.3 ml LV vol s, MOD A2C: 36.8 ml LV vol s, MOD A4C: 26.9 ml LV SV MOD A2C:     69.2 ml LV SV MOD A4C:     71.3 ml LV SV MOD BP:      57.4 ml RIGHT VENTRICLE            IVC RV Basal diam:  4.20 cm    IVC diam: 2.40 cm RV Mid diam:    3.90 cm RV S prime:     9.79 cm/s TAPSE (M-mode): 1.8 cm LEFT ATRIUM             Index        RIGHT ATRIUM           Index LA diam:        4.10 cm 1.92 cm/m   RA Area:     21.70 cm LA Vol (A2C):   34.5 ml 16.14 ml/m  RA Volume:   75.10 ml  35.13 ml/m LA Vol (A4C):   42.8 ml 20.02 ml/m LA Biplane Vol: 42.3 ml 19.79 ml/m  AORTIC VALVE AV Area (Vmax): 1.82 cm AV Vmax:        158.00 cm/s AV Peak Grad:   10.0 mmHg LVOT Vmax:      113.00 cm/s LVOT Vmean:     80.400 cm/s LVOT VTI:       0.223 m  AORTA Ao Root diam: 2.60 cm Ao Asc diam:  3.20 cm MITRAL VALVE                TRICUSPID VALVE MV Area (PHT): 3.48 cm     TR Peak grad:   37.2 mmHg MV Decel Time: 218 msec     TR Vmax:        305.00 cm/s MV E velocity: 102.00 cm/s MV A velocity: 105.00 cm/s  SHUNTS MV E/A ratio:  0.97         Systemic VTI:  0.22 m                             Systemic Diam: 1.80 cm Kardie Tobb DO Electronically signed by Thomasene Ripple DO Signature Date/Time: 05/04/2022/11:37:19 AM    Final     Assessment/Plan:   Principal Problem:   Acute hypoxemic respiratory failure (HCC) Active Problems:   COPD exacerbation (HCC)    Essential hypertension   OSA (obstructive sleep apnea)   Erythrocytosis   Obesity, Class III, BMI 40-49.9 (morbid obesity) (HCC)   CKD (chronic kidney disease), stage III (HCC)   Patient Summary:  Kim [name] is a 61 yo woman with a hx of COPD, HTN, CKD3a, HLD, OSA  without home BiPAP admitted with acute hypoxic respiratory failure secondary to COPD exacerbation, now being treated for COPD exacerbationg, improving and new ESBL, E coli bacteremia, awaiting sensitivities for discharge   COPD exacerbation Acute hypoxic respiratory failure Respiratory acidosis  OSA Tolerated 30 min of BiPAP on 7/6. vBG today, not improved from prior, likely chronic. Echo showed normal LVEF 60-65% with normal LV function. RV systolic funcion low normal, and RV moderately enlarged with elevated pulmonary artery systolic pressure, likely in the setting of known pulmonary disease. Patient with leukocytosis at admission, no clear source of infection but had been treated for COPD exacerbation w/ IV ceftriaxone/azithro. ON 7/5, blood cultures identified ESBL E coli bacteremia and treatment was escalated to meropenem (see below). O2 requirement findings likely explained by chronic COPD disease, now treated for exacerbation with a component of OSA.  COPD exacerbation: - Prednisone 40mg  daily (7/3- 7/7) - PO azitromycin 500 mg (7/3 - 7/7) - Duonebs q4HRs - Albuterol inhaler q4h PRN - Aformoterol nebulizer BID - Yupleri nebulizer daily - Budesonide nebulizer daily - IV ceftriaxone 1 gram (7/3, d/c 7/6 after blood cultures + for ESBL bacteremia)  Leukocytosis ESBL E Coli bacteremia Diverticular disease Patient started therapy with meropenem 1gm q8hrs. Patient with CT significant for diverticulosis with asymmetrical wall thickening involving sigmoid colon without inflammatory fat stranding noted on 7/3. Patient denied abdominal pain during exam today. Given leukocytosis, organisms, and evidence on imaging, this is  likely the source of infection. - Continue meropenem 1gm q8hrs (7/6)  - ID consulted; awaiting sensitivities for home antibiotic choice and length of treament  CKD, 3a Cr stable at 1.02  HTN SBPs variable, but stable - No current HTN medications   ?Peripheral neuropathy Chronic tingling in bilateral feet without no change in sensation. No history of PAD with good distal pulses. No hx of DM, A1c 5.8% on presentation. Patient with normal TSH. Will order B12 to rule out vit deficiency otherwise workout small vessel disease. - Vitamin B12   HLD - Continue home statin   Constipation - Continue Senna -S and Miralax   Tobacco use disorder -Nicotine patch   Obesity, class II - PCP follow up - Outpatient referral     Diet: regular VTE: lovenox Code: FULL PT/OT recs: outpatient PT with rollator (4 wheels). No OT follow up  Dispo: pending E. Coli sensitivities otherwise ready to be discharged in 1-2 days.  03-30-1998, MD Internal Medicine Resident, PGY 1 Please contact the on call pager after 5 pm and on weekends at [phone].

## 2022-05-06 DIAGNOSIS — Z1612 Extended spectrum beta lactamase (ESBL) resistance: Secondary | ICD-10-CM | POA: Diagnosis not present

## 2022-05-06 DIAGNOSIS — D72829 Elevated white blood cell count, unspecified: Secondary | ICD-10-CM | POA: Diagnosis not present

## 2022-05-06 DIAGNOSIS — G4733 Obstructive sleep apnea (adult) (pediatric): Secondary | ICD-10-CM | POA: Diagnosis not present

## 2022-05-06 DIAGNOSIS — B9620 Unspecified Escherichia coli [E. coli] as the cause of diseases classified elsewhere: Secondary | ICD-10-CM | POA: Diagnosis not present

## 2022-05-06 LAB — BASIC METABOLIC PANEL
Anion gap: 10 (ref 5–15)
BUN: 19 mg/dL (ref 8–23)
CO2: 29 mmol/L (ref 22–32)
Calcium: 9.1 mg/dL (ref 8.9–10.3)
Chloride: 100 mmol/L (ref 98–111)
Creatinine, Ser: 0.97 mg/dL (ref 0.44–1.00)
GFR, Estimated: >60 mL/min (ref >60)
Glucose, Bld: 92 mg/dL (ref 70–99)
Potassium: 4.3 mmol/L (ref 3.5–5.1)
Sodium: 139 mmol/L (ref 135–145)

## 2022-05-06 LAB — CBC
HCT: 44.6 % (ref 36.0–46.0)
Hemoglobin: 13.6 g/dL (ref 12.0–15.0)
MCH: 26.8 pg (ref 26.0–34.0)
MCHC: 30.5 g/dL (ref 30.0–36.0)
MCV: 87.8 fL (ref 80.0–100.0)
Platelets: 233 10*3/uL (ref 150–400)
RBC: 5.08 MIL/uL (ref 3.87–5.11)
RDW: 18.5 % — ABNORMAL HIGH (ref 11.5–15.5)
WBC: 12.3 10*3/uL — ABNORMAL HIGH (ref 4.0–10.5)
nRBC: 0 % (ref 0.0–0.2)

## 2022-05-06 MED ORDER — HYDROXYZINE HCL 10 MG PO TABS
10.0000 mg | ORAL_TABLET | Freq: Once | ORAL | Status: AC
Start: 2022-05-06 — End: 2022-05-06
  Administered 2022-05-06: 10 mg via ORAL
  Filled 2022-05-06: qty 1

## 2022-05-06 MED ORDER — FUROSEMIDE 10 MG/ML IJ SOLN
20.0000 mg | Freq: Once | INTRAMUSCULAR | Status: DC
  Filled 2022-05-06: qty 2

## 2022-05-06 MED ORDER — AMLODIPINE BESYLATE 5 MG PO TABS
5.0000 mg | ORAL_TABLET | Freq: Every day | ORAL | Status: DC
  Administered 2022-05-06 – 2022-05-09 (×4): 5 mg via ORAL
  Filled 2022-05-06 (×4): qty 1

## 2022-05-06 MED ORDER — CAMPHOR-MENTHOL 0.5-0.5 % EX LOTN: TOPICAL_LOTION | CUTANEOUS | Status: DC | PRN

## 2022-05-06 MED ORDER — LABETALOL HCL 5 MG/ML IV SOLN
5.0000 mg | Freq: Once | INTRAVENOUS | Status: AC
Start: 2022-05-06 — End: 2022-05-06
  Administered 2022-05-06: 5 mg via INTRAVENOUS
  Filled 2022-05-06: qty 4

## 2022-05-06 NOTE — Progress Notes (Signed)
RN will place pt on Bipap.

## 2022-05-06 NOTE — Progress Notes (Signed)
Occupational Therapy Treatment Patient Details Name: Kim [name] MRN: [id number] DOB: 1961-09-25 Today's Date: 05/06/2022   History of present illness Pt is a 61 y/o female presenting on 7/3 with dyspnea. Admitted with acute hypoxic respiratory failure from COPD exacerbation. PMH includes: COPD, OSA, hypertension, obesity.   OT comments  Patient received long sitting in bed and agreeable to OT session. Patient able to get to EOB without assistance and donned socks. Patient ambulated to sink for grooming without assistive device and transferred to recliner. Reviewed energy conservation strategies with cues to recall. Patient performed mobility with rollator in hallway on 4 liters of O2 and performed toileting with supervision for transfer. SpO2 at 88-90% on 4 liters. Acute OT to continue to follow.    Recommendations for follow up therapy are one component of a multi-disciplinary discharge planning process, led by the attending physician.  Recommendations may be updated based on patient status, additional functional criteria and insurance authorization.    Follow Up Recommendations  No OT follow up    Assistance Recommended at Discharge PRN  Patient can return home with the following  A little help with walking and/or transfers;A little help with bathing/dressing/bathroom;Assistance with cooking/housework   Equipment Recommendations  BSC/3in1    Recommendations for Other Services      Precautions / Restrictions Precautions Precautions: None Precaution Comments: watch O2 Restrictions Weight Bearing Restrictions: No       Mobility Bed Mobility Overal bed mobility: Independent             General bed mobility comments: able to get to EOB without assistance    Transfers Overall transfer level: Modified independent Equipment used: None Transfers: Sit to/from Stand Sit to Stand: Modified independent (Device/Increase time)           General transfer comment: line  mgmt/safety     Balance Overall balance assessment: Needs assistance Sitting-balance support: No upper extremity supported, Feet supported Sitting balance-Leahy Scale: Good Sitting balance - Comments: able to donn socks seated on EOB   Standing balance support: No upper extremity supported, During functional activity Standing balance-Leahy Scale: Fair Standing balance comment: stood at sink without UE support, performed mobility in room without AD                           ADL either performed or assessed with clinical judgement   ADL Overall ADL's : Needs assistance/impaired     Grooming: Supervision/safety;Standing;Wash/dry hands;Wash/dry face;Oral care Grooming Details (indicate cue type and reason): standing at sink             Lower Body Dressing: Supervision/safety;Sitting/lateral leans Lower Body Dressing Details (indicate cue type and reason): donned socks seated on EOB Toilet Transfer: Supervision/safety;Ambulation;Regular Teacher, adult education Details (indicate cue type and reason): used rails in bathroom to assist Toileting- Clothing Manipulation and Hygiene: Modified independent Toileting - Clothing Manipulation Details (indicate cue type and reason): performed toilet hyiene without assistance       General ADL Comments: on 4 liters of Oxygen with 88-90% O2 desaturation    Extremity/Trunk Assessment              Vision       Perception     Praxis      Cognition Arousal/Alertness: Awake/alert Behavior During Therapy: WFL for tasks assessed/performed Overall Cognitive Status: Within Functional Limits for tasks assessed  Exercises      Shoulder Instructions       General Comments reviewed energy conservation strategies. 4 liters of O2 with 88-90% desaturation    Pertinent Vitals/ Pain       Pain Assessment Pain Assessment: No/denies pain  Home Living                                           Prior Functioning/Environment              Frequency  Min 2X/week        Progress Toward Goals  OT Goals(current goals can now be found in the care plan section)  Progress towards OT goals: Progressing toward goals  Acute Rehab OT Goals Patient Stated Goal: go home OT Goal Formulation: With patient Time For Goal Achievement: 05/17/22 Potential to Achieve Goals: Good ADL Goals Pt Will Perform Grooming: with modified independence;standing Pt Will Perform Lower Body Dressing: with modified independence;sit to/from stand Pt Will Transfer to Toilet: with modified independence;ambulating Pt Will Perform Tub/Shower Transfer: Tub transfer;with modified independence;ambulating;3 in 1 Additional ADL Goal #1: Pt will utilize 3 energy conservation techniques during daily routine to optimize tolerance and independence.  Plan Discharge plan remains appropriate    Co-evaluation                 AM-PAC OT "6 Clicks" Daily Activity     Outcome Measure   Help from another person eating meals?: None Help from another person taking care of personal grooming?: A Little Help from another person toileting, which includes using toliet, bedpan, or urinal?: A Little Help from another person bathing (including washing, rinsing, drying)?: A Little Help from another person to put on and taking off regular upper body clothing?: A Little Help from another person to put on and taking off regular lower body clothing?: A Little 6 Click Score: 19    End of Session Equipment Utilized During Treatment: Rollator (4 wheels);Oxygen  OT Visit Diagnosis: Other abnormalities of gait and mobility (R26.89);Muscle weakness (generalized) (M62.81)   Activity Tolerance Patient tolerated treatment well   Patient Left in chair;with call bell/phone within reach   Nurse Communication Mobility status        Time: 0109-3235 OT Time Calculation (min): 36  min  Charges: OT General Charges $OT Visit: 1 Visit OT Treatments $Self Care/Home Management : 8-22 mins $Therapeutic Activity: 8-22 mins  Alfonse Flavors, OTA Acute Rehabilitation Services  Office [phone]   Dewain Penning 05/06/2022, 1:59 PM

## 2022-05-06 NOTE — Plan of Care (Signed)

## 2022-05-06 NOTE — Progress Notes (Signed)
Pt. C/o itching. Dr. Loney Loh paged for orders.

## 2022-05-06 NOTE — Progress Notes (Signed)
Hospital day#4 Subjective:    Overnight Events: 30 minutes of BiPAP. Some itching that got better after hydroxyzine. Otherwise wishes to go home soon  Objective:  Vital signs in last 24 hours: Vitals:   05/06/22 0739 05/06/22 0743 05/06/22 0744 05/06/22 1207  BP: (!) 164/74   (!) 183/95  Pulse: 80   82  Resp: 20   20  Temp: 97.6 F (36.4 C)   97.8 F (36.6 C)  TempSrc: Oral   Oral  SpO2: 93% 95% 95% 91%  Weight:      Height:       Supplemental O2: NOT wearing O2 today SpO2: 91 % O2 Flow Rate (L/min): 4 L/min   Physical Exam:   GENERAL:tired, appreating person sitting in bed in no apparent distress CV: regular rate, regular rhythm. No murmurs appreciated. Distal pulses 2+ bilaterally. No JVD appreciated. PULM: no work of breathing without [place] today.  Clear to auscultation bilaterally. No wheezes, ABD: Normal BS, non tender to palpation, non distended. MSK: No gross abnormalities. No pitting edema SKIN: Warm and dry.  No rashed noted on exam today NEURO Somnolent, but easily arausable, baseline. A&O x3 PSYCH: Appropriate mood and affect  Filed Weights   05/02/22 1905  Weight: 108.9 kg     Intake/Output Summary (Last 24 hours) at 05/06/2022 1329 Last data filed at 05/05/2022 1659 Gross per 24 hour  Intake 300.11 ml  Output --  Net 300.11 ml   Net IO Since Admission: 560.11 mL [05/06/22 1329]  Pertinent Labs:    Latest Ref Rng & Units 05/06/2022   12:36 AM 05/05/2022    1:53 AM 05/04/2022    2:04 AM  CBC  WBC 4.0 - 10.5 K/uL 12.3  11.9  15.0   Hemoglobin 12.0 - 15.0 g/dL 29.4  76.5  46.5   Hematocrit 36.0 - 46.0 % 44.6  45.9  46.1   Platelets 150 - 400 K/uL 233  224  219        Latest Ref Rng & Units 05/06/2022   12:36 AM 05/05/2022    1:53 AM 05/04/2022    2:04 AM  CMP  Glucose 70 - 99 mg/dL 92  87  035   BUN 8 - 23 mg/dL 19  20  20    Creatinine 0.44 - 1.00 mg/dL  4.65  6.81   Sodium 135 - 145 mmol/L 139  138  141   Potassium 3.5 - 5.1 mmol/L 4.3  4.4   4.7   Chloride 98 - 111 mmol/L 100  103  100   CO2 22 - 32 mmol/L 29  29  30    Calcium 8.9 - 10.3 mg/dL 9.1  9.3  9.3   Total Protein 6.5 - 8.1 g/dL   6.7   Total Bilirubin 0.3 - 1.2 mg/dL   0.5   Alkaline Phos 38 - 126 U/L   70   AST 15 - 41 U/L   54   ALT 0 - 44 U/L   41     Imaging: 2.75 EKG SITE RITE  Result Date: 05/05/2022 If Site Rite image not attached, placement could not be confirmed due to current cardiac rhythm.   Assessment/Plan:   Principal Problem:   Acute hypoxemic respiratory failure (HCC) Active Problems:   COPD exacerbation (HCC)   Essential hypertension   OSA (obstructive sleep apnea)   Erythrocytosis   Obesity, Class III, BMI 40-49.9 (morbid obesity) (HCC)   CKD (chronic kidney disease), stage III (HCC)  Patient Summary: Kim [name] is a 61 yo woman with a hx of COPD, HTN, CKD3a, HLD, OSA without home BiPAP admitted with acute hypoxic respiratory failure secondary to COPD exacerbation, now resolved, and ESBL E coli bacteremia and COPD exacerbation  ESBL E Coli bacteremia Likely in the setting of intraabdominal infection  Leukocytosis, improving WBC 12.3 on 7/7. Patient continues meropenem 1gm q8hrs. Patient denied abdominal pain.   Ceftriaxone now discontinued and transition to meropenem. ID was consulted for antibiotic management on 7/6. Awaiting sensitivities to arrange patient discharge - meropenem 1gm q8hrs (7/5)  - awaiting culture sensitivities for antibiotic discharge plan   Hypoxic respiratory failure Respiratory acidosis  COPD exacerbation, resolved OSA, attempting BiPAP Tolerated 30 min of BiPAP on 7/6. Patient now back to her baseline of using oxygen intermittently, without [place] during exam. Completed Azithromycin and prednisone courses today. Will continue manage COPD with nebs and inhalers. Encourage BiPAP machine at night.  COPD exacerbation: - Prednisone 40mg  daily (7/3- 7/7)** - PO azitromycin 500 mg (7/3 - 7/7)** - Duonebs q4HRs -  Albuterol inhaler q4h PRN - Aformoterol nebulizer BID - Yupleri nebulizer daily - Budesonide nebulizer daily  HTN SBPs overnight 160-180. SBPs during hospitalizationg had been in the low 140s, without medical management. If this continues, will consider adding self-discontinued home dose of amlodipine (5mg )  Constipation 1BM las night.  - Continue bowel regimen with Mirlax and Senna-S  CKD, 3a Baseline 1-1.1.   ?Peripheral neuropathy No history of PAD with good distal pulses. No hx of DM. TSH normal. A1c  5.8% - pending vit B12  HDL Stable. Continued on home pravastatin.   Tobacco use disorder Nicotine patch  Diet: regular VTE: lovenox Code: FULL PT/OT recs: outpatient PT with rollator (4 wheels). No OT follow up   Dispo: Anticipated discharge in 1-2 days pending microbiology sensitivities. Patient to be followd in [facility] clinic following discharge  , MD Internal Medicine Resident PGY-1 Please contact the on call pager after 5 pm and on weekends at [phone].

## 2022-05-06 NOTE — TOC Progression Note (Addendum)
Transition of Care [facility]) - Progression Note    Patient Details  Name: Kim [name] MRN: [id number] Date of Birth: 1961/10/05  Transition of Care [facility] CM/SW Contact  Lockie Pares, RN Phone Number: 05/06/2022, 11:29 AM  Clinical Narrative:     Update for disposition: Sensitivities on culture will be back tomorrow. It will be a t that time that antibiotics are hand tailored to the organism. May be PO or IV.   1600 Discussed with Pam , her company is not in network with her plan. She has consulted [employer] to provide IV abx if needed. She has sent all the information to [employer]  Expected Discharge Plan: OP Rehab Barriers to Discharge: Continued Medical Work up  Expected Discharge Plan and Services Expected Discharge Plan: OP Rehab   Discharge Planning Services: CM Consult Post Acute Care Choice: Durable Medical Equipment Living arrangements for the past 2 months: Single Family Home                 DME Arranged: 3-N-1, Shower stool, Walker rolling with seat DME Agency: [employer] Date DME Agency Contacted: 05/04/22 Time DME Agency Contacted: [phone] Representative spoke with at DME Agency: [place]             Social Determinants of Health (SDOH) Interventions    Readmission Risk Interventions     No data to display

## 2022-05-06 NOTE — Progress Notes (Signed)
Pt. C/o itching. Dr. Burnice Logan paged for orders.

## 2022-05-06 NOTE — Progress Notes (Signed)
Notified Dr. Burnice Logan of Pts. BP: 182/93.

## 2022-05-06 NOTE — Progress Notes (Signed)
Paged by RN regarding BP 211/97.  Patient seen and evaluated at bedside. Receiving breathing treatment.  Denies any headache, blurry vision, chest pain, change/worsening in her shortness of breath, abd pain.  BP had previously been checked on lower arm. Rechecked patient's BP using larger cuff on upper arm. Arm supported at heart level. BP upon recheck 210 systolic.   Patient says she does not want to have to get up frequently to urinate, but will consider taking lasix.  PE: Sitting up in bed, NAD Cardiac: RRR, no MRG, 1+ pitting edema bilateral lower extremities Lungs: breathing treatment, normal respiratory effort, crackles RLL ABD: soft, nontender, +BS  A/P: - Plan to restart home amlodipine 5mg .  - 5mg  labetalol one time for BP. Will plan to recheck BP shortly one BB has taken affect, may administer additional dose if necessary. - One time lasix dose for edema and crackles

## 2022-05-06 NOTE — Progress Notes (Signed)
Dr. Burnice Logan paged to notify of Pts. BP: 211/97.

## 2022-05-07 DIAGNOSIS — J441 Chronic obstructive pulmonary disease with (acute) exacerbation: Secondary | ICD-10-CM | POA: Diagnosis not present

## 2022-05-07 DIAGNOSIS — J9601 Acute respiratory failure with hypoxia: Secondary | ICD-10-CM | POA: Diagnosis not present

## 2022-05-07 DIAGNOSIS — F17210 Nicotine dependence, cigarettes, uncomplicated: Secondary | ICD-10-CM | POA: Diagnosis not present

## 2022-05-07 LAB — CBC
HCT: 46.7 % — ABNORMAL HIGH (ref 36.0–46.0)
Hemoglobin: 14.5 g/dL (ref 12.0–15.0)
MCH: 27.2 pg (ref 26.0–34.0)
MCHC: 31 g/dL (ref 30.0–36.0)
MCV: 87.5 fL (ref 80.0–100.0)
Platelets: 250 10*3/uL (ref 150–400)
RBC: 5.34 MIL/uL — ABNORMAL HIGH (ref 3.87–5.11)
RDW: 18.6 % — ABNORMAL HIGH (ref 11.5–15.5)
WBC: 11.6 10*3/uL — ABNORMAL HIGH (ref 4.0–10.5)
nRBC: 0 % (ref 0.0–0.2)

## 2022-05-07 LAB — BASIC METABOLIC PANEL
Anion gap: 5 (ref 5–15)
BUN: 20 mg/dL (ref 8–23)
CO2: 34 mmol/L — ABNORMAL HIGH (ref 22–32)
Calcium: 9.1 mg/dL (ref 8.9–10.3)
Chloride: 100 mmol/L (ref 98–111)
Creatinine, Ser: 1.17 mg/dL — ABNORMAL HIGH (ref 0.44–1.00)
GFR, Estimated: 53 mL/min — ABNORMAL LOW (ref >60)
Glucose, Bld: 112 mg/dL — ABNORMAL HIGH (ref 70–99)
Potassium: 4.3 mmol/L (ref 3.5–5.1)
Sodium: 139 mmol/L (ref 135–145)

## 2022-05-07 LAB — CULTURE, BLOOD (ROUTINE X 2)
Culture: NO GROWTH
Special Requests: ADEQUATE
Special Requests: ADEQUATE

## 2022-05-07 LAB — MAGNESIUM: Magnesium: 2.1 mg/dL (ref 1.7–2.4)

## 2022-05-07 NOTE — Progress Notes (Signed)
Hospital day#5 Subjective:   BP to 211/97. Rececheck 210 SBP, without HA, CP, worsening wob. Received labetalol 1x. Patient refused 1x dose lasix  This morning: Feeling okay, no increased work of breathing. Mild headache. Mostly frustrated because she wants to go home. Her husband has terminal cancer and wants to be with him  Objective:  Vital signs in last 24 hours: Vitals:   05/06/22 2314 05/06/22 2339 05/07/22 0329 05/07/22 0333  BP:  (!) 179/78 (!) 176/72   Pulse: 67 70 73 71  Resp: 18 18 20    Temp:  98.6 F (37 C) 97.7 F (36.5 C)   TempSrc:  Axillary Oral   SpO2: 93% 92%  90%  Weight:      Height:       Supplemental O2: using O2 intermittently, as she does at home SpO2: 90 % O2 Flow Rate (L/min): 4 L/min   Physical Exam:  GENERAL:tired, appreating person sitting in bed in no apparent distress CV: regular side effect, regular rhythm. No murmurs appreciated. Distal pulses 2+ bilaterally. No JVD appreciated. PULM: not increased work of breathing, using [place] with 3L. Clear to auscultation bilaterally, with mild crackles on the L bases not changed from prior. No wheezes. ABD: Normal BS, non tender to palpation, non distended. MSK: No gross abnormalities. No sacral or pitting edema appreciated SKIN: Warm and dry.  No rashes. NEURO Somnolent, but easily arausable, baseline. A&O x3 PSYCH: Appropriate mood. Sad and tearful today.  Filed Weights   05/02/22 1905  Weight: 108.9 kg    No intake or output data in the 24 hours ending 05/07/22 0525 Net IO Since Admission: 560.11 mL [05/07/22 0525]  Pertinent Labs:    Latest Ref Rng & Units 05/07/2022    1:01 AM 05/06/2022   12:36 AM 05/05/2022    1:53 AM  CBC  WBC 4.0 - 10.5 K/uL 11.6  12.3  11.9   Hemoglobin 12.0 - 15.0 g/dL 07/06/2022  67.1  24.5   Hematocrit 36.0 - 46.0 % 46.7  44.6  45.9   Platelets 150 - 400 K/uL 250  233  224        Latest Ref Rng & Units 05/07/2022    1:01 AM 05/06/2022   12:36 AM 05/05/2022    1:53 AM   CMP  Glucose 70 - 99 mg/dL 07/06/2022  92  87   BUN 8 - 23 mg/dL 20  19  20    Creatinine 0.44 - 1.00 mg/dL 983   3.82   Sodium 135 - 145 mmol/L 139  139  138   Potassium 3.5 - 5.1 mmol/L 4.3  4.3  4.4   Chloride 98 - 111 mmol/L 100  100  103   CO2 22 - 32 mmol/L 34  29  29   Calcium 8.9 - 10.3 mg/dL 9.1  9.1  9.3    Micro:  Imaging: No results found.  Assessment/Plan:   Principal Problem:   Acute hypoxemic respiratory failure (HCC) Active Problems:   COPD exacerbation (HCC)   Essential hypertension   OSA (obstructive sleep apnea)   Erythrocytosis   Obesity, Class III, BMI 40-49.9 (morbid obesity) (HCC)   CKD (chronic kidney disease), stage III (HCC)   Patient Summary:  Kim [name] is a 61 yo woman with a hx of COPD, HTN, CKD3a, HLD, OSA without home BiPAP admitted with acute hypoxic respiratory failure secondary to COPD exacerbation, for which she completed treatment on 7/7, now being managed for ESBL, E coli  bacteremia on meropenem (awaiting blood culture sensitivities for discharge) and new uncontrolled HTN  ESBL E Coli bacteremia Likely in the setting of intraabdominal infection  Leukocytosis, improving Patient with CT significant for diverticulosis with asymmetrical wall thickening involving sigmoid colon without inflammatory fat stranding noted on 7/3. Patient denied abdominal pain. Given leukocytosis, organisms, and evidence on imaging, this is likely the source of infection. ID was consulted for antibiotic management on 7/6.  - WBC on 7/8 11.6, improving from yesterday. Patient continues  meropenem 1gm q8hrs.  - Continue meropenem 1gm q8hrs (7/5-)  - ID following, sensitivities pending- needed for antibiotic choice for discharge planning  HTN SBPs during hospitalizationg had been in the low 140s, without antihypertensive management until the past couple of days. BP 211/97 at on 7/7 without headache, blurry vision, chest pain or change in respiratory status.  Labetalol administered. Patient started on amlodipine 5mg  on 7/7.  SBP 170s today. Previously 12 beat VTACH runs on tele. Patient refuses to wear telemetry leads; will address today given increased blood pressures.  - Continue on amlodipine  Chronic hypoxic respiratory failure COPD Respiratory acidosis  ?Community acquired pneumonia OSA Echo showed normal LBEF 60-65% with normal LV function. RV systolic funcion low normal, and RV moderately enlarged with elevated pulmonary artery systolic pressure, likely in the setting of known pulmonary disease. Patient with leukocytosis at admission, treated for presumed community acquired pneumonia with IV ceftriaxone. ON 7/5, blood cultures identified ESBL E coli bacteremia and treatemnt was escalated to meropenem. Patient completed COPD exacerbation course on 7/7. Now managing chronic COPD and OSA. Today 7/8: - Using O2 intermittently, as she does at home - continues: - Duonebs q4HRs - Albuterol inhaler q4h PRN - Aformoterol nebulizer BID - Yupleri nebulizer daily - Budesonide nebulizer daily COPD exacerbation, treatment completed: - Prednisone 40mg  daily (7/3- 7/7)** - PO azitromycin 500 mg (7/3 - 7/7)** - IV ceftriaxone 1 gram (7/3, d/c 7/6 after blood cultures + for ESBL bacteremia)  Constipation Patient with hx of no BM and abdominal pain for a week on admission. Pain resolved after Miralax and Senna-S therapy. Last BM on 7/7.  - Continue Senna-S and Miralax  CKD, 3a Baseline 1-1.1. Today 1.17  ?Peripheral neuropathy Intermitted tingling in bilateral feet. No history of PAD with good distal pulses. No hx of DM, A1c 5.8%. Pending vit 12. Follow outpatient.   HDL Stable. Continued on home pravastatin.   Obesity, class III - Manage outpatient with referral to nutrition and weigh loss clinic  Tobacco use disorder - Nicotine patch  Diet: regular VTE: lovenox Code: FULL PT/OT recs: outpatient PT with rollator (4 wheels). No OT follow  up   Dispo: Patient awaiting blood culture sensitivities to narrow antibiotic needs for discharge planning.  9/6, MD Internal Medicine Resident PGY-1 Please contact the on call pager after 5 pm and on weekends at [phone].

## 2022-05-07 NOTE — Progress Notes (Signed)
Pharmacy Antibiotic Note- follow-up  Kim [name] is a 61 y.o. female admitted on 05/02/2022 with bacteremia.  Pharmacy has been consulted for zosyn dosing.  Plan: Continue Merrem 1 gram iv q8h   Height: 5\' 5"  (165.1 cm) Weight: 108.9 kg (240 lb 1.3 oz) IBW/kg (Calculated) : 57  Temp (24hrs), Avg:97.9 F (36.6 C), Min:97.6 F (36.4 C), Max:98.6 F (37 C)  Recent Labs  Lab 05/02/22 0323 05/02/22 0331 05/02/22 0531 05/02/22 1407 05/03/22 0122 05/04/22 0204 05/05/22 0153 05/06/22 0036 05/07/22 0101  WBC 19.4*  --   --   --  17.0* 15.0* 11.9* 12.3* 11.6*  CREATININE 1.26*   < >  --    < > 1.12* 1.24* 1.02* 0.97 1.17*  LATICACIDVEN 2.0*  --  1.9  --   --   --   --   --   --    < > = values in this interval not displayed.    Estimated Creatinine Clearance: 62 mL/min (A) (by C-G formula based on SCr of 1.17 mg/dL (H)).    No Known Allergies   Microbiology results: 7/3 BCx: E. coli  ESBL   Thank you for allowing pharmacy to be a part of this patient's care.  07/08/22 BS, PharmD, BCPS Clinical Pharmacist 05/07/2022 10:06 AM  Contact: [phone] after 3 PM  "Be curious, not judgmental..." -[phone]

## 2022-05-07 NOTE — Progress Notes (Signed)
Id brief update   No oral option available for the ecoli   Patient will need picc/midline to finish the 10 day total course antibiotics   If discharged please set up hh for ertapenem 1 gram iv daily to finish the 10 day course counting meropenem days here too  While here can keep on meropenem   ----------- Opat order placed for ertapnem to finish on 7/15  Picc can be removed at that time  Given short duration no labs indicated  Ok to discharge from id standpoint

## 2022-05-07 NOTE — Progress Notes (Signed)
Patient placed on BIPAP for the night.  Tolerating well at this time.  RT will continue to monitor.  

## 2022-05-07 NOTE — Discharge Summary (Addendum)
Name: Kim [name] MRN: [id number] DOB: 07/17/1961 61 y.o. PCP: Nechama Guard, FNP  Date of Admission: 05/02/2022  2:40 AM Date of Discharge: 05/09/2022 5 PM Attending Physician: Dr. Dickie La  Discharge Diagnosis: Principal Problem:   Acute hypoxemic respiratory failure (HCC) Active Problems:   COPD exacerbation (HCC)   Essential hypertension   OSA (obstructive sleep apnea)   Erythrocytosis   Obesity, Class III, BMI 40-49.9 (morbid obesity) (HCC)   CKD (chronic kidney disease), stage III [facility])    Discharge Medications: Allergies as of 05/09/2022   No Known Allergies      Medication List     TAKE these medications    acetaminophen 500 MG tablet Commonly known as: TYLENOL Take 500 mg by mouth 3 (three) times daily as needed (pain).   albuterol 108 (90 Base) MCG/ACT inhaler Commonly known as: VENTOLIN HFA Inhale 2 puffs into the lungs every 4 (four) hours as needed for wheezing or shortness of breath.   amLODipine 5 MG tablet Commonly known as: NORVASC Take 1 tablet (5 mg total) by mouth daily.   budesonide 0.5 MG/2ML nebulizer solution Commonly known as: PULMICORT Take 2 mLs (0.5 mg total) by nebulization 2 (two) times daily.   ertapenem  IVPB Commonly known as: INVANZ Inject 1 g into the vein daily for 5 days. Indication:  ESBL bacteremia First Dose: Yes Last Day of Therapy:  05/14/2022 Labs - Once weekly:  CBC/D and BMP, Labs - Every other week:  ESR and CRP Method of administration: Mini-Bag Plus / Gravity Method of administration may be changed at the discretion of home infusion pharmacist based upon assessment of the patient and/or caregiver's ability to self-administer the medication ordered.   nicotine 14 mg/24hr patch Commonly known as: NICODERM CQ - dosed in mg/24 hours Place 1 patch (14 mg total) onto the skin daily.   polyethylene glycol powder 17 GM/SCOOP powder Commonly known as: GLYCOLAX/MIRALAX Take 17 g by mouth daily. Start taking  on: May 10, 2022   pravastatin 20 MG tablet Commonly known as: PRAVACHOL Take 20 mg by mouth at bedtime.   senna-docusate 8.6-50 MG tablet Commonly known as: Senokot-S Take 1 tablet by mouth at bedtime as needed for mild constipation.   TUMS PO Take 2 tablets by mouth 2 (two) times daily as needed (heartburn).   umeclidinium-vilanterol 62.5-25 MCG/ACT Aepb Commonly known as: ANORO ELLIPTA Inhale 1 puff into the lungs daily.               Durable Medical Equipment  (From admission, onward)           Start     Ordered   05/09/22 1234  DME 3-in-1  Once        05/09/22 1234   05/09/22 1234  DME Shower stool  Once        05/09/22 1234   05/09/22 1234  DME Walker  Once       Question Answer Comment  Walker: With 5 Inch Wheels   Patient needs a walker to treat with the following condition Shortness of breath      05/09/22 1234   05/04/22 1520  For home use only DME 4 wheeled rolling walker with seat  Once       Question Answer Comment  Patient needs a walker to treat with the following condition Weakness   Patient needs a walker to treat with the following condition Morbid (severe) obesity due to excess calories (HCC)  05/04/22 1519   05/04/22 1345  For home use only DME Shower stool  Once        05/04/22 1344   05/04/22 1343  For home use only DME 3 n 1  Once        05/04/22 1344              Discharge Care Instructions  (From admission, onward)           Start     Ordered   05/09/22 0000  Change dressing on IV access line weekly and PRN  (Home infusion instructions - [facility] )        05/09/22 1104            Disposition and follow-up:   Ms.Arnika L Kruszka was discharged from [facility] in Stable condition.  At the hospital follow up visit please address:  1.  Follow up:  ESBL E Coli bacteremia Abdominal pain on admission, now asymptomatic. Leukocytosis improving on antibiotic treatment. CT abdomen  showed asymmetrical thickening in sigmoid colon with diverticulosis, likely the source of infection. Will need follow up to monitor symptoms and for medication side effects, cbc, and BMP. - Midline access line removal by [facility] nurse - Home Ertapenem treatment until 7/15  COPD Increased oxygen demand on admission, managed for COPD exacerbation, now back to baseline O2 requirements on 3L of oxygen. Increased CO2 retention, worsened by OSA (see below). Patient being discharged with albuterol and budesonide inhalers, and anoro ellipta. Recommend repeat PFTs on follow-up.  OSA Increased somnolence and evidence of CO2 retention during this hospitalization likely in the setting of OSA. Recommend sleep study on follow up. Patient would likely benefit from BiPAP home treatments  HTN Patient with symptomatic hypertension during this hospitalization.  Restarted on 5 mg amlodipine. Continue to monitor symptoms,  BP, and BMP for electrolytes and kidney function Consider outpatient cardiac monitor given [facility] runs during this hospitalization  Infrarenal Abdominal Aortic  Aneurysm Follow up with U/S every 3 years  HDL Monitor lipid panel and continue on home statin  Diverticulosis Constipation Monitor for frequent BM and adherence to bowel regimen  Obesity, class III Recommend outpatient referral to nutrition and weight loss management  Tobacco use disorder Recommend referral to tobacco cessation clinic  Peripheral neuropathy Monitor and manage symptoms  2.  Labs / imaging needed at time of follow-up: cbc, BMP, BP, PFTs, sleep study  3.  Pending labs/ test needing follow-up: None  4.  Medication Changes  New medications  - Ertapenem 1g daily until 7/15 - Albuterol inhaler q4HR PRN - Anoro Ellipta daily  Continue: - Budesonide nebulizer daily  Follow-up Appointments:  Follow-up Information     [facility] Schedule an appointment as soon as possible for a  visit.   Specialty: Rehabilitation Why: Call to make apt for physical therapy Contact information: [address] [phone] mc [address] [place] [address] [phone]        Modena Slater, DO Follow up.   Why: TIME : 8:45AM DATE: May 17, 2022 LOCATION : [facility], [place] FLOOR [address] [place], [place], [place] [address] Contact information: [address] [place] [address] [phone]                 Hospital Course by problem list: Ghala Stegen is a 61 yo woman with a hx of COPD, HTN, CKD3a, HLD, OSA without home BiPAP admitted with acute hypoxic respiratory  failure secondary to COPD exacerbation , for which she completed acute treatment, and found to have ESBL E coli bacteremia being discharged with IV ertapenem antibiotic treatment with midline access, stable and with the hospital course detailed below:  ESBL E Coli bacteremia Likely in the setting of intraabdominal infection  Leukocytosis, improving Patient with CT significant for diverticulosis with asymmetrical wall thickening involving sigmoid colon without inflammatory fat stranding noted on 7/3. Patient denied abdominal pain. Given leukocytosis, organisms, and evidence on imaging, this is likely the source of infection. Patient inially empirically treated with Ceftriaxone was transitioned to meropenem per ID recommendations following culture data. After culture sensitivities resulted, patient was switched to ertapenem. Patient will complete antibiotic treatment through midline access with home health nursing help. WBC downtrending and patient clinically stable.   COPD exacerbation Chronic hypoxic respiratory failure Respiratory acidosis  OSA Echo showed normal LVEF 60-65% with normal LV function. RV systolic funcion low normal, and RV moderately enlarged with elevated pulmonary artery systolic pressure, likely in the setting of known pulmonary disease. Patient with  leukocytosis at admission, treated for presumed community acquired pneumonia with IV ceftriaxone. ON 7/5, blood cultures identified ESBL E coli bacteremia and treatement was escalated.  Patient completed acute treatment for exacerbation, including azithromycin and prednisone. Clinical improvement and back to her home O2 needs. Discharged with albuterol inhaler, Anoro ellipta, and budesonide nebulizer. Encouraged to use BiPAP during this admission given evidence of carbon dioxide retention. Recommend outpatient sleep study and PFTs.   HTN  SBPs during hospitalizationg had been in the low 140s, without medical management until the past couple of days. BP 211/97 at on 7/7 without headache, blurry vision, chest pain or change in respiratory status. Patient started on amlodipine 5mg  on 7/6. Previously 12 beat VTACH runs on tele. Patient SBPs in 150s after starting amlodipine. Consider outpatient cardiac monitoring and managing anti-hypertensive management.  Constipation Patient with hx of no BM and abdominal pain for a week on admission. Pain resolved after Miralax and Senna-S therapy. Last BM Continue therapy as outpatient.  CKD, 3a Baseline 1-1.1. Today 0.96  ?Peripheral neuropathy Intermitted tingling in bilateral feet. No history of PAD with good distal pulses. No hx of DM, A1c 5.8%. Vitamin B12 wnl. Follow outpatient.   Obesity, class III Consider outpatient referral to weight loss clinic and nutrition  HDL Stable. Continued on home pravastatin.   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 Follow up with U/S every 3 years  Elevated troponin Peaked at 130 -> 92. Resolved.  Erythrocytosis, resolved. EPO within normal level 3.9.  Discharge Subjective: She is feeling okay, a little bit of a headache. States that she used bipap for about an hour and a half yesterday night.   Discharge Exam:    BP (!) 189/85 (BP Location: Left Wrist)   Pulse 79   Temp 98 F (36.7 C) (Oral)   Resp 18   Ht 5\' 5"  (1.651 m)   Wt 108.9 kg   SpO2 91%   BMI 39.95 kg/m  Constitutional: chronically ill-appearing woman sitting in bed, in no acute distress Neck: supple Cardiovascular: regular rate and rhythm, no m/r/g Pulmonary/Chest: normal work of breathing on room air, lungs clear to auscultation bilaterally Abdominal: soft, non-tender, non-distended MSK: normal bulk and tone, ambulating appropriately with rollator chair. No peripheral edema Neurological: alert & oriented x 3 Skin: warm and dry Psych: appropriate mood and pleasant affect  Pertinent Labs,  Studies, and Procedures:     Latest Ref Rng & Units 05/07/2022    1:01 AM 05/06/2022   12:36 AM 05/05/2022    1:53 AM  CBC  WBC 4.0 - 10.5 K/uL 11.6  12.3  11.9   Hemoglobin 12.0 - 15.0 g/dL 16.1  09.6  04.5   Hematocrit 36.0 - 46.0 % 46.7  44.6  45.9   Platelets 150 - 400 K/uL 250  233  224        Latest Ref Rng & Units 05/07/2022    1:01 AM 05/06/2022   12:36 AM 05/05/2022    1:53 AM  CMP  Glucose 70 - 99 mg/dL 409  92  87   BUN 8 - 23 mg/dL 20  19  20    Creatinine 0.44 - 1.00 mg/dL 8.11  9.14  7.82   Sodium 135 - 145 mmol/L 139  139  138   Potassium 3.5 - 5.1 mmol/L 4.3  4.3  4.4   Chloride 98 - 111 mmol/L 100  100  103   CO2 22 - 32 mmol/L 34  29  29   Calcium 8.9 - 10.3 mg/dL 9.1  9.1  9.3     CT Angio Chest PE W and/or Wo Contrast  Result Date: 05/02/2022 CLINICAL DATA:  Rule out pulmonary embolism. Shortness of breath. Patient also complains of constipation and abdominal pain. EXAM: CT ANGIOGRAPHY CHEST CT ABDOMEN AND PELVIS WITH CONTRAST TECHNIQUE: Multidetector CT imaging of the chest was performed using the standard protocol during bolus administration of intravenous contrast. Multiplanar CT image reconstructions and MIPs were obtained to evaluate the vascular anatomy. Multidetector CT imaging of the abdomen and pelvis was  performed using the standard protocol during bolus administration of intravenous contrast. RADIATION DOSE REDUCTION: This exam was performed according to the departmental dose-optimization program which includes automated exposure control, adjustment of the mA and/or kV according to patient size and/or use of iterative reconstruction technique. CONTRAST:  OMNIPAQUE IOHEXOL 350 MG/ML SOLN COMPARISON:  09/02/2019 FINDINGS: CTA CHEST FINDINGS Cardiovascular: Satisfactory opacification of the pulmonary arteries to the segmental level. No evidence of pulmonary embolism. Normal heart size. No pericardial effusion. Aortic atherosclerosis and coronary artery calcifications. Mediastinum/Nodes: No enlarged mediastinal, hilar, or axillary lymph nodes. Thyroid gland, trachea, and esophagus demonstrate no significant findings. Lungs/Pleura: No pleural effusion identified. There is mild respiratory motion artifact diminishing exam detail within the lower lung zones. No signs of pneumothorax, pleural effusion, airspace consolidation or atelectasis. No suspicious pulmonary nodule or mass identified. Musculoskeletal: No chest wall abnormality. No acute or significant osseous findings. Review of the MIP images confirms the above findings. CT ABDOMEN and PELVIS FINDINGS Hepatobiliary: No focal liver abnormality is seen. No gallstones, gallbladder wall thickening, or biliary dilatation. Pancreas: Unremarkable. No pancreatic ductal dilatation or surrounding inflammatory changes. Spleen: Normal in size without focal abnormality. Adrenals/Urinary Tract: Adrenal glands are unremarkable. Kidneys are normal, without renal calculi, focal lesion, or hydronephrosis. Bladder is unremarkable. Stomach/Bowel: Stomach appears normal. The appendix is not confidently identified. There are no secondary signs of acute appendicitis I liver no small bowel wall thickening, inflammation, or distension. Sigmoid diverticulosis identified. Mild  asymmetric wall thickening involving the sigmoid colon is noted, image 69/4 without significant surrounding inflammatory fat stranding. Vascular/Lymphatic: Aortic atherosclerosis. Infrarenal abdominal aortic aneurysm measures 3.2 cm, image 46/4. No abdominopelvic adenopathy. Reproductive: Uterus and bilateral adnexa are unremarkable. Other: No free fluid or fluid collections Musculoskeletal: No acute or significant osseous findings. Review of the MIP images confirms  the above findings. IMPRESSION: 1. No evidence for acute pulmonary embolus. 2. Sigmoid diverticulosis with mild asymmetric wall thickening involving the sigmoid colon. No surrounding inflammatory fat stranding noted. Cannot exclude chronic diverticular disease. 3. Infrarenal abdominal aortic aneurysm measures 3.2 cm. Recommend follow-up ultrasound every 3 years. This recommendation follows ACR consensus guidelines: White Paper of the ACR Incidental Findings Committee II on Vascular Findings. J Am Coll Radiol 2013; 10:789-794. 4. Coronary artery calcifications. 5. Aortic Atherosclerosis (ICD10-I70.0). Electronically Signed   By: Signa Kell M.D.   On: 05/02/2022 07:24   CT ABDOMEN PELVIS W CONTRAST  Result Date: 05/02/2022 CLINICAL DATA:  Rule out pulmonary embolism. Shortness of breath. Patient also complains of constipation and abdominal pain. EXAM: CT ANGIOGRAPHY CHEST CT ABDOMEN AND PELVIS WITH CONTRAST TECHNIQUE: Multidetector CT imaging of the chest was performed using the standard protocol during bolus administration of intravenous contrast. Multiplanar CT image reconstructions and MIPs were obtained to evaluate the vascular anatomy. Multidetector CT imaging of the abdomen and pelvis was performed using the standard protocol during bolus administration of intravenous contrast. RADIATION DOSE REDUCTION: This exam was performed according to the departmental dose-optimization program which includes automated exposure control, adjustment of the  mA and/or kV according to patient size and/or use of iterative reconstruction technique. CONTRAST:  OMNIPAQUE IOHEXOL 350 MG/ML SOLN COMPARISON:  09/02/2019 FINDINGS: CTA CHEST FINDINGS Cardiovascular: Satisfactory opacification of the pulmonary arteries to the segmental level. No evidence of pulmonary embolism. Normal heart size. No pericardial effusion. Aortic atherosclerosis and coronary artery calcifications. Mediastinum/Nodes: No enlarged mediastinal, hilar, or axillary lymph nodes. Thyroid gland, trachea, and esophagus demonstrate no significant findings. Lungs/Pleura: No pleural effusion identified. There is mild respiratory motion artifact diminishing exam detail within the lower lung zones. No signs of pneumothorax, pleural effusion, airspace consolidation or atelectasis. No suspicious pulmonary nodule or mass identified. Musculoskeletal: No chest wall abnormality. No acute or significant osseous findings. Review of the MIP images confirms the above findings. CT ABDOMEN and PELVIS FINDINGS Hepatobiliary: No focal liver abnormality is seen. No gallstones, gallbladder wall thickening, or biliary dilatation. Pancreas: Unremarkable. No pancreatic ductal dilatation or surrounding inflammatory changes. Spleen: Normal in size without focal abnormality. Adrenals/Urinary Tract: Adrenal glands are unremarkable. Kidneys are normal, without renal calculi, focal lesion, or hydronephrosis. Bladder is unremarkable. Stomach/Bowel: Stomach appears normal. The appendix is not confidently identified. There are no secondary signs of acute appendicitis I liver no small bowel wall thickening, inflammation, or distension. Sigmoid diverticulosis identified. Mild asymmetric wall thickening involving the sigmoid colon is noted, image 69/4 without significant surrounding inflammatory fat stranding. Vascular/Lymphatic: Aortic atherosclerosis. Infrarenal abdominal aortic aneurysm measures 3.2 cm, image 46/4. No abdominopelvic  adenopathy. Reproductive: Uterus and bilateral adnexa are unremarkable. Other: No free fluid or fluid collections Musculoskeletal: No acute or significant osseous findings. Review of the MIP images confirms the above findings. IMPRESSION: 1. No evidence for acute pulmonary embolus. 2. Sigmoid diverticulosis with mild asymmetric wall thickening involving the sigmoid colon. No surrounding inflammatory fat stranding noted. Cannot exclude chronic diverticular disease. 3. Infrarenal abdominal aortic aneurysm measures 3.2 cm. Recommend follow-up ultrasound every 3 years. This recommendation follows ACR consensus guidelines: White Paper of the ACR Incidental Findings Committee II on Vascular Findings. J Am Coll Radiol 2013; 10:789-794. 4. Coronary artery calcifications. 5. Aortic Atherosclerosis (ICD10-I70.0). Electronically Signed   By: Signa Kell M.D.   On: 05/02/2022 07:24   DG Chest Port 1 View  Result Date: 05/02/2022 CLINICAL DATA:  Questionable sepsis. EXAM: PORTABLE CHEST 1 VIEW  COMPARISON:  Mar 28, 2020 FINDINGS: The cardiac silhouette is borderline in size and unchanged in appearance. There is no evidence of acute infiltrate, pleural effusion or pneumothorax. The visualized skeletal structures are unremarkable. IMPRESSION: No active cardiopulmonary disease. Electronically Signed   By: Aram Candela M.D.   On: 05/02/2022 03:06     Discharge Instructions: Discharge Instructions     Ambulatory referral to Physical Therapy   Complete by: As directed        Signed:  Morene Crocker, MD 05/09/2022, 4:12 PM   Pager: [phone]

## 2022-05-07 NOTE — Plan of Care (Signed)

## 2022-05-08 DIAGNOSIS — F17210 Nicotine dependence, cigarettes, uncomplicated: Secondary | ICD-10-CM | POA: Diagnosis not present

## 2022-05-08 DIAGNOSIS — J9601 Acute respiratory failure with hypoxia: Secondary | ICD-10-CM | POA: Diagnosis not present

## 2022-05-08 DIAGNOSIS — J441 Chronic obstructive pulmonary disease with (acute) exacerbation: Secondary | ICD-10-CM | POA: Diagnosis not present

## 2022-05-08 MED ORDER — ERTAPENEM SODIUM 1 G IJ SOLR
1.0000 g | INTRAMUSCULAR | Status: DC
  Administered 2022-05-08: 1000 mg via INTRAMUSCULAR
  Filled 2022-05-08 (×2): qty 1

## 2022-05-08 NOTE — Progress Notes (Addendum)
PHARMACY CONSULT NOTE FOR:  OUTPATIENT  PARENTERAL ANTIBIOTIC THERAPY (OPAT)  Indication: ESBL bacteremia Regimen: Ertapenem 1 gram IV qdaily End date: 05/14/2022  IV antibiotic discharge orders are pended. To discharging provider:  please sign these orders via discharge navigator,  Select New Orders & click on the button choice - Manage This Unsigned Work.     Thank you for allowing pharmacy to be a part of this patient's care.  Greta Doom BS, PharmD, BCPS Clinical Pharmacist 05/08/2022 10:34 AM  Contact: [phone] after 3 PM  "Be curious, not judgmental..." -Debbora Dus   3:15 pm - note updated to 05/14/22 as end date  Thank you Okey Regal, PharmD

## 2022-05-08 NOTE — Plan of Care (Signed)

## 2022-05-08 NOTE — TOC Progression Note (Addendum)
Transition of Care [facility]) - Progression Note    Patient Details  Name: Kim [name] MRN: [id number] Date of Birth: 1961/08/03  [facility] CM/SW Contact  Kermit Balo, RN Phone Number: 05/08/2022, 2:25 PM  Clinical Narrative:    Per MD pt to get PICC tomorrow and will be ready for d/c.  CM spoke to [place] through [employer]. CM provided her the IV abx for home and they will start insurance for authorization. Suzette Battiest is going to see if they can also get authorization for a RN to visit one or twice.  She will call CM tomorrow with updated.  MD we will need the signed OPAT orders to send to Ladora Daniel: [phone]   Expected Discharge Plan: OP Rehab Barriers to Discharge: Continued Medical Work up  Expected Discharge Plan and Services Expected Discharge Plan: OP Rehab   Discharge Planning Services: CM Consult Post Acute Care Choice: Durable Medical Equipment Living arrangements for the past 2 months: Single Family Home                 DME Arranged: 3-N-1, Shower stool, Walker rolling with seat DME Agency: [employer] Date DME Agency Contacted: 05/04/22 Time DME Agency Contacted: [phone] Representative spoke with at DME Agency: Suzanna Obey             Social Determinants of Health (SDOH) Interventions    Readmission Risk Interventions     No data to display

## 2022-05-08 NOTE — Progress Notes (Signed)
Hospital day#6 Subjective:   No events overnight. Tolerated BiPAP last night. Doing well this morning, expecting to go home soon.  Objective:  Vital signs in last 24 hours: Vitals:   05/08/22 0401 05/08/22 0740 05/08/22 0838 05/08/22 1311  BP: (!) 177/86  (!) 153/80 (!) 167/73  Pulse: 70  68 72  Resp: 16  18 18   Temp: 98 F (36.7 C)  97.9 F (36.6 C) 97.9 F (36.6 C)  TempSrc: Oral  Oral Oral  SpO2: 94% 97%    Weight:      Height:       Supplemental O2: using O2 intermittently, as she does at home SpO2: 97 % O2 Flow Rate (L/min): 4 L/min   Physical Exam:  GENERAL: chronically ill-person sitting in bed, in NAD CV: regular rate and rhythm, no murmurs or gallops appreciated. No JVP. PULM: normal work of breathing, on room air. Clear to auscultation bilaterally ABD: Normal BS, non tender, non distended MSK: No gross abnormalities, no sacral or pitting edema on exam SKIN: Warm and dry NEURO Alert and oriented x3 PSYCH: Pleasant affect today  Filed Weights   05/02/22 1905  Weight: 108.9 kg     Intake/Output Summary (Last 24 hours) at 05/08/2022 1333 Last data filed at 05/08/2022 0601 Gross per 24 hour  Intake 600 ml  Output 1250 ml  Net -650 ml   Net IO Since Admission: -689.89 mL [05/08/22 1333]  Pertinent Labs:    Latest Ref Rng & Units 05/07/2022    1:01 AM 05/06/2022   12:36 AM 05/05/2022    1:53 AM  CBC  WBC 4.0 - 10.5 K/uL 11.6  12.3  11.9   Hemoglobin 12.0 - 15.0 g/dL 07/06/2022  77.9  39.0   Hematocrit 36.0 - 46.0 % 46.7  44.6  45.9   Platelets 150 - 400 K/uL 250  233  224        Latest Ref Rng & Units 05/07/2022    1:01 AM 05/06/2022   12:36 AM 05/05/2022    1:53 AM  CMP  Glucose 70 - 99 mg/dL 07/06/2022  92  87   BUN 8 - 23 mg/dL 20  19  20    Creatinine 0.44 - 1.00 mg/dL 923   3.00   Sodium 135 - 145 mmol/L 139  139  138   Potassium 3.5 - 5.1 mmol/L 4.3  4.3  4.4   Chloride 98 - 111 mmol/L 100  100  103   CO2 22 - 32 mmol/L 34  29  29   Calcium 8.9 -  10.3 mg/dL 9.1  9.1  9.3    Micro:   Imaging: No results found.  Assessment/Plan:   Principal Problem:   Acute hypoxemic respiratory failure (HCC) Active Problems:   COPD exacerbation (HCC)   Essential hypertension   OSA (obstructive sleep apnea)   Erythrocytosis   Obesity, Class III, BMI 40-49.9 (morbid obesity) (HCC)   CKD (chronic kidney disease), stage III (HCC)   Patient Summary:  Kim [name] is a 61 yo woman with a hx of COPD, HTN, CKD3a, HLD, OSA without home BiPAP admitted with acute hypoxic respiratory failure secondary to COPD exacerbation, for which she completed treatment on 7/7, now being managed for ESBL, E coli bacteremia on meropenem (awaiting blood culture sensitivities for discharge) and new uncontrolled HTN  ESBL E Coli bacteremia Likely in the setting of intraabdominal infection  Leukocytosis, improving Patient with CT significant for diverticulosis with asymmetrical wall thickening involving  sigmoid colon without inflammatory fat stranding noted on 7/3. Patient denied abdominal pain. Given leukocytosis, organisms, and evidence on imaging, this is likely the source of infection. ID was consulted for antibiotic management on 7/6. Culture susceptibility showed sensitivities to Imipenem. ID recommends rounding 10-day antibiotic course with Ertapenem 1 gram daily (until 7/15). Patient will need PICC line and home health to assist. - No labs today - Finished meropenem 1gm q8hrs today (7/5-9)  - Will switch to Ertapenem tomorrow morning, as we plan discharge - PICC line on 7/10 - ID following, appreciate recs  HTN SBPs during hospitalizationg had been in the low 140s, without antihypertensive management until the past couple of days. BP 211/97 at on 7/7 without headache, blurry vision, chest pain or change in respiratory status. Patient started on amlodipine 5mg  on 7/7.  SBP 150-160s today.  Patient refuses to wear telemetry leads; discharged orders - Continue on  amlodipine  Chronic hypoxic respiratory failure COPD Respiratory acidosis  ?Community acquired pneumonia OSA Echo showed normal LBEF 60-65% with normal LV function. RV systolic funcion low normal, and RV moderately enlarged with elevated pulmonary artery systolic pressure, likely in the setting of known pulmonary disease. Patient with leukocytosis at admission, treated for presumed community acquired pneumonia with IV ceftriaxone. ON 7/5, blood cultures identified ESBL E coli bacteremia and treatemnt was escalated to meropenem. Patient completed COPD exacerbation course on 7/7. Now managing chronic COPD and OSA. Today 7/9: - on RA with 95% saturations on exam - continues: - Duonebs q4HRs - Albuterol inhaler q4h PRN - Aformoterol nebulizer BID - Yupleri nebulizer daily - Budesonide nebulizer daily COPD exacerbation, treatment completed: - Prednisone 40mg  daily (7/3- 7/7)** - PO azitromycin 500 mg (7/3 - 7/7)** - IV ceftriaxone 1 gram (7/3, d/c 7/6 after blood cultures + for ESBL bacteremia)  Constipation Patient with hx of no BM and abdominal pain for a week on admission. Pain resolved after Miralax and Senna-S therapy. Last BM on 7/8.  - Continue Senna-S and Miralax  CKD, 3a Baseline 1-1.1.  - No labs today  ?Peripheral neuropathy Intermitted tingling in bilateral feet. No history of PAD with good distal pulses. No hx of DM, A1c 5.8%. Pending vit 12. Follow outpatient.   HDL Stable. Continued on home pravastatin.   Obesity, class III - Manage outpatient with referral to nutrition and weigh loss clinic  Tobacco use disorder - Nicotine patch  Diet: regular VTE: lovenox Code: FULL PT/OT recs: outpatient PT with rollator (4 wheels). No OT follow up   Dispo: Home with PICC and IV antibiotics until 05/14/2022 with [facility] nursing, pending.  05/16/2022, MD Internal Medicine Resident PGY-1 Please contact the on call pager after 5 pm and on weekends at [phone].

## 2022-05-09 ENCOUNTER — Inpatient Hospital Stay: Payer: Self-pay

## 2022-05-09 ENCOUNTER — Other Ambulatory Visit (HOSPITAL_COMMUNITY): Payer: Self-pay

## 2022-05-09 DIAGNOSIS — B9620 Unspecified Escherichia coli [E. coli] as the cause of diseases classified elsewhere: Secondary | ICD-10-CM | POA: Diagnosis not present

## 2022-05-09 DIAGNOSIS — G4733 Obstructive sleep apnea (adult) (pediatric): Secondary | ICD-10-CM | POA: Diagnosis not present

## 2022-05-09 DIAGNOSIS — Z1612 Extended spectrum beta lactamase (ESBL) resistance: Secondary | ICD-10-CM | POA: Diagnosis not present

## 2022-05-09 DIAGNOSIS — D72829 Elevated white blood cell count, unspecified: Secondary | ICD-10-CM | POA: Diagnosis not present

## 2022-05-09 LAB — CBC WITH DIFFERENTIAL/PLATELET
Abs Immature Granulocytes: 0.09 10*3/uL — ABNORMAL HIGH (ref 0.00–0.07)
Basophils Absolute: 0.1 10*3/uL (ref 0.0–0.1)
Basophils Relative: 1 %
Eosinophils Absolute: 0.3 10*3/uL (ref 0.0–0.5)
Eosinophils Relative: 2 %
HCT: 50 % — ABNORMAL HIGH (ref 36.0–46.0)
Hemoglobin: 15.6 g/dL — ABNORMAL HIGH (ref 12.0–15.0)
Immature Granulocytes: 1 %
Lymphocytes Relative: 19 %
Lymphs Abs: 2.5 10*3/uL (ref 0.7–4.0)
MCH: 27.1 pg (ref 26.0–34.0)
MCHC: 31.2 g/dL (ref 30.0–36.0)
MCV: 86.8 fL (ref 80.0–100.0)
Monocytes Absolute: 0.9 10*3/uL (ref 0.1–1.0)
Monocytes Relative: 7 %
Neutro Abs: 9.3 10*3/uL — ABNORMAL HIGH (ref 1.7–7.7)
Neutrophils Relative %: 70 %
Platelets: 261 10*3/uL (ref 150–400)
RBC: 5.76 MIL/uL — ABNORMAL HIGH (ref 3.87–5.11)
RDW: 19 % — ABNORMAL HIGH (ref 11.5–15.5)
WBC: 13.2 10*3/uL — ABNORMAL HIGH (ref 4.0–10.5)
nRBC: 0 % (ref 0.0–0.2)

## 2022-05-09 LAB — BASIC METABOLIC PANEL
Anion gap: 11 (ref 5–15)
BUN: 17 mg/dL (ref 8–23)
CO2: 27 mmol/L (ref 22–32)
Calcium: 9 mg/dL (ref 8.9–10.3)
Chloride: 98 mmol/L (ref 98–111)
Creatinine, Ser: 0.96 mg/dL (ref 0.44–1.00)
GFR, Estimated: >60 mL/min (ref >60)
Glucose, Bld: 102 mg/dL — ABNORMAL HIGH (ref 70–99)
Potassium: 4.2 mmol/L (ref 3.5–5.1)
Sodium: 136 mmol/L (ref 135–145)

## 2022-05-09 MED ORDER — ERTAPENEM IV (FOR PTA / DISCHARGE USE ONLY): 1.0000 g | INTRAVENOUS | 0 refills | Status: AC

## 2022-05-09 MED ORDER — ALBUTEROL SULFATE HFA 108 (90 BASE) MCG/ACT IN AERS
2.0000 | INHALATION_SPRAY | RESPIRATORY_TRACT | 2 refills | Status: DC | PRN
  Filled 2022-05-09 – 2022-05-10 (×2): qty 18, 30d supply, fill #0

## 2022-05-09 MED ORDER — POLYETHYLENE GLYCOL 3350 17 GM/SCOOP PO POWD
17.0000 g | Freq: Every day | ORAL | 0 refills | Status: DC
  Filled 2022-05-09: qty 238, 14d supply, fill #0

## 2022-05-09 MED ORDER — SENNOSIDES-DOCUSATE SODIUM 8.6-50 MG PO TABS
1.0000 | ORAL_TABLET | Freq: Every evening | ORAL | 0 refills | Status: AC | PRN
  Filled 2022-05-09: qty 2, 2d supply, fill #0

## 2022-05-09 MED ORDER — UMECLIDINIUM-VILANTEROL 62.5-25 MCG/ACT IN AEPB
1.0000 | INHALATION_SPRAY | Freq: Every day | RESPIRATORY_TRACT | 0 refills | Status: AC
Start: 2022-05-09 — End: 2022-07-09
  Filled 2022-05-09: qty 60, 30d supply, fill #0
  Filled 2022-05-10: qty 60, 60d supply, fill #0

## 2022-05-09 MED ORDER — SODIUM CHLORIDE 0.9 % IV SOLN
1.0000 g | INTRAVENOUS | Status: DC
  Administered 2022-05-09: 1000 mg via INTRAVENOUS
  Filled 2022-05-09: qty 1

## 2022-05-09 NOTE — TOC Transition Note (Signed)
Transition of Care [facility] - CM/SW Discharge Note   Patient Details  Name: ARANZA GEDDES MRN: [id number] Date of Birth: 26-Mar-1961  Transition of Care [facility]) CM/SW Contact:  Harriet Masson, RN Phone Number: 05/09/2022, 12:02 PM   Clinical Narrative:    Patient stable for discharge. [facility], home infusion agency, will come out tomorrow to administer IV antibiotics.  Patient states her sister or husband will transport her home. PCP apt made and onAVS. Outpatient rehab referral done. Final next level of care: Home w Home Health Services Barriers to Discharge: Barriers Resolved   Patient Goals and CMS Choice Patient states their goals for this hospitalization and ongoing recovery are:: return home      Discharge Placement                 home      Discharge Plan and Services   Discharge Planning Services: CM Consult Post Acute Care Choice: Durable Medical Equipment          DME Arranged: 3-N-1, Shower stool, Walker rolling with seat DME Agency: [employer] Date DME Agency Contacted: 05/04/22 Time DME Agency Contacted: [phone] Representative spoke with at DME Agency: Suzanna Obey   [facility] Agency: Other - See comment Judee Clara) Date HH Agency Contacted: 05/09/22 Time HH Agency Contacted: [phone] Representative spoke with at [facility] Agency: [place]  Social Determinants of Health (SDOH) Interventions     Readmission Risk Interventions    05/09/2022   12:02 PM  Readmission Risk Prevention Plan  Post Dischage Appt Complete  Medication Screening Complete  Transportation Screening Complete

## 2022-05-09 NOTE — Discharge Instructions (Signed)
Dear Ms. Schrade,  You came to the hospital because of shortness of breath. We admitted you because you were having trouble breathing and needing a lot more oxygen than you usually need at home. We initially treated this with COPD medications, antibiotics, and steroids, which helped you. You also had a fever and found that your white blood cell count was elevated. After taking a picture of your abdomen showed diverticulosis, an outpouching of your intestines, and some thickening that suggested inflammation. We got blood cultures and it showed an infection with a bacteria called ESBL E Coli. Because of this, you will continue on IV antibiotics until 05/14/2022. You are stable and safe for discharge today. We have arranged a nurse to come to your home and help with the IV antibiotic administration.   You will need to follow up with a new primary care provider at the [facility] Internal Medicine clinic on May 17, 2022 at 8:45 am. Please schedule an appointment with [facility] on [address] at [phone] to gain strength after your hospitalization.   [facility], home infusion agency, will come out tomorrow to administer IV antibiotics on 05/10/2022  Take care, Your [facility] inpatient team

## 2022-05-10 ENCOUNTER — Other Ambulatory Visit (HOSPITAL_COMMUNITY): Payer: Self-pay

## 2022-05-10 ENCOUNTER — Other Ambulatory Visit: Payer: Self-pay | Admitting: *Deleted

## 2022-05-10 DIAGNOSIS — D751 Secondary polycythemia: Secondary | ICD-10-CM | POA: Diagnosis not present

## 2022-05-10 DIAGNOSIS — B9620 Unspecified Escherichia coli [E. coli] as the cause of diseases classified elsewhere: Secondary | ICD-10-CM | POA: Diagnosis not present

## 2022-05-10 DIAGNOSIS — R7881 Bacteremia: Secondary | ICD-10-CM | POA: Diagnosis not present

## 2022-05-10 MED ORDER — BUDESONIDE 0.5 MG/2ML IN SUSP: 0.5000 mg | Freq: Two times a day (BID) | RESPIRATORY_TRACT | 1 refills | Status: DC

## 2022-05-10 MED ORDER — AMLODIPINE BESYLATE 5 MG PO TABS: 5.0000 mg | ORAL_TABLET | Freq: Every day | ORAL | 0 refills | Status: DC

## 2022-05-10 MED ORDER — NICOTINE 14 MG/24HR TD PT24
14.0000 mg | MEDICATED_PATCH | Freq: Every day | TRANSDERMAL | 0 refills | Status: DC
Start: 2022-05-10 — End: 2022-06-27

## 2022-05-10 MED ORDER — PRAVASTATIN SODIUM 20 MG PO TABS: 20.0000 mg | ORAL_TABLET | Freq: Every day | ORAL | 0 refills | Status: DC

## 2022-05-10 NOTE — Telephone Encounter (Signed)
Call from J.  Futrell from [facility] out with patient for Infusion care.  Patient is in need of refills on some of her discharge meds.  Has an appointment scheduled for next Tuesday.

## 2022-05-11 DIAGNOSIS — R7881 Bacteremia: Secondary | ICD-10-CM | POA: Diagnosis not present

## 2022-05-11 DIAGNOSIS — B9620 Unspecified Escherichia coli [E. coli] as the cause of diseases classified elsewhere: Secondary | ICD-10-CM | POA: Diagnosis not present

## 2022-05-12 DIAGNOSIS — R7881 Bacteremia: Secondary | ICD-10-CM | POA: Diagnosis not present

## 2022-05-12 DIAGNOSIS — B9620 Unspecified Escherichia coli [E. coli] as the cause of diseases classified elsewhere: Secondary | ICD-10-CM | POA: Diagnosis not present

## 2022-05-13 DIAGNOSIS — B9620 Unspecified Escherichia coli [E. coli] as the cause of diseases classified elsewhere: Secondary | ICD-10-CM | POA: Diagnosis not present

## 2022-05-13 DIAGNOSIS — R7881 Bacteremia: Secondary | ICD-10-CM | POA: Diagnosis not present

## 2022-05-14 DIAGNOSIS — B9620 Unspecified Escherichia coli [E. coli] as the cause of diseases classified elsewhere: Secondary | ICD-10-CM | POA: Diagnosis not present

## 2022-05-14 DIAGNOSIS — R7881 Bacteremia: Secondary | ICD-10-CM | POA: Diagnosis not present

## 2022-05-17 ENCOUNTER — Encounter: Payer: Self-pay | Admitting: Student

## 2022-05-17 ENCOUNTER — Ambulatory Visit (INDEPENDENT_AMBULATORY_CARE_PROVIDER_SITE_OTHER): Payer: Medicaid Other | Admitting: Student

## 2022-05-17 VITALS — BP 144/80 | HR 79 | Temp 98.1°F | Wt 260.7 lb

## 2022-05-17 DIAGNOSIS — J9611 Chronic respiratory failure with hypoxia: Secondary | ICD-10-CM | POA: Diagnosis not present

## 2022-05-17 DIAGNOSIS — I208 Other forms of angina pectoris: Secondary | ICD-10-CM

## 2022-05-17 DIAGNOSIS — I7143 Infrarenal abdominal aortic aneurysm, without rupture: Secondary | ICD-10-CM | POA: Diagnosis not present

## 2022-05-17 DIAGNOSIS — F17210 Nicotine dependence, cigarettes, uncomplicated: Secondary | ICD-10-CM | POA: Diagnosis not present

## 2022-05-17 DIAGNOSIS — I2089 Other forms of angina pectoris: Secondary | ICD-10-CM

## 2022-05-17 DIAGNOSIS — I10 Essential (primary) hypertension: Secondary | ICD-10-CM

## 2022-05-17 DIAGNOSIS — J9610 Chronic respiratory failure, unspecified whether with hypoxia or hypercapnia: Secondary | ICD-10-CM | POA: Insufficient documentation

## 2022-05-17 DIAGNOSIS — Z87898 Personal history of other specified conditions: Secondary | ICD-10-CM | POA: Diagnosis not present

## 2022-05-17 LAB — CBC WITH DIFFERENTIAL/PLATELET
Abs Immature Granulocytes: 0.05 10*3/uL (ref 0.00–0.07)
Basophils Absolute: 0.1 10*3/uL (ref 0.0–0.1)
Basophils Relative: 1 %
Eosinophils Absolute: 0.3 10*3/uL (ref 0.0–0.5)
Eosinophils Relative: 2 %
HCT: 54.4 % — ABNORMAL HIGH (ref 36.0–46.0)
Hemoglobin: 16.5 g/dL — ABNORMAL HIGH (ref 12.0–15.0)
Immature Granulocytes: 1 %
Lymphocytes Relative: 21 %
Lymphs Abs: 2.2 10*3/uL (ref 0.7–4.0)
MCH: 27.3 pg (ref 26.0–34.0)
MCHC: 30.3 g/dL (ref 30.0–36.0)
MCV: 90.1 fL (ref 80.0–100.0)
Monocytes Absolute: 0.8 10*3/uL (ref 0.1–1.0)
Monocytes Relative: 8 %
Neutro Abs: 7.3 10*3/uL (ref 1.7–7.7)
Neutrophils Relative %: 67 %
Platelets: 348 10*3/uL (ref 150–400)
RBC: 6.04 MIL/uL — ABNORMAL HIGH (ref 3.87–5.11)
RDW: 18.9 % — ABNORMAL HIGH (ref 11.5–15.5)
WBC: 10.7 10*3/uL — ABNORMAL HIGH (ref 4.0–10.5)
nRBC: 0 % (ref 0.0–0.2)

## 2022-05-17 LAB — COMPREHENSIVE METABOLIC PANEL
ALT: 18 U/L (ref 0–44)
AST: 16 U/L (ref 15–41)
Albumin: 3.2 g/dL — ABNORMAL LOW (ref 3.5–5.0)
Alkaline Phosphatase: 85 U/L (ref 38–126)
Anion gap: 7 (ref 5–15)
BUN: 7 mg/dL — ABNORMAL LOW (ref 8–23)
CO2: 28 mmol/L (ref 22–32)
Calcium: 9.4 mg/dL (ref 8.9–10.3)
Chloride: 102 mmol/L (ref 98–111)
Creatinine, Ser: 1.14 mg/dL — ABNORMAL HIGH (ref 0.44–1.00)
GFR, Estimated: 55 mL/min — ABNORMAL LOW (ref >60)
Glucose, Bld: 97 mg/dL (ref 70–99)
Potassium: 4 mmol/L (ref 3.5–5.1)
Sodium: 137 mmol/L (ref 135–145)
Total Bilirubin: 1.1 mg/dL (ref 0.3–1.2)
Total Protein: 7.7 g/dL (ref 6.5–8.1)

## 2022-05-17 NOTE — Assessment & Plan Note (Addendum)
Patient reports that since she has been discharged from the hospital she has been having typical chest pain upon exertion.  This has been going on for the past week.  She states that she also becomes short of breath.  Her chest pain is a dull achy pain which resolves with rest.  Patient has never had a cardiac work-up. Giver her family history of CAD in her mother and her current chest pain along with her other co morbidities, I feel that patient would benefit from evaluation from a cardiologist.   Plan: - Referred to cardiology for stress test - Not inclined to do an EKG at this time as this sounds like stable angina

## 2022-05-17 NOTE — Assessment & Plan Note (Signed)
Patient was recently hospitalized on 07/03 and discharged on 07/10 for bacteremia.  This bacteremia was likely from her diverticulosis.  Patient was initially on ceftriaxone, and transitioned to meropenem.  She was then discharged on ertapenem IV until 07/15.  She was discharged on a PICC line.  Discharging physician requested patient follow-up with PCP to get a CBC and a CMP.  This is likely because on admission, white count was trending down, and kidney function was getting back to baseline, and this is to ensure the white count resolved.  Patient successfully completed antibiotic treatment.  Patient has no concerns today.  Plan: - PICC line removed today given IV antibiotics have been completed - CBC showing resolved white count of 10.7.  This is down from 13.2. - CMP showing kidney function back to baseline

## 2022-05-17 NOTE — Progress Notes (Signed)
CC: Chest pain  HPI:  [name] is a 61 y.o. female with a past medical history of chronic respiratory failure, hyperlipidemia, hypertension, and suspected COPD.  Patient was recently discharged from the hospital for bacteremia.  Patient had a 7-day stay from 07/03 to 07/10 in which she was treated for bacteremia.  Patient presents here today to follow-up for that stay.  Patient states that since that visit, she has become more short of breath.  She also developed some chest pain with exertion.  She describes this to be a dull achy pain with exertion.  It becomes better with rest.  She denies any history of a cardiac work-up.  She reports that other than that, she has been feeling good.  She finished her antibiotics and would like to get her PICC line out.  History: Medical: Suspected COPD, hyperlipidemia, hypertension, chronic respiratory failure Family: Mother with CAD Surgical: Salpingectomy Social: Patient reports a 30-year history of tobacco smoking.  She reports smoking 1 pack/day.  No alcohol use.  No drug use.  Patient denies any exercise.  Past Medical History:  Diagnosis Date   COPD (chronic obstructive pulmonary disease) (HCC)    High cholesterol    Hypertension    Smoker      Current Outpatient Medications:    acetaminophen (TYLENOL) 500 MG tablet, Take 500 mg by mouth 3 (three) times daily as needed (pain)., Disp: , Rfl:    albuterol (VENTOLIN HFA) 108 (90 Base) MCG/ACT inhaler, Inhale 2 puffs into the lungs every 4 (four) hours as needed for wheezing or shortness of breath., Disp: 18 g, Rfl: 2   amLODipine (NORVASC) 5 MG tablet, Take 1 tablet (5 mg total) by mouth daily., Disp: 90 tablet, Rfl: 0   budesonide (PULMICORT) 0.5 MG/2ML nebulizer solution, Take 2 mLs (0.5 mg total) by nebulization 2 (two) times daily., Disp: 60 mL, Rfl: 1   Calcium Carbonate Antacid (TUMS PO), Take 2 tablets by mouth 2 (two) times daily as needed (heartburn)., Disp: , Rfl:    nicotine  (NICODERM CQ - DOSED IN MG/24 HOURS) 14 mg/24hr patch, Place 1 patch (14 mg total) onto the skin daily., Disp: 28 patch, Rfl: 0   polyethylene glycol powder (GLYCOLAX/MIRALAX) 17 GM/SCOOP powder, Take 17 g by mouth daily., Disp: 238 g, Rfl: 0   pravastatin (PRAVACHOL) 20 MG tablet, Take 1 tablet (20 mg total) by mouth at bedtime., Disp: 90 tablet, Rfl: 0   senna-docusate (SENOKOT-S) 8.6-50 MG tablet, Take 1 tablet by mouth at bedtime as needed for mild constipation., Disp: 2 tablet, Rfl: 0   umeclidinium-vilanterol (ANORO ELLIPTA) 62.5-25 MCG/ACT AEPB, Inhale 1 puff into the lungs daily., Disp: 60 each, Rfl: 0  Review of Systems:   Respiratory: Patient endorses shortness of breath  Cardiovascular:Patient endorses chest pain on exertion   Physical Exam:  Vitals:   05/17/22 0840 05/17/22 0959  BP: (!) 150/80 (!) 144/80  Pulse: 81 79  Temp: 98.1 F (36.7 C)   TempSrc: Oral   SpO2: (!) 88% 97%  Weight: 260 lb 11.2 oz (118.3 kg)    General: Alert and orientated x3. Patient is sitting comfortably in the room on 3L O2 nasal cannula   Eyes: EOM intact  Head: Normocephalic, atraumatic  Cardio: Regular rate and rhythm, no murmurs, rubs or gallops. 2+ pulses to bilateral upper and lower extremities  Pulmonary: Expiratory wheezing noted to bilaterally lower bases. Extremities: Left arm with midline at antecubital fossa. No edema noted to bilateral lower extremities  Assessment & Plan:   Infrarenal abdominal aortic aneurysm (AAA) without rupture (HCC) An incidental infrarenal aortic aneurysm was found on CT during her hospitalization.  This aneurysm is stable at this time.  Plan: - Follow-up with ultrasound in 3 years - Plan is to maintain good blood pressure control   Stable angina [facility]) Patient reports that since she has been discharged from the hospital she has been having typical chest pain upon exertion.  This has been going on for the past week.  She states that she also becomes  short of breath.  Her chest pain is a dull achy pain which resolves with rest.  Patient has never had a cardiac work-up. Giver her family history of CAD in her mother and her current chest pain along with her other co morbidities, I feel that patient would benefit from evaluation from a cardiologist.   Plan: - Referred to cardiology for stress test - Not inclined to do an EKG at this time as this sounds like stable angina  Respiratory failure, chronic (HCC) Patient is on 3 L oxygen at home.  She states that she uses it at night.  Patient has a 30+ pack per day history of smoking which likely has contributed to this.  Patient developed complications due to this in the hospital.  She is not followed by pulmonology for this.  Patient's current treatment for her chronic respiratory failure (likely secondary to suspected COPD) is albuterol every 4 hours as needed, Ellipta daily, and budesonide nebulizers twice daily.  Patient was discharged from the hospital with budesonide vials, but she was unable to get a nebulizer.  Patient also requests a full facemask instead of the nasal cannula for her oxygen.  I informed patient that she can contact her oxygen supplier for this.  Patient is agreeable to a sleep study.  Plan: - Refer patient to pulmonology for PFTs to see if patient has underlying COPD - Order DME nebulizer for patient to use at home - Continue albuterol every 4 hours as needed - Continue Ellipta daily - Use budesonide nebulizers twice daily - Sleep study ordered  History of bacteremia Patient was recently hospitalized on 07/03 and discharged on 07/10 for bacteremia.  This bacteremia was likely from her diverticulosis.  Patient was initially on ceftriaxone, and transitioned to meropenem.  She was then discharged on ertapenem IV until 07/15.  She was discharged on a PICC line.  Discharging physician requested patient follow-up with PCP to get a CBC and a CMP.  This is likely because on admission,  white count was trending down, and kidney function was getting back to baseline, and this is to ensure the white count resolved.  Patient successfully completed antibiotic treatment.  Patient has no concerns today.  Plan: - PICC line removed today given IV antibiotics have been completed - CBC showing resolved white count of 10.7.  This is down from 13.2. - CMP showing kidney function back to baseline     Essential hypertension Patient presents to clinic with elevated blood pressure in the 150s.  On recheck the blood pressure is 144/80.  Patient currently is on amlodipine 5 mg daily.  I recommend the patient to increase amlodipine to 10 mg daily.  Patient denies this given that she wants to see if the blood pressure comes down with lifestyle modifications.  I informed the patient about the importance of good blood pressure control given that she has an aneurysm.  Patient is understanding of this.  Plan: -  Continue amlodipine 5 mg daily - Please follow-up in 1 month regarding your blood pressure - Keep blood pressure log at home   Patient seen with Dr.  Jacolyn Reedy, DO PGY-1 Internal Medicine Resident  Pager: [phone]

## 2022-05-17 NOTE — Progress Notes (Signed)
[facility] RN called to internal med center to remove patient's PICC. Upon arrival at bedside noted that line in patient's left upper arm was a midline. Attempted to draw labs from midline unsuccessful. Removed midline without difficulty. Vaseline gauze, gauze and tegaderm placed over site after holding pressure for one minute. No bleeding or swelling noted. Patient educated to keep dressing dry and intact for 24 hours. Patient verbalized understanding.

## 2022-05-17 NOTE — Assessment & Plan Note (Addendum)
Patient is on 3 L oxygen at home.  She states that she uses it at night.  Patient has a 30+ pack per day history of smoking which likely has contributed to this.  Patient developed complications due to this in the hospital.  She is not followed by pulmonology for this.  Patient's current treatment for her chronic respiratory failure (likely secondary to suspected COPD) is albuterol every 4 hours as needed, Ellipta daily, and budesonide nebulizers twice daily.  Patient was discharged from the hospital with budesonide vials, but she was unable to get a nebulizer.  Patient also requests a full facemask instead of the nasal cannula for her oxygen.  I informed patient that she can contact her oxygen supplier for this.  Patient is agreeable to a sleep study.  Plan: - Refer patient to pulmonology for PFTs to see if patient has underlying COPD - Order DME nebulizer for patient to use at home - Continue albuterol every 4 hours as needed - Continue Ellipta daily - Use budesonide nebulizers twice daily - Sleep study ordered

## 2022-05-17 NOTE — Patient Instructions (Addendum)
Kim [name],Thank you for allowing me to take part in your care today.  Here are your instructions.  1.  We will continue to monitor your blood pressure.  I want you to keep a log.  I want you to continue taking your amlodipine 5 mg daily.  Please go to your local drugstore, to either buy a blood pressure cuff or to use their blood pressure machine.  Please keep a log.  2.  I have referred you to a lung doctor, please await a phone call from them.  Please continue to take your Ellipta daily.  Please take your budesonide nebulizers twice daily.  Use albuterol only when needed.  3.  I have referred you to a heart doctor for your chest pain.  Please await a phone call from them.  If this chest pain continues, and becomes worse, or does not resolve and is always present, please call us.  4.  I have referred you to a sleep study.  Please await a phone call from them.   5.  I will look into getting you a nebulizer machine as well as a full facemask for your oxygen.  Please continue to use your nasal cannula for your oxygen until we get your mask.   Thank you, Dr. Allena Katz  If you have any other questions please contact the internal medicine clinic at [phone]

## 2022-05-17 NOTE — Assessment & Plan Note (Signed)
An incidental infrarenal aortic aneurysm was found on CT during her hospitalization.  This aneurysm is stable at this time.  Plan: - Follow-up with ultrasound in 3 years - Plan is to maintain good blood pressure control

## 2022-05-17 NOTE — Assessment & Plan Note (Signed)
Patient presents to clinic with elevated blood pressure in the 150s.  On recheck the blood pressure is 144/80.  Patient currently is on amlodipine 5 mg daily.  I recommend the patient to increase amlodipine to 10 mg daily.  Patient denies this given that she wants to see if the blood pressure comes down with lifestyle modifications.  I informed the patient about the importance of good blood pressure control given that she has an aneurysm.  Patient is understanding of this.  Plan: - Continue amlodipine 5 mg daily - Please follow-up in 1 month regarding your blood pressure - Keep blood pressure log at home

## 2022-05-18 ENCOUNTER — Other Ambulatory Visit: Payer: Self-pay | Admitting: Student in an Organized Health Care Education/Training Program

## 2022-05-19 ENCOUNTER — Ambulatory Visit: Payer: Medicaid Other | Attending: Internal Medicine

## 2022-05-19 DIAGNOSIS — J9601 Acute respiratory failure with hypoxia: Secondary | ICD-10-CM | POA: Diagnosis not present

## 2022-05-19 DIAGNOSIS — R531 Weakness: Secondary | ICD-10-CM | POA: Diagnosis not present

## 2022-05-19 NOTE — Addendum Note (Signed)
Addended by: Dickie La on: 05/19/2022 09:17 AM   Modules accepted: Level of Service

## 2022-05-19 NOTE — Progress Notes (Signed)
Internal Medicine Clinic Attending  I saw and evaluated the patient.  I personally confirmed the key portions of the history and exam documented by Dr. Allena Katz and I reviewed pertinent patient test results.  The assessment, diagnosis, and plan were formulated together and I agree with the documentation in the resident's note. Completed abx course for E coli bacteremia, midline removed today by IV team. Kim [name] has not had regular medical care for many years and will need continued f/u to get caught up. Referral to cardiology given concern for angina sx, recent EKG in the hospital without ischemic changes. Referral for PFTs to confirm suspected diagnosis of COPD, she remains on LAMA-LABA-ICS therapy with albuterol rescue. Will also benefit from sleep study to evaluate need for NIPPV.

## 2022-05-24 ENCOUNTER — Other Ambulatory Visit: Payer: Self-pay | Admitting: Student

## 2022-05-24 DIAGNOSIS — J9611 Chronic respiratory failure with hypoxia: Secondary | ICD-10-CM

## 2022-05-31 DIAGNOSIS — Z419 Encounter for procedure for purposes other than remedying health state, unspecified: Secondary | ICD-10-CM | POA: Diagnosis not present

## 2022-06-17 ENCOUNTER — Ambulatory Visit (INDEPENDENT_AMBULATORY_CARE_PROVIDER_SITE_OTHER): Payer: Medicaid Other | Admitting: Internal Medicine

## 2022-06-17 ENCOUNTER — Encounter: Payer: Self-pay | Admitting: Internal Medicine

## 2022-06-17 ENCOUNTER — Other Ambulatory Visit: Payer: Self-pay

## 2022-06-17 VITALS — BP 143/67 | HR 72 | Temp 97.9°F | Ht 66.0 in | Wt 260.0 lb

## 2022-06-17 DIAGNOSIS — I10 Essential (primary) hypertension: Secondary | ICD-10-CM | POA: Diagnosis not present

## 2022-06-17 DIAGNOSIS — F17210 Nicotine dependence, cigarettes, uncomplicated: Secondary | ICD-10-CM

## 2022-06-17 DIAGNOSIS — L249 Irritant contact dermatitis, unspecified cause: Secondary | ICD-10-CM | POA: Insufficient documentation

## 2022-06-17 DIAGNOSIS — Z139 Encounter for screening, unspecified: Secondary | ICD-10-CM | POA: Insufficient documentation

## 2022-06-17 DIAGNOSIS — Z1231 Encounter for screening mammogram for malignant neoplasm of breast: Secondary | ICD-10-CM | POA: Insufficient documentation

## 2022-06-17 MED ORDER — TRIAMCINOLONE ACETONIDE 0.025 % EX OINT: 1.0000 | TOPICAL_OINTMENT | Freq: Two times a day (BID) | CUTANEOUS | 0 refills | Status: AC | PRN

## 2022-06-17 MED ORDER — AMLODIPINE BESYLATE 10 MG PO TABS: 10.0000 mg | ORAL_TABLET | Freq: Every day | ORAL | 11 refills | Status: DC

## 2022-06-17 NOTE — Assessment & Plan Note (Signed)
Patient complains of skin redness and irritation with itch since her PICC line was removed at last OV. It was never painful and at this time is not itchy or painful but does intermittently itch. She denies fever, chills. Plan:Triamcinolone cream sent in for PRN use for itching.

## 2022-06-17 NOTE — Assessment & Plan Note (Signed)
Due for screening mammogram. Plan:Order placed.

## 2022-06-17 NOTE — Assessment & Plan Note (Signed)
Patient presents for BP follow-up. BP is uncontrolled today, 152/58 with repeat of 143/67. Current regimen of amlodipine 5 mg daily. She does not have a home BP cuff to check readings. Plan:Increase amlodipine to 10 mg daily.

## 2022-06-17 NOTE — Progress Notes (Signed)
   CC: BP follow-up, arm rash  HPI:  [name] is a 61 y.o. person with past medical history as detailed below who presents today for BP follow-up and with complaint of L arm rash. Please see problem based charting for detailed assessment and plan.  Past Medical History:  Diagnosis Date   COPD (chronic obstructive pulmonary disease) (HCC)    High cholesterol    Hypertension    Smoker    Review of Systems:  Negative unless otherwise stated.  Physical Exam:  Vitals:   06/17/22 0945 06/17/22 1014  BP: (!) 152/58 (!) 143/67  Pulse: 81 72  Temp: 97.9 F (36.6 C)   TempSrc: Oral   SpO2: 98%   Weight: 260 lb (117.9 kg)   Height: 5\' 6"  (1.676 m)    Constitutional:Appears older than stated age. In no acute distress. Cardio:Regular rate and rhythm. Pulm:Normal work of breathing on room air. for extremity edema. Skin:Skin is warm and dry, to point of flaking/ashy. Poorly demarcated pink-red rash over mid-upper LUE without lesions, edema, increased warmth, tenderness to palpation. Neuro:Alert and oriented x3. No focal deficit noted. Psych:Normal mood and affect.  Assessment & Plan:   See Encounters Tab for problem based charting.  Essential hypertension Patient presents for BP follow-up. BP is uncontrolled today, 152/58 with repeat of 143/67. Current regimen of amlodipine 5 mg daily. She does not have a home BP cuff to check readings. Plan:Increase amlodipine to 10 mg daily.  Irritant contact dermatitis Patient complains of skin redness and irritation with itch since her PICC line was removed at last OV. It was never painful and at this time is not itchy or painful but does intermittently itch. She denies fever, chills. Plan:Triamcinolone cream sent in for PRN use for itching.  Encounter for screening mammogram for breast cancer Due for screening mammogram. Plan:Order placed.  Patient discussed with Dr.  NFA:[id number]

## 2022-06-17 NOTE — Patient Instructions (Signed)
Ms. Berthelot,  It was nice seeing you today! Thank you for choosing [facility] for your Primary Care.    Today we talked about:   Blood pressure: Your blood pressure is still a little high. I am going to increase your dose of amlodipine to 10 mg daily. I think this will get it under control! You can take two of your current 5 mg dose tablets until they run out, then pick up the new prescription and take only 1 10 mg tablet. Arm rash: I have sent in a cream to help with the itching whenever it comes on.  You are overdue for a routine mammogram so I have placed this order. They will call you to get this set up.  Please follow-up in 2 months.  My best, Dr. August Saucer

## 2022-06-20 NOTE — Progress Notes (Signed)
Internal Medicine Clinic Attending ? ?Case discussed with Dr. Dean  At the time of the visit.  We reviewed the resident?s history and exam and pertinent patient test results.  I agree with the assessment, diagnosis, and plan of care documented in the resident?s note.  ?

## 2022-06-27 ENCOUNTER — Other Ambulatory Visit: Payer: Self-pay | Admitting: Student in an Organized Health Care Education/Training Program

## 2022-06-27 ENCOUNTER — Telehealth: Payer: Self-pay

## 2022-06-27 DIAGNOSIS — I10 Essential (primary) hypertension: Secondary | ICD-10-CM

## 2022-06-27 MED ORDER — AMLODIPINE BESYLATE 10 MG PO TABS: 10.0000 mg | ORAL_TABLET | Freq: Every day | ORAL | 0 refills | Status: DC

## 2022-06-27 NOTE — Telephone Encounter (Signed)
Called Ms. Tait regarding amlodipine. Her dose of amlodipine was recently increased to 10mg . Since that time she has experienced bilateral tinnitus, which has been bothersome. Otherwise no new symptoms. Discussed with Ms. Brucato that she could re-start amlodipine 5mg  once daily and return to clinic in a few weeks. She verbalized understanding.  , MD Internal Medicine PGY-3 Pager: [phone]

## 2022-06-27 NOTE — Telephone Encounter (Signed)
Questions about taking amLODipine (NORVASC) 10 MG tablet, please call pt back.

## 2022-07-01 DIAGNOSIS — Z419 Encounter for procedure for purposes other than remedying health state, unspecified: Secondary | ICD-10-CM | POA: Diagnosis not present

## 2022-07-14 ENCOUNTER — Ambulatory Visit: Payer: Medicaid Other | Attending: Cardiology | Admitting: Cardiology

## 2022-07-18 ENCOUNTER — Encounter: Payer: Medicaid Other | Admitting: Student

## 2022-07-19 ENCOUNTER — Other Ambulatory Visit: Payer: Self-pay | Admitting: Student in an Organized Health Care Education/Training Program

## 2022-07-31 DIAGNOSIS — Z419 Encounter for procedure for purposes other than remedying health state, unspecified: Secondary | ICD-10-CM | POA: Diagnosis not present

## 2022-08-02 ENCOUNTER — Other Ambulatory Visit: Payer: Self-pay | Admitting: Student

## 2022-08-02 MED ORDER — BUDESONIDE 0.5 MG/2ML IN SUSP: 0.5000 mg | Freq: Every day | RESPIRATORY_TRACT | 1 refills | Status: DC

## 2022-08-02 NOTE — Telephone Encounter (Signed)
Yes I can order this as a 90 day prescription.

## 2022-08-08 ENCOUNTER — Other Ambulatory Visit: Payer: Self-pay | Admitting: Student in an Organized Health Care Education/Training Program

## 2022-08-09 ENCOUNTER — Other Ambulatory Visit: Payer: Self-pay | Admitting: Student

## 2022-08-09 DIAGNOSIS — I10 Essential (primary) hypertension: Secondary | ICD-10-CM

## 2022-08-09 MED ORDER — AMLODIPINE BESYLATE 5 MG PO TABS: 5.0000 mg | ORAL_TABLET | Freq: Every day | ORAL | 1 refills | Status: DC

## 2022-08-09 NOTE — Progress Notes (Signed)
Refilled patient amlodipine 5 mg daily. Patient was on 10 mg and was not able to tolerate this per documentation. Will resume back 5mg  for now. Patient has an appointment with me in 2 weeks, and at that time I will reassess the patient and her blood pressure if there needs to be changes to her medication regiment for blood pressure control.

## 2022-08-12 ENCOUNTER — Other Ambulatory Visit: Payer: Self-pay

## 2022-08-12 DIAGNOSIS — I10 Essential (primary) hypertension: Secondary | ICD-10-CM

## 2022-08-12 MED ORDER — AMLODIPINE BESYLATE 5 MG PO TABS: 5.0000 mg | ORAL_TABLET | Freq: Every day | ORAL | 1 refills | Status: AC

## 2022-08-22 ENCOUNTER — Encounter: Payer: Self-pay | Admitting: Student

## 2022-08-22 ENCOUNTER — Encounter: Payer: Medicaid Other | Admitting: Student

## 2022-08-31 DIAGNOSIS — Z419 Encounter for procedure for purposes other than remedying health state, unspecified: Secondary | ICD-10-CM | POA: Diagnosis not present

## 2022-09-30 DIAGNOSIS — Z419 Encounter for procedure for purposes other than remedying health state, unspecified: Secondary | ICD-10-CM | POA: Diagnosis not present

## 2022-10-06 ENCOUNTER — Emergency Department (HOSPITAL_COMMUNITY)
Admission: EM | Admit: 2022-10-06 | Discharge: 2022-10-06 | Discharge status: Against Medical Advice | Payer: Medicaid Other | Attending: Emergency Medicine | Admitting: Emergency Medicine

## 2022-10-06 ENCOUNTER — Other Ambulatory Visit: Payer: Self-pay

## 2022-10-06 ENCOUNTER — Emergency Department (HOSPITAL_COMMUNITY): Payer: Medicaid Other

## 2022-10-06 ENCOUNTER — Ambulatory Visit (INDEPENDENT_AMBULATORY_CARE_PROVIDER_SITE_OTHER): Payer: Medicaid Other | Admitting: Internal Medicine

## 2022-10-06 VITALS — BP 126/70 | HR 78 | Temp 97.8°F | Wt 258.3 lb

## 2022-10-06 DIAGNOSIS — R0602 Shortness of breath: Secondary | ICD-10-CM | POA: Insufficient documentation

## 2022-10-06 DIAGNOSIS — J9601 Acute respiratory failure with hypoxia: Secondary | ICD-10-CM

## 2022-10-06 DIAGNOSIS — Z5321 Procedure and treatment not carried out due to patient leaving prior to being seen by health care provider: Secondary | ICD-10-CM | POA: Diagnosis not present

## 2022-10-06 DIAGNOSIS — R531 Weakness: Secondary | ICD-10-CM | POA: Insufficient documentation

## 2022-10-06 LAB — I-STAT VENOUS BLOOD GAS, ED
Acid-Base Excess: 2 mmol/L (ref 0.0–2.0)
Bicarbonate: 29.7 mmol/L — ABNORMAL HIGH (ref 20.0–28.0)
Calcium, Ion: 1.1 mmol/L — ABNORMAL LOW (ref 1.15–1.40)
HCT: 53 % — ABNORMAL HIGH (ref 36.0–46.0)
Hemoglobin: 18 g/dL — ABNORMAL HIGH (ref 12.0–15.0)
O2 Saturation: 100 %
Potassium: 3.7 mmol/L (ref 3.5–5.1)
Sodium: 136 mmol/L (ref 135–145)
TCO2: 32 mmol/L (ref 22–32)
pCO2, Ven: 58.7 mmHg (ref 44–60)
pH, Ven: 7.313 (ref 7.25–7.43)
pO2, Ven: 199 mmHg — ABNORMAL HIGH (ref 32–45)

## 2022-10-06 LAB — BASIC METABOLIC PANEL
Anion gap: 11 (ref 5–15)
BUN: 10 mg/dL (ref 8–23)
CO2: 26 mmol/L (ref 22–32)
Calcium: 9.1 mg/dL (ref 8.9–10.3)
Chloride: 98 mmol/L (ref 98–111)
Creatinine, Ser: 1.1 mg/dL — ABNORMAL HIGH (ref 0.44–1.00)
GFR, Estimated: 57 mL/min — ABNORMAL LOW (ref >60)
Glucose, Bld: 87 mg/dL (ref 70–99)
Potassium: 3.7 mmol/L (ref 3.5–5.1)
Sodium: 135 mmol/L (ref 135–145)

## 2022-10-06 LAB — CBC WITH DIFFERENTIAL/PLATELET
Abs Immature Granulocytes: 0.09 10*3/uL — ABNORMAL HIGH (ref 0.00–0.07)
Basophils Absolute: 0.1 10*3/uL (ref 0.0–0.1)
Basophils Relative: 1 %
Eosinophils Absolute: 0.2 10*3/uL (ref 0.0–0.5)
Eosinophils Relative: 2 %
HCT: 52.3 % — ABNORMAL HIGH (ref 36.0–46.0)
Hemoglobin: 16.8 g/dL — ABNORMAL HIGH (ref 12.0–15.0)
Immature Granulocytes: 1 %
Lymphocytes Relative: 21 %
Lymphs Abs: 2.6 10*3/uL (ref 0.7–4.0)
MCH: 32.2 pg (ref 26.0–34.0)
MCHC: 32.1 g/dL (ref 30.0–36.0)
MCV: 100.4 fL — ABNORMAL HIGH (ref 80.0–100.0)
Monocytes Absolute: 0.8 10*3/uL (ref 0.1–1.0)
Monocytes Relative: 6 %
Neutro Abs: 8.5 10*3/uL — ABNORMAL HIGH (ref 1.7–7.7)
Neutrophils Relative %: 69 %
Platelets: 271 10*3/uL (ref 150–400)
RBC: 5.21 MIL/uL — ABNORMAL HIGH (ref 3.87–5.11)
RDW: 15.3 % (ref 11.5–15.5)
WBC: 12.2 10*3/uL — ABNORMAL HIGH (ref 4.0–10.5)
nRBC: 0 % (ref 0.0–0.2)

## 2022-10-06 LAB — BRAIN NATRIURETIC PEPTIDE: B Natriuretic Peptide: 38.6 pg/mL (ref 0.0–100.0)

## 2022-10-06 MED ORDER — IPRATROPIUM-ALBUTEROL 0.5-2.5 (3) MG/3ML IN SOLN: 3.0000 mL | Freq: Once | RESPIRATORY_TRACT | Status: DC

## 2022-10-06 NOTE — ED Notes (Signed)
Pt informed this RN that she did not want to stay any longer. This RN advised patient of risks of leaving. Pt departs this ER with her family

## 2022-10-06 NOTE — ED Triage Notes (Signed)
Pt presents after coming in for an appt to get nicotine patches prescribed and was told to come to the ED for her [facility].  Pt wears 3L [place] continuously at home.  No complaints at this time.

## 2022-10-06 NOTE — ED Provider Triage Note (Signed)
Emergency Medicine Provider Triage Evaluation Note  Kim [name] , a 61 y.o. female  was evaluated in triage.  Patient was sent over here by her PCP with concern with shortness of breath over the past 3 weeks, increased fatigue, and generalized weakness.  Per PCP note, patient was hypoxic down to 81% on room air and required 3 L of oxygen.  Patient does state that she uses 3 L of oxygen at night when she sleeps, but does not use this during the day.  Patient is really unsure why she is here and does not give much information about how she is been feeling over the past couple weeks unless I ask her directly yes or no questions.  Does deny chest pain, abdominal pain, nausea, vomiting, numbness, weakness, fever, chills, cough. Review of Systems  Positive:  Negative:   Physical Exam  BP (!) 140/65 (BP Location: Right Arm)   Pulse 79   Temp 98.6 F (37 C) (Oral)   Resp 18   SpO2 96%  Gen:   Awake, no distress   Resp:  Normal effort  MSK:   Moves extremities without difficulty  Other:  Difficult to assess lung sounds due to large body habitus.  She does seem diminished bilaterally without any notable wheezing  Medical Decision Making  Medically screening exam initiated at 4:41 PM.  Appropriate orders placed.  Kameshia Madruga Ranganathan was informed that the remainder of the evaluation will be completed by another provider, this initial triage assessment does not replace that evaluation, and the importance of remaining in the ED until their evaluation is complete.  I have ordered duo nebulizer   Claudie Leach, PA-C 10/06/22 1643

## 2022-10-06 NOTE — Progress Notes (Deleted)
Last 2-3 weeks Sleepy all the time, short of breath, trouble doing nearly anything due to shob Uses albuterol, pulmicort Is using albuterol inhaler 4-5 times per day and that's increased over this time too Kind of helps, she says Hasn't had albuterol for about a week Uses 3L at night, hasn't increased, but recently has felt like she needs more Still smoking cigarettes, .5 to 1 ppd; had quit for 2 years. Wants more nicotine patches No recent illnesses  O2 81% on RA, up to 90% on 3L  -direct admit?

## 2022-10-06 NOTE — Progress Notes (Signed)
   CC: dyspnea  HPI:  [name] is a 61 y.o. person with past medical history as detailed below who presents today for complaint of dyspnea and requesting nicotine patches. Please see problem based charting for detailed assessment and plan.  Past Medical History:  Diagnosis Date   COPD (chronic obstructive pulmonary disease) (HCC)    High cholesterol    Hypertension    Smoker    Review of Systems:  Negative unless otherwise stated.  Physical Exam:  Vitals:   10/06/22 1417 10/06/22 1443  BP: 126/70   Pulse: 78   Temp: 97.8 F (36.6 C)   TempSrc: Oral   SpO2: (!) 81% 90%  Weight: 258 lb 4.8 oz (117.2 kg)    Constitutional:Appears fatigued, needs wheelchair for transportation.  Cardio:Regular rate and rhythm. No murmurs, rubs, or gallops. Pulm:Diffuse expiratory wheezing with bibasilar crackles. Unable to complete full sentences without pausing with delayed responses. On 3L [place] with O2 sat 89-90%. Neuro:Delayed response to questions towards end of examination. Psych:Pleasant mood and affect.  Assessment & Plan:   See Encounters Tab for problem based charting. Acute hypoxemic respiratory failure (HCC) Patient reports that over the last 2-3 weeks she has had increased fatigue and dyspnea. She has had dyspnea that limits her completion of most ADLs to the point that she is exhausted trying to get out of bed to try to shower. She uses 3L supplemental O2 at night at baseline but states that over this time she does not feel that it is enough. She uses a pulmicort nebulizer each night which she states helps some and an albuterol inhaler, though she is out of the albuterol inhaler. She was using this up to 4-5 times daily which is increased from baseline.   She is still smoking anywhere from 0.50-1 PPD and requests additional nicotine patches. She denies recent illnesses or sick contacts.  On exam she has O2 saturation of 81% on RA which increases to 89-90% on 3L . She  struggles to complete full sentences throughout our conversation and pauses for moments intermittently before continuing to speak. She has diffuse expiratory wheezing and bibasilar crackles that are mild. She appears fatigued.  Assesment:I am concerned that she is experiencing a COPD exacerbation and given her dangerous O2 saturations, feel that she needs admission to the hospital for stabilization. I have discussed this with her and expressed my concern for her current presentation and emphasized that I do not feel it would be safe for her to go home in her current state. She is in agreement to transport to the ED to initiate further treatment. Plan:Patient transported to ED. She would benefit from a short course of steroids, breathing treatments. She also will need chest x-ray and labs including VBG and BMP.  ADDENDUM: Patient left ED before being seen. I will discontinue pulmicort and send in the following medications and have her scheduled for urgent follow up first thing next week: -Refill of albuterol -Spiriva respimat (tiotropium bromide) 2 puffs daily -Prednisone 40 mg daily for 5 days -Azithromycin 500 mg daily for 3 days -Nicotine patches: 14 mg/patch, one patch daily for 6 weeks followed by 7 mg/patch, one patch daily for 2 weeks  At the time this note is being signed (12/08 0850) I have not been able to reach the patient to inform her of these medications or urgent follow up but will continue to reach out.   Patient discussed with Dr. Antony Contras

## 2022-10-07 ENCOUNTER — Telehealth: Payer: Self-pay | Admitting: Student

## 2022-10-07 ENCOUNTER — Ambulatory Visit (INDEPENDENT_AMBULATORY_CARE_PROVIDER_SITE_OTHER): Payer: Medicaid Other | Admitting: Internal Medicine

## 2022-10-07 DIAGNOSIS — J441 Chronic obstructive pulmonary disease with (acute) exacerbation: Secondary | ICD-10-CM

## 2022-10-07 MED ORDER — AZITHROMYCIN 500 MG PO TABS: 500.0000 mg | ORAL_TABLET | Freq: Every day | ORAL | 0 refills | Status: DC

## 2022-10-07 MED ORDER — NICOTINE 7 MG/24HR TD PT24: 7.0000 mg | MEDICATED_PATCH | TRANSDERMAL | 0 refills | Status: AC

## 2022-10-07 MED ORDER — ALBUTEROL SULFATE HFA 108 (90 BASE) MCG/ACT IN AERS: 2.0000 | INHALATION_SPRAY | RESPIRATORY_TRACT | 2 refills | Status: AC | PRN

## 2022-10-07 MED ORDER — NICOTINE 14 MG/24HR TD PT24: 14.0000 mg | MEDICATED_PATCH | TRANSDERMAL | 0 refills | Status: AC

## 2022-10-07 MED ORDER — PREDNISONE 20 MG PO TABS: 40.0000 mg | ORAL_TABLET | Freq: Every day | ORAL | 0 refills | Status: DC

## 2022-10-07 MED ORDER — SPIRIVA RESPIMAT 1.25 MCG/ACT IN AERS: 2.0000 | INHALATION_SPRAY | Freq: Every day | RESPIRATORY_TRACT | 3 refills | Status: DC

## 2022-10-07 NOTE — Assessment & Plan Note (Addendum)
Patient reports that over the last 2-3 weeks she has had increased fatigue and dyspnea. She has had dyspnea that limits her completion of most ADLs to the point that she is exhausted trying to get out of bed to try to shower. She uses 3L supplemental O2 at night at baseline but states that over this time she does not feel that it is enough. She uses a pulmicort nebulizer each night which she states helps some and an albuterol inhaler, though she is out of the albuterol inhaler. She was using this up to 4-5 times daily which is increased from baseline.   She is still smoking anywhere from 0.50-1 PPD and requests additional nicotine patches. She denies recent illnesses or sick contacts.  On exam she has O2 saturation of 81% on RA which increases to 89-90% on 3L [place]. She struggles to complete full sentences throughout our conversation and pauses for moments intermittently before continuing to speak. She has diffuse expiratory wheezing and bibasilar crackles that are mild. She appears fatigued.  Assesment:I am concerned that she is experiencing a COPD exacerbation and given her dangerous O2 saturations, feel that she needs admission to the hospital for stabilization. I have discussed this with her and expressed my concern for her current presentation and emphasized that I do not feel it would be safe for her to go home in her current state. She is in agreement to transport to the ED to initiate further treatment. Plan:Patient transported to ED. She would benefit from a short course of steroids, breathing treatments. She also will need chest x-ray and labs including VBG and BMP.  ADDENDUM: Patient left ED before being seen. I will discontinue pulmicort and send in the following medications and have her scheduled for urgent follow up first thing next week: -Refill of albuterol -Spiriva respimat (tiotropium bromide) 2 puffs daily -Prednisone 40 mg daily for 5 days -Azithromycin 500 mg daily for 3 days -Nicotine  patches: 14 mg/patch, one patch daily for 6 weeks followed by 7 mg/patch, one patch daily for 2 weeks  At the time this note is being signed (12/08 0850) I have not been able to reach the patient to inform her of these medications or urgent follow up but will continue to reach out.

## 2022-10-07 NOTE — Progress Notes (Signed)
Return phone call made regarding plan following yesterday's office visit.  Please see note from 10/06/2022 visit for full details.  No charge.

## 2022-10-07 NOTE — Telephone Encounter (Signed)
Patient is requesting a call back from Dr. August Saucer.  Made her aware that Dr. August Saucer did send the front office a message this morning wanting her to be seen next week.  Appointment has been scheduled for next week 10/11/2022 at 2:15 pm.  Patient states she will try to make it to the appointment.  Forwarding this message to Dr. August Saucer and triage nurse.

## 2022-10-10 NOTE — Progress Notes (Signed)
Internal Medicine Clinic Attending ? ?Case discussed with Dr. Dean  At the time of the visit.  We reviewed the resident?s history and exam and pertinent patient test results.  I agree with the assessment, diagnosis, and plan of care documented in the resident?s note.  ?

## 2022-10-11 ENCOUNTER — Ambulatory Visit (INDEPENDENT_AMBULATORY_CARE_PROVIDER_SITE_OTHER): Payer: Medicaid Other | Admitting: Student

## 2022-10-11 ENCOUNTER — Encounter: Payer: Self-pay | Admitting: Student

## 2022-10-11 VITALS — BP 130/48 | HR 87 | Temp 98.0°F | Ht 66.0 in | Wt 258.8 lb

## 2022-10-11 DIAGNOSIS — G4733 Obstructive sleep apnea (adult) (pediatric): Secondary | ICD-10-CM | POA: Diagnosis not present

## 2022-10-11 DIAGNOSIS — Z139 Encounter for screening, unspecified: Secondary | ICD-10-CM | POA: Diagnosis not present

## 2022-10-11 DIAGNOSIS — I10 Essential (primary) hypertension: Secondary | ICD-10-CM

## 2022-10-11 DIAGNOSIS — J441 Chronic obstructive pulmonary disease with (acute) exacerbation: Secondary | ICD-10-CM

## 2022-10-11 DIAGNOSIS — F17210 Nicotine dependence, cigarettes, uncomplicated: Secondary | ICD-10-CM

## 2022-10-11 MED ORDER — TRELEGY ELLIPTA 100-62.5-25 MCG/ACT IN AEPB: 1.0000 [IU] | INHALATION_SPRAY | Freq: Every day | RESPIRATORY_TRACT | 11 refills | Status: DC

## 2022-10-11 NOTE — Progress Notes (Signed)
Subjective:  CC: COPD follow up  HPI:  Ms.Jorden L Lias is a 61 y.o. female with a past medical history stated below and presents today for COPD follow up. Please see problem based assessment and plan for additional details.  Past Medical History:  Diagnosis Date   COPD (chronic obstructive pulmonary disease) (HCC)    High cholesterol    Hypertension    Smoker     Current Outpatient Medications on File Prior to Visit  Medication Sig Dispense Refill   acetaminophen (TYLENOL) 500 MG tablet Take 500 mg by mouth 3 (three) times daily as needed (pain).     albuterol (VENTOLIN HFA) 108 (90 Base) MCG/ACT inhaler Inhale 2 puffs into the lungs every 4 (four) hours as needed for wheezing or shortness of breath. 18 g 2   amLODipine (NORVASC) 5 MG tablet Take 1 tablet (5 mg total) by mouth daily. 30 tablet 1   azithromycin (ZITHROMAX) 500 MG tablet Take 1 tablet (500 mg total) by mouth daily. 3 tablet 0   Calcium Carbonate Antacid (TUMS PO) Take 2 tablets by mouth 2 (two) times daily as needed (heartburn).     nicotine (NICODERM CQ - DOSED IN MG/24 HOURS) 14 mg/24hr patch Place 1 patch (14 mg total) onto the skin daily. 42 patch 0   [START ON 11/19/2022] nicotine (NICODERM CQ - DOSED IN MG/24 HR) 7 mg/24hr patch Place 1 patch (7 mg total) onto the skin daily for 14 days. 14 patch 0   polyethylene glycol powder (GLYCOLAX/MIRALAX) 17 GM/SCOOP powder Take 17 g by mouth daily. 238 g 0   pravastatin (PRAVACHOL) 20 MG tablet TAKE 1 TABLET BY MOUTH EVERYDAY AT BEDTIME 90 tablet 0   predniSONE (DELTASONE) 20 MG tablet Take 2 tablets (40 mg total) by mouth daily with breakfast. 10 tablet 0   senna-docusate (SENOKOT-S) 8.6-50 MG tablet Take 1 tablet by mouth at bedtime as needed for mild constipation. 2 tablet 0   triamcinolone (KENALOG) 0.025 % ointment Apply 1 Application topically 2 (two) times daily as needed (Itching). 30 g 0   No current facility-administered medications on file prior to visit.     Family History  Problem Relation Age of Onset   CAD Mother     Social History   Socioeconomic History   Marital status: Single    Spouse name: Not on file   Number of children: Not on file   Years of education: Not on file   Highest education level: Not on file  Occupational History   Not on file  Tobacco Use   Smoking status: Every Day    Packs/day: 1.00    Types: Cigarettes   Smokeless tobacco: Never  Substance and Sexual Activity   Alcohol use: Not Currently   Drug use: Not Currently   Sexual activity: Yes  Other Topics Concern   Not on file  Social History Narrative   Not on file   Social Determinants of Health   Financial Resource Strain: Not on file  Food Insecurity: Not on file  Transportation Needs: Not on file  Physical Activity: Not on file  Stress: Not on file  Social Connections: Not on file  Intimate Partner Violence: Not on file    Review of Systems: ROS negative except for what is noted on the assessment and plan.  Objective:   Vitals:   10/11/22 1434 10/11/22 1514  BP: (!) 130/48   Pulse: 87   Temp: 98 F (36.7 C)   TempSrc:  Oral   SpO2: (!) 80% 91%  Weight: 258 lb 12.8 oz (117.4 kg)   Height: 5\' 6"  (1.676 m)     Physical Exam: Constitutional: Ill-appearing woman sitting in wheelchair, in no acute distress HENT: normocephalic atraumatic, mucous membranes moist Eyes: conjunctiva non-erythematous Neck: supple Cardiovascular: regular rate and rhythm, no m/r/g Pulmonary/Chest:speaking in full sentences with 3L O2, lungs clear to auscultation bilaterally Abdominal: soft, non-tender, non-distended MSK: normal bulk and tone Neurological: alert & oriented x 3,moving all extremities Skin: warm and dry Psych: pleasant mood and affect     Assessment & Plan:   COPD exacerbation (HCC) Current smoker, on 3L O2 at baseline.Recently seem in OV for dyspnea and fatigue, limiting ADLs. 12/8 exam 89% on 3L with difficulty speaking  andtransported to ED for admission for COPDE but patient left prior to triage. She was prescribed prednisone and azithromycin  therapy and Spiriva. She reports that she only took 2 days of both azithromycin and prednisone. She continues to have a difficult time using all of her inhalers. She has remained consistent with her nebulized treatments. She still fels fatigued and experiencing day-time somnolence. Denies cough or increased sputum production. Denies chest pain, changes in mentation, or changes in vision. Uses 3L O2 at home continuously now. Patient also has OSA but does not have a PAP at home. No O2 oximeter at home.  Has had a difficult time making appointments as she mistrusts unknown callers.   Today patients initial O2 sat was 80% on RA as she walked into office. Patient was placed on 3L O2, and saturation increased to 91-92%. She was speaking in full sentences. Her lungs were clear to auscultation. Patient's extremities were warm and well perfused.  Patient has had several exacerbations this year. There have been several attempts to get her COPD treatment optimized, including pulmonology referrals, PFTs, nicotine patches, and additional inhalers. It is reassuring patient does not have symptoms of COPD exacerbation today. Worked with our referral coordinator to get her pulmonology appointment scheduled. Patient will also need DME for portable oxygen and pulse oximeter.  In terms of therapy, she has been noted to have emphysema in the past, is a current smoker, and has been treated for COPD exacerbations in the past. Likely classified as COPD GOLD E, and would benefit with LAMA/LABA/ICS. Plan -continue dueonebs and rescue inhaler -Stop Spiriva -Start Trelegy once daily, sample given from clinic today -DME referral for portable oxygen and oximeter -Referral and appointment made at [facility]  OSA (obstructive sleep apnea) Not currently using CPAP as she does not have machine and  needs new sleep study. Patient referral for sleep study could not be done in the past as the study center prefers patient be seen at pulmonology first. -Referral to pulmonology and appointment scheduled today  Essential hypertension Patient tolerating amlodipine 10 mg without side effects -Continue amlodipine 10 mg  Encounter for screening involving social determinants of health [facility]) Patient reports having difficulty attending all of her medical visits as she does not always have transportation. She is also having difficulty performing ADLs and iADLs at home. Her daughter works during the day and her husband has been diagnosed with a terminal cancer. Patient would benefit from care coordination in the setting of multiple stressors and high risk, uncontrolled medical conditions. -referral for care coordination placed   Patient seen with [facility]. [facility]

## 2022-10-11 NOTE — Assessment & Plan Note (Signed)
Current smoker, on 3L O2 at baseline.Recently seem in OV for dyspnea and fatigue, limiting ADLs. 12/8 exam 89% on 3L with difficulty speaking andtransported to ED for admission for COPDE but patient left prior to triage. She was prescribed prednisone and azithromycin  therapy and Spiriva. She reports that she only took 2 days of both azithromycin and prednisone. She continues to have a difficult time using all of her inhalers. She has remained consistent with her nebulized treatments. She still fels fatigued and experiencing day-time somnolence. Denies cough or increased sputum production. Denies chest pain, changes in mentation, or changes in vision. Uses 3L O2 at home continuously now. Patient also has OSA but does not have a PAP at home. No O2 oximeter at home.  Has had a difficult time making appointments as she mistrusts unknown callers.   Today patients initial O2 sat was 80% on RA as she walked into office. Patient was placed on 3L O2, and saturation increased to 91-92%. She was speaking in full sentences. Her lungs were clear to auscultation. Patient's extremities were warm and well perfused.  Patient has had several exacerbations this year. There have been several attempts to get her COPD treatment optimized, including pulmonology referrals, PFTs, nicotine patches, and additional inhalers. It is reassuring patient does not have symptoms of COPD exacerbation today. Worked with our referral coordinator to get her pulmonology appointment scheduled. Patient will also need DME for portable oxygen and pulse oximeter.  In terms of therapy, she has been noted to have emphysema in the past, is a current smoker, and has been treated for COPD exacerbations in the past. Likely classified as COPD GOLD E, and would benefit with LAMA/LABA/ICS. Plan -continue dueonebs and rescue inhaler -Stop Spiriva -Start Trelegy once daily, sample given from clinic today -DME referral for portable oxygen and  oximeter -Referral and appointment made at [facility]

## 2022-10-11 NOTE — Patient Instructions (Signed)
Thank you, Ms.Wynelle Fanny Gannett for allowing [name] to provide your care today. Today we discussed your breathing problems  You need your oxygen when you are in and out of the house Stop using the spiriva inhaler Start using the Trelegy inhaler, one puff once a day Please pick up your phone as the lung doctos and the care coordinators need to reach you to help you with equipment, transportation, and with office visits.    My Chart Access: [email or web address]  Please follow-up in in 4 weeks.  Please make sure to arrive 15 minutes prior to your next appointment. If you arrive late, you may be asked to reschedule.    We look forward to seeing you next time. Please call our clinic at [phone] if you have any questions or concerns. The best time to call is Monday-Friday from 9am-4pm, but there is someone available 24/7. If after hours or the weekend, call the main hospital number and ask for the Internal Medicine Resident On-Call. If you need medication refills, please notify your pharmacy one week in advance and they will send [name] a request.   Thank you for letting us take part in your care. Wishing you the best!  Morene Crocker, MD 10/11/2022, 3:20 PM [facility] Internal Medicine Resident, PGY-1

## 2022-10-11 NOTE — Assessment & Plan Note (Signed)
Not currently using CPAP as she does not have machine and needs new sleep study. Patient referral for sleep study could not be done in the past as the study center prefers patient be seen at pulmonology first. -Referral to pulmonology and appointment scheduled today

## 2022-10-11 NOTE — Assessment & Plan Note (Signed)
Patient reports having difficulty attending all of her medical visits as she does not always have transportation. She is also having difficulty performing ADLs and iADLs at home. Her daughter works during the day and her husband has been diagnosed with a terminal cancer. Patient would benefit from care coordination in the setting of multiple stressors and high risk, uncontrolled medical conditions. -referral for care coordination placed

## 2022-10-11 NOTE — Assessment & Plan Note (Signed)
Patient tolerating amlodipine 10 mg without side effects -Continue amlodipine 10 mg

## 2022-10-12 ENCOUNTER — Telehealth: Payer: Self-pay

## 2022-10-12 NOTE — Telephone Encounter (Signed)
Decision:Denied onnie Zenon (Key: [id number]) Rx #: [id number] Trelegy Ellipta 100-62.5-25MCG/ACT aerosol powder   Form [employer] of [employer] Electronic Prior Authorization Request Form [phone] NCPDP) Created Message from Plan Your request has been denied

## 2022-10-12 NOTE — Progress Notes (Signed)
Internal Medicine Clinic Attending  I saw and evaluated the patient.  I personally confirmed the key portions of the history and exam documented by Dr. Lily Kocher and I reviewed pertinent patient test results.  The assessment, diagnosis, and plan were formulated together and I agree with the documentation in the resident's note.   Patient has COPD and needs lot of assistance with care coordination. Today, we started Trelegy, and gave her a sample from clinic. She should continue on LAMA/LABA/ICS. Our office made a pulm appointment for her. She currently doesn't have portable oxygen, and she absolutely needs it - desatted to 80% after walking in our clinic. Orders sent in today.

## 2022-10-12 NOTE — Telephone Encounter (Signed)
Prior Authorization for patient (Trelegy Ellipta) came through  on cover my meds was submitted with last office notes awaiting approval or denial

## 2022-10-13 ENCOUNTER — Telehealth: Payer: Self-pay | Admitting: *Deleted

## 2022-10-13 NOTE — Telephone Encounter (Signed)
Patient called in stating she received a call from "the oxygen people" stating the order for POC was placed incorrectly and she needs a walking test. Per OV note on 12/12, patient arrived w/o oxygen and O2 Sat was at 80%. She recovered to 91-92% on 3L oxygen. That should be sufficient documentation of oxygen saturations.

## 2022-10-14 NOTE — Telephone Encounter (Signed)
Spoke with Shanda Bumps With [employer] @ the [facility]. The order that was faxed is correct and nothing else is needed.  The Order faxed in were sent to the [place] and they did not send all of the information that was faxed from our Office.  Per Shanda Bumps her apologizes and if anything else is needed they will reach back out to our office.

## 2022-10-20 ENCOUNTER — Other Ambulatory Visit: Payer: Medicaid Other | Admitting: *Deleted

## 2022-10-20 NOTE — Patient Instructions (Signed)
Visit Information  Ms. Kim [name]  - as a part of your Medicaid benefit, you are eligible for care management and care coordination services at no cost or copay. I was unable to reach you by phone today but would be happy to help you with your health related needs. Please feel free to call me @ [phone].   A member of the Managed Medicaid care management team will reach out to you again over the next 14 days.   Jceon Alverio RN, BSN [place]  [facility] RN Care Coordinator   

## 2022-10-20 NOTE — Patient Outreach (Signed)
  Medicaid Managed Care   Unsuccessful Attempt Note   10/20/2022 Name: [name] MRN: [id number] DOB: [birth year]  Referred by: Modena Slater, DO Reason for referral : High Risk Managed Medicaid (Unsuccessful RNCM initial outreach)   An unsuccessful telephone outreach was attempted today. The patient was referred to the case management team for assistance with care management and care coordination.    Follow Up Plan: A HIPAA compliant phone message was left for the patient providing contact information and requesting a return call. and The Managed Medicaid care management team will reach out to the patient again over the next 14 days.    Estanislado Emms RN, BSN [place]  [facility] [employer]

## 2022-10-31 DIAGNOSIS — Z419 Encounter for procedure for purposes other than remedying health state, unspecified: Secondary | ICD-10-CM | POA: Diagnosis not present

## 2022-11-11 ENCOUNTER — Other Ambulatory Visit: Payer: Medicaid Other | Admitting: *Deleted

## 2022-11-11 NOTE — Patient Instructions (Signed)
Visit Information  Ms. Glenis Smoker  - as a part of your Medicaid benefit, you are eligible for care management and care coordination services at no cost or copay. I was unable to reach you by phone today but would be happy to help you with your health related needs. Please feel free to call me @ [phone].   A member of the Managed Medicaid care management team will reach out to you again over the next 14 days.   Lurena Joiner RN, BSN [place]  [employer] [employer] manager

## 2022-11-11 NOTE — Patient Outreach (Signed)
  Medicaid Managed Care   Unsuccessful Attempt Note   11/11/2022 Name: Kim [name] MRN: [id number] DOB: Feb 29, 1961  Referred by: Leigh Aurora, DO Reason for referral : High Risk Managed Medicaid (Unsuccessful RNCM initial outreach)   A second unsuccessful telephone outreach was attempted today. The patient was referred to the case management team for assistance with care management and care coordination.    Follow Up Plan: A HIPAA compliant phone message was left for the patient providing contact information and requesting a return call. and The Managed Medicaid care management team will reach out to the patient again over the next 14 days.    Lurena Joiner RN, BSN [place]  [employer] manager

## 2022-11-15 ENCOUNTER — Telehealth: Payer: Self-pay

## 2022-11-15 NOTE — Telephone Encounter (Signed)
..  Medicaid Managed Care Note  11/15/2022 Name: [name] MRN: [id number] DOB: [birth year]  Kim [name] is a 62 y.o. year old female who is a primary care patient of Leigh Aurora, DO and is actively engaged with the care management team. I reached out to Glenis Smoker by phone today to assist with re-scheduling an initial visit with the RN Case Manager  Follow up plan: Unsuccessful telephone outreach attempt made. A HIPAA compliant phone message was left for the patient providing contact information and requesting a return call.  We have been unable to make contact with the patient for follow up. The care management team is available to follow up with the patient after provider conversation with the patient regarding recommendation for care management engagement and subsequent re-referral to the care management team.   [place], [place]

## 2022-11-18 DIAGNOSIS — R531 Weakness: Secondary | ICD-10-CM | POA: Diagnosis not present

## 2022-11-25 ENCOUNTER — Other Ambulatory Visit: Payer: Medicaid Other | Admitting: *Deleted

## 2022-11-25 NOTE — Patient Outreach (Cosign Needed)
  Medicaid Managed Care   Unsuccessful Attempt Note   11/25/2022 Name: Kim [name] MRN: [id number] DOB: 08-26-1961  Referred by: Leigh Aurora, DO Reason for referral : High Risk Managed Medicaid (Unsuccessful RNCM initial outreach)   Third unsuccessful telephone outreach was attempted today. The patient was referred to the case management team for assistance with care management and care coordination. The patient's primary care provider has been notified of our unsuccessful attempts to make or maintain contact with the patient. The care management team is pleased to engage with this patient at any time in the future should he/she be interested in assistance from the care management team.    Follow Up Plan: The Managed Medicaid care management team is available to follow up with the patient after provider conversation with the patient regarding recommendation for care management engagement and subsequent re-referral to the care management team.     Lurena Joiner RN, BSN [place] RN Care Coordinator

## 2022-12-01 DIAGNOSIS — Z419 Encounter for procedure for purposes other than remedying health state, unspecified: Secondary | ICD-10-CM | POA: Diagnosis not present

## 2022-12-02 ENCOUNTER — Telehealth: Payer: Self-pay

## 2022-12-02 ENCOUNTER — Other Ambulatory Visit: Payer: Self-pay | Admitting: Student

## 2022-12-02 DIAGNOSIS — J441 Chronic obstructive pulmonary disease with (acute) exacerbation: Secondary | ICD-10-CM

## 2022-12-02 MED ORDER — UMECLIDINIUM BROMIDE 62.5 MCG/ACT IN AEPB: 1.0000 | INHALATION_SPRAY | Freq: Every day | RESPIRATORY_TRACT | 11 refills | Status: DC

## 2022-12-02 MED ORDER — FLUTICASONE FUROATE-VILANTEROL 100-25 MCG/ACT IN AEPB: 1.0000 | INHALATION_SPRAY | Freq: Every day | RESPIRATORY_TRACT | 11 refills | Status: DC

## 2022-12-02 NOTE — Telephone Encounter (Signed)
Prior Authorization for patient (Fluticasone-Furoate-Vilanterol) came through on cover my meds was submitted with last office notes awaiting approval or denial

## 2022-12-02 NOTE — Progress Notes (Signed)
Patient with 6 doses left of Trelegy sample at home. Switched Trellegy for [facility] and vilanterol inhalers to use instead. Patient called and notified; expressed understanding.  Romana Juniper, MD [facility] Internal Medicine Program - PGY-1 12/02/22

## 2022-12-05 ENCOUNTER — Other Ambulatory Visit: Payer: Self-pay | Admitting: Student

## 2022-12-05 DIAGNOSIS — J441 Chronic obstructive pulmonary disease with (acute) exacerbation: Secondary | ICD-10-CM

## 2022-12-05 MED ORDER — DULERA 100-5 MCG/ACT IN AERO: 2.0000 | INHALATION_SPRAY | Freq: Every day | RESPIRATORY_TRACT | 11 refills | Status: DC

## 2022-12-05 NOTE — Telephone Encounter (Signed)
Decision:Denied Kiran Lapine (Key: [id number]) PA Case ID #: [id number] Rx #: [id number] Need Help? Call us at [phone] Outcome Denied on February 2 Denied. Per the health plan's preferred drug list, at least 2 preferred drugs must be tried before requesting this drug or tell us why the member cannot try any preferred alternatives. Please send [name] supporting chart notes and lab results. Here is list of preferred alternatives: Advair Diskus, Advair HFA Inhaler, Dulera Inhaler, Symbicort Inhaler. Note: Some preferred drug(s) may have quantity limits. Refer to the health plan's preferred drug list for additional details. Drug Fluticasone Furoate-Vilanterol 100-25MCG/ACT aerosol powder ePA cloud logo Form [facility] Medicaid of [place] Prior Authorization Request Form [phone] NCPDP) Original Claim Info 75 Must try and fail preferred productMust try and fail preferred productSubmit 3 DS For Emerg Fill with PA Type01, PA Number 2902, Level of Service 3

## 2022-12-05 NOTE — Progress Notes (Signed)
PA was denied for [facility]. Switched prescription to [facility] inhaler, covered under Medicaid formulary.

## 2022-12-13 ENCOUNTER — Other Ambulatory Visit: Payer: Self-pay | Admitting: Student

## 2022-12-13 ENCOUNTER — Institutional Professional Consult (permissible substitution): Payer: Medicaid Other | Admitting: Internal Medicine

## 2022-12-19 DIAGNOSIS — R531 Weakness: Secondary | ICD-10-CM | POA: Diagnosis not present

## 2022-12-20 ENCOUNTER — Encounter: Payer: Medicaid Other | Admitting: Student

## 2022-12-30 DIAGNOSIS — Z419 Encounter for procedure for purposes other than remedying health state, unspecified: Secondary | ICD-10-CM | POA: Diagnosis not present

## 2023-01-17 DIAGNOSIS — R531 Weakness: Secondary | ICD-10-CM | POA: Diagnosis not present

## 2023-01-30 DIAGNOSIS — Z419 Encounter for procedure for purposes other than remedying health state, unspecified: Secondary | ICD-10-CM | POA: Diagnosis not present

## 2023-02-15 ENCOUNTER — Telehealth: Payer: Self-pay

## 2023-02-15 NOTE — Telephone Encounter (Signed)
Lm for patient to ask if she has had previous sleep study.  

## 2023-02-16 ENCOUNTER — Institutional Professional Consult (permissible substitution): Payer: Medicaid Other | Admitting: Internal Medicine

## 2023-02-16 NOTE — Telephone Encounter (Signed)
Patient is returning a call.  Please call patient back at [phone]

## 2023-02-16 NOTE — Telephone Encounter (Signed)
Lm x2 for patient.  Will close encounter per office protocol.   

## 2023-02-17 DIAGNOSIS — R531 Weakness: Secondary | ICD-10-CM | POA: Diagnosis not present

## 2023-03-01 ENCOUNTER — Encounter: Payer: Medicaid Other | Admitting: Student

## 2023-03-01 DIAGNOSIS — Z419 Encounter for procedure for purposes other than remedying health state, unspecified: Secondary | ICD-10-CM | POA: Diagnosis not present

## 2023-03-07 ENCOUNTER — Ambulatory Visit (INDEPENDENT_AMBULATORY_CARE_PROVIDER_SITE_OTHER): Payer: Medicaid Other | Admitting: Student

## 2023-03-07 ENCOUNTER — Encounter: Payer: Self-pay | Admitting: Student

## 2023-03-07 DIAGNOSIS — J441 Chronic obstructive pulmonary disease with (acute) exacerbation: Secondary | ICD-10-CM | POA: Diagnosis not present

## 2023-03-07 DIAGNOSIS — F17210 Nicotine dependence, cigarettes, uncomplicated: Secondary | ICD-10-CM

## 2023-03-07 MED ORDER — BENZONATATE 100 MG PO CAPS: 100.0000 mg | ORAL_CAPSULE | Freq: Three times a day (TID) | ORAL | 0 refills | Status: DC | PRN

## 2023-03-07 MED ORDER — DULERA 100-5 MCG/ACT IN AERO: 2.0000 | INHALATION_SPRAY | Freq: Every day | RESPIRATORY_TRACT | 11 refills | Status: DC

## 2023-03-07 MED ORDER — PREDNISONE 20 MG PO TABS: 40.0000 mg | ORAL_TABLET | Freq: Every day | ORAL | 0 refills | Status: AC

## 2023-03-07 MED ORDER — UMECLIDINIUM BROMIDE 62.5 MCG/ACT IN AEPB: 1.0000 | INHALATION_SPRAY | Freq: Every day | RESPIRATORY_TRACT | 11 refills | Status: AC

## 2023-03-07 MED ORDER — AZITHROMYCIN 500 MG PO TABS: 500.0000 mg | ORAL_TABLET | Freq: Every day | ORAL | 0 refills | Status: DC

## 2023-03-07 NOTE — Assessment & Plan Note (Addendum)
Patient presents to the clinic for 3 days of worsening coughing, sputum production and shortness of breath.  She only has wheezing with coughing.  She states that her sputum is greenish.  She is unsure about fever or chills.  She denies any flulike symptoms.  She states that her daughter is sick at home with respiratory symptoms.    She is using 3 L oxygen at home and has increased to 5 L recently due to increased shortness of breath.  Patient has an albuterol inhaler but not sure about her maintenance therapy.  Her husband thinks the patient is not using any maintenance inhalers at home.  Our clinic tried to put her on Trelegy at last visit but [facility] and Incruse Ellipta were in the chart, may be due to cost issue.  She picked up a 30-day supplies back in February.  This COPD exacerbation could be due to a viral infection in the setting of medication nonadherence.  It is reassuring that her O2 sat is 94% on 3 L without any signs of respiratory distress.  She is able to speak in full sentences.  No indication for hospitalization.  Will manage her COPD exacerbation with prednisone, azithromycin and Tessalon Perles.  I also refill her Dulera and Incruse and encourage her to use them every day.  Patient will follow-up in 1 week for progress.  She will follow-up with pulmonology in 6/11.

## 2023-03-07 NOTE — Patient Instructions (Signed)
Kim [name],  I am sorry you are not feeling well.  For your COPD, I sent in a short course of steroid, antibiotic and cough medication.  I also refilled your inhalers which you should use every day.  Please follow-up in 1 week to make sure you are doing okay  Dr. Cyndie Chime

## 2023-03-07 NOTE — Progress Notes (Signed)
   CC: cough  HPI:  [name] is a 62 y.o. living with COPD on chronic 3 L, hypertension, who presents to the clinic for 3 days of worsening coughing and sputum production.  Please see problem based charting for detail  Past Medical History:  Diagnosis Date   COPD (chronic obstructive pulmonary disease) (HCC)    High cholesterol    Hypertension    Smoker    Review of Systems:  per HPI  Physical Exam:  Vitals:   03/07/23 1528  BP: 125/67  Pulse: 90  Temp: 98.3 F (36.8 C)  TempSrc: Oral  SpO2: 94%  Weight: 240 lb 8 oz (109.1 kg)  Height: 5\' 6"  (1.676 m)   Physical Exam Constitutional:      General: She is not in acute distress. HENT:     Head: Normocephalic.  Eyes:     General:        Right eye: No discharge.        Left eye: No discharge.     Conjunctiva/sclera: Conjunctivae normal.  Cardiovascular:     Rate and Rhythm: Normal rate and regular rhythm.     Heart sounds: Normal heart sounds.  Pulmonary:     Effort: Pulmonary effort is normal. No respiratory distress.     Comments: No increased work of breathing.  Patient is able to speak in full sentences.  Mild expiratory wheezing.  Poor air movements throughout. Musculoskeletal:     Cervical back: Normal range of motion.  Skin:    General: Skin is warm.  Neurological:     Mental Status: She is alert. Mental status is at baseline.  Psychiatric:        Mood and Affect: Mood normal.      Assessment & Plan:   See Encounters Tab for problem based charting.  COPD exacerbation (HCC) Patient presents to the clinic for 3 days of worsening coughing, sputum production and shortness of breath.  She only has wheezing with coughing.  She states that her sputum is greenish.  She is unsure about fever or chills.  She denies any flulike symptoms.  She states that her daughter is sick at home with respiratory symptoms.    She is using 3 L oxygen at home and has increased to 5 L recently due to increased  shortness of breath.  Patient has an albuterol inhaler but not sure about her maintenance therapy.  Her husband thinks the patient is not using any maintenance inhalers at home.  Our clinic tried to put her on Trelegy at last visit but [facility] and Incruse Ellipta were in the chart, may be due to cost issue.  She picked up a 30-day supplies back in February.  This COPD exacerbation could be due to a viral infection in the setting of medication nonadherence.  It is reassuring that her O2 sat is 94% on 3 L without any signs of respiratory distress.  She is able to speak in full sentences.  No indication for hospitalization.  Will manage her COPD exacerbation with prednisone, azithromycin and Tessalon Perles.  I also refill her Dulera and Incruse and encourage her to use them every day.  Patient will follow-up in 1 week for progress.  She will follow-up with pulmonology in 6/11.   Patient discussed with Dr.  Sol Blazing

## 2023-03-08 NOTE — Progress Notes (Signed)
Internal Medicine Clinic Attending  Case discussed with Dr. Nguyen  At the time of the visit.  We reviewed the resident's history and exam and pertinent patient test results.  I agree with the assessment, diagnosis, and plan of care documented in the resident's note. 

## 2023-03-14 ENCOUNTER — Encounter: Payer: Medicaid Other | Admitting: Student

## 2023-03-14 NOTE — Progress Notes (Deleted)
CC: COPD exacerbation follow-up  HPI:  Kim [name] is a 62 y.o. female with a past medical history of hypertension, COPD, hyperlipidemia who presents for 1 week follow-up for COPD exacerbation.  COPD: Albuterol, Incruse Ellipta, Dulera, azithromycin Hypertension: Amlodipine 5 mg daily Hyperlipidemia: Pravastatin 20 mg daily  Patient was seen last week for COPD exacerbation.  Patient was managed with prednisone, azithromycin, and Tessalon Perles.  Patient followed up today to see how she is doing.  At that time, patient was satting 94% on 3 L as well as able to speak in full sentences.  Patient had no indication for hospitalization at that time.  Patient also had Dulera and Incruse refilled.  Will see how the patient is doing today.  Past Medical History:  Diagnosis Date   COPD (chronic obstructive pulmonary disease) (HCC)    High cholesterol    Hypertension    Smoker      Current Outpatient Medications:    acetaminophen (TYLENOL) 500 MG tablet, Take 500 mg by mouth 3 (three) times daily as needed (pain)., Disp: , Rfl:    albuterol (VENTOLIN HFA) 108 (90 Base) MCG/ACT inhaler, Inhale 2 puffs into the lungs every 4 (four) hours as needed for wheezing or shortness of breath., Disp: 18 g, Rfl: 2   amLODipine (NORVASC) 5 MG tablet, Take 1 tablet (5 mg total) by mouth daily., Disp: 30 tablet, Rfl: 1   azithromycin (ZITHROMAX) 500 MG tablet, Take 1 tablet (500 mg total) by mouth daily., Disp: 3 tablet, Rfl: 0   benzonatate (TESSALON PERLES) 100 MG capsule, Take 1 capsule (100 mg total) by mouth 3 (three) times daily as needed for cough., Disp: 20 capsule, Rfl: 0   Calcium Carbonate Antacid (TUMS PO), Take 2 tablets by mouth 2 (two) times daily as needed (heartburn)., Disp: , Rfl:    mometasone-formoterol (DULERA) 100-5 MCG/ACT AERO, Inhale 2 puffs into the lungs daily., Disp: 1 each, Rfl: 11   polyethylene glycol powder (GLYCOLAX/MIRALAX) 17 GM/SCOOP powder, Take 17 g by mouth  daily., Disp: 238 g, Rfl: 0   pravastatin (PRAVACHOL) 20 MG tablet, TAKE 1 TABLET BY MOUTH EVERYDAY AT BEDTIME, Disp: 90 tablet, Rfl: 0   senna-docusate (SENOKOT-S) 8.6-50 MG tablet, Take 1 tablet by mouth at bedtime as needed for mild constipation., Disp: 2 tablet, Rfl: 0   triamcinolone (KENALOG) 0.025 % ointment, Apply 1 Application topically 2 (two) times daily as needed (Itching)., Disp: 30 g, Rfl: 0   umeclidinium bromide (INCRUSE ELLIPTA) 62.5 MCG/ACT AEPB, Inhale 1 puff into the lungs daily., Disp: 30 each, Rfl: 11  Review of Systems:  ***  Constitutional: Eye: Respiratory: Cardiovascular: GI: MSK: GU: Skin: Neuro: Endocrine:   Physical Exam:  There were no vitals filed for this visit. *** General: Patient is sitting comfortably in the room  Eyes: Pupils equal and reactive to light, EOM intact  Head: Normocephalic, atraumatic  Neck: Supple, nontender, full range of motion, No JVD Cardio: Regular rate and rhythm, no murmurs, rubs or gallops. 2+ pulses to bilateral upper and lower extremities  Chest: No chest tenderness Pulmonary: Clear to ausculation bilaterally with no rales, rhonchi, and crackles  Abdomen: Soft, nontender with normoactive bowel sounds with no rebound or guarding  Neuro: Alert and orientated x3. CN II-XII intact. Sensation intact to upper and lower extremities. 2+ patellar reflex.  Back: No midline tenderness, no step off or deformities noted. No paraspinal muscle tenderness.  Skin: No rashes noted  MSK: 5/5 strength to upper and lower  extremities.    Assessment & Plan:   No problem-specific Assessment & Plan notes found for this encounter.    Patient {GC/GE:3044014::"discussed with","seen with"} Dr. {NAMES:[id number]::"Guilloud","Hoffman","Mullen","Narendra","Williams","Vincent"}  Modena Slater, DO PGY-1 Internal Medicine Resident  Pager: [phone]

## 2023-03-19 DIAGNOSIS — R531 Weakness: Secondary | ICD-10-CM | POA: Diagnosis not present

## 2023-04-01 DIAGNOSIS — Z419 Encounter for procedure for purposes other than remedying health state, unspecified: Secondary | ICD-10-CM | POA: Diagnosis not present

## 2023-04-10 ENCOUNTER — Telehealth: Payer: Self-pay

## 2023-04-10 NOTE — Telephone Encounter (Signed)
Lm for patient to ask if she has had previous sleep study.  

## 2023-04-11 ENCOUNTER — Institutional Professional Consult (permissible substitution): Payer: Medicaid Other | Admitting: Internal Medicine

## 2023-04-11 NOTE — Telephone Encounter (Signed)
Patient did not show up for her appointment today with Dr. Belia Heman.  Nothing further needed.

## 2023-04-19 DIAGNOSIS — R531 Weakness: Secondary | ICD-10-CM | POA: Diagnosis not present

## 2023-05-01 DIAGNOSIS — Z419 Encounter for procedure for purposes other than remedying health state, unspecified: Secondary | ICD-10-CM | POA: Diagnosis not present

## 2023-05-19 DIAGNOSIS — R531 Weakness: Secondary | ICD-10-CM | POA: Diagnosis not present

## 2023-06-01 DIAGNOSIS — Z419 Encounter for procedure for purposes other than remedying health state, unspecified: Secondary | ICD-10-CM | POA: Diagnosis not present

## 2023-06-19 DIAGNOSIS — R531 Weakness: Secondary | ICD-10-CM | POA: Diagnosis not present

## 2023-07-02 DIAGNOSIS — Z419 Encounter for procedure for purposes other than remedying health state, unspecified: Secondary | ICD-10-CM | POA: Diagnosis not present

## 2023-07-20 DIAGNOSIS — R531 Weakness: Secondary | ICD-10-CM | POA: Diagnosis not present

## 2023-08-01 DIAGNOSIS — Z419 Encounter for procedure for purposes other than remedying health state, unspecified: Secondary | ICD-10-CM | POA: Diagnosis not present

## 2023-08-19 DIAGNOSIS — R531 Weakness: Secondary | ICD-10-CM | POA: Diagnosis not present

## 2023-09-01 DIAGNOSIS — Z419 Encounter for procedure for purposes other than remedying health state, unspecified: Secondary | ICD-10-CM | POA: Diagnosis not present

## 2023-09-07 ENCOUNTER — Ambulatory Visit (INDEPENDENT_AMBULATORY_CARE_PROVIDER_SITE_OTHER): Payer: Medicaid Other | Admitting: Student

## 2023-09-07 VITALS — BP 113/73 | HR 86 | Temp 97.5°F | Ht 66.0 in | Wt 198.0 lb

## 2023-09-07 DIAGNOSIS — R1013 Epigastric pain: Secondary | ICD-10-CM | POA: Diagnosis not present

## 2023-09-07 DIAGNOSIS — R5383 Other fatigue: Secondary | ICD-10-CM

## 2023-09-07 MED ORDER — PANTOPRAZOLE SODIUM 40 MG PO TBEC: 40.0000 mg | DELAYED_RELEASE_TABLET | Freq: Every day | ORAL | 1 refills | Status: DC

## 2023-09-07 MED ORDER — ONDANSETRON HCL 4 MG PO TABS: 4.0000 mg | ORAL_TABLET | Freq: Every day | ORAL | 1 refills | Status: DC | PRN

## 2023-09-07 NOTE — Progress Notes (Signed)
CC: Dyspepsia   HPI: [name] is a 62 y.o. female living with a history stated below and presents today for dyspepsia. Please see problem based assessment and plan for additional details.  Past Medical History:  Diagnosis Date   COPD (chronic obstructive pulmonary disease) (HCC)    High cholesterol    Hypertension    Smoker     Current Outpatient Medications on File Prior to Visit  Medication Sig Dispense Refill   acetaminophen (TYLENOL) 500 MG tablet Take 500 mg by mouth 3 (three) times daily as needed (pain).     albuterol (VENTOLIN HFA) 108 (90 Base) MCG/ACT inhaler Inhale 2 puffs into the lungs every 4 (four) hours as needed for wheezing or shortness of breath. 18 g 2   amLODipine (NORVASC) 5 MG tablet Take 1 tablet (5 mg total) by mouth daily. 30 tablet 1   azithromycin (ZITHROMAX) 500 MG tablet Take 1 tablet (500 mg total) by mouth daily. 3 tablet 0   benzonatate (TESSALON PERLES) 100 MG capsule Take 1 capsule (100 mg total) by mouth 3 (three) times daily as needed for cough. 20 capsule 0   Calcium Carbonate Antacid (TUMS PO) Take 2 tablets by mouth 2 (two) times daily as needed (heartburn).     mometasone-formoterol (DULERA) 100-5 MCG/ACT AERO Inhale 2 puffs into the lungs daily. 1 each 11   polyethylene glycol powder (GLYCOLAX/MIRALAX) 17 GM/SCOOP powder Take 17 g by mouth daily. 238 g 0   pravastatin (PRAVACHOL) 20 MG tablet TAKE 1 TABLET BY MOUTH EVERYDAY AT BEDTIME 90 tablet 0   senna-docusate (SENOKOT-S) 8.6-50 MG tablet Take 1 tablet by mouth at bedtime as needed for mild constipation. 2 tablet 0   triamcinolone (KENALOG) 0.025 % ointment Apply 1 Application topically 2 (two) times daily as needed (Itching). 30 g 0   umeclidinium bromide (INCRUSE ELLIPTA) 62.5 MCG/ACT AEPB Inhale 1 puff into the lungs daily. 30 each 11   No current facility-administered medications on file prior to visit.    Family History  Problem Relation Age of Onset   CAD Mother      Social History   Socioeconomic History   Marital status: Single    Spouse name: Not on file   Number of children: Not on file   Years of education: Not on file   Highest education level: Not on file  Occupational History   Not on file  Tobacco Use   Smoking status: Every Day    Current packs/day: 1.00    Types: Cigarettes   Smokeless tobacco: Never  Substance and Sexual Activity   Alcohol use: Not Currently   Drug use: Not Currently   Sexual activity: Yes  Other Topics Concern   Not on file  Social History Narrative   Not on file   Social Determinants of Health   Financial Resource Strain: Not on file  Food Insecurity: Not on file  Transportation Needs: Not on file  Physical Activity: Not on file  Stress: Not on file  Social Connections: Not on file  Intimate Partner Violence: Not on file    Review of Systems: ROS negative except for what is noted on the assessment and plan.  Vitals:   09/07/23 1531  BP: 113/73  Pulse: 86  Temp: (!) 97.5 F (36.4 C)  TempSrc: Oral  SpO2: 90%  Weight: 198 lb (89.8 kg)  Height: 5\' 6"  (1.676 m)    Physical Exam: Constitutional: well-appearing in no acute distress HENT: normocephalic atraumatic, mucous  membranes moist Eyes: conjunctiva non-erythematous Neck: supple Cardiovascular: regular rate and rhythm, no m/r/g Pulmonary/Chest: normal work of breathing on room air, lungs clear to auscultation bilaterally Abdominal: soft, mild epigastric tenderness MSK: normal bulk and tone Neurological: alert & oriented x 3, 5/5 strength in bilateral upper and lower extremities, normal gait Skin: warm and dry  Assessment & Plan:   Dyspepsia Patient endorses nausea, vomiting, and loss of appetite for the past month.  She feels that over the course the month, this has been worsening.  Her prior baseline was negative for any nausea, but now she feels nauseous every day.  She denies any inciting factors but notes that she feels better  in the morning.  She has had a weight loss of roughly 40 pounds for the past 6 months that has been unintentional.  Her last vomiting episode was 2 days ago, and it was clear in appearance.  No signs of bilious vomiting.  She also has had some diarrhea but denies any changes to her diet, dysphagia, or odynophagia.  She denies any fever or night sweats.  She has not tried any medications from the store.  She also endorses new lightheadedness and dizziness.  She denies any changes in mood but feels she is more tired than usual.  PHQ-9 notable for moderate depression with a score 10.  Due to unintentional weight loss and her age, will refer to gastroenterology for further workup.  Patient will also be provided Zofran for as needed nausea alleviation. - GI referral - Follow-up CBC, CMP, TSH, folate, B12 - As needed Zofran - Begin Protonix 40 mg once per day  Patient seen with Dr. Bary Leriche, MD  [facility] Medicine, PGY-1 Date 09/09/2023 Time 9:08 PM

## 2023-09-07 NOTE — Patient Instructions (Signed)
Thank you so much for coming to the clinic today!   I have placed a referral for GI to further workup up your loss of appetite and nausea. If you continue to feel nauseous, I have put in an order for Zofran to help as needed.   I will also follow up with your labs once these come back.   If you have any questions please feel free to the call the clinic at anytime at [phone]. It was a pleasure seeing you!  Best, Dr. Rayvon Char

## 2023-09-09 DIAGNOSIS — R110 Nausea: Secondary | ICD-10-CM | POA: Insufficient documentation

## 2023-09-09 DIAGNOSIS — R1013 Epigastric pain: Secondary | ICD-10-CM | POA: Insufficient documentation

## 2023-09-09 LAB — CMP14 + ANION GAP
ALT: 7 IU/L (ref 0–32)
AST: 16 IU/L (ref 0–40)
Albumin: 3.7 g/dL — ABNORMAL LOW (ref 3.9–4.9)
Alkaline Phosphatase: 139 IU/L — ABNORMAL HIGH (ref 44–121)
Anion Gap: 18 mmol/L (ref 10.0–18.0)
BUN/Creatinine Ratio: 8 — ABNORMAL LOW (ref 12–28)
BUN: 10 mg/dL (ref 8–27)
Bilirubin Total: 0.7 mg/dL (ref 0.0–1.2)
CO2: 24 mmol/L (ref 20–29)
Calcium: 9.5 mg/dL (ref 8.7–10.3)
Chloride: 95 mmol/L — ABNORMAL LOW (ref 96–106)
Creatinine, Ser: 1.23 mg/dL — ABNORMAL HIGH (ref 0.57–1.00)
Globulin, Total: 3.5 g/dL (ref 1.5–4.5)
Glucose: 88 mg/dL (ref 70–99)
Potassium: 3.5 mmol/L (ref 3.5–5.2)
Sodium: 137 mmol/L (ref 134–144)
Total Protein: 7.2 g/dL (ref 6.0–8.5)
eGFR: 50 mL/min/{1.73_m2} — ABNORMAL LOW (ref >59)

## 2023-09-09 LAB — CBC
Hematocrit: 56.2 % — ABNORMAL HIGH (ref 34.0–46.6)
Hemoglobin: 18.7 g/dL — ABNORMAL HIGH (ref 11.1–15.9)
MCH: 31.1 pg (ref 26.6–33.0)
MCHC: 33.3 g/dL (ref 31.5–35.7)
MCV: 93 fL (ref 79–97)
Platelets: 237 10*3/uL (ref 150–450)
RBC: 6.02 x10E6/uL — ABNORMAL HIGH (ref 3.77–5.28)
RDW: 21.5 % — ABNORMAL HIGH (ref 11.7–15.4)
WBC: 11.3 10*3/uL — ABNORMAL HIGH (ref 3.4–10.8)

## 2023-09-09 LAB — VITAMIN B12: Vitamin B-12: 540 pg/mL (ref 232–1245)

## 2023-09-09 LAB — FOLATE: Folate: 2.6 ng/mL — ABNORMAL LOW (ref >3.0)

## 2023-09-09 LAB — TSH: TSH: 4.48 u[IU]/mL (ref 0.450–4.500)

## 2023-09-09 NOTE — Assessment & Plan Note (Signed)
>>  ASSESSMENT AND PLAN FOR DYSPEPSIA WRITTEN ON 09/09/2023  9:08 PM BY Morrie Sheldon, MD  Patient endorses nausea, vomiting, and loss of appetite for the past month.  She feels that over the course the month, this has been worsening.  Her prior baseline was negative for any nausea, but now she feels nauseous every day.  She denies any inciting factors but notes that she feels better in the morning.  She has had a weight loss of roughly 40 pounds for the past 6 months that has been unintentional.  Her last vomiting episode was 2 days ago, and it was clear in appearance.  No signs of bilious vomiting.  She also has had some diarrhea but denies any changes to her diet, dysphagia, or odynophagia.  She denies any fever or night sweats.  She has not tried any medications from the store.  She also endorses new lightheadedness and dizziness.  She denies any changes in mood but feels she is more tired than usual.  PHQ-9 notable for moderate depression with a score 10.  Due to unintentional weight loss and her age, will refer to gastroenterology for further workup.  Patient will also be provided Zofran for as needed nausea alleviation. - GI referral - Follow-up CBC, CMP, TSH, folate, B12 - As needed Zofran - Begin Protonix 40 mg once per day

## 2023-09-09 NOTE — Assessment & Plan Note (Signed)
Patient endorses nausea, vomiting, and loss of appetite for the past month.  She feels that over the course the month, this has been worsening.  Her prior baseline was negative for any nausea, but now she feels nauseous every day.  She denies any inciting factors but notes that she feels better in the morning.  She has had a weight loss of roughly 40 pounds for the past 6 months that has been unintentional.  Her last vomiting episode was 2 days ago, and it was clear in appearance.  No signs of bilious vomiting.  She also has had some diarrhea but denies any changes to her diet, dysphagia, or odynophagia.  She denies any fever or night sweats.  She has not tried any medications from the store.  She also endorses new lightheadedness and dizziness.  She denies any changes in mood but feels she is more tired than usual.  PHQ-9 notable for moderate depression with a score 10.  Due to unintentional weight loss and her age, will refer to gastroenterology for further workup.  Patient will also be provided Zofran for as needed nausea alleviation. - GI referral - Follow-up CBC, CMP, TSH, folate, B12 - As needed Zofran - Begin Protonix 40 mg once per day

## 2023-09-10 NOTE — Progress Notes (Signed)
Internal Medicine Clinic Attending  I was physically present during the key portions of the resident provided service and participated in the medical decision making of patient's management care. I reviewed pertinent patient test results.  The assessment, diagnosis, and plan were formulated together and I agree with the documentation in the resident's note.  Reymundo Poll, MD

## 2023-09-11 ENCOUNTER — Other Ambulatory Visit: Payer: Self-pay | Admitting: Student

## 2023-09-11 ENCOUNTER — Telehealth: Payer: Self-pay

## 2023-09-11 DIAGNOSIS — E538 Deficiency of other specified B group vitamins: Secondary | ICD-10-CM

## 2023-09-11 MED ORDER — FOLIC ACID 1 MG PO TABS: 1.0000 mg | ORAL_TABLET | Freq: Every day | ORAL | 3 refills | Status: DC

## 2023-09-11 NOTE — Progress Notes (Signed)
Called patient to discuss results. CBC largely stable from 11 months ago. Slight increase in creatinine so will continue to monitor. Folate low so will begin supplementation and continue to follow-up. Alk phosph also slightly high and patient is to follow-up with GI in context of dyspepsia.

## 2023-09-11 NOTE — Telephone Encounter (Signed)
Requesting lab results, please call pt back.  

## 2023-09-19 DIAGNOSIS — R531 Weakness: Secondary | ICD-10-CM | POA: Diagnosis not present

## 2023-09-22 ENCOUNTER — Encounter: Payer: Self-pay | Admitting: Pediatrics

## 2023-09-30 ENCOUNTER — Other Ambulatory Visit: Payer: Self-pay | Admitting: Student

## 2023-09-30 DIAGNOSIS — R1013 Epigastric pain: Secondary | ICD-10-CM

## 2023-10-01 DIAGNOSIS — Z419 Encounter for procedure for purposes other than remedying health state, unspecified: Secondary | ICD-10-CM | POA: Diagnosis not present

## 2023-10-09 ENCOUNTER — Telehealth: Payer: Self-pay | Admitting: *Deleted

## 2023-10-09 NOTE — Telephone Encounter (Signed)
Call from patient states she has been feeling bad since her discharge.  Has been feeling cold. Unsure if she has had fevers.  Very weak and losing weight.  Has been feeling bag since list visit 1.5 months ago.  No available appointments in the Clinics.  Patient stated that she needs to be admitted.  Due to the time of day nearly 4PM today.  Offered option of going to the ER.  Stated does not want to go to the ER and wait. Patient agreed to see if some one will call her to talk about an admission.  [phone].

## 2023-10-10 ENCOUNTER — Encounter: Payer: Medicaid Other | Admitting: Student

## 2023-10-10 NOTE — Telephone Encounter (Signed)
RTC to patient to offer appointment for 10:45 AM today.  Stated could she get something later or tomorrow.  Given appointment for 1:15 pm on 10/11/2023.

## 2023-10-11 ENCOUNTER — Encounter: Payer: Self-pay | Admitting: Student

## 2023-10-11 ENCOUNTER — Ambulatory Visit: Payer: Medicaid Other | Admitting: Student

## 2023-10-11 VITALS — BP 113/84 | HR 105 | Temp 98.1°F | Ht 66.0 in | Wt 186.8 lb

## 2023-10-11 DIAGNOSIS — R110 Nausea: Secondary | ICD-10-CM | POA: Insufficient documentation

## 2023-10-11 DIAGNOSIS — G629 Polyneuropathy, unspecified: Secondary | ICD-10-CM | POA: Diagnosis not present

## 2023-10-11 DIAGNOSIS — R112 Nausea with vomiting, unspecified: Secondary | ICD-10-CM | POA: Diagnosis not present

## 2023-10-11 LAB — COMPREHENSIVE METABOLIC PANEL
ALT: 10 U/L (ref 0–44)
AST: 19 U/L (ref 15–41)
Albumin: 3.1 g/dL — ABNORMAL LOW (ref 3.5–5.0)
Alkaline Phosphatase: 106 U/L (ref 38–126)
Anion gap: 10 (ref 5–15)
BUN: 10 mg/dL (ref 8–23)
CO2: 29 mmol/L (ref 22–32)
Calcium: 9.5 mg/dL (ref 8.9–10.3)
Chloride: 94 mmol/L — ABNORMAL LOW (ref 98–111)
Creatinine, Ser: 1.18 mg/dL — ABNORMAL HIGH (ref 0.44–1.00)
GFR, Estimated: 52 mL/min — ABNORMAL LOW (ref >60)
Glucose, Bld: 99 mg/dL (ref 70–99)
Potassium: 3.9 mmol/L (ref 3.5–5.1)
Sodium: 133 mmol/L — ABNORMAL LOW (ref 135–145)
Total Bilirubin: 1.3 mg/dL — ABNORMAL HIGH (ref <1.2)
Total Protein: 7.3 g/dL (ref 6.5–8.1)

## 2023-10-11 LAB — CBC WITH DIFFERENTIAL/PLATELET
Abs Immature Granulocytes: 0.05 10*3/uL (ref 0.00–0.07)
Basophils Absolute: 0.1 10*3/uL (ref 0.0–0.1)
Basophils Relative: 1 %
Eosinophils Absolute: 0.1 10*3/uL (ref 0.0–0.5)
Eosinophils Relative: 1 %
HCT: 54.2 % — ABNORMAL HIGH (ref 36.0–46.0)
Hemoglobin: 18 g/dL — ABNORMAL HIGH (ref 12.0–15.0)
Immature Granulocytes: 1 %
Lymphocytes Relative: 21 %
Lymphs Abs: 2.2 10*3/uL (ref 0.7–4.0)
MCH: 33.1 pg (ref 26.0–34.0)
MCHC: 33.2 g/dL (ref 30.0–36.0)
MCV: 99.8 fL (ref 80.0–100.0)
Monocytes Absolute: 0.7 10*3/uL (ref 0.1–1.0)
Monocytes Relative: 6 %
Neutro Abs: 7.5 10*3/uL (ref 1.7–7.7)
Neutrophils Relative %: 70 %
Platelets: 210 10*3/uL (ref 150–400)
RBC: 5.43 MIL/uL — ABNORMAL HIGH (ref 3.87–5.11)
RDW: 17.8 % — ABNORMAL HIGH (ref 11.5–15.5)
WBC: 10.6 10*3/uL — ABNORMAL HIGH (ref 4.0–10.5)
nRBC: 0 % (ref 0.0–0.2)

## 2023-10-11 LAB — HEMOGLOBIN A1C
Hgb A1c MFr Bld: 5.2 % (ref 4.8–5.6)
Mean Plasma Glucose: 102.54 mg/dL

## 2023-10-11 MED ORDER — GABAPENTIN 300 MG PO CAPS: 300.0000 mg | ORAL_CAPSULE | Freq: Three times a day (TID) | ORAL | 0 refills | Status: DC

## 2023-10-11 MED ORDER — MIRTAZAPINE 15 MG PO TABS: 15.0000 mg | ORAL_TABLET | Freq: Every day | ORAL | 0 refills | Status: DC

## 2023-10-11 MED ORDER — ONDANSETRON HCL 4 MG PO TABS: 4.0000 mg | ORAL_TABLET | Freq: Three times a day (TID) | ORAL | 0 refills | Status: DC | PRN

## 2023-10-11 NOTE — Progress Notes (Signed)
CC: nausea and unintentional weight loss  HPI:  [name] is a 62 y.o. female living with a history stated below and presents today for an acute visit for nausea and unintentional weight loss. Please see problem based assessment and plan for additional details.  Past Medical History:  Diagnosis Date   COPD (chronic obstructive pulmonary disease) (HCC)    High cholesterol    Hypertension    Smoker     Current Outpatient Medications on File Prior to Visit  Medication Sig Dispense Refill   acetaminophen (TYLENOL) 500 MG tablet Take 500 mg by mouth 3 (three) times daily as needed (pain).     albuterol (VENTOLIN HFA) 108 (90 Base) MCG/ACT inhaler Inhale 2 puffs into the lungs every 4 (four) hours as needed for wheezing or shortness of breath. 18 g 2   amLODipine (NORVASC) 5 MG tablet Take 1 tablet (5 mg total) by mouth daily. 30 tablet 1   azithromycin (ZITHROMAX) 500 MG tablet Take 1 tablet (500 mg total) by mouth daily. 3 tablet 0   benzonatate (TESSALON PERLES) 100 MG capsule Take 1 capsule (100 mg total) by mouth 3 (three) times daily as needed for cough. 20 capsule 0   Calcium Carbonate Antacid (TUMS PO) Take 2 tablets by mouth 2 (two) times daily as needed (heartburn).     folic acid (FOLVITE) 1 MG tablet Take 1 tablet (1 mg total) by mouth daily. 30 tablet 3   mometasone-formoterol (DULERA) 100-5 MCG/ACT AERO Inhale 2 puffs into the lungs daily. 1 each 11   pantoprazole (PROTONIX) 40 MG tablet TAKE 1 TABLET BY MOUTH EVERY DAY 90 tablet 1   polyethylene glycol powder (GLYCOLAX/MIRALAX) 17 GM/SCOOP powder Take 17 g by mouth daily. 238 g 0   pravastatin (PRAVACHOL) 20 MG tablet TAKE 1 TABLET BY MOUTH EVERYDAY AT BEDTIME 90 tablet 0   senna-docusate (SENOKOT-S) 8.6-50 MG tablet Take 1 tablet by mouth at bedtime as needed for mild constipation. 2 tablet 0   triamcinolone (KENALOG) 0.025 % ointment Apply 1 Application topically 2 (two) times daily as needed (Itching). 30 g 0    umeclidinium bromide (INCRUSE ELLIPTA) 62.5 MCG/ACT AEPB Inhale 1 puff into the lungs daily. 30 each 11   No current facility-administered medications on file prior to visit.    Family History  Problem Relation Age of Onset   CAD Mother    Social History   Socioeconomic History   Marital status: Single    Spouse name: Not on file   Number of children: Not on file   Years of education: Not on file   Highest education level: Not on file  Occupational History   Not on file  Tobacco Use   Smoking status: Every Day    Current packs/day: 1.00    Types: Cigarettes   Smokeless tobacco: Never  Substance and Sexual Activity   Alcohol use: Not Currently   Drug use: Not Currently   Sexual activity: Yes  Other Topics Concern   Not on file  Social History Narrative   Not on file   Social Drivers of Health   Financial Resource Strain: Not on file  Food Insecurity: Not on file  Transportation Needs: Not on file  Physical Activity: Not on file  Stress: Not on file  Social Connections: Not on file  Intimate Partner Violence: Not on file   Review of Systems: ROS negative except for what is noted on the assessment and plan.  Vitals:   10/11/23  1311 10/11/23 1407 10/11/23 1409 10/11/23 1411  BP: 130/80 112/65 122/85 113/84  Pulse: 97 78 91 (!) 105  Temp: 98.1 F (36.7 C)     TempSrc: Oral     SpO2: 97%     Weight: 186 lb 12.8 oz (84.7 kg)     Height: 5\' 6"  (1.676 m)      Physical Exam: Constitutional: fatigued appearing, hunched over in her wheelchair, husband by her side Cardiovascular: regular rate and rhythm, no murmurs; no LE edema Pulmonary/Chest: CTA bilaterally, normal respiratory effort and rate; no wheezes or crackles Abdominal: soft, no distension, no tenderness to deep palpation; no hepatosplenomegaly; bowel sounds present Neurological: alert & oriented x 3, no focal deficits appreciated Psych: somber mood, normal behavior  Assessment & Plan:   Patient seen  with Dr. Mayford Knife  Intractable nausea Patient presents today with intractable nausea for the past month and a half or so. She also reports vomiting (1-2 times per week) and unintentional weight loss.  She was previously evaluated in our clinic on 11/7 for dyspepsia, for which she was given a short course of Zofran and was referred to GI.  She has an appointment scheduled with GI on January 14th.   She states that she has felt persistent nausea for weeks which has been preventing her from eating. She is able to drink fluids and says she has been drinking plenty.  However, she cannot remember the last time she ate a whole meal. She tried to eat one last night and threw it all up.  She has been mostly sticking to soft foods like ice cream and pudding, which she says she is able to tolerate.  She has been using the Zofran daily but has remained nauseous. Her vomiting is only occasional and is non-bloody, non-bilious. She has been having diarrhea too, but thinks it is because she is not eating any substantial food. She denies any blood in her stool or dark stools. She denies any recent fevers, chills, sick contacts, or abdominal pain. Abdominal exam was unremarkable.    She has progressively lost weight since last year, unintentionally. Per chart review, it looks like she has lost over 60 lbs since last year, including 8 lbs in the past month.  She has noticed this herself and she is very concerned she may have cancer.   I ordered a stat CBC and CMP today, which came back relatively normal.  Her creatinine was elevated at 1.18, but this was improved from her prior labs in November.  She was slightly hyponatremic with a sodium of 133, which is expected in the setting of poor oral intake.  Her electrolytes were otherwise unremarkable.  She has a very mild leukocytosis, which is also improved from her previous labs, and polycythemia, which is likely due to her chronic tobacco use. Her labs and exam were overall  reassuring.   I am very concerned about her intractable nausea and unintentional weight loss over the past months to years. I spoke with her and her husband about being admitted to the hospital vs. returning home. For now, we have decided together to try to treat her symptoms at home and we will order imaging for further evaluation.   Plan - Increased Zofran to 8 mg twice daily or 4 mg every 8 hours, whichever works best for the patient - Ordered a stat CT abdomen/pelvis with contrast - Started mirtazapine 15 mg nightly for appetite and mood - Spoke with patient about the importance of maintaining  adequate nutrition.  Husband says he will help encourage her eating.  Recommended she eat multiple small meals throughout the day instead of large meals. - Follow-up with gastroenterology in January - Follow-up in our clinic in 3 weeks - Gave her return precautions  Polyneuropathy She reports neuropathic pain in her feet and the hands, which has been going on for some time.  She does not have diabetes.  She denies any alcohol use.  TSH was within normal limits.  Vitamin B12 was normal.  She did have low folate and was prescribed folate supplementation.  She has tried rubbing a cream on it which has helped somewhat, but she is not sure which cream.  She says sometimes the pain keeps her up at night. She has never tried any medications for it, which I feel she will benefit from.   Plan -Gabapentin 300 mg 3 times daily for neuropathic pain  Annett Fabian, MD  [facility] Internal Medicine, PGY-1 Phone: [phone] Date 10/12/2023 Time 8:16 AM

## 2023-10-11 NOTE — Patient Instructions (Signed)
Thank you, Kim [name] for allowing Kim [name] to provide your care today. Today we discussed your nausea/vomiting, weight loss, and neuropathy.  For your nausea and vomiting, I have prescribed Zofran.  You may take up to 8 mg (2 tablets) every 12 hours, or 4 mg (1 tablet) every 8 hours as needed.  Please also try to eat small meals and foods that you are able to tolerate.  It is important for you to make sure you are getting enough food and fluids. I have ordered a CT scan of your abdomen.  They should be calling you soon to schedule that appointment.  As soon as we get the results of the scan, I will give you a call.  Please Kim [name] be sure to make it to your appointment with a gastroenterologist in mid January.  For your nerve pain, I have prescribed a medication called gabapentin, which you can take up to 3 times daily to help with your nerve pain.  I have also prescribed you a medication called mirtazapine (Remeron), which is a medication to help increase your appetite and improve your mood.  Please take this medication once nightly.  If you remain unable to tolerate any food for weeks, your nausea and vomiting worsens, or you experience any new lightheadedness, chest pain, dizziness, or blood in your stool, please give our clinic a call or go to the nearest emergency department for evaluation.   I have ordered the following labs for you:   Lab Orders         CBC with Diff         Hemoglobin A1c         CMP w Anion Gap (STAT/Sunquest-performed on-site)      I have ordered the following medication/changed the following medications:   Start the following medications: Meds ordered this encounter  Medications   mirtazapine (REMERON) 15 MG tablet    Sig: Take 1 tablet (15 mg total) by mouth at bedtime.    Dispense:  60 tablet    Refill:  0   ondansetron (ZOFRAN) 4 MG tablet    Sig: Take 1 tablet (4 mg total) by mouth every 8 (eight) hours as needed for nausea or vomiting.    Dispense:  20  tablet    Refill:  0   gabapentin (NEURONTIN) 300 MG capsule    Sig: Take 1 capsule (300 mg total) by mouth 3 (three) times daily.    Dispense:  90 capsule    Refill:  0     Follow up: 3 weeks  We look forward to seeing you next time. Please call our clinic at [phone] if you have any questions or concerns. The best time to call is Monday-Friday from 9am-4pm, but there is someone available 24/7. If after hours or the weekend, call the main hospital number and ask for the Internal Medicine Resident On-Call. If you need medication refills, please notify your pharmacy one week in advance and they will send Kim [name] a request.   Thank you for trusting me with your care. Wishing you the best!   Annett Fabian, MD [facility]

## 2023-10-11 NOTE — Assessment & Plan Note (Addendum)
She reports neuropathic pain in her feet and the hands, which has been going on for some time.  She does not have diabetes.  She denies any alcohol use.  TSH was within normal limits.  Vitamin B12 was normal.  She did have low folate and was prescribed folate supplementation.  She has tried rubbing a cream on it which has helped somewhat, but she is not sure which cream.  She says sometimes the pain keeps her up at night. She has never tried any medications for it, which I feel she will benefit from.   Plan -Gabapentin 300 mg 3 times daily for neuropathic pain

## 2023-10-11 NOTE — Assessment & Plan Note (Addendum)
Patient presents today with intractable nausea for the past month and a half or so. She also reports vomiting (1-2 times per week) and unintentional weight loss.  She was previously evaluated in our clinic on 11/7 for dyspepsia, for which she was given a short course of Zofran and was referred to GI.  She has an appointment scheduled with GI on January 14th.   She states that she has felt persistent nausea for weeks which has been preventing her from eating. She is able to drink fluids and says she has been drinking plenty.  However, she cannot remember the last time she ate a whole meal. She tried to eat one last night and threw it all up.  She has been mostly sticking to soft foods like ice cream and pudding, which she says she is able to tolerate.  She has been using the Zofran daily but has remained nauseous. Her vomiting is only occasional and is non-bloody, non-bilious. She has been having diarrhea too, but thinks it is because she is not eating any substantial food. She denies any blood in her stool or dark stools. She denies any recent fevers, chills, sick contacts, or abdominal pain. Abdominal exam was unremarkable.    She has progressively lost weight since last year, unintentionally. Per chart review, it looks like she has lost over 60 lbs since last year, including 8 lbs in the past month.  She has noticed this herself and she is very concerned she may have cancer.   I ordered a stat CBC and CMP today, which came back relatively normal.  Her creatinine was elevated at 1.18, but this was improved from her prior labs in November.  She was slightly hyponatremic with a sodium of 133, which is expected in the setting of poor oral intake.  Her electrolytes were otherwise unremarkable.  She has a very mild leukocytosis, which is also improved from her previous labs, and polycythemia, which is likely due to her chronic tobacco use. Her labs and exam were overall reassuring.   I am very concerned about  her intractable nausea and unintentional weight loss over the past months to years. I spoke with her and her husband about being admitted to the hospital vs. returning home. For now, we have decided together to try to treat her symptoms at home and we will order imaging for further evaluation.   Plan - Increased Zofran to 8 mg twice daily or 4 mg every 8 hours, whichever works best for the patient - Ordered a stat CT abdomen/pelvis with contrast - Started mirtazapine 15 mg nightly for appetite and mood - Spoke with patient about the importance of maintaining adequate nutrition.  Husband says he will help encourage her eating.  Recommended she eat multiple small meals throughout the day instead of large meals. - Follow-up with gastroenterology in January - Follow-up in our clinic in 3 weeks - Gave her return precautions

## 2023-10-12 ENCOUNTER — Ambulatory Visit (HOSPITAL_COMMUNITY): Payer: Medicaid Other

## 2023-10-12 NOTE — Progress Notes (Signed)
Internal Medicine Clinic Attending  I was physically present during the key portions of the resident provided service and participated in the medical decision making of patient's management care. I reviewed pertinent patient test results.  The assessment, diagnosis, and plan were formulated together and I agree with the documentation in the resident's note.  I agree with concern for underlying serious illness and need for urgent imaging to r/o intraabdominal tumor.  She has an unwell appearance though not in distress.  Any worsening symptoms warrant evaluation in ED.  Miguel Aschoff, MD

## 2023-10-18 ENCOUNTER — Inpatient Hospital Stay (HOSPITAL_COMMUNITY)
Admission: AD | Admit: 2023-10-18 | Discharge: 2023-10-20 | DRG: 384 | Disposition: A | Discharge status: Home / Self Care | Payer: Medicaid Other | Source: Ambulatory Visit | Attending: Student in an Organized Health Care Education/Training Program | Admitting: Student in an Organized Health Care Education/Training Program

## 2023-10-18 ENCOUNTER — Encounter (HOSPITAL_COMMUNITY): Payer: Self-pay

## 2023-10-18 ENCOUNTER — Ambulatory Visit: Payer: Medicaid Other | Admitting: Student

## 2023-10-18 ENCOUNTER — Inpatient Hospital Stay (HOSPITAL_COMMUNITY): Payer: Medicaid Other

## 2023-10-18 VITALS — BP 127/71 | HR 83 | Temp 97.5°F | Ht 65.0 in | Wt 184.5 lb

## 2023-10-18 DIAGNOSIS — L89311 Pressure ulcer of right buttock, stage 1: Secondary | ICD-10-CM | POA: Diagnosis not present

## 2023-10-18 DIAGNOSIS — E669 Obesity, unspecified: Secondary | ICD-10-CM | POA: Diagnosis present

## 2023-10-18 DIAGNOSIS — I2510 Atherosclerotic heart disease of native coronary artery without angina pectoris: Secondary | ICD-10-CM | POA: Diagnosis present

## 2023-10-18 DIAGNOSIS — B9681 Helicobacter pylori [H. pylori] as the cause of diseases classified elsewhere: Secondary | ICD-10-CM | POA: Diagnosis not present

## 2023-10-18 DIAGNOSIS — J449 Chronic obstructive pulmonary disease, unspecified: Secondary | ICD-10-CM | POA: Diagnosis not present

## 2023-10-18 DIAGNOSIS — Z6830 Body mass index (BMI) 30.0-30.9, adult: Secondary | ICD-10-CM | POA: Diagnosis not present

## 2023-10-18 DIAGNOSIS — J9611 Chronic respiratory failure with hypoxia: Secondary | ICD-10-CM | POA: Diagnosis not present

## 2023-10-18 DIAGNOSIS — G4733 Obstructive sleep apnea (adult) (pediatric): Secondary | ICD-10-CM | POA: Diagnosis present

## 2023-10-18 DIAGNOSIS — G629 Polyneuropathy, unspecified: Secondary | ICD-10-CM | POA: Diagnosis present

## 2023-10-18 DIAGNOSIS — R110 Nausea: Secondary | ICD-10-CM

## 2023-10-18 DIAGNOSIS — E440 Moderate protein-calorie malnutrition: Secondary | ICD-10-CM | POA: Diagnosis present

## 2023-10-18 DIAGNOSIS — Z681 Body mass index (BMI) 19 or less, adult: Secondary | ICD-10-CM

## 2023-10-18 DIAGNOSIS — R7989 Other specified abnormal findings of blood chemistry: Secondary | ICD-10-CM | POA: Diagnosis not present

## 2023-10-18 DIAGNOSIS — J9610 Chronic respiratory failure, unspecified whether with hypoxia or hypercapnia: Secondary | ICD-10-CM | POA: Diagnosis present

## 2023-10-18 DIAGNOSIS — Z9981 Dependence on supplemental oxygen: Secondary | ICD-10-CM

## 2023-10-18 DIAGNOSIS — E785 Hyperlipidemia, unspecified: Secondary | ICD-10-CM | POA: Diagnosis present

## 2023-10-18 DIAGNOSIS — Z808 Family history of malignant neoplasm of other organs or systems: Secondary | ICD-10-CM

## 2023-10-18 DIAGNOSIS — L8990 Pressure ulcer of unspecified site, unspecified stage: Secondary | ICD-10-CM | POA: Insufficient documentation

## 2023-10-18 DIAGNOSIS — K2950 Unspecified chronic gastritis without bleeding: Secondary | ICD-10-CM | POA: Diagnosis not present

## 2023-10-18 DIAGNOSIS — E7800 Pure hypercholesterolemia, unspecified: Secondary | ICD-10-CM | POA: Diagnosis not present

## 2023-10-18 DIAGNOSIS — R079 Chest pain, unspecified: Secondary | ICD-10-CM | POA: Insufficient documentation

## 2023-10-18 DIAGNOSIS — F17210 Nicotine dependence, cigarettes, uncomplicated: Secondary | ICD-10-CM | POA: Diagnosis present

## 2023-10-18 DIAGNOSIS — K2960 Other gastritis without bleeding: Secondary | ICD-10-CM | POA: Diagnosis present

## 2023-10-18 DIAGNOSIS — Z79899 Other long term (current) drug therapy: Secondary | ICD-10-CM

## 2023-10-18 DIAGNOSIS — E43 Unspecified severe protein-calorie malnutrition: Secondary | ICD-10-CM | POA: Insufficient documentation

## 2023-10-18 DIAGNOSIS — R935 Abnormal findings on diagnostic imaging of other abdominal regions, including retroperitoneum: Secondary | ICD-10-CM | POA: Diagnosis not present

## 2023-10-18 DIAGNOSIS — N1831 Chronic kidney disease, stage 3a: Secondary | ICD-10-CM | POA: Diagnosis not present

## 2023-10-18 DIAGNOSIS — R634 Abnormal weight loss: Secondary | ICD-10-CM | POA: Insufficient documentation

## 2023-10-18 DIAGNOSIS — I7143 Infrarenal abdominal aortic aneurysm, without rupture: Secondary | ICD-10-CM | POA: Diagnosis not present

## 2023-10-18 DIAGNOSIS — K259 Gastric ulcer, unspecified as acute or chronic, without hemorrhage or perforation: Principal | ICD-10-CM | POA: Diagnosis present

## 2023-10-18 DIAGNOSIS — J439 Emphysema, unspecified: Secondary | ICD-10-CM | POA: Diagnosis not present

## 2023-10-18 DIAGNOSIS — L89321 Pressure ulcer of left buttock, stage 1: Secondary | ICD-10-CM | POA: Diagnosis present

## 2023-10-18 DIAGNOSIS — Z8249 Family history of ischemic heart disease and other diseases of the circulatory system: Secondary | ICD-10-CM

## 2023-10-18 DIAGNOSIS — K449 Diaphragmatic hernia without obstruction or gangrene: Secondary | ICD-10-CM | POA: Diagnosis present

## 2023-10-18 DIAGNOSIS — G8929 Other chronic pain: Secondary | ICD-10-CM | POA: Diagnosis present

## 2023-10-18 DIAGNOSIS — I129 Hypertensive chronic kidney disease with stage 1 through stage 4 chronic kidney disease, or unspecified chronic kidney disease: Secondary | ICD-10-CM | POA: Diagnosis present

## 2023-10-18 DIAGNOSIS — K2900 Acute gastritis without bleeding: Secondary | ICD-10-CM

## 2023-10-18 DIAGNOSIS — N183 Chronic kidney disease, stage 3 unspecified: Secondary | ICD-10-CM | POA: Diagnosis present

## 2023-10-18 DIAGNOSIS — Z139 Encounter for screening, unspecified: Secondary | ICD-10-CM

## 2023-10-18 DIAGNOSIS — K5730 Diverticulosis of large intestine without perforation or abscess without bleeding: Secondary | ICD-10-CM | POA: Diagnosis present

## 2023-10-18 DIAGNOSIS — R112 Nausea with vomiting, unspecified: Secondary | ICD-10-CM | POA: Diagnosis not present

## 2023-10-18 DIAGNOSIS — K5900 Constipation, unspecified: Secondary | ICD-10-CM | POA: Diagnosis present

## 2023-10-18 DIAGNOSIS — K279 Peptic ulcer, site unspecified, unspecified as acute or chronic, without hemorrhage or perforation: Secondary | ICD-10-CM | POA: Insufficient documentation

## 2023-10-18 DIAGNOSIS — I10 Essential (primary) hypertension: Secondary | ICD-10-CM | POA: Diagnosis present

## 2023-10-18 DIAGNOSIS — R6881 Early satiety: Secondary | ICD-10-CM | POA: Diagnosis not present

## 2023-10-18 DIAGNOSIS — Z803 Family history of malignant neoplasm of breast: Secondary | ICD-10-CM

## 2023-10-18 LAB — OSMOLALITY: Osmolality: 285 mosm/kg (ref 275–295)

## 2023-10-18 LAB — HIV ANTIBODY (ROUTINE TESTING W REFLEX): HIV Screen 4th Generation wRfx: NONREACTIVE

## 2023-10-18 MED ORDER — COSYNTROPIN 0.25 MG IJ SOLR
0.2500 mg | Freq: Once | INTRAMUSCULAR | Status: AC
  Administered 2023-10-19: 0.25 mg via INTRAVENOUS
  Filled 2023-10-18: qty 0.25

## 2023-10-18 MED ORDER — ALBUTEROL SULFATE (2.5 MG/3ML) 0.083% IN NEBU: 3.0000 mL | INHALATION_SOLUTION | Freq: Four times a day (QID) | RESPIRATORY_TRACT | Status: DC | PRN

## 2023-10-18 MED ORDER — UMECLIDINIUM BROMIDE 62.5 MCG/ACT IN AEPB
1.0000 | INHALATION_SPRAY | Freq: Every day | RESPIRATORY_TRACT | Status: DC
  Filled 2023-10-18: qty 7

## 2023-10-18 MED ORDER — NICOTINE 14 MG/24HR TD PT24
14.0000 mg | MEDICATED_PATCH | Freq: Every day | TRANSDERMAL | Status: DC
  Administered 2023-10-18 – 2023-10-19 (×2): 14 mg via TRANSDERMAL
  Filled 2023-10-18 (×2): qty 1

## 2023-10-18 MED ORDER — ENOXAPARIN SODIUM 40 MG/0.4ML IJ SOSY
40.0000 mg | PREFILLED_SYRINGE | INTRAMUSCULAR | Status: DC
  Administered 2023-10-18 – 2023-10-19 (×2): 40 mg via SUBCUTANEOUS
  Filled 2023-10-18 (×2): qty 0.4

## 2023-10-18 MED ORDER — LORAZEPAM 2 MG/ML IJ SOLN
0.5000 mg | Freq: Once | INTRAMUSCULAR | Status: AC
  Administered 2023-10-18: 0.5 mg via INTRAVENOUS
  Filled 2023-10-18: qty 1

## 2023-10-18 MED ORDER — IOHEXOL 350 MG/ML SOLN
75.0000 mL | Freq: Once | INTRAVENOUS | Status: AC | PRN
  Administered 2023-10-18: 75 mL via INTRAVENOUS

## 2023-10-18 MED ORDER — SODIUM CHLORIDE 0.9% FLUSH
3.0000 mL | Freq: Two times a day (BID) | INTRAVENOUS | Status: DC
  Administered 2023-10-18 – 2023-10-20 (×3): 3 mL via INTRAVENOUS

## 2023-10-18 MED ORDER — ONDANSETRON 4 MG PO TBDP: 4.0000 mg | ORAL_TABLET | Freq: Three times a day (TID) | ORAL | Status: DC | PRN

## 2023-10-18 MED ORDER — PRAVASTATIN SODIUM 40 MG PO TABS
20.0000 mg | ORAL_TABLET | Freq: Every day | ORAL | Status: DC
  Administered 2023-10-19: 20 mg via ORAL
  Filled 2023-10-18: qty 1

## 2023-10-18 NOTE — Progress Notes (Signed)
Established Patient Office Visit  Subjective   Patient ID: Kim [name], female    DOB: 1960/12/23  Age: 62 y.o. MRN: [id number]  Chief Complaint  Patient presents with   Follow-up    Patient here for requesting motorized wheel chair /    HPI This is 62 year old female living with a history stated below and presents today requesting wheelchair. Please see problem based assessment and plan for additional details.   Patient Active Problem List   Diagnosis Date Noted   Rapid weight loss 10/18/2023   Chest pain 10/18/2023   Polyneuropathy 10/11/2023   Intractable nausea 09/09/2023   Encounter for screening involving social determinants of health (SDoH) 06/17/2022   Infrarenal abdominal aortic aneurysm (AAA) without rupture (HCC) 05/17/2022   Respiratory failure, chronic (HCC) 05/17/2022   Stable angina (HCC) 05/17/2022   Acute hypoxemic respiratory failure (HCC) 05/02/2022   Erythrocytosis 05/02/2022   Obesity, Class III, BMI 40-49.9 (morbid obesity) (HCC) 05/02/2022   CKD (chronic kidney disease), stage III (HCC) 05/02/2022   OSA (obstructive sleep apnea)    COPD exacerbation (HCC) 09/02/2019   Essential hypertension 09/02/2019   HLD (hyperlipidemia) 09/02/2019   Past Medical History:  Diagnosis Date   COPD (chronic obstructive pulmonary disease) (HCC)    High cholesterol    Hypertension    Smoker    Past Surgical History:  Procedure Laterality Date   SALPINGECTOMY N/A    Social History   Tobacco Use   Smoking status: Every Day    Current packs/day: 1.00    Types: Cigarettes   Smokeless tobacco: Never  Substance Use Topics   Alcohol use: Not Currently   Drug use: Not Currently   Family Status  Relation Name Status   Mother  (Not Specified)  No partnership data on file   Family History  Problem Relation Age of Onset   CAD Mother    No Known Allergies  ROS   ROS negative except for what is noted on the assessment and plan.   Objective:     BP  127/71 (BP Location: Right Wrist, Cuff Size: Small)   Pulse 83   Temp (!) 97.5 F (36.4 C) (Oral)   Ht 5\' 5"  (1.651 m)   Wt 184 lb 8 oz (83.7 kg)   SpO2 99%   BMI 30.70 kg/m  BP Readings from Last 3 Encounters:  10/18/23 127/71  10/11/23 113/84  09/07/23 113/73   Wt Readings from Last 3 Encounters:  10/18/23 184 lb 8 oz (83.7 kg)  10/11/23 186 lb 12.8 oz (84.7 kg)  09/07/23 198 lb (89.8 kg)     Physical Exam General: Appears ill, hunched over sitting in chair, slow to respond to questions, Cardiovascular: Distant heart sounds, no murmurs. Radial pulses intact.  Pulmonary: CTA bilaterally, no wheezing or crackles Abd: soft, nondistended, non tender, bowel sounds are present Neuro: Appears somewhat drowsy, no focal deficits Psych: somber mood and affect  No results found for any visits on 10/18/23.  Last CBC Lab Results  Component Value Date   WBC 10.6 (H) 10/11/2023   HGB 18.0 (H) 10/11/2023   HCT 54.2 (H) 10/11/2023   MCV 99.8 10/11/2023   MCH 33.1 10/11/2023   RDW 17.8 (H) 10/11/2023   PLT 210 10/11/2023   Last metabolic panel Lab Results  Component Value Date   GLUCOSE 99 10/11/2023   NA 133 (L) 10/11/2023   K 3.9 10/11/2023   CL 94 (L) 10/11/2023   CO2 29 10/11/2023  BUN 10 10/11/2023   CREATININE 1.18 (H) 10/11/2023   GFRNONAA 52 (L) 10/11/2023   CALCIUM 9.5 10/11/2023   PROT 7.3 10/11/2023   ALBUMIN 3.1 (L) 10/11/2023   LABGLOB 3.5 09/07/2023   BILITOT 1.3 (H) 10/11/2023   ALKPHOS 106 10/11/2023   AST 19 10/11/2023   ALT 10 10/11/2023   ANIONGAP 10 10/11/2023   Last lipids Lab Results  Component Value Date   CHOL 176 05/03/2022   HDL 34 (L) 05/03/2022   LDLCALC 125 (H) 05/03/2022   TRIG 87 05/03/2022   CHOLHDL 5.2 05/03/2022   Last hemoglobin A1c Lab Results  Component Value Date   HGBA1C 5.2 10/11/2023      The 10-year ASCVD risk score (Arnett DK, et al., 2019) is: 10.1%    Assessment & Plan:   Problem List Items Addressed This  Visit       Other   Encounter for screening involving social determinants of health (SDoH)   DME referral for Electric Wheelchair   Reports she is off balance, reports she can not walk that far, uses cane. Reports she has difficulty doing chorus around the huose, like washing dishes and vaccuming. Reports she has tripped and fell. States that she has numbness and pain of her bilateral lower extremity, reports has been going on for the past 6 months to a year.  Describes it as a sharp pain when there is pressure applied to her bilateral lower extremity.  Reports that she been feeling weaker lately feels like her lower legs will give out on her.       Intractable nausea - Primary   Patient was seen on 12/11 for concerns of intractable nausea, vomiting and weight loss estimated at 74 since last year, currently patient weighs 184 lbs, she weighed 186 pounds on 12/11, she has lost about 2 pounds since the last visit.Marland Kitchen STAT labs during this visit [12/11] unremarkable with exemption of mild hyponatremia NA 133 and mildly elevated Cr 1.18. She was given an option of being admitted to the hospital vs. returning home. Patient chose to go home and treat symptoms. She is currently on Zofran 8 mg BID OR Zofran 4 mg q8. She was started on mirtazapine 15 mg nightly for appetite. STAT CT abdomen ordered, however patient did not follow up. Patient has a follow up appointment with gastroenterology in January.  Per presentation today, patient reports no changes in her symptoms since last week. Patient states she has not eaten for the past 3 days, she is only able to tolerate liquids. Reports drinking milk. She si taking the mitazapine in the morning, reporting episodes of drowsiness and double vision.  States that she had blurry vision prior to starting mirtazapine.  Patient is reporting episodes of lightheadedness when she stands occasionally.  Denies any hematochezia, melena.  Denies any abdominal pain, vomiting.   Reports she had an episode of subjective fever last night.  We had an extensive conversation with this patient, she agrees to be admitted to the hospital for further testing.      Rapid weight loss   Patient was 258 pounds noted on 10/06/2022 She was 198 pounds noted on 09/07/2023 186 pounds on 10/11/2023 Today 184 pounds on 10/18/2023      Chest pain   Patient reports 2 episodes of chest pain since last week, reports that she had an episode 2 nights ago where she had pressure-like pain midsternal no radiation, no weakness of b/l UE.  During this episode, she also  reported shortness of breath.  Currently denies any chest pain or shortness of breath.  She is currently afebrile, saturating above 98% on room air.  Patient needs to be admitted to the hospital for further workup, she would need to get troponin and  EKG. I am concerned about electrolyte abnormalities with her decrease po intake. We must also r/o ACS and PE.        No follow-ups on file.    Jeral Pinch, DO

## 2023-10-18 NOTE — Assessment & Plan Note (Signed)
Patient was 258 pounds noted on 10/06/2022 She was 198 pounds noted on 09/07/2023 186 pounds on 10/11/2023 Today 184 pounds on 10/18/2023

## 2023-10-18 NOTE — Assessment & Plan Note (Signed)
Patient reports 2 episodes of chest pain since last week, reports that she had an episode 2 nights ago where she had pressure-like pain midsternal no radiation, no weakness of b/l UE.  During this episode, she also reported shortness of breath.  Currently denies any chest pain or shortness of breath.  She is currently afebrile, saturating above 98% on room air.  Patient needs to be admitted to the hospital for further workup, she would need to get troponin and  EKG. I am concerned about electrolyte abnormalities with her decrease po intake. We must also r/o ACS and PE.

## 2023-10-18 NOTE — Assessment & Plan Note (Signed)
DME referral for Electric Wheelchair   Reports she is off balance, reports she can not walk that far, uses cane. Reports she has difficulty doing chorus around the huose, like washing dishes and vaccuming. Reports she has tripped and fell. States that she has numbness and pain of her bilateral lower extremity, reports has been going on for the past 6 months to a year.  Describes it as a sharp pain when there is pressure applied to her bilateral lower extremity.  Reports that she been feeling weaker lately feels like her lower legs will give out on her.

## 2023-10-18 NOTE — Assessment & Plan Note (Signed)
Patient was seen on 12/11 for concerns of intractable nausea, vomiting and weight loss estimated at 74 since last year, currently patient weighs 184 lbs, she weighed 186 pounds on 12/11, she has lost about 2 pounds since the last visit.Marland Kitchen STAT labs during this visit [12/11] unremarkable with exemption of mild hyponatremia NA 133 and mildly elevated Cr 1.18. She was given an option of being admitted to the hospital vs. returning home. Patient chose to go home and treat symptoms. She is currently on Zofran 8 mg BID OR Zofran 4 mg q8. She was started on mirtazapine 15 mg nightly for appetite. STAT CT abdomen ordered, however patient did not follow up. Patient has a follow up appointment with gastroenterology in January.  Per presentation today, patient reports no changes in her symptoms since last week. Patient states she has not eaten for the past 3 days, she is only able to tolerate liquids. Reports drinking milk. She si taking the mitazapine in the morning, reporting episodes of drowsiness and double vision.  States that she had blurry vision prior to starting mirtazapine.  Patient is reporting episodes of lightheadedness when she stands occasionally.  Denies any hematochezia, melena.  Denies any abdominal pain, vomiting.  Reports she had an episode of subjective fever last night.  We had an extensive conversation with this patient, she agrees to be admitted to the hospital for further testing.

## 2023-10-18 NOTE — H&P (Signed)
Date: 10/18/2023               Patient Name:  Kim [name] MRN: [id number]  DOB: 21-Dec-1960 Age / Sex: 62 y.o., female   PCP: Modena Slater, DO         Medical Service: Internal Medicine Teaching Service         Attending Physician: Dr. Reymundo Poll, MD      First Contact: Dr. Faith Rogue, DO Pager [phone]    Second Contact: Dr. Marrianne Mood, MD Pager [phone]         After Hours (After 5p/  First Contact Pager: [phone]  weekends / holidays): Second Contact Pager: [phone]   SUBJECTIVE   Chief Complaint: Unintentional Weight loss   History of Present Illness: Kim [name] is a 62 year old with a past medical history of COPD, tobacco use, HTN, infrarenal AAA she was evaluated at the [facility] this morning for unintentional weight loss, nausea, and vomiting who was a direct admit to the clinic for further evaluation.  Patient reported that she is experienced a 50 pound weight loss over the past few months.  She stated that she has trouble keeping food down after she eats.  She stated that she notices solid foods making her feel sick however liquids do not make her feel sick.  She reports nausea and vomiting after eating solid foods.  She also reports feeling weak and tired.  She has tried Remeron which did not help with appetite.  Patient reports changes in bowel habits including constipation periodic diarrhea.  Patient is not routinely undergo cancer screening including: breast cancer screening, colon cancer screening, lung cancer screening.   Patient denies dysphagia, fever, night sweats, sensation of food bolus being stuck in esophagus, hematochezia, melena, hematemesis, hematuria, shortness of breath, recent infections, jaw claudication with eating, recent travel, new pets, hemoptysis.  Of note, patient reports chronic chest pain that also started with the unintentional weight loss.  She stated that it is on exertional and comes and goes.  She denies chest pain while  eating, she denies heartburn symptoms.  Review of Systems: A complete ROS was negative except as per HPI.   Past Medical History: HTN  COPD, 3L [place] at night  HLD  Neuropahty  Infrarenal AAA  Medications:  Albuterol Inhaler prn  Amlodipine 10 mg (not filling it)  Gabapentin 300 mg, d/c due to double vision  Mirtazapine Not taking  Protonix not taking  Pravastatin 20 mg (not filling)  Zofran 4mg   Unsure which inhalers: incruse ellipta, spiriva   Past Surgical History:  Procedure Laterality Date   SALPINGECTOMY N/A     Social:  Lives With: Home with daughter and partner  Occupation: Not currently working  Support:Daughter and Partner  Level of Function: dependent ADLs and iADLs , uses cane PCP: Dr. Allena Katz [facility] Substances: Smokes 1/2-1 pack per day, 50 pack years Alcohol: None  Illicit substances: None   Family History:  Father- Brain cancer  Sister- Breast cancer, still alive   Allergies: Allergies as of 10/18/2023   (No Known Allergies)    OBJECTIVE:   Physical Exam: Blood pressure 107/77, pulse 80, temperature 97.9 F (36.6 C), temperature source Oral, resp. rate 18, SpO2 94%.  Constitutional: ill-appearing, sitting in bed, in no acute distress HENT: normocephalic atraumatic, no temporal wasting, poor dentition, dry mucous membranes   Cardiovascular: regular rate and rhythm, no m/r/g Pulmonary/Chest: normal work of breathing on room air, bibasilar expiratory wheezes present No crackles  Abdominal: soft, non-distended, LUQ tenderness to palpation   Neurological: alert & oriented x 3 MSK: no gross abnormalities. No pitting edema Skin: warm and dry, does not appear jaundiced  Psych: Normal mood and flat affect  Labs:    Latest Ref Rng & Units 10/11/2023    2:26 PM 09/07/2023    4:27 PM 10/06/2022    5:07 PM  CBC  WBC 4.0 - 10.5 K/uL 10.6  11.3    Hemoglobin 12.0 - 15.0 g/dL 16.1  09.6  04.5   Hematocrit 36.0 - 46.0 % 54.2  56.2  53.0   Platelets 150 -  400 K/uL 210  237            Latest Ref Rng & Units 10/11/2023    2:26 PM 09/07/2023    4:27 PM 10/06/2022    5:07 PM  CMP  Glucose 70 - 99 mg/dL 99  88    BUN 8 - 23 mg/dL 10  10    Creatinine 4.09 - 1.00 mg/dL 8.11  9.14    Sodium 782 - 145 mmol/L 133  137  136   Potassium 3.5 - 5.1 mmol/L 3.9  3.5  3.7   Chloride 98 - 111 mmol/L 94  95    CO2 22 - 32 mmol/L 29  24    Calcium 8.9 - 10.3 mg/dL 9.5  9.5    Total Protein 6.5 - 8.1 g/dL 7.3  7.2    Total Bilirubin <1.2 mg/dL 1.3  0.7    Alkaline Phos 38 - 126 U/L 106  139    AST 15 - 41 U/L 19  16    ALT 0 - 44 U/L 10  7        Imaging: No results found.    EKG: pending   ASSESSMENT & PLAN:   Assessment & Plan by Problem: Principal Problem:   Weight loss, unintentional Active Problems:   Essential hypertension   HLD (hyperlipidemia)   CKD (chronic kidney disease), stage III (HCC)   Intractable nausea   Rapid weight loss, unintentional   Pressure injury of skin   Kim [name] is a 62 y.o. person living with a history of COPD, tobacco use, HTN who presented with unintentional weight loss with nausea and vomiting and admitted for unintentional weight loss  on hospital day 0  #Unintentional weight loss #Nausea, vomiting Patient presents with a progressive weight loss that is associated with anorexia, nausea, vomiting, and changes to bowel habits (new constipation). Patient does not undergo routine cancer screening and has numerous risk factors for cancers including lung cancer with a 50 year pack history, RCC, and breast cancer, 1st degree relative. Non-cancerous causes will also be evaluated including HIV, PUD, esophageal diseases, adrenal insufficiency and gastrointestinal causes.  Plan: -CBC in AM with HFP and RFP -HIV Hepatitis Panel in morning  -CT chest, abdomen, pelvis with contrast -Zofran 4mg  prn -Trial of PO liquid intake  -Regular diet  -U/A, evaluate for ketones and hematuria  -TSH one month ago was  4.480, repeat in morning  -Serum osmolality and ACTH stimulation testing  -Will consider GI Consult in the morning  -Patient is at risk of refeeding syndrome, will monitor phosphorus   #COPD #OSA #Tobacco Use Patient uses supplemental oxygen at night.  -Nicotine patches 14 mg  -Patient is unsure of home inhalers, appears to fill albuterol and incruse ellipta  -Restart home medications   #HTN Reported taking amlodipine 10mg , appears she has not filled this since  February -BP is 107/77, will hold on restarting amlodipine   #Infrarenal abdominal aortic aneurysm  #HLD Patient reports taking pravastatin 20 mg for HLD, dispense history does not correlate -Will start tomorrow  -Will follow in CT tomorrow   #CKD stage III Scr baseline appears to be 1.10-1.20 -Scr today is stable at 1.18, will monitor   #Pressure Ulcer, POA -WOC consulted    Diet: Normal VTE: Enoxaparin Code: Full  Prior to Admission Living Arrangement: Home, living partner and daughter Anticipated Discharge Location: Home Barriers to Discharge: Medical Treatment   Dispo: Admit patient to Observation with expected length of stay less than 2 midnights.  Signed:   Faith Rogue, DO Internal Medicine Resident PGY-1 10/18/2023, 7:11 PM   Please contact the on call pager after 5 pm and on weekends at [phone].

## 2023-10-19 ENCOUNTER — Ambulatory Visit (HOSPITAL_COMMUNITY): Payer: Medicaid Other

## 2023-10-19 ENCOUNTER — Encounter (HOSPITAL_COMMUNITY): Payer: Self-pay | Admitting: Internal Medicine

## 2023-10-19 ENCOUNTER — Other Ambulatory Visit: Payer: Self-pay

## 2023-10-19 DIAGNOSIS — Z9981 Dependence on supplemental oxygen: Secondary | ICD-10-CM | POA: Diagnosis not present

## 2023-10-19 DIAGNOSIS — R6881 Early satiety: Secondary | ICD-10-CM | POA: Diagnosis not present

## 2023-10-19 DIAGNOSIS — K259 Gastric ulcer, unspecified as acute or chronic, without hemorrhage or perforation: Secondary | ICD-10-CM | POA: Diagnosis not present

## 2023-10-19 DIAGNOSIS — J9610 Chronic respiratory failure, unspecified whether with hypoxia or hypercapnia: Secondary | ICD-10-CM

## 2023-10-19 DIAGNOSIS — R935 Abnormal findings on diagnostic imaging of other abdominal regions, including retroperitoneum: Secondary | ICD-10-CM

## 2023-10-19 DIAGNOSIS — R634 Abnormal weight loss: Secondary | ICD-10-CM

## 2023-10-19 DIAGNOSIS — R112 Nausea with vomiting, unspecified: Secondary | ICD-10-CM

## 2023-10-19 DIAGNOSIS — K2950 Unspecified chronic gastritis without bleeding: Secondary | ICD-10-CM | POA: Diagnosis not present

## 2023-10-19 DIAGNOSIS — E440 Moderate protein-calorie malnutrition: Secondary | ICD-10-CM | POA: Insufficient documentation

## 2023-10-19 DIAGNOSIS — R7989 Other specified abnormal findings of blood chemistry: Secondary | ICD-10-CM | POA: Diagnosis not present

## 2023-10-19 DIAGNOSIS — B9681 Helicobacter pylori [H. pylori] as the cause of diseases classified elsewhere: Secondary | ICD-10-CM | POA: Diagnosis not present

## 2023-10-19 LAB — ACTH STIMULATION, 3 TIME POINTS
Cortisol, 30 Min: 20.9 ug/dL
Cortisol, 60 Min: 22.5 ug/dL
Cortisol, Base: 14.5 ug/dL

## 2023-10-19 LAB — CBC WITH DIFFERENTIAL/PLATELET
Abs Immature Granulocytes: 0.1 10*3/uL — ABNORMAL HIGH (ref 0.00–0.07)
Basophils Absolute: 0.1 10*3/uL (ref 0.0–0.1)
Basophils Relative: 1 %
Eosinophils Absolute: 0.1 10*3/uL (ref 0.0–0.5)
Eosinophils Relative: 1 %
HCT: 48.5 % — ABNORMAL HIGH (ref 36.0–46.0)
Hemoglobin: 16.4 g/dL — ABNORMAL HIGH (ref 12.0–15.0)
Immature Granulocytes: 1 %
Lymphocytes Relative: 27 %
Lymphs Abs: 3.2 10*3/uL (ref 0.7–4.0)
MCH: 33.2 pg (ref 26.0–34.0)
MCHC: 33.8 g/dL (ref 30.0–36.0)
MCV: 98.2 fL (ref 80.0–100.0)
Monocytes Absolute: 0.8 10*3/uL (ref 0.1–1.0)
Monocytes Relative: 7 %
Neutro Abs: 7.4 10*3/uL (ref 1.7–7.7)
Neutrophils Relative %: 63 %
Platelets: 223 10*3/uL (ref 150–400)
RBC: 4.94 MIL/uL (ref 3.87–5.11)
RDW: 17.1 % — ABNORMAL HIGH (ref 11.5–15.5)
WBC: 11.8 10*3/uL — ABNORMAL HIGH (ref 4.0–10.5)
nRBC: 0 % (ref 0.0–0.2)

## 2023-10-19 LAB — HEPATITIS PANEL, ACUTE
HCV Ab: NONREACTIVE
Hep A IgM: NONREACTIVE
Hep B C IgM: NONREACTIVE
Hepatitis B Surface Ag: NONREACTIVE

## 2023-10-19 LAB — URINALYSIS, ROUTINE W REFLEX MICROSCOPIC
Bilirubin Urine: NEGATIVE
Glucose, UA: NEGATIVE mg/dL
Hgb urine dipstick: NEGATIVE
Ketones, ur: NEGATIVE mg/dL
Leukocytes,Ua: NEGATIVE
Nitrite: NEGATIVE
Protein, ur: NEGATIVE mg/dL
Specific Gravity, Urine: 1.026 (ref 1.005–1.030)
pH: 6 (ref 5.0–8.0)

## 2023-10-19 LAB — RENAL FUNCTION PANEL
Albumin: 2.7 g/dL — ABNORMAL LOW (ref 3.5–5.0)
Anion gap: 8 (ref 5–15)
BUN: 10 mg/dL (ref 8–23)
CO2: 29 mmol/L (ref 22–32)
Calcium: 8.8 mg/dL — ABNORMAL LOW (ref 8.9–10.3)
Chloride: 95 mmol/L — ABNORMAL LOW (ref 98–111)
Creatinine, Ser: 1.13 mg/dL — ABNORMAL HIGH (ref 0.44–1.00)
GFR, Estimated: 55 mL/min — ABNORMAL LOW (ref >60)
Glucose, Bld: 82 mg/dL (ref 70–99)
Phosphorus: 3.3 mg/dL (ref 2.5–4.6)
Potassium: 3.1 mmol/L — ABNORMAL LOW (ref 3.5–5.1)
Sodium: 132 mmol/L — ABNORMAL LOW (ref 135–145)

## 2023-10-19 LAB — TSH: TSH: 5.762 u[IU]/mL — ABNORMAL HIGH (ref 0.350–4.500)

## 2023-10-19 LAB — VITAMIN B12: Vitamin B-12: 434 pg/mL (ref 180–914)

## 2023-10-19 LAB — T4, FREE: Free T4: 0.91 ng/dL (ref 0.61–1.12)

## 2023-10-19 LAB — CORTISOL-AM, BLOOD: Cortisol - AM: 21.6 ug/dL (ref 6.7–22.6)

## 2023-10-19 MED ORDER — UMECLIDINIUM BROMIDE 62.5 MCG/ACT IN AEPB
1.0000 | INHALATION_SPRAY | Freq: Every day | RESPIRATORY_TRACT | Status: DC
  Filled 2023-10-19: qty 7

## 2023-10-19 MED ORDER — ACETAMINOPHEN 325 MG PO TABS
650.0000 mg | ORAL_TABLET | Freq: Four times a day (QID) | ORAL | Status: DC | PRN
  Administered 2023-10-19 (×2): 650 mg via ORAL
  Filled 2023-10-19 (×2): qty 2

## 2023-10-19 MED ORDER — POTASSIUM CHLORIDE CRYS ER 20 MEQ PO TBCR
40.0000 meq | EXTENDED_RELEASE_TABLET | Freq: Once | ORAL | Status: AC
  Administered 2023-10-19: 40 meq via ORAL
  Filled 2023-10-19: qty 2

## 2023-10-19 MED ORDER — BOOST / RESOURCE BREEZE PO LIQD CUSTOM
1.0000 | Freq: Three times a day (TID) | ORAL | Status: DC
  Administered 2023-10-19: 1 via ORAL

## 2023-10-19 NOTE — Plan of Care (Signed)

## 2023-10-19 NOTE — Progress Notes (Signed)
Initial Nutrition Assessment  DOCUMENTATION CODES:   Non-severe (moderate) malnutrition in context of acute illness/injury  INTERVENTION:   -Diet progression per physician. Goal diet dysphagia III for ease of chewing, due to ill-fitting dentures.  -Supplement meals for repletion once diet beyond clear liquids, provide Ensure Enlive po BID, each supplement provides 350 kcal and 20 grams of protein.  -Magic cup TID with meals, each supplement provides 290 kcal and 9 grams of protein.  -MVI/Minerals-1 Tab daily, folic acid 1 mg daily.    NUTRITION DIAGNOSIS:   Severe Malnutrition related to acute illness, nausea, vomiting (possibly chronic illness) as evidenced by percent weight loss, per patient/family report, mild muscle depletion, energy intake < 75% for > or equal to 3 months.    GOAL:   Patient will meet greater than or equal to 90% of their needs   MONITOR:   PO intake, Supplement acceptance, Skin, Diet advancement, Labs, Weight trends  REASON FOR ASSESSMENT:   Consult Assessment of nutrition requirement/status  ASSESSMENT: 62 y/o female evaluated for unintentional weight loss. Reported 50# unintentional weight loss over past few months. She has trouble keeping food down after meals. Nausea and vomiting postmeal, chronic chest pain. Also reports solid food causes her to feel sick, liquids are tolerated and was able to drink all liquids. Notes periodic diarrhea. PMH: COPD-3L at night, tobacco use, HTN, infrarenal AAA, HLD  Patient informs she has been consuming mainly liquids for the past two months with inability to keep solid down.  She was eating pudding and ice cream mainly, twice daily. Notes that recently at a party she consumed a large portion of fudge which caused diarrhea.  Currently complains of constipation.She is asking for sherbet and ice cream now. Edentulous, dentures available, notes they do not fit well. She is on a clear liquid diet, to be NPO at midnight  for gastric emptying study. Notes gradual increased weakness for the same time frame. Per review of EMR reveals weight loss of 8% within 1.5 months and 29% within 12.25 months. Patient denies purposely trying to lose weight.  Medications reviewed.  Labs: potassium 3.1, TSH 5,762, folate 2.6 (09/07/23)     NUTRITION - FOCUSED PHYSICAL EXAM:  Flowsheet Row Most Recent Value  Orbital Region No depletion  Upper Arm Region No depletion  Thoracic and Lumbar Region No depletion  Buccal Region No depletion  Temple Region Mild depletion  Clavicle Bone Region No depletion  Clavicle and Acromion Bone Region No depletion  Scapular Bone Region No depletion  Dorsal Hand No depletion  Patellar Region No depletion  Anterior Thigh Region No depletion  Posterior Calf Region Mild depletion  Edema (RD Assessment) None  Hair Reviewed  Eyes Reviewed  Mouth Reviewed  Skin Reviewed  Nails Reviewed       Diet Order:   Diet Order             Diet clear liquid Room service appropriate? No; Fluid consistency: Thin  Diet effective now                   EDUCATION NEEDS:   Not appropriate for education at this time  Skin:  Skin Assessment: Skin Integrity Issues: Skin Integrity Issues:: Stage I Stage I: bilateral buttock  Last BM:  10/17/23  Height:   Ht Readings from Last 1 Encounters:  10/18/23 5\' 5"  (1.651 m)    Weight:   Wt Readings from Last 1 Encounters:  10/19/23 82.3 kg    Ideal Body Weight:  59.1 kg  BMI:  Body mass index is 30.2 kg/m.  Estimated Nutritional Needs:   Kcal:  1800-2000 kcal/day  Protein:  71-89 gm/day  Fluid:  1800-2000 mL/day    Alvino Chapel, RDLD Clinical Dietitian If unable to reach, please contact "RD Inpatient" secure chat group between 8 am-4 pm daily"

## 2023-10-19 NOTE — Plan of Care (Signed)
A/ox4. No complaints of pain or nausea. Placed on 3L home oxygen. Up with standby assist with cane   Problem: Education: Goal: Knowledge of General Education information will improve Description: Including pain rating scale, medication(s)/side effects and non-pharmacologic comfort measures Outcome: Progressing   Problem: Health Behavior/Discharge Planning: Goal: Ability to manage health-related needs will improve Outcome: Progressing   Problem: Clinical Measurements: Goal: Ability to maintain clinical measurements within normal limits will improve Outcome: Progressing Goal: Will remain free from infection Outcome: Progressing Goal: Diagnostic test results will improve Outcome: Progressing Goal: Respiratory complications will improve Outcome: Progressing Goal: Cardiovascular complication will be avoided Outcome: Progressing   Problem: Activity: Goal: Risk for activity intolerance will decrease Outcome: Progressing   Problem: Nutrition: Goal: Adequate nutrition will be maintained Outcome: Progressing   Problem: Coping: Goal: Level of anxiety will decrease Outcome: Progressing   Problem: Elimination: Goal: Will not experience complications related to bowel motility Outcome: Progressing Goal: Will not experience complications related to urinary retention Outcome: Progressing   Problem: Pain Management: Goal: General experience of comfort will improve Outcome: Progressing   Problem: Safety: Goal: Ability to remain free from injury will improve Outcome: Progressing   Problem: Skin Integrity: Goal: Risk for impaired skin integrity will decrease Outcome: Progressing

## 2023-10-19 NOTE — Consult Note (Signed)
WOC Nurse Consult Note: Reason for Consult: Requested to assess pressure injury on buttlock stage 1 Wound type: MASD, no skin disruption, redness non blanchable  Measurement: 3X3cm, close to the anus, both sides  Dressing procedure/placement/frequency:  Apply barrier cream on the buttlock daily and after defecation.  WOC team will not plan to follow further.  Please reconsult if further assistance is needed. Thank-you,  Denyse Amass BSN, RN, [employer], WOC  (Pager: [phone])

## 2023-10-19 NOTE — Hospital Course (Addendum)
#  PUD #Acute ulcerative and erosive gastritis  #Unintentional weight loss #Nausea, vomiting Patient presents with a progressive weight loss that is associated with anorexia, nausea, vomiting, and changes to bowel habits (new constipation). Hepatis panel is negative, HIV is negative. CT imaging did not reveal pathology, we considered gastroparesis due to quick onset of nausea after eating but she did not want to proceed with the gastric emptying study. GI was consulted due to concerning symptoms of weight loss and intractable N/V. Endoscopy revealed acute ulcerative and erosive gastritis. She will need to follow up with GI in the office for colonoscopy. Patient needs to take a PPI BID for 8 weeks.    #Severe Malnutrition  Patient's severe malnutrition multifactorial due to acute ulcerative and erosive gastritis as well as chronic conditions of poor fitting dentures. Consider referral to nutritional services.   #Subclinical hypothyroidism TSH elevated at 5.762, she will need outpatient TSH monitoring    #COPD #OSA #Tobacco Use Patient used supplemental oxygen at night. Continued home regimen.   #HTN Reported taking amlodipine 10mg , this was held for hypotension.    #Infrarenal abdominal aortic aneurysm 3.5 cm  #HLD Continued home pravastatin 20 mg for HLD, recommended [name] every three years.   #CKD stage III Scr baseline appears to be 1.10-1.20, remained stable.

## 2023-10-19 NOTE — Evaluation (Signed)
Physical Therapy Evaluation Patient Details Name: Kim [name] MRN: [id number] DOB: [birth year] Today's Date: 10/19/2023  History of Present Illness  The pt is a 62 yo female presenting 12/18 with reports of 1 month of nausea, vomiting, decreased appetite, and 50 lb weight loss. CT showed  abdominal aortic aneurysm, still undergoing workup for cause of sx. PMH includes: COPD, OSA, hypertension, obesity, tobacco use, HTN, and infrarenal AAA.Marland Kitchen    Clinical Impression  Pt in bed upon arrival of PT, agreeable to evaluation at this time. Prior to admission the pt was ambulating short household distances with use of rollator, but reports largely sedentary, spending most of her time in bed for past 2 months due to decreased energy and strength. The pt was able to complete sit-stand transfer with CGA and complete short distance ambulation in the room with use of cane, CGA, and holding furniture/walls for stability. No overt buckling or LOB this session. Pt reports pain in bilateral feet (pins and needles) that is limiting factor for mobility in addition to fatigue. Is agreeable to continued PT with plan of regaining endurance and functional strength both with acute PT and after return home.   The pt is significantly limited in her endurance and will benefit from power scooter to allow for increased participation in community.          If plan is discharge home, recommend the following: A little help with walking and/or transfers;A little help with bathing/dressing/bathroom;Assist for transportation;Help with stairs or ramp for entrance   Can travel by private vehicle        Equipment Recommendations Other (comment) (electric scooter)  Recommendations for Other Services       Functional Status Assessment Patient has had a recent decline in their functional status and demonstrates the ability to make significant improvements in function in a reasonable and predictable amount of time.      Precautions / Restrictions Precautions Precautions: Fall Restrictions Weight Bearing Restrictions Per Provider Order: No      Mobility  Bed Mobility Overal bed mobility: Modified Independent                  Transfers Overall transfer level: Needs assistance Equipment used: Straight cane Transfers: Sit to/from Stand Sit to Stand: Supervision           General transfer comment: close supervision for safety, using SPC in this session    Ambulation/Gait Ambulation/Gait assistance: Contact guard assist Gait Distance (Feet): 35 Feet Assistive device: Straight cane (and furniture) Gait Pattern/deviations: Step-through pattern, Decreased stride length Gait velocity: decreased Gait velocity interpretation: <1.31 ft/sec, indicative of household ambulator   General Gait Details: pt with increased lateral movement, reaching with LUE to hold furniture/walls while holding straight cane in RUE. SpO2 to 87% after ambulaiton in the room, 92% after seated rest on RA., pt reports limited by pain in her feet      Balance Overall balance assessment: Mild deficits observed, not formally tested                                           Pertinent Vitals/Pain Pain Assessment Pain Assessment: 0-10 Pain Score: 7  Pain Location: BLEs Pain Descriptors / Indicators: Aching Pain Intervention(s): Limited activity within patient's tolerance, Premedicated before session, Monitored during session    Home Living Family/patient expects to be discharged to:: Private residence Living Arrangements: Spouse/significant other  Available Help at Discharge: Family;Available 24 hours/day Type of Home: House Home Access: Stairs to enter Entrance Stairs-Rails: Can reach both Entrance Stairs-Number of Steps: 5   Home Layout: One level Home Equipment: Rollator (4 wheels);Cane - single point;BSC/3in1 Additional Comments: Pt lives with boyfriend and daughter who can assist as  needed 24/7    Prior Function Prior Level of Function : Independent/Modified Independent             Mobility Comments: using rollator in the home and in community, limited to ~30 ft walking, spends majority of time in bed ADLs Comments: needs assistance, scared to get in shower due to concern for Fear of falling     Extremity/Trunk Assessment   Upper Extremity Assessment Upper Extremity Assessment: Defer to OT evaluation RUE Deficits / Details: Pt reports occasional numbness in all fingertips RUE Coordination: WNL LUE Deficits / Details: Pt reports occasional numbness in all fingertips LUE Coordination: WNL    Lower Extremity Assessment Lower Extremity Assessment: Generalized weakness    Cervical / Trunk Assessment Cervical / Trunk Assessment: Kyphotic;Other exceptions Cervical / Trunk Exceptions: large body habitus  Communication   Communication Communication: No apparent difficulties  Cognition Arousal: Alert Behavior During Therapy: WFL for tasks assessed/performed Overall Cognitive Status: Within Functional Limits for tasks assessed                                          General Comments General comments (skin integrity, edema, etc.): SpO2 92% on RA at rest, low of 87% after mobility.    Exercises General Exercises - Lower Extremity Long Arc Quad: AROM, Both, 10 reps, Seated Heel Slides: AROM, Both, 10 reps, Seated Hip Flexion/Marching: AROM, Both, 10 reps, Seated   Assessment/Plan    PT Assessment Patient needs continued PT services  PT Problem List Decreased strength;Decreased range of motion;Decreased activity tolerance;Decreased balance;Decreased mobility;Pain       PT Treatment Interventions Gait training;DME instruction;Functional mobility training;Stair training;Therapeutic exercise;Therapeutic activities;Balance training    PT Goals (Current goals can be found in the Care Plan section)  Acute Rehab PT Goals Patient Stated  Goal: to improve endurance to allow for mobility in the home PT Goal Formulation: With patient Time For Goal Achievement: 11/02/23 Potential to Achieve Goals: Fair    Frequency Min 1X/week        AM-PAC PT "6 Clicks" Mobility  Outcome Measure Help needed turning from your back to your side while in a flat bed without using bedrails?: None Help needed moving from lying on your back to sitting on the side of a flat bed without using bedrails?: None Help needed moving to and from a bed to a chair (including a wheelchair)?: A Little Help needed standing up from a chair using your arms (e.g., wheelchair or bedside chair)?: A Little Help needed to walk in hospital room?: A Little Help needed climbing 3-5 steps with a railing? : A Lot 6 Click Score: 19    End of Session Equipment Utilized During Treatment: Gait belt Activity Tolerance: Patient limited by pain Patient left: in bed;with call bell/phone within reach Nurse Communication: Mobility status PT Visit Diagnosis: Unsteadiness on feet (R26.81);Other abnormalities of gait and mobility (R26.89);Muscle weakness (generalized) (M62.81);Pain Pain - part of body: Ankle and joints of foot    Time: 8295-6213 PT Time Calculation (min) (ACUTE ONLY): 33 min   Charges:   PT Evaluation $PT  Eval Moderate Complexity: 1 Mod PT Treatments $Therapeutic Activity: 8-22 mins PT General Charges $$ ACUTE PT VISIT: 1 Visit         Vickki Muff, PT, DPT   Acute Rehabilitation Department Office [phone] Secure Chat Communication Preferred   Ronnie Derby 10/19/2023, 3:43 PM

## 2023-10-19 NOTE — Progress Notes (Addendum)
Subjective: Kim [name] is a 62 year old with a past medical history of COPD, tobacco use, HTN, infrarenal AAA she was evaluated at the [facility] for unintentional weight loss, nausea, and vomiting who was a direct admit to the clinic for further evaluation on 10/19/2023.  Patient reports feeling ok. She denies nausea, vomiting, or abdominal pain this morning. She stated that within 30 minutes after eating food (mainly solids) she will feel nauseated. She denies attempting to eat solid food overnight.   Objective:  Vital signs in last 24 hours: Vitals:   10/19/23 0519 10/19/23 0525 10/19/23 0526 10/19/23 0754  BP: 131/89 (!) 89/64  (!) 112/52  Pulse: 96 83  65  Resp: 18 18  18   Temp: 97.8 F (36.6 C) (!) 97.5 F (36.4 C)  98 F (36.7 C)  TempSrc: Oral Oral    SpO2: (!) 89% (!) 86% 93% 99%  Weight:       Physical Exam: General:NAD, chronically ill appearing  Cardiac:regular rate and rhythm  Pulmonary: expiratory wheezes bilaterally  Abdominal: soft, non tender, bowel sounds present  Neuro:alert, awake, moving all four extremities  Skin:warm and dry  Psych: flat affect      Latest Ref Rng & Units 10/19/2023    4:51 AM 10/11/2023    2:26 PM 09/07/2023    4:27 PM  CBC  WBC 4.0 - 10.5 K/uL 11.8  10.6  11.3   Hemoglobin 12.0 - 15.0 g/dL 86.5  78.4  69.6   Hematocrit 36.0 - 46.0 % 48.5  54.2  56.2   Platelets 150 - 400 K/uL 223  210  237        Latest Ref Rng & Units 10/19/2023    4:51 AM 10/11/2023    2:26 PM 09/07/2023    4:27 PM  CMP  Glucose 70 - 99 mg/dL 82  99  88   BUN 8 - 23 mg/dL 10  10  10    Creatinine 0.44 - 1.00 mg/dL 2.95  2.84  1.32   Sodium 135 - 145 mmol/L 132  133  137   Potassium 3.5 - 5.1 mmol/L 3.1  3.9  3.5   Chloride 98 - 111 mmol/L 95  94  95   CO2 22 - 32 mmol/L 29  29  24    Calcium 8.9 - 10.3 mg/dL 8.8  9.5  9.5   Total Protein 6.5 - 8.1 g/dL  7.3  7.2   Total Bilirubin <1.2 mg/dL  1.3  0.7   Alkaline Phos 38 - 126 U/L  106  139   AST 15 - 41  U/L  19  16   ALT 0 - 44 U/L  10  7     CT:  1.No acute intrathoracic or intra-abdominal pathology identified. No definite radiographic explanation for the patient's reported symptoms. 2. Extensive multi-vessel coronary artery calcification. 3. Severe sigmoid diverticulosis without superimposed acute inflammatory change. 4. 3.5 cm infrarenal abdominal aortic aneurysm. Recommend follow-up ultrasound every 3 years.  Assessment/Plan: Resolved Problems:  __________________________________  Principal Problem:   Weight loss, unintentional Active Problems:   Essential hypertension   HLD (hyperlipidemia)   CKD (chronic kidney disease), stage III (HCC)   Intractable nausea   Rapid weight loss, unintentional   Pressure injury of skin  #Unintentional weight loss #Nausea, vomiting Patient presents with a progressive weight loss that is associated with anorexia, nausea, vomiting, and changes to bowel habits (new constipation). Hepatis panel is negative, HIV is negative. CT imaging did  not reveal pathology, will consider gastroparesis due to quick onset of nausea after eating. GI is consulted due to concerning symptoms of weight loss and intractable N/V.  Plan: -Zofran 4mg  prn -Trial of PO intake  -Regular diet  -U/A, evaluate for ketones and hematuria  -GI Consulted -Patient is at risk of refeeding syndrome, will monitor phosphorus  -gastric emptying study ordered, will consider Reglan after study  -Follow up b12  #Subclinical hypothyroidism Will discuss benefits vs risks of starting synthroid prior to discharge due to sx of fatigue/weakness.    #COPD #OSA #Tobacco Use Patient uses supplemental oxygen at night. Expiratory wheezes present  -Nicotine patches 14 mg  -Patient is unsure of home inhalers, appears to fill albuterol and incruse ellipta    #HTN Reported taking amlodipine 10mg , appears she has not filled this since February -BP is 112/52, will hold on restarting  amlodipine    #Infrarenal abdominal aortic aneurysm  #HLD Patient reports taking pravastatin 20 mg for HLD, dispense history does not correlate -Continue home pravastatin     #CKD stage III Scr baseline appears to be 1.10-1.20 -Scr today is stable at 1.13, will monitor   Prior to Admission Living Arrangement:home  Anticipated Discharge Location:home  Barriers to Discharge:medical treatment  Dispo: Anticipated discharge in approximately 2 day(s).   Faith Rogue, DO 10/19/2023, 11:39 AM After 5pm on weekdays and 1pm on weekends: On Call pager [phone]

## 2023-10-19 NOTE — H&P (View-Only) (Signed)
 Referring Provider: Reymundo Poll, MD Primary Care Physician:  Modena Slater, DO Primary Gastroenterologist: Gentry Fitz  Reason for Consultation: Nausea and vomiting, weight loss  HPI: Kim [name] is a 62 y.o. female with a past medical history of obesity, hypertension, hyperlipidemia, infrarenal AAA, chronic tobacco use, chronic respiratory failure on home oxygen 3 L [place], COPD, OSA, CKD stage III and polyneuropathy.   She was seen by her PCP 12/11//2024 for follow-up regarding intractable nausea, vomiting and weight loss.  Labs done at that time showed a WBC count of 10.6.  Hemoglobin 18.  Hemoglobin A1c 5.2. Sodium 133.  Potassium 3.9.  BUN 10.  Creatinine 1.18.  Albumin 3.1.  Total bili 1.3.  Alk phos 106.  AST 19.  ALT 10.  Her PCP recommended hospital admission, however, the patient declined.  A stat CTAP was ordered but was not completed. She was started on Mirtazapine 15 mg nightly to stimulate her appetite. A GI referral was provided and she was scheduled for new patient office visit in our outpatient GI clinic 11/14/2023. She was seen by her PCP for follow-up 12/18 and at that time she reported not eating any solid food x 3 days. She lost 74 pounds over the past year per PCP records. She also endorsed having intermittent chest pain for a few days. She was directly admitted to [facility] for further evaluation.  Labs at 4 AM today showed a WBC count of 11.8.  Hemoglobin 16.4.  Platelets 223.  Sodium 132.  Potassium 3.1.  Glucose 82.  BUN 10.  Creatinine 1.13.  Calcium 8.8.  Albumin 2.7.  GFR 55.  Acute hepatitis panel negative.  TSH 5.762.  AM cortisol level 21.6.  ACTH base 14.5, cortisol at 30 minutes 20.9 and cortisol at 60 minutes 22.5.  HIV nonreactive.  Urinalysis ordered, not yet completed.  Chest/abdominal/pelvic CT with contrast without acute intrathoracic or intraabdominal/pelvic pathology to explain her symptoms, showed extensive multivessel coronary artery calcification,  a 3.5 cm infrarenal abdominal aortic aneurysm and severe sigmoid diverticulosis without evidence of diverticulitis.  A GI consult was requested for further evaluation regarding intractable N/V and weight loss.  She describes having a decreased appetite for the past 2 months with nausea and vomiting once or twice weekly.  She describes emesis as partially digested food.  No coffee-ground or hematemesis.  No upper or lower abdominal pain.  She gets full easily therefore she "nibbles" on food throughout the day and does not eat a full meal.  She tolerates ice cream and pudding and tends to vomit vegetables or meat.  No heartburn.  She sometimes has difficulty swallowing large pills otherwise no dysphagia with solid foods or liquids.  Her bowel pattern varies, often has constipation.  She recently went 3 days without passing a bowel movement then used a suppository 1 or 2 days ago which resulted in passing a brown ball of stool.  No rectal bleeding or black stools.  No diarrhea.  She denies ever having an EGD or colonoscopy.  No known family history of esophageal, gastric, colon, liver or pancreatic cancer.  Father had brain cancer.  She smokes 1/2 to 1 pack of cigarettes daily for the past 50 years.  No alcohol or drug use.  No NSAID use.  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  She lives with her partner Alesia Richards.  Her daughter lives nearby.   Past Medical History:  Diagnosis Date   COPD (chronic obstructive pulmonary disease) (HCC)    High cholesterol    Hypertension    Smoker     Past Surgical History:  Procedure Laterality Date   SALPINGECTOMY N/A     Prior to Admission medications   Medication Sig Start Date End Date Taking? Authorizing Provider  acetaminophen (TYLENOL) 500 MG tablet Take 500 mg by mouth 3 (three) times daily as needed (pain).   Yes [provider]  albuterol (VENTOLIN  HFA) 108 (90 Base) MCG/ACT inhaler Inhale 2 puffs into the lungs every 4 (four) hours as needed for wheezing or shortness of breath. 10/07/22  Yes Champ Mungo, DO  gabapentin (NEURONTIN) 300 MG capsule Take 1 capsule (300 mg total) by mouth 3 (three) times daily. Patient taking differently: Take 300 mg by mouth daily as needed (Feet pain). 10/11/23 11/10/23 Yes Annett Fabian, MD  pravastatin (PRAVACHOL) 20 MG tablet TAKE 1 TABLET BY MOUTH EVERYDAY AT BEDTIME 12/13/22  Yes Patel, Amar, DO  senna-docusate (SENOKOT-S) 8.6-50 MG tablet Take 1 tablet by mouth at bedtime as needed for mild constipation. 05/09/22  Yes Morene Crocker, MD  triamcinolone (KENALOG) 0.025 % ointment Apply 1 Application topically 2 (two) times daily as needed (Itching). 06/17/22  Yes Champ Mungo, DO  umeclidinium bromide (INCRUSE ELLIPTA) 62.5 MCG/ACT AEPB Inhale 1 puff into the lungs daily. 03/07/23 03/06/24 Yes Doran Stabler, DO  amLODipine (NORVASC) 5 MG tablet Take 1 tablet (5 mg total) by mouth daily. 08/12/22 10/11/22  Modena Slater, DO  azithromycin (ZITHROMAX) 500 MG tablet Take 1 tablet (500 mg total) by mouth daily. Patient not taking: Reported on 10/18/2023 03/07/23   Doran Stabler, DO    Current Facility-Administered Medications  Medication Dose Route Frequency Provider Last Rate Last Admin   albuterol (PROVENTIL) (2.5 MG/3ML) 0.083% nebulizer solution 3 mL  3 mL Inhalation Q6H PRN Faith Rogue, DO       enoxaparin (LOVENOX) injection 40 mg  40 mg Subcutaneous Q24H Bender, Emily, DO   40 mg at 10/18/23 2052   nicotine (NICODERM CQ - dosed in mg/24 hours) patch 14 mg  14 mg Transdermal q1800 Faith Rogue, DO   14 mg at 10/18/23 2051   ondansetron (ZOFRAN-ODT) disintegrating tablet 4 mg  4 mg Oral Q8H PRN Faith Rogue, DO       pravastatin (PRAVACHOL) tablet 20 mg  20 mg Oral q1800 Bender, Emily, DO       sodium chloride flush (NS) 0.9 % injection 3 mL  3 mL Intravenous Q12H Bender, Emily, DO   3 mL at 10/19/23 0956    umeclidinium bromide (INCRUSE ELLIPTA) 62.5 MCG/ACT 1 puff  1 puff Inhalation Daily Reymundo Poll, MD        Allergies as of 10/18/2023   (No Known Allergies)    Family History  Problem Relation Age of Onset   CAD Mother     Social History   Socioeconomic History   Marital status: Single    Spouse name: Not on file   Number of children: Not on file   Years of education: Not on file   Highest education level: Not on file  Occupational History   Not on file  Tobacco Use   Smoking status: Every Day    Current packs/day: 1.00    Types: Cigarettes   Smokeless tobacco: Never  Substance and Sexual Activity   Alcohol use: Not Currently   Drug use: Not Currently   Sexual activity: Yes  Other Topics Concern  Not on file  Social History Narrative   Not on file   Social Drivers of Health   Financial Resource Strain: Not on file  Food Insecurity: Not on file  Transportation Needs: Not on file  Physical Activity: Not on file  Stress: Not on file  Social Connections: Not on file  Intimate Partner Violence: Not on file    Review of Systems: Gen: See HPI. CV: + CP for a few days within the past week, no current CP. Resp: + Chronic cough on home oxygen 3 L [place].  GI: See HPI. GU : Denies urinary burning, blood in urine, increased urinary frequency or incontinence. MS: + Generalized weakness. Derm: Denies rash, itchiness, skin lesions or unhealing ulcers. Psych: Denies depression, anxiety, memory loss or confusion. Heme: Denies easy bruising, bleeding. Neuro: + Polyneuropathy. Endo:  Denies any problems with DM, thyroid or adrenal function.  Physical Exam: Vital signs in last 24 hours: Temp:  [97.5 F (36.4 C)-98.4 F (36.9 C)] 98 F (36.7 C) (12/19 0754) Pulse Rate:  [65-103] 65 (12/19 0754) Resp:  [18] 18 (12/19 0754) BP: (89-131)/(47-89) 112/52 (12/19 0754) SpO2:  [83 %-99 %] 99 % (12/19 0754) Weight:  [82.3 kg-83.7 kg] 82.3 kg (12/19 0500) Last BM Date :  10/17/23 General: Chronically ill-appearing 62 year old female in no acute distress. Head:  Normocephalic and atraumatic. Eyes:  No scleral icterus. Conjunctiva pink. Ears:  Normal auditory acuity. Nose:  No deformity, discharge or lesions. Mouth: Absent dentition.  No ulcers or lesions.  Neck:  Supple. No lymphadenopathy or thyromegaly.  Lungs: Breath sounds clear but diminished throughout. No wheezes, rhonchi or crackles.  Heart: Regular rate and rhythm, no murmurs. Abdomen: Obese abdomen, soft, nondistended.  No palpable mass.  No hepatosplenomegaly.  Positive bowel sounds to all 4 quadrants.  No bruit.  Intertrigo/candidiasis to the lower abdominal folds. Rectal: Deferred. Musculoskeletal:  Symmetrical without gross deformities.  Pulses:  Normal pulses noted. Extremities:  Without clubbing or edema. Neurologic:  Alert and  oriented x 4. No focal deficits.  Skin:  Intact without significant lesions or rashes. Psych:  Alert and cooperative. Normal mood and affect.  Intake/Output from previous day: No intake/output data recorded. Intake/Output this shift: No intake/output data recorded.  Lab Results: Recent Labs    10/19/23 0451  WBC 11.8*  HGB 16.4*  HCT 48.5*  PLT 223   BMET Recent Labs    10/19/23 0451  NA 132*  K 3.1*  CL 95*  CO2 29  GLUCOSE 82  BUN 10  CREATININE 1.13*  CALCIUM 8.8*   LFT Recent Labs    10/19/23 0451  ALBUMIN 2.7*   PT/INR No results for input(s): "LABPROT", "INR" in the last 72 hours. Hepatitis Panel Recent Labs    10/19/23 0451  HEPBSAG NON REACTIVE  HCVAB NON REACTIVE  HEPAIGM NON REACTIVE  HEPBIGM NON REACTIVE      Studies/Results: CT CHEST ABDOMEN PELVIS W CONTRAST Result Date: 10/19/2023 CLINICAL DATA:  Unintentional weight loss EXAM: CT CHEST, ABDOMEN, AND PELVIS WITH CONTRAST TECHNIQUE: Multidetector CT imaging of the chest, abdomen and pelvis was performed following the standard protocol during bolus administration  of intravenous contrast. RADIATION DOSE REDUCTION: This exam was performed according to the departmental dose-optimization program which includes automated exposure control, adjustment of the mA and/or kV according to patient size and/or use of iterative reconstruction technique. CONTRAST:  75mL OMNIPAQUE IOHEXOL 350 MG/ML SOLN COMPARISON:  None Available. FINDINGS: CT CHEST FINDINGS Cardiovascular: Extensive multi-vessel coronary artery calcification is  present. Global cardiac size within normal limits. No pericardial effusion. Central pulmonary arteries are of normal caliber. Moderate atherosclerotic calcification within the thoracic aorta. No aortic aneurysm. Mediastinum/Nodes: No enlarged mediastinal, hilar, or axillary lymph nodes. Thyroid gland, trachea, and esophagus demonstrate no significant findings. Lungs/Pleura: Moderate emphysema. No confluent pulmonary infiltrate. No pneumothorax or pleural effusion. Musculoskeletal: No chest wall mass or suspicious bone lesions identified. CT ABDOMEN PELVIS FINDINGS Hepatobiliary: No focal liver abnormality is seen. No gallstones, gallbladder wall thickening, or biliary dilatation. Pancreas: Unremarkable Spleen: Unremarkable Adrenals/Urinary Tract: The adrenal glands are unremarkable. The kidneys are normal in position. Mild bilateral renal cortical atrophy. The kidneys are otherwise unremarkable. The bladder is unremarkable. Stomach/Bowel: Severe sigmoid diverticulosis without superimposed acute inflammatory change. Stomach, small bowel, and large bowel are otherwise unremarkable. Appendix normal. No free intraperitoneal gas or fluid. Vascular/Lymphatic: 3.5 x 2.9 cm infrarenal abdominal aortic aneurysm is present with moderate mixed atherosclerotic plaque. No pathologic adenopathy within the abdomen and pelvis. Reproductive: Uterus and bilateral adnexa are unremarkable. Other: No abdominal wall hernia or abnormality. No abdominopelvic ascites. Musculoskeletal:  Osseous structures are age-appropriate. No acute bone abnormality. IMPRESSION: 1. No acute intrathoracic or intra-abdominal pathology identified. No definite radiographic explanation for the patient's reported symptoms. 2. Extensive multi-vessel coronary artery calcification. 3. Severe sigmoid diverticulosis without superimposed acute inflammatory change. 4. 3.5 cm infrarenal abdominal aortic aneurysm. Recommend follow-up ultrasound every 3 years. (Ref.: J Vasc Surg. 2018; 67:2-77 and J Am Coll Radiol 2013;10(10):789-794.) Aortic Atherosclerosis (ICD10-I70.0) and Emphysema (ICD10-J43.9). Electronically Signed   By: Helyn Numbers M.D.   On: 10/19/2023 02:42    IMPRESSION/PLAN:  62 year old female with N/V and significant weight loss. No hematemesis. Positive early satiety. No abdominal pain. Chest/abd/pelvic CT findings without intrathoracic or intra-abdominal/pelvic pathology to explain her symptoms or weight loss.  -Clear liquid diet -Ondansetron 4 mg p.o. or IV every 6 hours as needed -Consider EGD and colonoscopy during this hospitalization, defer further endoscopic recommendations to Dr. Rhea Belton -Gastric empty study ordered per Dr. Ned Card, hold antiemetic therapy if gastric empty study pursued as an inpatient   Chronic respiratory failure on oxygen 2 to 3 L at home, COPD  Recent chest pain which occurs at rest, no current chest pain.  CT identified extensive multivessel coronary artery calcification. -Consider cardiology consult during this hospitalization  CT identified a 3.5 cm infrarenal abdominal aortic aneurysm  Elevated TSH of 5.762 with normal T4 level   Arnaldo Natal  10/19/2023, 11:59AM

## 2023-10-19 NOTE — Consult Note (Signed)
Referring Provider: Reymundo Poll, MD Primary Care Physician:  Modena Slater, DO Primary Gastroenterologist: Gentry Fitz  Reason for Consultation: Nausea and vomiting, weight loss  HPI: Kim [name] is a 62 y.o. female with a past medical history of obesity, hypertension, hyperlipidemia, infrarenal AAA, chronic tobacco use, chronic respiratory failure on home oxygen 3 L [place], COPD, OSA, CKD stage III and polyneuropathy.   She was seen by her PCP 12/11//2024 for follow-up regarding intractable nausea, vomiting and weight loss.  Labs done at that time showed a WBC count of 10.6.  Hemoglobin 18.  Hemoglobin A1c 5.2. Sodium 133.  Potassium 3.9.  BUN 10.  Creatinine 1.18.  Albumin 3.1.  Total bili 1.3.  Alk phos 106.  AST 19.  ALT 10.  Her PCP recommended hospital admission, however, the patient declined.  A stat CTAP was ordered but was not completed. She was started on Mirtazapine 15 mg nightly to stimulate her appetite. A GI referral was provided and she was scheduled for new patient office visit in our outpatient GI clinic 11/14/2023. She was seen by her PCP for follow-up 12/18 and at that time she reported not eating any solid food x 3 days. She lost 74 pounds over the past year per PCP records. She also endorsed having intermittent chest pain for a few days. She was directly admitted to [facility] for further evaluation.  Labs at 4 AM today showed a WBC count of 11.8.  Hemoglobin 16.4.  Platelets 223.  Sodium 132.  Potassium 3.1.  Glucose 82.  BUN 10.  Creatinine 1.13.  Calcium 8.8.  Albumin 2.7.  GFR 55.  Acute hepatitis panel negative.  TSH 5.762.  AM cortisol level 21.6.  ACTH base 14.5, cortisol at 30 minutes 20.9 and cortisol at 60 minutes 22.5.  HIV nonreactive.  Urinalysis ordered, not yet completed.  Chest/abdominal/pelvic CT with contrast without acute intrathoracic or intraabdominal/pelvic pathology to explain her symptoms, showed extensive multivessel coronary artery calcification,  a 3.5 cm infrarenal abdominal aortic aneurysm and severe sigmoid diverticulosis without evidence of diverticulitis.  A GI consult was requested for further evaluation regarding intractable N/V and weight loss.  She describes having a decreased appetite for the past 2 months with nausea and vomiting once or twice weekly.  She describes emesis as partially digested food.  No coffee-ground or hematemesis.  No upper or lower abdominal pain.  She gets full easily therefore she "nibbles" on food throughout the day and does not eat a full meal.  She tolerates ice cream and pudding and tends to vomit vegetables or meat.  No heartburn.  She sometimes has difficulty swallowing large pills otherwise no dysphagia with solid foods or liquids.  Her bowel pattern varies, often has constipation.  She recently went 3 days without passing a bowel movement then used a suppository 1 or 2 days ago which resulted in passing a brown ball of stool.  No rectal bleeding or black stools.  No diarrhea.  She denies ever having an EGD or colonoscopy.  No known family history of esophageal, gastric, colon, liver or pancreatic cancer.  Father had brain cancer.  She smokes 1/2 to 1 pack of cigarettes daily for the past 50 years.  No alcohol or drug use.  No NSAID use.  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  She lives with her partner Alesia Richards.  Her daughter lives nearby.   Past Medical History:  Diagnosis Date   COPD (chronic obstructive pulmonary disease) (HCC)    High cholesterol    Hypertension    Smoker     Past Surgical History:  Procedure Laterality Date   SALPINGECTOMY N/A     Prior to Admission medications   Medication Sig Start Date End Date Taking? Authorizing Provider  acetaminophen (TYLENOL) 500 MG tablet Take 500 mg by mouth 3 (three) times daily as needed (pain).   Yes [provider]  albuterol (VENTOLIN  HFA) 108 (90 Base) MCG/ACT inhaler Inhale 2 puffs into the lungs every 4 (four) hours as needed for wheezing or shortness of breath. 10/07/22  Yes Champ Mungo, DO  gabapentin (NEURONTIN) 300 MG capsule Take 1 capsule (300 mg total) by mouth 3 (three) times daily. Patient taking differently: Take 300 mg by mouth daily as needed (Feet pain). 10/11/23 11/10/23 Yes Annett Fabian, MD  pravastatin (PRAVACHOL) 20 MG tablet TAKE 1 TABLET BY MOUTH EVERYDAY AT BEDTIME 12/13/22  Yes Patel, Amar, DO  senna-docusate (SENOKOT-S) 8.6-50 MG tablet Take 1 tablet by mouth at bedtime as needed for mild constipation. 05/09/22  Yes Morene Crocker, MD  triamcinolone (KENALOG) 0.025 % ointment Apply 1 Application topically 2 (two) times daily as needed (Itching). 06/17/22  Yes Champ Mungo, DO  umeclidinium bromide (INCRUSE ELLIPTA) 62.5 MCG/ACT AEPB Inhale 1 puff into the lungs daily. 03/07/23 03/06/24 Yes Doran Stabler, DO  amLODipine (NORVASC) 5 MG tablet Take 1 tablet (5 mg total) by mouth daily. 08/12/22 10/11/22  Modena Slater, DO  azithromycin (ZITHROMAX) 500 MG tablet Take 1 tablet (500 mg total) by mouth daily. Patient not taking: Reported on 10/18/2023 03/07/23   Doran Stabler, DO    Current Facility-Administered Medications  Medication Dose Route Frequency Provider Last Rate Last Admin   albuterol (PROVENTIL) (2.5 MG/3ML) 0.083% nebulizer solution 3 mL  3 mL Inhalation Q6H PRN Faith Rogue, DO       enoxaparin (LOVENOX) injection 40 mg  40 mg Subcutaneous Q24H Bender, Emily, DO   40 mg at 10/18/23 2052   nicotine (NICODERM CQ - dosed in mg/24 hours) patch 14 mg  14 mg Transdermal q1800 Faith Rogue, DO   14 mg at 10/18/23 2051   ondansetron (ZOFRAN-ODT) disintegrating tablet 4 mg  4 mg Oral Q8H PRN Faith Rogue, DO       pravastatin (PRAVACHOL) tablet 20 mg  20 mg Oral q1800 Bender, Emily, DO       sodium chloride flush (NS) 0.9 % injection 3 mL  3 mL Intravenous Q12H Bender, Emily, DO   3 mL at 10/19/23 0956    umeclidinium bromide (INCRUSE ELLIPTA) 62.5 MCG/ACT 1 puff  1 puff Inhalation Daily Reymundo Poll, MD        Allergies as of 10/18/2023   (No Known Allergies)    Family History  Problem Relation Age of Onset   CAD Mother     Social History   Socioeconomic History   Marital status: Single    Spouse name: Not on file   Number of children: Not on file   Years of education: Not on file   Highest education level: Not on file  Occupational History   Not on file  Tobacco Use   Smoking status: Every Day    Current packs/day: 1.00    Types: Cigarettes   Smokeless tobacco: Never  Substance and Sexual Activity   Alcohol use: Not Currently   Drug use: Not Currently   Sexual activity: Yes  Other Topics Concern  Not on file  Social History Narrative   Not on file   Social Drivers of Health   Financial Resource Strain: Not on file  Food Insecurity: Not on file  Transportation Needs: Not on file  Physical Activity: Not on file  Stress: Not on file  Social Connections: Not on file  Intimate Partner Violence: Not on file    Review of Systems: Gen: See HPI. CV: + CP for a few days within the past week, no current CP. Resp: + Chronic cough on home oxygen 3 L [place].  GI: See HPI. GU : Denies urinary burning, blood in urine, increased urinary frequency or incontinence. MS: + Generalized weakness. Derm: Denies rash, itchiness, skin lesions or unhealing ulcers. Psych: Denies depression, anxiety, memory loss or confusion. Heme: Denies easy bruising, bleeding. Neuro: + Polyneuropathy. Endo:  Denies any problems with DM, thyroid or adrenal function.  Physical Exam: Vital signs in last 24 hours: Temp:  [97.5 F (36.4 C)-98.4 F (36.9 C)] 98 F (36.7 C) (12/19 0754) Pulse Rate:  [65-103] 65 (12/19 0754) Resp:  [18] 18 (12/19 0754) BP: (89-131)/(47-89) 112/52 (12/19 0754) SpO2:  [83 %-99 %] 99 % (12/19 0754) Weight:  [82.3 kg-83.7 kg] 82.3 kg (12/19 0500) Last BM Date :  10/17/23 General: Chronically ill-appearing 62 year old female in no acute distress. Head:  Normocephalic and atraumatic. Eyes:  No scleral icterus. Conjunctiva pink. Ears:  Normal auditory acuity. Nose:  No deformity, discharge or lesions. Mouth: Absent dentition.  No ulcers or lesions.  Neck:  Supple. No lymphadenopathy or thyromegaly.  Lungs: Breath sounds clear but diminished throughout. No wheezes, rhonchi or crackles.  Heart: Regular rate and rhythm, no murmurs. Abdomen: Obese abdomen, soft, nondistended.  No palpable mass.  No hepatosplenomegaly.  Positive bowel sounds to all 4 quadrants.  No bruit.  Intertrigo/candidiasis to the lower abdominal folds. Rectal: Deferred. Musculoskeletal:  Symmetrical without gross deformities.  Pulses:  Normal pulses noted. Extremities:  Without clubbing or edema. Neurologic:  Alert and  oriented x 4. No focal deficits.  Skin:  Intact without significant lesions or rashes. Psych:  Alert and cooperative. Normal mood and affect.  Intake/Output from previous day: No intake/output data recorded. Intake/Output this shift: No intake/output data recorded.  Lab Results: Recent Labs    10/19/23 0451  WBC 11.8*  HGB 16.4*  HCT 48.5*  PLT 223   BMET Recent Labs    10/19/23 0451  NA 132*  K 3.1*  CL 95*  CO2 29  GLUCOSE 82  BUN 10  CREATININE 1.13*  CALCIUM 8.8*   LFT Recent Labs    10/19/23 0451  ALBUMIN 2.7*   PT/INR No results for input(s): "LABPROT", "INR" in the last 72 hours. Hepatitis Panel Recent Labs    10/19/23 0451  HEPBSAG NON REACTIVE  HCVAB NON REACTIVE  HEPAIGM NON REACTIVE  HEPBIGM NON REACTIVE      Studies/Results: CT CHEST ABDOMEN PELVIS W CONTRAST Result Date: 10/19/2023 CLINICAL DATA:  Unintentional weight loss EXAM: CT CHEST, ABDOMEN, AND PELVIS WITH CONTRAST TECHNIQUE: Multidetector CT imaging of the chest, abdomen and pelvis was performed following the standard protocol during bolus administration  of intravenous contrast. RADIATION DOSE REDUCTION: This exam was performed according to the departmental dose-optimization program which includes automated exposure control, adjustment of the mA and/or kV according to patient size and/or use of iterative reconstruction technique. CONTRAST:  75mL OMNIPAQUE IOHEXOL 350 MG/ML SOLN COMPARISON:  None Available. FINDINGS: CT CHEST FINDINGS Cardiovascular: Extensive multi-vessel coronary artery calcification is  present. Global cardiac size within normal limits. No pericardial effusion. Central pulmonary arteries are of normal caliber. Moderate atherosclerotic calcification within the thoracic aorta. No aortic aneurysm. Mediastinum/Nodes: No enlarged mediastinal, hilar, or axillary lymph nodes. Thyroid gland, trachea, and esophagus demonstrate no significant findings. Lungs/Pleura: Moderate emphysema. No confluent pulmonary infiltrate. No pneumothorax or pleural effusion. Musculoskeletal: No chest wall mass or suspicious bone lesions identified. CT ABDOMEN PELVIS FINDINGS Hepatobiliary: No focal liver abnormality is seen. No gallstones, gallbladder wall thickening, or biliary dilatation. Pancreas: Unremarkable Spleen: Unremarkable Adrenals/Urinary Tract: The adrenal glands are unremarkable. The kidneys are normal in position. Mild bilateral renal cortical atrophy. The kidneys are otherwise unremarkable. The bladder is unremarkable. Stomach/Bowel: Severe sigmoid diverticulosis without superimposed acute inflammatory change. Stomach, small bowel, and large bowel are otherwise unremarkable. Appendix normal. No free intraperitoneal gas or fluid. Vascular/Lymphatic: 3.5 x 2.9 cm infrarenal abdominal aortic aneurysm is present with moderate mixed atherosclerotic plaque. No pathologic adenopathy within the abdomen and pelvis. Reproductive: Uterus and bilateral adnexa are unremarkable. Other: No abdominal wall hernia or abnormality. No abdominopelvic ascites. Musculoskeletal:  Osseous structures are age-appropriate. No acute bone abnormality. IMPRESSION: 1. No acute intrathoracic or intra-abdominal pathology identified. No definite radiographic explanation for the patient's reported symptoms. 2. Extensive multi-vessel coronary artery calcification. 3. Severe sigmoid diverticulosis without superimposed acute inflammatory change. 4. 3.5 cm infrarenal abdominal aortic aneurysm. Recommend follow-up ultrasound every 3 years. (Ref.: J Vasc Surg. 2018; 67:2-77 and J Am Coll Radiol 2013;10(10):789-794.) Aortic Atherosclerosis (ICD10-I70.0) and Emphysema (ICD10-J43.9). Electronically Signed   By: Helyn Numbers M.D.   On: 10/19/2023 02:42    IMPRESSION/PLAN:  62 year old female with N/V and significant weight loss. No hematemesis. Positive early satiety. No abdominal pain. Chest/abd/pelvic CT findings without intrathoracic or intra-abdominal/pelvic pathology to explain her symptoms or weight loss.  -Clear liquid diet -Ondansetron 4 mg p.o. or IV every 6 hours as needed -Consider EGD and colonoscopy during this hospitalization, defer further endoscopic recommendations to Dr. Rhea Belton -Gastric empty study ordered per Dr. Ned Card, hold antiemetic therapy if gastric empty study pursued as an inpatient   Chronic respiratory failure on oxygen 2 to 3 L at home, COPD  Recent chest pain which occurs at rest, no current chest pain.  CT identified extensive multivessel coronary artery calcification. -Consider cardiology consult during this hospitalization  CT identified a 3.5 cm infrarenal abdominal aortic aneurysm  Elevated TSH of 5.762 with normal T4 level   Arnaldo Natal  10/19/2023, 11:59AM

## 2023-10-19 NOTE — Evaluation (Signed)
Occupational Therapy Evaluation Patient Details Name: Kim [name] MRN: [id number] DOB: 23-Jan-1961 Today's Date: 10/19/2023   History of Present Illness The pt is a 62 yo female presenting nausea, vomiting, decreased appetite, and weight loss. PMH includes: COPD, OSA, hypertension, obesity, tobacco use, HTN, infrarenal AAA   Clinical Impression   Pt c/o pain to B feet, 5/10, constant. Pt lives at home with boyfriend and daughter who are available most of the day. Pt close to or at baseline, uses cane for mobility, no LOB, able to ambulate around room as needed, able to stand unsupported as needed to perform standing ADLs, able to transport items as needed. Pt uses slip ons at baseline, is able to bend down and reach floor from sitting position. Pt would benefit from a shower chair for return home, has no further acute OT or follow up needs.        If plan is discharge home, recommend the following: A little help with bathing/dressing/bathroom;Assist for transportation    Functional Status Assessment  Patient has had a recent decline in their functional status and demonstrates the ability to make significant improvements in function in a reasonable and predictable amount of time.  Equipment Recommendations  Tub/shower seat    Recommendations for Other Services       Precautions / Restrictions Precautions Precautions: Fall Restrictions Weight Bearing Restrictions Per Provider Order: No      Mobility Bed Mobility Overal bed mobility: Modified Independent                  Transfers Overall transfer level: Modified independent                        Balance Overall balance assessment: Mild deficits observed, not formally tested                                         ADL either performed or assessed with clinical judgement   ADL Overall ADL's : At baseline;Modified independent                                        General ADL Comments: Pt at baseline, good overall balance with cane, uses slip on shoes, able to bend down to reach floor while sitting, completes ADLs supervision for safety.     Vision Baseline Vision/History: 0 No visual deficits Ability to See in Adequate Light: 0 Adequate Patient Visual Report: No change from baseline       Perception         Praxis         Pertinent Vitals/Pain Pain Assessment Pain Assessment: 0-10 Pain Score: 5  Pain Location: BLEs Pain Descriptors / Indicators: Aching Pain Intervention(s): Monitored during session     Extremity/Trunk Assessment Upper Extremity Assessment Upper Extremity Assessment: RUE deficits/detail;LUE deficits/detail RUE Deficits / Details: Pt reports occasional numbness in all fingertips RUE Coordination: WNL LUE Deficits / Details: Pt reports occasional numbness in all fingertips LUE Coordination: WNL   Lower Extremity Assessment Lower Extremity Assessment: Defer to PT evaluation       Communication Communication Communication: No apparent difficulties   Cognition Arousal: Alert Behavior During Therapy: WFL for tasks assessed/performed Overall Cognitive Status: Within Functional Limits for tasks assessed  General Comments       Exercises     Shoulder Instructions      Home Living Family/patient expects to be discharged to:: Private residence Living Arrangements: Spouse/significant other Available Help at Discharge: Family;Available 24 hours/day Type of Home: House Home Access: Stairs to enter [employer] of Steps: 5 Entrance Stairs-Rails: Can reach both Home Layout: One level     Bathroom Shower/Tub: Runner, broadcasting/film/video: Rollator (4 wheels);Cane - single point;BSC/3in1   Additional Comments: Pt lives with boyfriend and daughter who can assist as needed 24/7      Prior Functioning/Environment Prior Level of  Function : Independent/Modified Independent             Mobility Comments: uses cane ADLs Comments: Pt needs assistance with dressing, family does IADLs        OT Problem List: Decreased activity tolerance;Pain;Obesity      OT Treatment/Interventions:      OT Goals(Current goals can be found in the care plan section) Acute Rehab OT Goals Patient Stated Goal: to return home OT Goal Formulation: With patient Time For Goal Achievement: 11/02/23 Potential to Achieve Goals: Good  OT Frequency:      Co-evaluation              AM-PAC OT "6 Clicks" Daily Activity     Outcome Measure Help from another person eating meals?: None Help from another person taking care of personal grooming?: None Help from another person toileting, which includes using toliet, bedpan, or urinal?: None Help from another person bathing (including washing, rinsing, drying)?: A Little Help from another person to put on and taking off regular upper body clothing?: None Help from another person to put on and taking off regular lower body clothing?: A Little 6 Click Score: 22   End of Session Equipment Utilized During Treatment: Gait belt;Other (comment) (cane) Nurse Communication: Mobility status  Activity Tolerance: Patient tolerated treatment well Patient left: in bed;with call bell/phone within reach  OT Visit Diagnosis: Muscle weakness (generalized) (M62.81);Pain Pain - part of body: Ankle and joints of foot;Leg                Time: 1200-1227 OT Time Calculation (min): 27 min Charges:  OT General Charges $OT Visit: 1 Visit OT Evaluation $OT Eval Low Complexity: 1 Low OT Treatments $Self Care/Home Management : 8-22 mins  [place], OTR/L   Alexis Goodell 10/19/2023, 12:32 PM

## 2023-10-19 NOTE — TOC Initial Note (Signed)
Transition of Care [facility]) - Initial/Assessment Note    Patient Details  Name: Kim [name] MRN: [id number] Date of Birth: Jun 01, 1961  Transition of Care [facility]) CM/SW Contact:    Kermit Balo, RN Phone Number: 10/19/2023, 2:57 PM  Clinical Narrative:                  Pt is from home with her significant other. They are together most of the time.  SO can provide needed transportation. Pt was managing her own medications but her SO is going to oversee once home. Pt states she is wanting an Mining engineer wheelchair. CM updated her that her PCP will need to order this.   TOC following.  Expected Discharge Plan:  (to be determined) Barriers to Discharge: Continued Medical Work up   Patient Goals and CMS Choice            Expected Discharge Plan and Services   Discharge Planning Services: CM Consult   Living arrangements for the past 2 months: Single Family Home                                      Prior Living Arrangements/Services Living arrangements for the past 2 months: Single Family Home Lives with:: Significant Other Patient language and need for interpreter reviewed:: Yes Do you feel safe going back to the place where you live?: Yes        Care giver support system in place?: Yes (comment) Current home services: DME (oxygen/walker/ cane) Criminal Activity/Legal Involvement Pertinent to Current Situation/Hospitalization: No - Comment as needed  Activities of Daily Living      Permission Sought/Granted                  Emotional Assessment Appearance:: Appears stated age Attitude/Demeanor/Rapport: Engaged Affect (typically observed): Accepting Orientation: : Oriented to Self, Oriented to Place, Oriented to  Time, Oriented to Situation   Psych Involvement: No (comment)  Admission diagnosis:  Weight loss, unintentional [R63.4] Patient Active Problem List   Diagnosis Date Noted   Rapid weight loss, unintentional 10/18/2023   Chest pain  10/18/2023   Weight loss, unintentional 10/18/2023   Pressure injury of skin 10/18/2023   Polyneuropathy 10/11/2023   Intractable nausea 09/09/2023   Encounter for screening involving social determinants of health (SDoH) 06/17/2022   Infrarenal abdominal aortic aneurysm (AAA) without rupture (HCC) 05/17/2022   Respiratory failure, chronic (HCC) 05/17/2022   Stable angina (HCC) 05/17/2022   Acute hypoxemic respiratory failure (HCC) 05/02/2022   Erythrocytosis 05/02/2022   Obesity, Class III, BMI 40-49.9 (morbid obesity) (HCC) 05/02/2022   CKD (chronic kidney disease), stage III (HCC) 05/02/2022   OSA (obstructive sleep apnea)    COPD exacerbation (HCC) 09/02/2019   Essential hypertension 09/02/2019   HLD (hyperlipidemia) 09/02/2019   PCP:  Modena Slater, DO Pharmacy:   [id number] - , [place] - [address] [place] [address] Phone: [phone] Fax: [phone]  [facility] [address] [place] [address] Phone: [phone] Fax: [phone]     Social Drivers of Health (SDOH) Social History: SDOH Screenings   Depression (PHQ2-9): Low Risk  (10/11/2023)  Tobacco Use: High Risk (10/11/2023)   SDOH Interventions:     Readmission Risk Interventions    05/09/2022   12:02 PM  Readmission Risk Prevention Plan  Post Dischage  Appt Complete  Medication Screening Complete  Transportation Screening Complete

## 2023-10-20 ENCOUNTER — Inpatient Hospital Stay (HOSPITAL_COMMUNITY): Payer: Medicaid Other | Admitting: Certified Registered"

## 2023-10-20 ENCOUNTER — Encounter (HOSPITAL_COMMUNITY)
Admission: AD | Disposition: A | Discharge status: Home / Self Care | Payer: Self-pay | Source: Ambulatory Visit | Attending: Internal Medicine

## 2023-10-20 ENCOUNTER — Other Ambulatory Visit (HOSPITAL_COMMUNITY): Payer: Self-pay

## 2023-10-20 ENCOUNTER — Encounter (HOSPITAL_COMMUNITY): Payer: Self-pay | Admitting: Internal Medicine

## 2023-10-20 DIAGNOSIS — J449 Chronic obstructive pulmonary disease, unspecified: Secondary | ICD-10-CM | POA: Diagnosis not present

## 2023-10-20 DIAGNOSIS — K2950 Unspecified chronic gastritis without bleeding: Secondary | ICD-10-CM

## 2023-10-20 DIAGNOSIS — R112 Nausea with vomiting, unspecified: Secondary | ICD-10-CM | POA: Diagnosis not present

## 2023-10-20 DIAGNOSIS — K2900 Acute gastritis without bleeding: Secondary | ICD-10-CM

## 2023-10-20 DIAGNOSIS — F17210 Nicotine dependence, cigarettes, uncomplicated: Secondary | ICD-10-CM

## 2023-10-20 DIAGNOSIS — K2970 Gastritis, unspecified, without bleeding: Secondary | ICD-10-CM | POA: Diagnosis not present

## 2023-10-20 DIAGNOSIS — K259 Gastric ulcer, unspecified as acute or chronic, without hemorrhage or perforation: Secondary | ICD-10-CM

## 2023-10-20 DIAGNOSIS — K449 Diaphragmatic hernia without obstruction or gangrene: Secondary | ICD-10-CM

## 2023-10-20 DIAGNOSIS — R7989 Other specified abnormal findings of blood chemistry: Secondary | ICD-10-CM | POA: Diagnosis not present

## 2023-10-20 DIAGNOSIS — K3189 Other diseases of stomach and duodenum: Secondary | ICD-10-CM | POA: Diagnosis not present

## 2023-10-20 DIAGNOSIS — B9681 Helicobacter pylori [H. pylori] as the cause of diseases classified elsewhere: Secondary | ICD-10-CM

## 2023-10-20 DIAGNOSIS — Z9981 Dependence on supplemental oxygen: Secondary | ICD-10-CM | POA: Diagnosis not present

## 2023-10-20 DIAGNOSIS — E43 Unspecified severe protein-calorie malnutrition: Secondary | ICD-10-CM | POA: Insufficient documentation

## 2023-10-20 DIAGNOSIS — A048 Other specified bacterial intestinal infections: Secondary | ICD-10-CM | POA: Diagnosis not present

## 2023-10-20 DIAGNOSIS — G4733 Obstructive sleep apnea (adult) (pediatric): Secondary | ICD-10-CM

## 2023-10-20 DIAGNOSIS — R935 Abnormal findings on diagnostic imaging of other abdominal regions, including retroperitoneum: Secondary | ICD-10-CM | POA: Diagnosis not present

## 2023-10-20 DIAGNOSIS — R6881 Early satiety: Secondary | ICD-10-CM | POA: Diagnosis not present

## 2023-10-20 DIAGNOSIS — K279 Peptic ulcer, site unspecified, unspecified as acute or chronic, without hemorrhage or perforation: Secondary | ICD-10-CM | POA: Insufficient documentation

## 2023-10-20 DIAGNOSIS — J9610 Chronic respiratory failure, unspecified whether with hypoxia or hypercapnia: Secondary | ICD-10-CM | POA: Diagnosis not present

## 2023-10-20 DIAGNOSIS — R634 Abnormal weight loss: Secondary | ICD-10-CM | POA: Diagnosis not present

## 2023-10-20 HISTORY — PX: BIOPSY: SHX5522

## 2023-10-20 HISTORY — PX: ESOPHAGOGASTRODUODENOSCOPY (EGD) WITH PROPOFOL: SHX5813

## 2023-10-20 LAB — RENAL FUNCTION PANEL
Albumin: 2.7 g/dL — ABNORMAL LOW (ref 3.5–5.0)
Anion gap: 8 (ref 5–15)
BUN: 9 mg/dL (ref 8–23)
CO2: 32 mmol/L (ref 22–32)
Calcium: 9 mg/dL (ref 8.9–10.3)
Chloride: 94 mmol/L — ABNORMAL LOW (ref 98–111)
Creatinine, Ser: 1.27 mg/dL — ABNORMAL HIGH (ref 0.44–1.00)
GFR, Estimated: 48 mL/min — ABNORMAL LOW (ref >60)
Glucose, Bld: 88 mg/dL (ref 70–99)
Phosphorus: 2.9 mg/dL (ref 2.5–4.6)
Potassium: 3.9 mmol/L (ref 3.5–5.1)
Sodium: 134 mmol/L — ABNORMAL LOW (ref 135–145)

## 2023-10-20 LAB — CBC
HCT: 45.4 % (ref 36.0–46.0)
Hemoglobin: 15.7 g/dL — ABNORMAL HIGH (ref 12.0–15.0)
MCH: 33.6 pg (ref 26.0–34.0)
MCHC: 34.6 g/dL (ref 30.0–36.0)
MCV: 97.2 fL (ref 80.0–100.0)
Platelets: 203 10*3/uL (ref 150–400)
RBC: 4.67 MIL/uL (ref 3.87–5.11)
RDW: 16.8 % — ABNORMAL HIGH (ref 11.5–15.5)
WBC: 10.6 10*3/uL — ABNORMAL HIGH (ref 4.0–10.5)
nRBC: 0 % (ref 0.0–0.2)

## 2023-10-20 LAB — MAGNESIUM: Magnesium: 2.2 mg/dL (ref 1.7–2.4)

## 2023-10-20 LAB — ACTH: C206 ACTH: 106 pg/mL — ABNORMAL HIGH (ref 7.2–63.3)

## 2023-10-20 SURGERY — ESOPHAGOGASTRODUODENOSCOPY (EGD) WITH PROPOFOL
Anesthesia: Monitor Anesthesia Care

## 2023-10-20 MED ORDER — ENSURE COMPLETE SHAKE PO LIQD
1.0000 | Freq: Two times a day (BID) | ORAL | 2 refills | Status: AC
  Filled 2023-10-20: qty 237, fill #0

## 2023-10-20 MED ORDER — PROPOFOL 500 MG/50ML IV EMUL
INTRAVENOUS | Status: DC | PRN
  Administered 2023-10-20: 75 ug/kg/min via INTRAVENOUS

## 2023-10-20 MED ORDER — PANTOPRAZOLE SODIUM 40 MG PO TBEC
40.0000 mg | DELAYED_RELEASE_TABLET | Freq: Two times a day (BID) | ORAL | 0 refills | Status: DC
  Filled 2023-10-20: qty 60, 30d supply, fill #0

## 2023-10-20 MED ORDER — PANTOPRAZOLE SODIUM 40 MG PO TBEC
40.0000 mg | DELAYED_RELEASE_TABLET | Freq: Two times a day (BID) | ORAL | Status: DC
  Administered 2023-10-20: 40 mg via ORAL
  Filled 2023-10-20: qty 1

## 2023-10-20 MED ORDER — SUCRALFATE 1 GM/10ML PO SUSP
1.0000 g | Freq: Three times a day (TID) | ORAL | Status: DC
  Administered 2023-10-20: 1 g via ORAL
  Filled 2023-10-20: qty 10

## 2023-10-20 MED ORDER — SUCRALFATE 1 GM/10ML PO SUSP
1.0000 g | Freq: Three times a day (TID) | ORAL | 0 refills | Status: DC
  Filled 2023-10-20: qty 473, 12d supply, fill #0

## 2023-10-20 MED ORDER — PROPOFOL 10 MG/ML IV BOLUS
INTRAVENOUS | Status: DC | PRN
  Administered 2023-10-20: 50 mg via INTRAVENOUS

## 2023-10-20 MED ORDER — SODIUM CHLORIDE 0.9 % IV SOLN: INTRAVENOUS | Status: DC | PRN

## 2023-10-20 MED ORDER — LIDOCAINE 2% (20 MG/ML) 5 ML SYRINGE
INTRAMUSCULAR | Status: DC | PRN
  Administered 2023-10-20: 60 mg via INTRAVENOUS

## 2023-10-20 SURGICAL SUPPLY — 14 items
BLOCK BITE 60FR ADLT L/F BLUE (MISCELLANEOUS) ×3 IMPLANT
ELECT REM PT RETURN 9FT ADLT (ELECTROSURGICAL)
ELECTRODE REM PT RTRN 9FT ADLT (ELECTROSURGICAL) IMPLANT
FORCEP RJ3 GP 1.8X160 W-NEEDLE (CUTTING FORCEPS) IMPLANT
FORCEPS BIOP RAD 4 LRG CAP 4 (CUTTING FORCEPS) IMPLANT
NDL SCLEROTHERAPY 25GX240 (NEEDLE) IMPLANT
NEEDLE SCLEROTHERAPY 25GX240 (NEEDLE) IMPLANT
PROBE APC STR FIRE (PROBE) IMPLANT
PROBE INJECTION GOLD 7FR (MISCELLANEOUS) IMPLANT
SNARE SHORT THROW 13M SML OVAL (MISCELLANEOUS) IMPLANT
SYR 50ML LL SCALE MARK (SYRINGE) IMPLANT
TUBING ENDO SMARTCAP PENTAX (MISCELLANEOUS) ×6 IMPLANT
TUBING IRRIGATION ENDOGATOR (MISCELLANEOUS) ×3 IMPLANT
WATER STERILE IRR 1000ML POUR (IV SOLUTION) IMPLANT

## 2023-10-20 NOTE — Progress Notes (Signed)
Multiple attempts made to connect with security to get patient belongings for discharge. RN walked to security office and waited for 30 min. Called main security number and was transferred to office with no answer. RN called back and was told someone would be radio'd to 5 central nursing station. Patient has been waiting for over two hours for belongings, mainly their wallet. Copy of yellow sheet with belongings taken is available at nurses station. Pt is very upset, stating she wants to go home. Pt and husband decided to go wait in the car until someone has retrieved the items. Rn explained risk of lost items if patient decided to leave. Pt agrees and asked to be called when we have her things.

## 2023-10-20 NOTE — Op Note (Signed)
[facility] Patient Name: Kim [name] Procedure Date : 10/20/2023 MRN: [id number] Attending MD: Beverley Fiedler , MD, [id number] Date of Birth: 11-10-1960 CSN: [id number] Age: 62 Admit Type: Inpatient Procedure:                Upper GI endoscopy Indications:              Anorexia, Early satiety, Nausea with vomiting,                            Weight loss Providers:                Carie Caddy. Rhea Belton, MD, Suzy Bouchard, RN, Alan Ripper,                            Technician Referring MD:             Reymundo Poll Medicines:                Monitored Anesthesia Care Complications:            No immediate complications. Estimated Blood Loss:     Estimated blood loss was minimal. Procedure:                Pre-Anesthesia Assessment:                           - Prior to the procedure, a History and Physical                            was performed, and patient medications and                            allergies were reviewed. The patient's tolerance of                            previous anesthesia was also reviewed. The risks                            and benefits of the procedure and the sedation                            options and risks were discussed with the patient.                            All questions were answered, and informed consent                            was obtained. Prior Anticoagulants: The patient has                            taken no anticoagulant or antiplatelet agents. ASA                            Grade Assessment: III - A patient with severe  systemic disease. After reviewing the risks and                            benefits, the patient was deemed in satisfactory                            condition to undergo the procedure.                           After obtaining informed consent, the endoscope was                            passed under direct vision. Throughout the                            procedure, the  patient's blood pressure, pulse, and                            oxygen saturations were monitored continuously. The                            [id number]-[id number] ([id number]) Olympus endoscope was introduced                            through the mouth, and advanced to the second part                            of duodenum. The upper GI endoscopy was                            accomplished without difficulty. The patient                            tolerated the procedure well. Scope In: Scope Out: Findings:      The examined esophagus was normal.      A 2 cm hiatal hernia was present.      Severe inflammation characterized by congestion (edema), erosions,       erythema, granularity and shallow ulcerations was found in the gastric       body, at the incisura and in the gastric antrum. Biopsies were taken       with a cold forceps for histology and Helicobacter pylori testing.      The examined duodenum was normal. Impression:               - Normal esophagus.                           - 2 cm hiatal hernia.                           - Acute, ulcerative and erosive gastritis. Biopsied                            to exclude H. Pylori.                           -  Normal examined duodenum. Moderate Sedation:      N/A Recommendation:           - Return patient to hospital ward for ongoing care.                           - Advance diet as tolerated.                           - Continue present medications. BID PPI recommended                            x 8 weeks, and perhaps daily thereafter depending                            on symptoms.                           - Await pathology results to exclude H. Pylori                            infection.                           - No NSAIDs.                           - Colonoscopy is recommended, but should be                            deferred to outpatient setting given current upper                            GI symptoms, inability to tolerate colon  prep at                            this time and findings on this exam.                           - GI will sign off, call if questions. Procedure Code(s):        --- Professional ---                           [phone], Esophagogastroduodenoscopy, flexible,                            transoral; with biopsy, single or multiple Diagnosis Code(s):        --- Professional ---                           K44.9, Diaphragmatic hernia without obstruction or                            gangrene                           K29.00, Acute gastritis without bleeding  R63.0, Anorexia                           R68.81, Early satiety                           R11.2, Nausea with vomiting, unspecified                           R63.4, Abnormal weight loss CPT copyright 2022 [employer]. All rights reserved. The codes documented in this report are preliminary and upon coder review may  be revised to meet current compliance requirements. Beverley Fiedler, MD 10/20/2023 10:42:10 AM This report has been signed electronically. Number of Addenda: 0

## 2023-10-20 NOTE — Interval H&P Note (Signed)
History and Physical Interval Note: For EGD this morning to eval n/v and weight loss.  Early satiety and very poor appetite The nature of the procedure, as well as the risks, benefits, and alternatives were carefully and thoroughly reviewed with the patient. Ample time for discussion and questions allowed. The patient understood, was satisfied, and agreed to proceed.    10/20/2023 8:49 AM  Kim [name]  has presented today for surgery, with the diagnosis of Nausea, vomiting and weight loss.  The various methods of treatment have been discussed with the patient and family. After consideration of risks, benefits and other options for treatment, the patient has consented to  Procedure(s): ESOPHAGOGASTRODUODENOSCOPY (EGD) WITH PROPOFOL (N/A) as a surgical intervention.  The patient's history has been reviewed, patient examined, no change in status, stable for surgery.  I have reviewed the patient's chart and labs.  Questions were answered to the patient's satisfaction.     Carie Caddy Jennea Rager

## 2023-10-20 NOTE — Anesthesia Preprocedure Evaluation (Addendum)
Anesthesia Evaluation  Patient identified by MRN, date of birth, ID band Patient awake    Reviewed: Allergy & Precautions, NPO status , Patient's Chart, lab work & pertinent test results  Airway Mallampati: I  TM Distance: >3 FB Neck ROM: Full    Dental  (+) Edentulous Upper, Edentulous Lower, Dental Advisory Given   Pulmonary sleep apnea , COPD (3L O2 at night),  COPD inhaler and oxygen dependent, Current Smoker   Pulmonary exam normal breath sounds clear to auscultation       Cardiovascular hypertension, pulmonary hypertension+ angina  Normal cardiovascular exam Rhythm:Regular Rate:Normal  TTE 2024 1. Left ventricular ejection fraction, by estimation, is 60 to 65%. The  left ventricle has normal function. The left ventricle has no regional  wall motion abnormalities. There is mild concentric left ventricular  hypertrophy.   2. Right ventricular systolic function is low normal. The right  ventricular size is moderately enlarged. There is moderately elevated  pulmonary artery systolic pressure.   3. Right atrial size was mildly dilated.   4. The mitral valve is normal in structure. No evidence of mitral valve  regurgitation. No evidence of mitral stenosis. Moderate mitral annular  calcification.   5. The aortic valve is normal in structure. Aortic valve regurgitation is  not visualized. Aortic valve sclerosis is present, with no evidence of  aortic valve stenosis.   6. The inferior vena cava is dilated in size with >50% respiratory  variability, suggesting right atrial pressure of 8 mmHg.     Neuro/Psych negative neurological ROS  negative psych ROS   GI/Hepatic negative GI ROS, Neg liver ROS,,,  Endo/Other    Class 3 obesity  Renal/GU Renal InsufficiencyRenal diseaseLab Results      Component                Value               Date                      NA                       134 (L)             10/20/2023                 CL                       94 (L)              10/20/2023                K                        3.9                 10/20/2023                CO2                      32                  10/20/2023                BUN                      9  10/20/2023                CREATININE               1.27 (H)            10/20/2023                GFRNONAA                 48 (L)              10/20/2023                CALCIUM                  9.0                 10/20/2023                PHOS                     2.9                 10/20/2023                ALBUMIN                  2.7 (L)             10/20/2023                GLUCOSE                  88                  10/20/2023             negative genitourinary   Musculoskeletal negative musculoskeletal ROS (+)    Abdominal   Peds  Hematology negative hematology ROS (+)   Anesthesia Other Findings   Reproductive/Obstetrics                             Anesthesia Physical Anesthesia Plan  ASA: 3  Anesthesia Plan: MAC   Post-op Pain Management:    Induction: Intravenous  PONV Risk Score and Plan: Propofol infusion and Treatment may vary due to age or medical condition  Airway Management Planned: Natural Airway  Additional Equipment:   Intra-op Plan:   Post-operative Plan:   Informed Consent: I have reviewed the patients History and Physical, chart, labs and discussed the procedure including the risks, benefits and alternatives for the proposed anesthesia with the patient or authorized representative who has indicated his/her understanding and acceptance.     Dental advisory given  Plan Discussed with: CRNA  Anesthesia Plan Comments:        Anesthesia Quick Evaluation

## 2023-10-20 NOTE — Transfer of Care (Signed)
Immediate Anesthesia Transfer of Care Note  Patient: Kim [name]  Procedure(s) Performed: ESOPHAGOGASTRODUODENOSCOPY (EGD) WITH PROPOFOL BIOPSY  Patient Location: PACU and Endoscopy Unit  Anesthesia Type:MAC  Level of Consciousness: awake, alert , and oriented  Airway & Oxygen Therapy: Patient Spontanous Breathing  Post-op Assessment: Report given to RN and Post -op Vital signs reviewed and stable  Post vital signs: Reviewed and stable  Last Vitals:  Vitals Value Taken Time  BP 108/56 10/20/23 1015  Temp    Pulse 68 10/20/23 1017  Resp 29 10/20/23 1017  SpO2 99 % 10/20/23 1017  Vitals shown include unfiled device data.  Last Pain:  Vitals:   10/20/23 0855  TempSrc: Temporal  PainSc: 7       Patients Stated Pain Goal: 0 (10/19/23 1451)  Complications: No notable events documented.

## 2023-10-20 NOTE — Plan of Care (Signed)
Patient alert/oriented X4. Patient compliant with medication administration and remained NPO for upper endoscopy. AVS discharge instructions explained in detail and PIV removed prior to discharge. Patient has no issues or complaints at this time. VSS.   Problem: Education: Goal: Knowledge of General Education information will improve Description: Including pain rating scale, medication(s)/side effects and non-pharmacologic comfort measures Outcome: Adequate for Discharge   Problem: Health Behavior/Discharge Planning: Goal: Ability to manage health-related needs will improve Outcome: Adequate for Discharge   Problem: Clinical Measurements: Goal: Ability to maintain clinical measurements within normal limits will improve Outcome: Adequate for Discharge   Problem: Clinical Measurements: Goal: Will remain free from infection Outcome: Adequate for Discharge   Problem: Clinical Measurements: Goal: Diagnostic test results will improve Outcome: Adequate for Discharge   Problem: Clinical Measurements: Goal: Respiratory complications will improve Outcome: Adequate for Discharge   Problem: Clinical Measurements: Goal: Cardiovascular complication will be avoided Outcome: Adequate for Discharge   Problem: Activity: Goal: Risk for activity intolerance will decrease Outcome: Adequate for Discharge   Problem: Nutrition: Goal: Adequate nutrition will be maintained Outcome: Adequate for Discharge   Problem: Coping: Goal: Level of anxiety will decrease Outcome: Adequate for Discharge   Problem: Elimination: Goal: Will not experience complications related to bowel motility Outcome: Adequate for Discharge   Problem: Elimination: Goal: Will not experience complications related to urinary retention Outcome: Adequate for Discharge   Problem: Pain Management: Goal: General experience of comfort will improve Outcome: Adequate for Discharge   Problem: Safety: Goal: Ability to remain free  from injury will improve Outcome: Adequate for Discharge   Problem: Skin Integrity: Goal: Risk for impaired skin integrity will decrease Outcome: Adequate for Discharge

## 2023-10-20 NOTE — Plan of Care (Signed)
A/Ox4 and on room air. PRN tylenol given x1 for leg aches. Up with standby assist and cane. NPO since midnight for EGD.    Problem: Education: Goal: Knowledge of General Education information will improve Description: Including pain rating scale, medication(s)/side effects and non-pharmacologic comfort measures Outcome: Progressing   Problem: Health Behavior/Discharge Planning: Goal: Ability to manage health-related needs will improve Outcome: Progressing   Problem: Clinical Measurements: Goal: Ability to maintain clinical measurements within normal limits will improve Outcome: Progressing Goal: Will remain free from infection Outcome: Progressing Goal: Diagnostic test results will improve Outcome: Progressing Goal: Respiratory complications will improve Outcome: Progressing Goal: Cardiovascular complication will be avoided Outcome: Progressing   Problem: Activity: Goal: Risk for activity intolerance will decrease Outcome: Progressing   Problem: Nutrition: Goal: Adequate nutrition will be maintained Outcome: Progressing   Problem: Coping: Goal: Level of anxiety will decrease Outcome: Progressing   Problem: Elimination: Goal: Will not experience complications related to bowel motility Outcome: Progressing Goal: Will not experience complications related to urinary retention Outcome: Progressing   Problem: Pain Management: Goal: General experience of comfort will improve Outcome: Progressing   Problem: Safety: Goal: Ability to remain free from injury will improve Outcome: Progressing   Problem: Skin Integrity: Goal: Risk for impaired skin integrity will decrease Outcome: Progressing

## 2023-10-20 NOTE — Discharge Instructions (Signed)
You came to the hospital for weight loss, nausea, and vomiting and you were diagnosed with acute gastritis with erosions and ulcers (inflammation of the stomach).  We treated you with Protonix and Carafate.    *For your weight loss from acute gastritis with erosions and ulceration  -We have started you on these following medications:  -Protonix (pantoprazole), please take this medication twice a day   -Carafate (sucralfate), please take three times day, at least 30-60 minutes prior to eating food or taking other medications.    -Please see Gastroenterologist (Stomach Dr) in 2-4 weeks or soonest available appointment  -It is very important that you undergo cancer screening tests such a mammogram, colonoscopy, Lung CT scan. These are important to screen for cancer's early.    *For your Blood pressure -Please do not take amlodipine (Norvasc) until you follow up with your PCP (family doctor's) office     Follow-up appointments: Please visit your family doctor in 7 to 10 days Please visit Gastroenterologist ([address]omach Dr) in 2-4 weeks or soonest available appointment   If you have any questions or concerns please feel free to call: Internal medicine clinic at [phone]   If you have any of these following symptoms, please call us or seek care at an emergency department: -Chest Pain -Difficulty Breathing -Worsening abdominal pain -Syncope (passing out) -Drooping of face -Slurred speech -Sudden weakness in your leg or arm -Fever -Chills -Please return if you are no longer able to drink fluids or eat food    We are glad that you are feeling better, it was a pleasure to care for you!  Faith Rogue DO

## 2023-10-20 NOTE — Plan of Care (Addendum)
Alert/Oriented X4. Patient compliant with medication administration. Patient PIV removed prior to discharge and AVS discharge instructions explained in detail. Patient medications given to patient from [facility] inpatient pharmacy. Attempted to get patient wallet from security office multiple times. First time they could not find the wallet and asked to bring down the patient valuables envelope with her personal information in it. Went back down, nobody answered, attempted to contact security via phone multiple times. Pending security update and leadership aware of situation. Patient visibly upset and is going out to her car to wait until security returns her wallet to the nursing unit. VSS upon discharge.   Problem: Education: Goal: Knowledge of General Education information will improve Description: Including pain rating scale, medication(s)/side effects and non-pharmacologic comfort measures 10/20/2023 1558 by Rodman Pickle, RN Outcome: Adequate for Discharge 10/20/2023 1311 by Rodman Pickle, RN Outcome: Adequate for Discharge   Problem: Health Behavior/Discharge Planning: Goal: Ability to manage health-related needs will improve 10/20/2023 1558 by Rodman Pickle, RN Outcome: Adequate for Discharge 10/20/2023 1311 by Rodman Pickle, RN Outcome: Adequate for Discharge   Problem: Clinical Measurements: Goal: Ability to maintain clinical measurements within normal limits will improve 10/20/2023 1558 by Rodman Pickle, RN Outcome: Adequate for Discharge 10/20/2023 1311 by Rodman Pickle, RN Outcome: Adequate for Discharge   Problem: Clinical Measurements: Goal: Will remain free from infection 10/20/2023 1558 by Rodman Pickle, RN Outcome: Adequate for Discharge 10/20/2023 1311 by Rodman Pickle, RN Outcome: Adequate for Discharge   Problem: Clinical Measurements: Goal: Diagnostic test results will improve 10/20/2023 1558 by Rodman Pickle, RN Outcome: Adequate for Discharge 10/20/2023  1311 by Rodman Pickle, RN Outcome: Adequate for Discharge   Problem: Clinical Measurements: Goal: Cardiovascular complication will be avoided 10/20/2023 1558 by Rodman Pickle, RN Outcome: Adequate for Discharge 10/20/2023 1311 by Rodman Pickle, RN Outcome: Adequate for Discharge   Problem: Activity: Goal: Risk for activity intolerance will decrease 10/20/2023 1558 by Rodman Pickle, RN Outcome: Adequate for Discharge 10/20/2023 1311 by Rodman Pickle, RN Outcome: Adequate for Discharge   Problem: Nutrition: Goal: Adequate nutrition will be maintained 10/20/2023 1558 by Rodman Pickle, RN Outcome: Adequate for Discharge 10/20/2023 1311 by Rodman Pickle, RN Outcome: Adequate for Discharge   Problem: Coping: Goal: Level of anxiety will decrease 10/20/2023 1558 by Rodman Pickle, RN Outcome: Adequate for Discharge 10/20/2023 1311 by Rodman Pickle, RN Outcome: Adequate for Discharge   Problem: Elimination: Goal: Will not experience complications related to bowel motility 10/20/2023 1558 by Rodman Pickle, RN Outcome: Adequate for Discharge 10/20/2023 1311 by Rodman Pickle, RN Outcome: Adequate for Discharge   Problem: Elimination: Goal: Will not experience complications related to urinary retention 10/20/2023 1558 by Rodman Pickle, RN Outcome: Adequate for Discharge 10/20/2023 1311 by Rodman Pickle, RN Outcome: Adequate for Discharge   Problem: Pain Management: Goal: General experience of comfort will improve 10/20/2023 1558 by Rodman Pickle, RN Outcome: Adequate for Discharge 10/20/2023 1311 by Rodman Pickle, RN Outcome: Adequate for Discharge   Problem: Skin Integrity: Goal: Risk for impaired skin integrity will decrease 10/20/2023 1558 by Rodman Pickle, RN Outcome: Adequate for Discharge 10/20/2023 1311 by Rodman Pickle, RN Outcome: Adequate for Discharge

## 2023-10-20 NOTE — Discharge Summary (Signed)
Name: Kim [name] MRN: [id number] DOB: 01-17-1961 62 y.o. PCP: Modena Slater, DO  Date of Admission: 10/18/2023  4:35 PM Date of Discharge: 10/20/2023 1:16 PM Attending Physician: Dr. Antony Contras  Discharge Diagnosis: Principal Problem:   Weight loss, unintentional Active Problems:   Essential hypertension   HLD (hyperlipidemia)   OSA (obstructive sleep apnea)   CKD (chronic kidney disease), stage III (HCC)   Infrarenal abdominal aortic aneurysm (AAA) without rupture (HCC)   Respiratory failure, chronic (HCC)   Intractable nausea   Rapid weight loss, unintentional   Pressure injury of skin   Malnutrition of moderate degree   Acute gastritis without hemorrhage   Multiple gastric ulcers   Severe malnutrition (HCC)   PUD (peptic ulcer disease)    Discharge Medications: Allergies as of 10/20/2023   No Known Allergies      Medication List     PAUSE taking these medications    amLODipine 5 MG tablet Wait to take this until your doctor or other care provider tells you to start again. Commonly known as: NORVASC Take 1 tablet (5 mg total) by mouth daily.       STOP taking these medications    azithromycin 500 MG tablet Commonly known as: ZITHROMAX   gabapentin 300 MG capsule Commonly known as: Neurontin       TAKE these medications    acetaminophen 500 MG tablet Commonly known as: TYLENOL Take 500 mg by mouth 3 (three) times daily as needed (pain).   albuterol 108 (90 Base) MCG/ACT inhaler Commonly known as: VENTOLIN HFA Inhale 2 puffs into the lungs every 4 (four) hours as needed for wheezing or shortness of breath.   Ensure Complete Shake Liqd Take 1 each by mouth in the morning and at bedtime.   pantoprazole 40 MG tablet Commonly known as: PROTONIX Take 1 tablet (40 mg total) by mouth 2 (two) times daily before a meal.   pravastatin 20 MG tablet Commonly known as: PRAVACHOL TAKE 1 TABLET BY MOUTH EVERYDAY AT BEDTIME   Senexon-S 8.6-50 MG  tablet Generic drug: senna-docusate Take 1 tablet by mouth at bedtime as needed for mild constipation.   sucralfate 1 GM/10ML suspension Commonly known as: CARAFATE Take 10 mLs (1 g total) by mouth 4 (four) times daily -  with meals and at bedtime.   triamcinolone 0.025 % ointment Commonly known as: KENALOG Apply 1 Application topically 2 (two) times daily as needed (Itching).   umeclidinium bromide 62.5 MCG/ACT Aepb Commonly known as: INCRUSE ELLIPTA Inhale 1 puff into the lungs daily.               Discharge Care Instructions  (From admission, onward)           Start     Ordered   10/20/23 0000  Discharge wound care:       Comments: Apply barrier cream on the buttlock daily and after defecation   10/20/23 1253            Disposition and follow-up:   Kim [name] was discharged from [facility] in Stable condition.  At the hospital follow up visit please address:  1.  Follow-up:  *Peptic Ulcer Disease, malnutrition, unintentional weight loss -Ensure patient is adherent with PPI BID and Carafate TID  -Please review how to appropriately take Carafate and PPI -Please ensure patient follows up with GI -Consider referral to nutritional services -Educate patient on importance of routine cancer screening   *Hypertension  -Please note  that we have asked patient to hold amlodipine due to hypotension during hospitalization  -Restart when appropriate   *Subacute hypothyroidism  -Recheck TSH in 4-6 weeks   *Infrarenal AAA -Stable at 3.5cm x2.9cm, recheck with [name] in three years    2.  Labs / imaging needed at time of follow-up: TSH (4-6 weeks), BMP, CBC  3.  Pending labs/ test needing follow-up: EGD surgical pathology, Glia (IgA/G) + tTG IgA  4.  Medication Changes  STOPPED  -Holding amlodipine   -Gabapentin (pt denied using medication)   -Zithromax (pt denied taking medication)    ADDED  -Carafate TID prior to meals  -Protonix  40mg  BID for 8 weeks, then daily     MODIFIED  -N/A     Follow-up Information     Modena Slater, DO Follow up in 1 week(s).   Specialty: Internal Medicine Contact information: [address] [place] [address] [phone]         Beverley Fiedler, MD Follow up in 4 week(s).   Specialty: Gastroenterology Contact information: [address] [place] [address] [phone]                 Hospital Course by problem list: #PUD #Acute ulcerative and erosive gastritis  #Unintentional weight loss #Nausea, vomiting Patient presents with a progressive weight loss that is associated with anorexia, nausea, vomiting, and changes to bowel habits (new constipation). Hepatis panel is negative, HIV is negative. CT imaging did not reveal pathology, we considered gastroparesis due to quick onset of nausea after eating but she did not want to proceed with the gastric emptying study. GI was consulted due to concerning symptoms of weight loss and intractable N/V. Endoscopy revealed acute ulcerative and erosive gastritis. She will need to follow up with GI in the office for colonoscopy. Patient needs to take a PPI BID for 8 weeks.    #Severe Malnutrition  Patient's severe malnutrition multifactorial due to acute ulcerative and erosive gastritis as well as chronic conditions of poor fitting dentures. Consider referral to nutritional services.   #Subclinical hypothyroidism TSH elevated at 5.762, she will need outpatient TSH monitoring    #COPD #OSA #Tobacco Use Patient used supplemental oxygen at night. Continued home regimen.   #HTN Reported taking amlodipine 10mg , this was held for hypotension.    #Infrarenal abdominal aortic aneurysm 3.5 cm  #HLD Continued home pravastatin 20 mg for HLD, recommended [name] every three years.   #CKD stage III Scr baseline appears to be 1.10-1.20, remained stable.        Discharge Subjective:  Patient is able to drink liquids and denied  nausea or vomiting. She reported feeling ready to go home and understands the importance of following up with both the [facility] and GI.   Discharge Exam:   Blood pressure 127/64, pulse 66, temperature (!) 97.1 F (36.2 C), temperature source Temporal, resp. rate 18, weight 40.2 kg, SpO2 97%.  Constitutional:Chronically ill-appearing, in no acute distress Cardiovascular: regular rate and rhythm, no m/r/g, Pulmonary/Chest: normal work of breathing on room air, lungs clear to auscultation bilaterally. No crackles  Abdominal: soft, non-tender, non-distended.  Neurological: alert & oriented x 3 Skin: warm and dry Psych: Normal mood and flat affect  Pertinent Labs, Studies, and Procedures:     Latest Ref Rng & Units 10/20/2023    5:23 AM 10/19/2023    4:51 AM 10/11/2023    2:26 PM  CBC  WBC 4.0 - 10.5 K/uL 10.6  11.8  10.6  Hemoglobin 12.0 - 15.0 g/dL 81.1  91.4  78.2   Hematocrit 36.0 - 46.0 % 45.4  48.5  54.2   Platelets 150 - 400 K/uL 203  223  210        Latest Ref Rng & Units 10/20/2023    7:47 AM 10/19/2023    4:51 AM 10/11/2023    2:26 PM  CMP  Glucose 70 - 99 mg/dL 88  82  99   BUN 8 - 23 mg/dL 9  10  10    Creatinine 0.44 - 1.00 mg/dL 9.56  2.13  0.86   Sodium 135 - 145 mmol/L 134  132  133   Potassium 3.5 - 5.1 mmol/L 3.9  3.1  3.9   Chloride 98 - 111 mmol/L 94  95  94   CO2 22 - 32 mmol/L 32  29  29   Calcium 8.9 - 10.3 mg/dL 9.0  8.8  9.5   Total Protein 6.5 - 8.1 g/dL   7.3   Total Bilirubin <1.2 mg/dL   1.3   Alkaline Phos 38 - 126 U/L   106   AST 15 - 41 U/L   19   ALT 0 - 44 U/L   10     CT CHEST ABDOMEN PELVIS W CONTRAST Result Date: 10/19/2023 CLINICAL DATA:  Unintentional weight loss EXAM: CT CHEST, ABDOMEN, AND PELVIS WITH CONTRAST TECHNIQUE: Multidetector CT imaging of the chest, abdomen and pelvis was performed following the standard protocol during bolus administration of intravenous contrast. RADIATION DOSE REDUCTION: This exam was performed according to  the departmental dose-optimization program which includes automated exposure control, adjustment of the mA and/or kV according to patient size and/or use of iterative reconstruction technique. CONTRAST:  75mL OMNIPAQUE IOHEXOL 350 MG/ML SOLN COMPARISON:  None Available. FINDINGS: CT CHEST FINDINGS Cardiovascular: Extensive multi-vessel coronary artery calcification is present. Global cardiac size within normal limits. No pericardial effusion. Central pulmonary arteries are of normal caliber. Moderate atherosclerotic calcification within the thoracic aorta. No aortic aneurysm. Mediastinum/Nodes: No enlarged mediastinal, hilar, or axillary lymph nodes. Thyroid gland, trachea, and esophagus demonstrate no significant findings. Lungs/Pleura: Moderate emphysema. No confluent pulmonary infiltrate. No pneumothorax or pleural effusion. Musculoskeletal: No chest wall mass or suspicious bone lesions identified. CT ABDOMEN PELVIS FINDINGS Hepatobiliary: No focal liver abnormality is seen. No gallstones, gallbladder wall thickening, or biliary dilatation. Pancreas: Unremarkable Spleen: Unremarkable Adrenals/Urinary Tract: The adrenal glands are unremarkable. The kidneys are normal in position. Mild bilateral renal cortical atrophy. The kidneys are otherwise unremarkable. The bladder is unremarkable. Stomach/Bowel: Severe sigmoid diverticulosis without superimposed acute inflammatory change. Stomach, small bowel, and large bowel are otherwise unremarkable. Appendix normal. No free intraperitoneal gas or fluid. Vascular/Lymphatic: 3.5 x 2.9 cm infrarenal abdominal aortic aneurysm is present with moderate mixed atherosclerotic plaque. No pathologic adenopathy within the abdomen and pelvis. Reproductive: Uterus and bilateral adnexa are unremarkable. Other: No abdominal wall hernia or abnormality. No abdominopelvic ascites. Musculoskeletal: Osseous structures are age-appropriate. No acute bone abnormality. IMPRESSION: 1. No acute  intrathoracic or intra-abdominal pathology identified. No definite radiographic explanation for the patient's reported symptoms. 2. Extensive multi-vessel coronary artery calcification. 3. Severe sigmoid diverticulosis without superimposed acute inflammatory change. 4. 3.5 cm infrarenal abdominal aortic aneurysm. Recommend follow-up ultrasound every 3 years. (Ref.: J Vasc Surg. 2018; 67:2-77 and J Am Coll Radiol 2013;10(10):789-794.) Aortic Atherosclerosis (ICD10-I70.0) and Emphysema (ICD10-J43.9). Electronically Signed   By: Helyn Numbers M.D.   On: 10/19/2023 02:42     Discharge Instructions: Discharge Instructions  Call MD for:  difficulty breathing, headache or visual disturbances   Complete by: As directed    Call MD for:  extreme fatigue   Complete by: As directed    Call MD for:  hives   Complete by: As directed    Call MD for:  persistant dizziness or light-headedness   Complete by: As directed    Call MD for:  persistant nausea and vomiting   Complete by: As directed    Call MD for:  redness, tenderness, or signs of infection (pain, swelling, redness, odor or green/yellow discharge around incision site)   Complete by: As directed    Call MD for:  severe uncontrolled pain   Complete by: As directed    Call MD for:  temperature >100.4   Complete by: As directed    Diet - low sodium heart healthy   Complete by: As directed    Increase your diet as tolerated   Discharge instructions   Complete by: As directed    You came to the hospital for weight loss, nausea, and vomiting and you were diagnosed with acute gastritis with erosions and ulcers (inflammation of the stomach).  We treated you with Protonix and Carafate.   *For your weight loss from acute gastritis with erosions and ulceration  -We have started you on these following medications:  -Protonix (pantoprazole), please take this medication twice a day   -Carafate (sucralfate), please take three times day, at least 30-60  minutes prior to eating food or taking other medications.             - We have prescribed you additional nutrition supplements    -Please see Gastroenterologist (Stomach Dr) in 2-4 weeks or soonest available appointment  -It is very important that you undergo cancer screening tests such a mammogram, colonoscopy, Lung CT scan. These are important to screen for cancer's early.   *For your Blood pressure -Please do not take amlodipine (Norvasc) until you follow up with your PCP (family doctor's) office     Follow-up appointments: Please visit your family doctor in 7 to 10 days Please visit Gastroenterologist (Stomach Dr) in 2-4 weeks or soonest available appointment   If you have any questions or concerns please feel free to call: Internal medicine clinic at [phone]   If you have any of these following symptoms, please call us or seek care at an emergency department: -Chest Pain -Difficulty Breathing -Worsening abdominal pain -Syncope (passing out) -Drooping of face -Slurred speech -Sudden weakness in your leg or arm -Fever -Chills   We are glad that you are feeling better, it was a pleasure to care for you!  Faith Rogue DO   Discharge wound care:   Complete by: As directed    Apply barrier cream on the buttlock daily and after defecation   Increase activity slowly   Complete by: As directed        Signed: Faith Rogue DO [facility] - PGY1 Pager: [phone] 10/20/2023, 1:16 PM    Please contact the on call pager after 5 pm and on weekends at [phone].

## 2023-10-21 LAB — GLIA (IGA/G) + TTG IGA
Antigliadin Abs, IgA: 3 U (ref 0–19)
Gliadin IgG: 2 U (ref 0–19)
Tissue Transglutaminase Ab, IgA: <2 U/mL (ref 0–3)

## 2023-10-21 NOTE — Anesthesia Postprocedure Evaluation (Signed)
Anesthesia Post Note  Patient: Kim [name]  Procedure(s) Performed: ESOPHAGOGASTRODUODENOSCOPY (EGD) WITH PROPOFOL BIOPSY     Patient location during evaluation: Endoscopy Anesthesia Type: MAC Level of consciousness: awake and alert Pain management: pain level controlled Vital Signs Assessment: post-procedure vital signs reviewed and stable Respiratory status: spontaneous breathing, nonlabored ventilation, respiratory function stable and patient connected to nasal cannula oxygen Cardiovascular status: blood pressure returned to baseline and stable Postop Assessment: no apparent nausea or vomiting Anesthetic complications: no  No notable events documented.  Last Vitals:  Vitals:   10/20/23 1030 10/20/23 1040  BP: (!) 114/58 127/64  Pulse: 60 66  Resp: 16 18  Temp:    SpO2: 99% 97%    Last Pain:  Vitals:   10/20/23 1040  TempSrc:   PainSc: 7    Pain Goal: Patients Stated Pain Goal: 0 (10/19/23 1451)                 Truth Barot L [place] Shew

## 2023-10-23 ENCOUNTER — Telehealth: Payer: Self-pay

## 2023-10-23 ENCOUNTER — Telehealth: Payer: Self-pay | Admitting: Internal Medicine

## 2023-10-23 LAB — SURGICAL PATHOLOGY

## 2023-10-23 MED ORDER — BISMUTH/METRONIDAZ/TETRACYCLIN 140-125-125 MG PO CAPS: 3.0000 | ORAL_CAPSULE | Freq: Four times a day (QID) | ORAL | 0 refills | Status: DC

## 2023-10-23 MED ORDER — TETRACYCLINE HCL 500 MG PO CAPS: 500.0000 mg | ORAL_CAPSULE | Freq: Four times a day (QID) | ORAL | 0 refills | Status: DC

## 2023-10-23 MED ORDER — PANTOPRAZOLE SODIUM 40 MG PO TBEC: 40.0000 mg | DELAYED_RELEASE_TABLET | Freq: Two times a day (BID) | ORAL | 0 refills | Status: DC

## 2023-10-23 MED ORDER — BISMUTH 262 MG PO CHEW: 524.0000 mg | CHEWABLE_TABLET | Freq: Four times a day (QID) | ORAL | 0 refills | Status: DC

## 2023-10-23 MED ORDER — METRONIDAZOLE 500 MG PO TABS: 500.0000 mg | ORAL_TABLET | Freq: Four times a day (QID) | ORAL | 0 refills | Status: DC

## 2023-10-23 MED ORDER — OMEPRAZOLE MAGNESIUM 20 MG PO TBEC: 20.0000 mg | DELAYED_RELEASE_TABLET | Freq: Every day | ORAL | 0 refills | Status: DC

## 2023-10-23 NOTE — Telephone Encounter (Signed)
EGD surgical pathology just resulted, positive for H. Pylori.  Called patient and explained results. I am going to send in quadruple therapy with protonix 40 mg bid (she should be taking this, but states that she did not get it after her hospitalization), bismuth QID, tetracycline 500 mg QID, and flagyl 500 mg QID.  I will also send a message to the front desk to schedule her an appt in 6 weeks (4 weeks after treatment course completed) to confirm eradication.   Elza Rafter, DO Internal Medicine Resident, PGY-3

## 2023-10-23 NOTE — Telephone Encounter (Signed)
Addendum from morning note:  Patient's insurance has denied tetracycline as Pylera capsule is preferred. Will send in Pylera (combo bismuth, metronidazole, and tetracycline) 3 capsules four times daily for 10 days and omeprazole 20 mg bid x 10 days.

## 2023-10-23 NOTE — Progress Notes (Signed)
See separate telephone encounter. Called patient regarding results and sent in quadruple therapy.

## 2023-10-23 NOTE — Addendum Note (Signed)
Addended by: Elza Rafter on: 10/23/2023 01:32 PM   Modules accepted: Orders

## 2023-10-23 NOTE — Telephone Encounter (Signed)
Decision:Denied  YOU ASKED FOR: Service Description Code 1 Code 2 Plan Requested  Dates Requested  Amount OMEPRAZOLE MAGNESIUM Tablet  DR [id number] Medicaid 10/23/2023 10 units WE DENIED: Service Description Code 1 Code 2 Plan Denied Dates Denied  Amount OMEPRAZOLE MAGNESIUM Tablet  DR [id number] Medicaid 10/23/2023 10 units COMMENTS:  Per the health plan's preferred drug list, at least 2 preferred drugs must be tried before requesting this drug  or tell us why the member cannot try any preferred alternatives. Please send [name] supporting chart notes and  lab results. Here is list of preferred alternatives: Dexilant Capsule, esomeprazole magnesium capsule  (generic for Nexium Rx), lansoprazole capsule (generic for Prevacid Rx), Nexium Rx Packet, omeprazole Rx  capsule (generic for Prilosec Rx), pantoprazole tablet (generic for Protonix), Protonix Suspension. Per our records, the member has already tried pantoprazole tablets.

## 2023-10-23 NOTE — Telephone Encounter (Signed)
Decision:Denied YOU ASKED FOR: Service Description Code 1 Code 2 Plan Requested  Dates Requested  Amount TETRACYCLINE HCL Capsule [id number] Medicaid 56 units WE DENIED: Service Description Code 1 Code 2 Plan Denied Dates Denied  Amount TETRACYCLINE HCL Capsule [id number] Medicaid 56 units COMMENTS:  Per the health plan's preferred drug list, at least 1 preferred drugs must be tried before requesting this drug  or tell us why the member cannot try any preferred alternatives. Please send [name] supporting chart notes and  lab results. Here is list of preferred alternatives: Pylera Capsule.

## 2023-10-23 NOTE — Telephone Encounter (Signed)
Prior Authorization for patient (Tetracycline HCl 500MG  capsules) came through on cover my meds was submitted with last office notes awaiting approval or denial.  UJW:[id number])

## 2023-10-23 NOTE — Telephone Encounter (Signed)
Prior Authorization for patient (Omeprazole Magnesium 20MG  dr tablets) came through on cover my meds was submitted with last office notes awaiting approval or denial.  [id number]

## 2023-10-25 ENCOUNTER — Encounter (HOSPITAL_COMMUNITY): Payer: Self-pay | Admitting: Internal Medicine

## 2023-10-30 NOTE — Progress Notes (Signed)
Internal Medicine Clinic Attending  Case discussed with the resident at the time of the visit.  We reviewed the resident's history and exam and pertinent patient test results.  I agree with the assessment, diagnosis, and plan of care documented in the resident's note.  

## 2023-10-31 ENCOUNTER — Telehealth: Payer: Self-pay | Admitting: Student

## 2023-10-31 MED ORDER — OMEPRAZOLE 20 MG PO CPDR: 20.0000 mg | DELAYED_RELEASE_CAPSULE | Freq: Two times a day (BID) | ORAL | 0 refills | Status: DC

## 2023-10-31 NOTE — Telephone Encounter (Signed)
 Received after-hours page.  Returned call to patient and confirmed DOB.  Patient concerned about no significant improvement since hospital discharge.  Patient reports taking only metronidazole  and none of the other medications for recently diagnosed H. pylori.  I called [facility] and confirmed that the Pylera (bismuth , metronidazole  and tetracycline ) and omeprazole  were not dispensed yet.  Pharmacist notes Pylera should be ready for pickup today or tomorrow. Resent omeprazole  Rx today. Returned call to patient to update her that medications for H. pylori should be available for pickup hopefully today. Return precautions provided.  Patient also requests update on electric wheelchair.  Patient has follow-up in early February with [facility] but will message [facility] front office about rescheduling for earlier visit for evaluation. Patient verbalizes understanding of plan and all questions addressed.

## 2023-11-01 DIAGNOSIS — Z419 Encounter for procedure for purposes other than remedying health state, unspecified: Secondary | ICD-10-CM | POA: Diagnosis not present

## 2023-11-08 ENCOUNTER — Telehealth: Payer: Self-pay | Admitting: *Deleted

## 2023-11-08 ENCOUNTER — Telehealth: Payer: Self-pay

## 2023-11-08 NOTE — Telephone Encounter (Signed)
 RTC to patient states does not feel thst Antibiotic is working.  Patient states takes 4 times a day is not getting better.  Patient also stated  has not had a bowel movement in the last 4 days.  Not passing any gas.  Asked if abdomen was hard stated could not tell.  Having some nausea and vomiting.  When asked if she is eating stated does not eat for fear she may through up. No fevers.  When she tries to urinate feels like it will not come out even when she has the urge to go.  No available appointments in the Clinics today or tomorrow.  Advised to go to the ER states did not want to come and have to sit in the ER for long periods of time. Lives in [place] near her. States does not want to have to wait for long periods.

## 2023-11-08 NOTE — Telephone Encounter (Signed)
 Pt is requesting a cal back.. she stated that the antibiotic that she is on is not working it is causing her to not be able to urinate  .Marland Kitchen

## 2023-11-13 NOTE — Progress Notes (Deleted)
  [facility] Initial Consultation   Referring Provider Forest Coy, MD [address] [place],  [place] [address]  Primary Care Provider Tobie Gaines, DO  Patient Profile: Kim [name] is a 63 y.o. female who is seen in consultation in the [facility] at the request of Dr. Guilloud for evaluation and management of the problem(s) noted below.  Problem List: Dyspepsia   History of Present Illness   Kim [name] is a 63 y.o. female with a history of ***.   Last colonoscopy: *** Last endoscopy: ***  Last Abd CT/CTE/MRE: ***  GI Review of Symptoms Significant for {GIROS:50592}. Otherwise negative.  General Review of Systems  Review of systems is significant for the pertinent positives and negatives as listed per the HPI.  Full ROS is otherwise negative.  Past Medical History  @PMHCOLLAPSE @   Past Surgical History  @PSHCOLLAPSE @   Allergies and Medications  No Known Allergies  @MEDSTODAY @  Family History  @FAMHXCOLLAPSE @ GI Specific Family History: {gifamhx:50061}   Social History   Social History   Social History Narrative   Not on file   Kim [name] reports that she has been smoking cigarettes. She has never used smokeless tobacco. She reports that she does not currently use alcohol. She reports that she does not currently use drugs.  Vital Signs and Physical Examination  There were no vitals filed for this visit. There is no height or weight on file to calculate BMI.    Constitutional: {Blank Single:19197::healthy,thin,chronically ill,chronically ill and cachectic,well-nourished} appearing nontoxic appearing 63 y.o. female in no apparent distress.   Eyes: {Eyes:44023} Head: {Head:44024:x} Neck: {Neck:44025:x} Pulmonary: {Chest:44026} CV: {Heart:44027:x} GI/Abdomen: {BS:44033}, {Soft:44034}, {Distention:44035}, {Pain:44036}, {HSM:44037:x}, {Masses:44038}, {Other (Optional):44039} Anorectal: {Rectal:44028:x} Neuro:  {Neuro:44031:x} Skin: {Skin:44029:x} Bilateral Extremities: {Ext:44032:x}   Review of Data  The following data was reviewed at the time of this encounter:  Laboratory Studies  @LABTABLE36M (wbc:5,hgb:5,hct:5,mcv:5,plt:5)@ @LABTABLE36M (NA,K,CL,co2,bun,creatinine,labglom,glucose,calcium :3)@ @LABTABLE12M (ast:7,alt:7,tbili:7,alkphos:7,alb:7)@ @LABTABLE36M (iron:2,tibc:2,pctsat:2,ferritin:2,folate:2,vitb12:2,vitd:2)@ @LABTABLE36M (esr:3,crp:3)@ @LABTABLE36M (amylase:3,lipase:5)@ Lab Results  Component Value Date   TSH 5.762 (H) 10/19/2023     Imaging Studies  CT chest abdomen pelvis 10-24-23 1. No acute intrathoracic or intra-abdominal pathology identified. No definite radiographic explanation for the patient's reported symptoms. 2. Extensive multi-vessel coronary artery calcification. 3. Severe sigmoid diverticulosis without superimposed acute inflammatory change. 4. 3.5 cm infrarenal abdominal aortic aneurysm. Recommend follow-up ultrasound every 3 years. (Ref.: J Vasc Surg. 2018; 67:2-77 and J Am Coll Radiol 2013;10(10):789-794.)     GI Procedures and Studies      Clinical Impression  It is my clinical impression that Kim [name] is a 64 y.o. female with;  Dyspepsia  Plan  *** *** *** *** ***  Planned Follow Up No follow-ups on file.  The patient or caregiver verbalized understanding of the material covered, with no barriers to understanding. All questions were answered. Patient or caregiver is agreeable with the plan outlined above.    It was a pleasure to see Kim [name].  If you have any questions or concerns regarding this evaluation, do not hesitate to contact me.  Inocente Hausen, MD [facility]

## 2023-11-14 ENCOUNTER — Ambulatory Visit: Payer: Medicaid Other | Admitting: Pediatrics

## 2023-11-22 ENCOUNTER — Ambulatory Visit: Payer: Medicaid Other | Admitting: Student

## 2023-11-22 ENCOUNTER — Encounter: Payer: Self-pay | Admitting: Student

## 2023-11-22 VITALS — BP 103/57 | HR 100 | Temp 97.7°F | Ht 65.0 in | Wt 170.3 lb

## 2023-11-22 DIAGNOSIS — I10 Essential (primary) hypertension: Secondary | ICD-10-CM

## 2023-11-22 DIAGNOSIS — B9681 Helicobacter pylori [H. pylori] as the cause of diseases classified elsewhere: Secondary | ICD-10-CM | POA: Insufficient documentation

## 2023-11-22 DIAGNOSIS — R69 Illness, unspecified: Secondary | ICD-10-CM | POA: Insufficient documentation

## 2023-11-22 DIAGNOSIS — K2970 Gastritis, unspecified, without bleeding: Secondary | ICD-10-CM | POA: Diagnosis not present

## 2023-11-22 DIAGNOSIS — Z1231 Encounter for screening mammogram for malignant neoplasm of breast: Secondary | ICD-10-CM | POA: Insufficient documentation

## 2023-11-22 DIAGNOSIS — Z1211 Encounter for screening for malignant neoplasm of colon: Secondary | ICD-10-CM | POA: Insufficient documentation

## 2023-11-22 NOTE — Assessment & Plan Note (Signed)
Ordered mammogram for patient

## 2023-11-22 NOTE — Assessment & Plan Note (Signed)
Patient was recently discharged from the hospital, and due to hypotensive episodes patient was stopped on her amlodipine.  She however reports that she is still taking this.  Blood pressure today soft at 103/57.  She does feel weak, which could likely be from her hypotension.  Plan: -Encourage patient to stop taking her amlodipine for now

## 2023-11-22 NOTE — Progress Notes (Signed)
CC: Hospital Follow up  HPI:  [name] is a 63 y.o. female with a past medical history of hypertension, chronic respiratory failure, OSA who presents for wheelchair request and discussed previous hospitalization.  Patient was recently hospitalized from 12/18 to 12/20 for concerns of peptic ulcer disease.  During hospitalization, patient had endoscopy which showed acute ulcerative and erosive gastritis.  Patient was discharged on PPI for 8 weeks.  Patient also has subclinical hypothyroidism.  Ultimately, patient did have results of EGD showing positive H. pylori.  Patient was started on bismuth, tetracycline, and Flagyl.    Medication: Chronic respiratory failure secondary to COPD: Ventolin, Incruse Ellipta H.pylori: Pylera should be finished, Carafate GERD: Protonix 40 mg twice daily HLD: Pravastatin 20 mg daily  Past Medical History:  Diagnosis Date   COPD (chronic obstructive pulmonary disease) (HCC)    High cholesterol    Hypertension    Smoker      Current Outpatient Medications:    acetaminophen (TYLENOL) 500 MG tablet, Take 500 mg by mouth 3 (three) times daily as needed (pain)., Disp: , Rfl:    albuterol (VENTOLIN HFA) 108 (90 Base) MCG/ACT inhaler, Inhale 2 puffs into the lungs every 4 (four) hours as needed for wheezing or shortness of breath., Disp: 18 g, Rfl: 2   [Paused] amLODipine (NORVASC) 5 MG tablet, Take 1 tablet (5 mg total) by mouth daily., Disp: 30 tablet, Rfl: 1   Bismuth/Metronidaz/Tetracyclin (PYLERA) 140-125-125 MG CAPS, Take 3 capsules by mouth 4 (four) times daily for 10 days., Disp: 120 capsule, Rfl: 0   Nutritional Supplements (ENSURE COMPLETE SHAKE) LIQD, Take 1 each by mouth in the morning and at bedtime., Disp: 237 mL, Rfl: 2   omeprazole (PRILOSEC) 20 MG capsule, Take 1 capsule (20 mg total) by mouth 2 (two) times daily before a meal for 10 days., Disp: 20 capsule, Rfl: 0   pravastatin (PRAVACHOL) 20 MG tablet, TAKE 1 TABLET BY MOUTH EVERYDAY  AT BEDTIME, Disp: 90 tablet, Rfl: 0   senna-docusate (SENOKOT-S) 8.6-50 MG tablet, Take 1 tablet by mouth at bedtime as needed for mild constipation., Disp: 2 tablet, Rfl: 0   sucralfate (CARAFATE) 1 GM/10ML suspension, Take 10 mLs (1 g total) by mouth 4 (four) times daily -  with meals and at bedtime., Disp: 473 mL, Rfl: 0   triamcinolone (KENALOG) 0.025 % ointment, Apply 1 Application topically 2 (two) times daily as needed (Itching)., Disp: 30 g, Rfl: 0   umeclidinium bromide (INCRUSE ELLIPTA) 62.5 MCG/ACT AEPB, Inhale 1 puff into the lungs daily., Disp: 30 each, Rfl: 11  Review of Systems:    GI: Patient endorse abdominal pain  Physical Exam:  Vitals:   11/22/23 1552 11/22/23 1616  BP: (!) 103/57   Pulse: 100   Temp: 97.7 F (36.5 C)   TempSrc: Oral   SpO2: (!) 89% 99%  Weight: 170 lb 4.8 oz (77.2 kg)   Height: 5\' 5"  (1.651 m)    General: Patient is sitting comfortably in the room, chronically ill appearing  Cardio: Regular rate and rhythm, no murmurs, rubs or gallops Pulmonary: Diffused wheezing noted with decreased breath sounds appreciated to the bilaterally lung fields  Abdomen: Soft, nontender, no rebound or guarding    Assessment & Plan:   Essential hypertension Patient was recently discharged from the hospital, and due to hypotensive episodes patient was stopped on her amlodipine.  She however reports that she is still taking this.  Blood pressure today soft at 103/57.  She  does feel weak, which could likely be from her hypotension.  Plan: -Encourage patient to stop taking her amlodipine for now  Helicobacter pylori gastritis Patient recently diagnosed with H. pylori gastritis causing ulcerative and erosive gastritis.  Patient was started on Pylera.  She is unclear if she has finished it or not, but she should have finished by now.  She does not know which medication this was.  She reports having burning sensation still.  She denies any melena or bloody stools.  Given  that she is still on PPI for the next 4 weeks, cannot do test for eradication at this point.  She does have follow-up with GI in 8 days.  Will continue to monitor.  Plan: -Continue Protonix 40 mg twice daily for the next 4 weeks -Continue Carafate -Follow-up with GI in 8 days -Patient to follow-up with me on December 04, 2023  Colon cancer screening Patient referred for colonoscopy.  Encounter for screening mammogram for malignant neoplasm of breast Ordered mammogram for patient.  Taking multiple medications for chronic disease Patient takes many medications for chronic diseases.  On medication review today, patient is unclear which medication she is taking.  She is unsure what medication is for what disease.  She is unclear what doses she is on.  Unclear if patient does have some mild cognitive impairment or not.  Unable to evaluate today given time limitations during encounter.  I do recommend full medication review with home health.  Plan: -Referral for home health for medication management   Patient discussed with Dr. Letta Kocher, DO PGY-2 Internal Medicine Resident  Pager: [phone]

## 2023-11-22 NOTE — Assessment & Plan Note (Signed)
Patient takes many medications for chronic diseases.  On medication review today, patient is unclear which medication she is taking.  She is unsure what medication is for what disease.  She is unclear what doses she is on.  Unclear if patient does have some mild cognitive impairment or not.  Unable to evaluate today given time limitations during encounter.  I do recommend full medication review with home health.  Plan: -Referral for home health for medication management

## 2023-11-22 NOTE — Assessment & Plan Note (Signed)
Patient recently diagnosed with H. pylori gastritis causing ulcerative and erosive gastritis.  Patient was started on Pylera.  She is unclear if she has finished it or not, but she should have finished by now.  She does not know which medication this was.  She reports having burning sensation still.  She denies any melena or bloody stools.  Given that she is still on PPI for the next 4 weeks, cannot do test for eradication at this point.  She does have follow-up with GI in 8 days.  Will continue to monitor.  Plan: -Continue Protonix 40 mg twice daily for the next 4 weeks -Continue Carafate -Follow-up with GI in 8 days -Patient to follow-up with me on December 04, 2023

## 2023-11-22 NOTE — Assessment & Plan Note (Signed)
Patient referred for colonoscopy

## 2023-11-22 NOTE — Patient Instructions (Addendum)
Kim [name],Thank you for allowing me to take part in your care today.  Here are your instructions.  1.  Regarding your abdominal pain and your H. pylori, continue taking your Protonix 40 mg twice daily.  Do not miss any doses.  2.  Please come back to visit me on February 3. We can do your PAP at that time   3.  I have referred you for mammogram.  They will call you to schedule  4.  I have referred you for colonoscopy.  They will call you to schedule  5.  Go to your stomach doctors appointment.  Thank you, Dr. Allena Katz  If you have any other questions please contact the internal medicine clinic at [phone] If it is after hours, please call the [facility] at [phone] and then ask the person who picks up for the resident on call.

## 2023-11-23 NOTE — Progress Notes (Signed)
 Internal Medicine Clinic Attending  Case discussed with the resident physician at the time of the visit.  We reviewed the patient's history, exam, and pertinent patient test results.  I agree with the assessment, diagnosis, and plan of care documented in the resident's note.

## 2023-11-27 ENCOUNTER — Telehealth: Payer: Self-pay | Admitting: *Deleted

## 2023-11-27 NOTE — Telephone Encounter (Signed)
Patient was called and informed that she will need to be seen before she can get a prescription to replace the Gabapentin.  Has 1 scheduled for 12/04/2023 at 2:45 PM.  Also encouraged not to take anymore of the Gabapentin.  Will call patient if their are any Cancellations.

## 2023-11-27 NOTE — Progress Notes (Addendum)
Chief Complaint: colon cancer screening Primary GI Doctor: Dr. Rhea Belton   HPI: Patient is a 63 year old female patient with past medical history of obesity, hypertension, hyperlipidemia, infrarenal AAA, chronic tobacco use, chronic respiratory failure on home oxygen 3L [place], COPD, OSA, CKD stage II and polyneuropathy, who was referred to me by Dr. Reymundo Poll on 11/22/23 for colon cancer screening.  09/07/2023 patient presented to PCP when main complaint of nausea, vomiting,and loss of appetite for the past month. She reports 40lb weight loss in 6 mths. Pt provided ondansetron for nausea and protonix 40mg  po daily. Labwork ordered. GI referral placed for dyspepsia.  10/11/2023 patient seen by PCP for follow-up regarding intractable nausea, vomiting and weight loss. PCP recommended hospital admission, however, the patient declined.  A stat CTAP was ordered but was not completed. She was started on Mirtazapine 15 mg nightly to stimulate her appetite. A GI referral was provided.  10/18/2023 patient seen by PCP for follow-up and at that time she reported not eating any solid food x 3 days. She was directly admitted to [facility] for further evaluation. WBC count of 11.8.  Hemoglobin 16.4.  Platelets 223.  Sodium 132.  Potassium 3.1.  Glucose 82.  BUN 10.  Creatinine 1.13.  Calcium 8.8.  Albumin 2.7.  GFR 55.  Acute hepatitis panel negative.  TSH 5.762.  AM cortisol level 21.6.  ACTH base 14.5, cortisol at 30 minutes 20.9 and cortisol at 60 minutes 22.5.  HIV nonreactive. Chest/abdominal/pelvic CT with contrast without acute intrathoracic or intraabdominal/pelvic pathology to explain her symptoms, showed extensive multivessel coronary artery calcification, a 3.5 cm infrarenal abdominal aortic aneurysm and severe sigmoid diverticulosis without evidence of diverticulitis.   A GI consult was requested for further evaluation regarding intractable N/V and weight loss.  Recommendations for EGD, hold off  on colonoscopy outpatient due to current upper GI symptoms, inability to tolerate colon prep.  10/20/23 EGD with Dr. Rhea Belton -  Normal esophagus. 2 cm Hiatal Hernia. Acute, ulcerative and erosive gastritis. Biopsied to exclude H. Pylori. Normal examined duodenum. Path:  FINAL MICROSCOPIC DIAGNOSIS:  A. STOMACH, BIOPSY:  Marked chronic active gastritis with lymphoid aggregates  Helicobacter present  Negative for intestinal metaplasia, dysplasia and carcinoma  Recommendations:  BID PPI recommended 8 weeks, and perhaps daily thereafter.  No NSAIDs 10/23/23 Biopsy positive for H. Pylori and PCP started patient on Quadruple therapy.  Interval History    Patient presents for follow-up after recent hospitalization, accompanied by her boyfriend. She reports she is not feeling well, came up in wheelchair. She is still having nausea, vomiting, and poor appetite. Patient has ondansetron at home but admits she has not been taking it regularly, reports it does not help much. She reports she has only had water and juice the last 6 days. She reports if she eats solid food she throws it back up. Patient given 10 days of Pylera for H pylori at discharge and reports she took 9 of the 10 day regimen. Some of the medication she has threw back up. Boyfriend reports she has been taking the carafate. She reports her last bowel movement was approximately 7 days ago. Denies abdominal pain or blood in stool. No known family history of esophageal, gastric, colon, liver or pancreatic cancer. Father had brain cancer. She smokes 1/2 to 1 pack of cigarettes daily for the past 50 years. No alcohol or drug use. No NSAID use.   Wt Readings from Last 3 Encounters:  11/30/23 170 lb (  77.1 kg)  11/22/23 170 lb 4.8 oz (77.2 kg)  10/20/23 88 lb 10 oz (40.2 kg)    Past Medical History:  Diagnosis Date   COPD (chronic obstructive pulmonary disease) (HCC)    H. pylori infection    High cholesterol    Hypertension    Smoker     Past Surgical History:  Procedure Laterality Date   BIOPSY  10/20/2023   Procedure: BIOPSY;  Surgeon: Beverley Fiedler, MD;  Location: [facility] ENDOSCOPY;  Service: Gastroenterology;;   ESOPHAGOGASTRODUODENOSCOPY (EGD) WITH PROPOFOL N/A 10/20/2023   Procedure: ESOPHAGOGASTRODUODENOSCOPY (EGD) WITH PROPOFOL;  Surgeon: Beverley Fiedler, MD;  Location: [facility] ENDOSCOPY;  Service: Gastroenterology;  Laterality: N/A;   SALPINGECTOMY N/A    Current Outpatient Medications  Medication Sig Dispense Refill   acetaminophen (TYLENOL) 500 MG tablet Take 500 mg by mouth 3 (three) times daily as needed (pain).     albuterol (VENTOLIN HFA) 108 (90 Base) MCG/ACT inhaler Inhale 2 puffs into the lungs every 4 (four) hours as needed for wheezing or shortness of breath. 18 g 2   senna-docusate (SENOKOT-S) 8.6-50 MG tablet Take 1 tablet by mouth at bedtime as needed for mild constipation. 2 tablet 0   sucralfate (CARAFATE) 1 GM/10ML suspension Take 10 mLs (1 g total) by mouth 4 (four) times daily -  with meals and at bedtime. 473 mL 0   triamcinolone (KENALOG) 0.025 % ointment Apply 1 Application topically 2 (two) times daily as needed (Itching). 30 g 0   umeclidinium bromide (INCRUSE ELLIPTA) 62.5 MCG/ACT AEPB Inhale 1 puff into the lungs daily. 30 each 11   [Paused] amLODipine (NORVASC) 5 MG tablet Take 1 tablet (5 mg total) by mouth daily. (Patient not taking: Reported on 11/30/2023) 30 tablet 1   Bismuth/Metronidaz/Tetracyclin (PYLERA) 140-125-125 MG CAPS Take 3 capsules by mouth 4 (four) times daily for 10 days. 120 capsule 0   Nutritional Supplements (ENSURE COMPLETE SHAKE) LIQD Take 1 each by mouth in the morning and at bedtime. (Patient not taking: Reported on 11/30/2023) 237 mL 2   omeprazole (PRILOSEC) 20 MG capsule Take 1 capsule (20 mg total) by mouth 2 (two) times daily before a meal for 10 days. 20 capsule 0   pravastatin (PRAVACHOL) 20 MG tablet TAKE 1 TABLET BY MOUTH EVERYDAY AT BEDTIME 90 tablet 0   No current  facility-administered medications for this visit.   Allergies as of 11/30/2023   (No Known Allergies)    Family History  Problem Relation Age of Onset   CAD Mother    Colon cancer Neg Hx    Esophageal cancer Neg Hx     Review of Systems:    Constitutional: No weight loss, fever, chills, weakness or fatigue HEENT: Eyes: No change in vision               Ears, Nose, Throat:  No change in hearing or congestion Skin: No rash or itching Cardiovascular: No chest pain, chest pressure or palpitations   Respiratory: No SOB or cough Gastrointestinal: See HPI and otherwise negative Genitourinary: No dysuria or change in urinary frequency Neurological: No headache, dizziness or syncope Musculoskeletal: No new muscle or joint pain Hematologic: No bleeding or bruising Psychiatric: No history of depression or anxiety    Physical Exam:  Vital signs: BP 100/60   Pulse 84   Ht 5\' 5"  (1.651 m)   Wt 170 lb (77.1 kg) Comment: wt last week, unable to weigh here today  BMI 28.29 kg/m   Constitutional:  Ill and frail appearing caucasian female appears to be not feeling well Throat: Oral cavity and pharynx without inflammation, swelling or lesion.  Respiratory: Respirations even and unlabored. Diminished lung sounds bilaterally.   No wheezes, crackles, or rhonchi.  Cardiovascular: Normal S1, S2. Regular rate and rhythm. No peripheral edema, cyanosis or pallor.  Gastrointestinal:  Soft, nondistended, nontender. Obese. No rebound or guarding. Hypoactive bowel sounds. No appreciable masses or hepatomegaly. Rectal:  Not performed.  Msk: Appears in wheelchair, admits to overall weakness and fatigue. Neurologic:  Alert and  oriented x4;  grossly normal neurologically.  Skin:   Dry and intact without significant lesions or rashes. Psychiatric: Oriented to person, place and time. Demonstrates good judgement and reason without abnormal affect or behaviors.  RELEVANT LABS AND IMAGING: CBC    Latest  Ref Rng & Units 10/20/2023    5:23 AM 10/19/2023    4:51 AM 10/11/2023    2:26 PM  CBC  WBC 4.0 - 10.5 K/uL 10.6  11.8  10.6   Hemoglobin 12.0 - 15.0 g/dL 16.1  09.6  04.5   Hematocrit 36.0 - 46.0 % 45.4  48.5  54.2   Platelets 150 - 400 K/uL 203  223  210     CMP     Latest Ref Rng & Units 10/20/2023    7:47 AM 10/19/2023    4:51 AM 10/11/2023    2:26 PM  CMP  Glucose 70 - 99 mg/dL 88  82  99   BUN 8 - 23 mg/dL 9  10  10    Creatinine 0.44 - 1.00 mg/dL 4.09  8.11  9.14   Sodium 135 - 145 mmol/L 134  132  133   Potassium 3.5 - 5.1 mmol/L 3.9  3.1  3.9   Chloride 98 - 111 mmol/L 94  95  94   CO2 22 - 32 mmol/L 32  29  29   Calcium 8.9 - 10.3 mg/dL 9.0  8.8  9.5   Total Protein 6.5 - 8.1 g/dL   7.3   Total Bilirubin <1.2 mg/dL   1.3   Alkaline Phos 38 - 126 U/L   106   AST 15 - 41 U/L   19   ALT 0 - 44 U/L   10    Lab Results  Component Value Date   TSH 5.762 (H) 10/19/2023  05/04/22- echo- Left ventricular ejection fraction, by estimation, is 60 to 65% 10/18/23 CT chest/abd/pelvis withy contrast IMPRESSION: 1. No acute intrathoracic or intra-abdominal pathology identified. No definite radiographic explanation for the patient's reported symptoms. 2. Extensive multi-vessel coronary artery calcification. 3. Severe sigmoid diverticulosis without superimposed acute inflammatory change. 4. 3.5 cm infrarenal abdominal aortic aneurysm. Recommend follow-up ultrasound every 3 years. Assessment: Encounter Diagnoses  Name Primary?   Nausea and vomiting, unspecified vomiting type Yes   Poor appetite    Helicobacter pylori (H. pylori) infection    Loss of weight        63 year old female patient who was recently diagnosed with h.pylori and treated with Pylera treatment. Unfortunately due to nausea, vomiting, and poor appetite she had issues with completing the full 10 days. Presents today with no improvement in nausea and vomiting and unable to keep any solid foods down. Will order  stat lab work to review her kidney function and electrolytes, anticipate I Dealie Koelzer need to send her to the ED for IV hydration. She will take the ondansetron 4mg  ODT scheduled every 6 hours. At this point I  am unsure how much of the medication she received for the H pylori, she is unable to tolerate any oral antibiotics at this time. Will recheck for eradication in 6 weeks.   Plan: -STAT CBC, CMP, TSH -Continue Omeprazole 20 mg po daily -Scheduled Ondansetron 4 mg every 6 hours. Compazine not covered.  - Recommend Protein shakes three times daily. -Eradication test for H pylori end of February, beginning of March -Hold off on colonoscopy for now. She cannot tolerate prep. -Educated patient and boyfriend if the nausea and vomiting does not improve with scheduled ondansetron and she does not take in increased oral intake she needs to go to ER.  Addendum: Labwork resulted and discussed with Dr. Rhea Belton. Potassium level of 3.1. Will give patient one time dose of KDUR . BUN/Creat normal. TSH normal. No anemia. Will increase Ondansetron to 8 mg every 8 hours and encourage plenty of fluids. If doesn't control nausea can consider Reglan 5 mg 3 times daily before meals and at bedtime.   Thank you for the courtesy of this consult. Please call me with any questions or concerns.   Alara Daniel, FNP-C [facility] 11/30/2023, 11:51 AM  Cc: Modena Slater, DO

## 2023-11-27 NOTE — Telephone Encounter (Signed)
Needs appointment

## 2023-11-27 NOTE — Telephone Encounter (Signed)
Call from patient states that the Gabapentin is giving her double vision.   In looking at chart-patient is not on Gabapentin.  Patient was told to hold and will send message to doctor.  Would like to get something for her Neuropathy.

## 2023-11-30 ENCOUNTER — Other Ambulatory Visit: Payer: Medicaid Other

## 2023-11-30 ENCOUNTER — Encounter: Payer: Self-pay | Admitting: Gastroenterology

## 2023-11-30 ENCOUNTER — Other Ambulatory Visit: Payer: Self-pay | Admitting: Gastroenterology

## 2023-11-30 ENCOUNTER — Ambulatory Visit (INDEPENDENT_AMBULATORY_CARE_PROVIDER_SITE_OTHER): Payer: Medicaid Other | Admitting: Gastroenterology

## 2023-11-30 VITALS — BP 100/60 | HR 84 | Ht 65.0 in | Wt 170.0 lb

## 2023-11-30 DIAGNOSIS — A048 Other specified bacterial intestinal infections: Secondary | ICD-10-CM

## 2023-11-30 DIAGNOSIS — R112 Nausea with vomiting, unspecified: Secondary | ICD-10-CM

## 2023-11-30 DIAGNOSIS — R634 Abnormal weight loss: Secondary | ICD-10-CM | POA: Diagnosis not present

## 2023-11-30 DIAGNOSIS — R630 Anorexia: Secondary | ICD-10-CM

## 2023-11-30 DIAGNOSIS — B9681 Helicobacter pylori [H. pylori] as the cause of diseases classified elsewhere: Secondary | ICD-10-CM | POA: Diagnosis not present

## 2023-11-30 DIAGNOSIS — E876 Hypokalemia: Secondary | ICD-10-CM

## 2023-11-30 LAB — CBC WITH DIFFERENTIAL/PLATELET
Basophils Absolute: 0.1 10*3/uL (ref 0.0–0.1)
Basophils Relative: 0.5 % (ref 0.0–3.0)
Eosinophils Absolute: 0.1 10*3/uL (ref 0.0–0.7)
Eosinophils Relative: 0.4 % (ref 0.0–5.0)
HCT: 47.7 % — ABNORMAL HIGH (ref 36.0–46.0)
Hemoglobin: 16.1 g/dL — ABNORMAL HIGH (ref 12.0–15.0)
Lymphocytes Relative: 14.9 % (ref 12.0–46.0)
Lymphs Abs: 2.3 10*3/uL (ref 0.7–4.0)
MCHC: 33.7 g/dL (ref 30.0–36.0)
MCV: 105.8 fL — ABNORMAL HIGH (ref 78.0–100.0)
Monocytes Absolute: 0.8 10*3/uL (ref 0.1–1.0)
Monocytes Relative: 5.2 % (ref 3.0–12.0)
Neutro Abs: 12.4 10*3/uL — ABNORMAL HIGH (ref 1.4–7.7)
Neutrophils Relative %: 79 % — ABNORMAL HIGH (ref 43.0–77.0)
Platelets: 249 10*3/uL (ref 150.0–400.0)
RBC: 4.5 Mil/uL (ref 3.87–5.11)
RDW: 17 % — ABNORMAL HIGH (ref 11.5–15.5)
WBC: 15.7 10*3/uL — ABNORMAL HIGH (ref 4.0–10.5)

## 2023-11-30 LAB — TSH: TSH: 4.19 u[IU]/mL (ref 0.35–5.50)

## 2023-11-30 LAB — COMPREHENSIVE METABOLIC PANEL
ALT: 7 U/L (ref 0–35)
AST: 19 U/L (ref 0–37)
Albumin: 3.7 g/dL (ref 3.5–5.2)
Alkaline Phosphatase: 103 U/L (ref 39–117)
BUN: 15 mg/dL (ref 6–23)
CO2: 28 meq/L (ref 19–32)
Calcium: 9.9 mg/dL (ref 8.4–10.5)
Chloride: 86 meq/L — ABNORMAL LOW (ref 96–112)
Creatinine, Ser: 1.08 mg/dL (ref 0.40–1.20)
GFR: 54.79 mL/min — ABNORMAL LOW (ref >60.00)
Glucose, Bld: 75 mg/dL (ref 70–99)
Potassium: 3.1 meq/L — ABNORMAL LOW (ref 3.5–5.1)
Sodium: 131 meq/L — ABNORMAL LOW (ref 135–145)
Total Bilirubin: 0.8 mg/dL (ref 0.2–1.2)
Total Protein: 7.4 g/dL (ref 6.0–8.3)

## 2023-11-30 MED ORDER — ONDANSETRON 4 MG PO TBDP: 4.0000 mg | ORAL_TABLET | Freq: Four times a day (QID) | ORAL | 1 refills | Status: AC

## 2023-11-30 MED ORDER — POTASSIUM CHLORIDE CRYS ER 20 MEQ PO TBCR: 40.0000 meq | EXTENDED_RELEASE_TABLET | Freq: Once | ORAL | 0 refills | Status: DC

## 2023-11-30 NOTE — Progress Notes (Signed)
Low potassium level, discussed with Dr. Rhea Belton will sent one time dose of potassium 

## 2023-11-30 NOTE — Patient Instructions (Signed)
_______________________________________________________  If your blood pressure at your visit was 140/90 or greater, please contact your primary care physician to follow up on this.  _______________________________________________________  If you are age 64 or older, your body mass index should be between 23-30. Your Body mass index is 28.29 kg/m. If this is out of the aforementioned range listed, please consider follow up with your Primary Care Provider.  If you are age 53 or younger, your body mass index should be between 19-25. Your Body mass index is 28.29 kg/m. If this is out of the aformentioned range listed, please consider follow up with your Primary Care Provider.   ________________________________________________________  The [facility] providers would like to encourage you to use [facility] to communicate with providers for non-urgent requests or questions.  Due to long hold times on the telephone, sending your provider a message by [facility] may be a faster and more efficient way to get a response.  Please allow 48 business hours for a response.  Please remember that this is for non-urgent requests.  _______________________________________________________  Your provider has requested that you go to the basement level for lab work before leaving today. Press "B" on the elevator. The lab is located at the first door on the left as you exit the elevator.  We have sent the following medications to your pharmacy for you to pick up at your convenience: Zofran ODT place under your tongue every 6 hours as scheduled  Do protein shakes 3 times a day  The stool specimen that was given to you please complete at the end of February and return it to our lab within 2 days of collection and make sure to put the collection time and date on paperwork that is inserted on the form  Your provider has ordered "[employer]" stool testing for you. You have received a kit from our office today containing all  necessary supplies to complete this test. Please carefully read the stool collection instructions provided in the kit before opening the accompanying materials. In addition, be sure to place the label from the top right corner of the laboratory request sheet onto the "puritan opti-swab" tube that is supplied in the kit. This label should include your full name and date of birth. After completing the test, you should secure the purtian tube into the specimen biohazard bag. The laboratory request information sheet (including date and time of specimen collection) should be placed into the outside pocket of the specimen biohazard bag and returned to the [place] lab with 2 days of collection.   If the laboratory information sheet specimen date and time are not filled out, the test will NOT be performed.  It was a pleasure to see you today!  Thank you for trusting me with your gastrointestinal care!

## 2023-12-01 ENCOUNTER — Encounter: Payer: Self-pay | Admitting: Podiatry

## 2023-12-01 ENCOUNTER — Ambulatory Visit (INDEPENDENT_AMBULATORY_CARE_PROVIDER_SITE_OTHER): Payer: Medicaid Other | Admitting: Podiatry

## 2023-12-01 DIAGNOSIS — G629 Polyneuropathy, unspecified: Secondary | ICD-10-CM | POA: Diagnosis not present

## 2023-12-01 NOTE — Progress Notes (Signed)
Subjective:   Patient ID: Kim [name], female   DOB: 63 y.o.   MRN: [id number]   HPI Patient presents stating that she has had a lot of pain in both her feet and her hands and that she has been taking Tylenol for pain and she presents with caregiver.  Patient smokes a pack of cigarettes per day and is not active   Review of Systems  All other systems reviewed and are negative.       Objective:  Physical Exam Vitals and nursing note reviewed.  Constitutional:      Appearance: She is well-developed.  Pulmonary:     Effort: Pulmonary effort is normal.  Musculoskeletal:        General: Normal range of motion.  Skin:    General: Skin is warm.  Neurological:     Mental Status: She is alert.     Vascular status was found to be intact with the patient having neurological deficit and generalized discomfort in the feet but appears to be more related to probability of some type of back compression or other pathology     Assessment:  Probability for some form of neuropathy that is difficult to say with no other indications of severe foot pathology with the patient noted to have poor generalized health     Plan:  H&P reviewed and we discussed the difficulty of this condition and I do not see anything that I can offer from a treatment perspective and I have recommended she see a neurologist for consideration of treatment.  At this point we did discuss the gabapentin which caused double vision and I do not recommend taking that again and unfortunately I do not see any other treatments that I can initiate at this point as I think it is more of a systemic issue.

## 2023-12-02 DIAGNOSIS — Z419 Encounter for procedure for purposes other than remedying health state, unspecified: Secondary | ICD-10-CM | POA: Diagnosis not present

## 2023-12-03 NOTE — Progress Notes (Unsigned)
CC: Intractable nausea and vomiting  HPI:  [name] is a 63 y.o. female with a past medical history of hypertension, chronic respiratory failure, OSA who presents for wheelchair request and discussed previous hospitalization.  Patient was recently hospitalized from 12/18 to 12/20 for concerns of peptic ulcer disease.  During hospitalization, patient had endoscopy which showed acute ulcerative and erosive gastritis.  Patient was discharged on PPI for 8 weeks.  Patient also has subclinical hypothyroidism.  Ultimately, patient did have results of EGD showing positive H. pylori.  Patient was started on bismuth, tetracycline, and Flagyl.     Medication: Chronic respiratory failure secondary to COPD: Ventolin, Incruse Ellipta H.pylori: Pylera should be finished, Carafate GERD: Protonix 40 mg twice daily HLD: Pravastatin 20 mg daily   Past Medical History:  Diagnosis Date   COPD (chronic obstructive pulmonary disease) (HCC)    H. pylori infection    High cholesterol    Hypertension    Smoker      Current Outpatient Medications:    acetaminophen (TYLENOL) 500 MG tablet, Take 500 mg by mouth 3 (three) times daily as needed (pain)., Disp: , Rfl:    albuterol (VENTOLIN HFA) 108 (90 Base) MCG/ACT inhaler, Inhale 2 puffs into the lungs every 4 (four) hours as needed for wheezing or shortness of breath., Disp: 18 g, Rfl: 2   [Paused] amLODipine (NORVASC) 5 MG tablet, Take 1 tablet (5 mg total) by mouth daily. (Patient not taking: Reported on 12/01/2023), Disp: 30 tablet, Rfl: 1   Bismuth/Metronidaz/Tetracyclin (PYLERA) 140-125-125 MG CAPS, Take 3 capsules by mouth 4 (four) times daily for 10 days., Disp: 120 capsule, Rfl: 0   Nutritional Supplements (ENSURE COMPLETE SHAKE) LIQD, Take 1 each by mouth in the morning and at bedtime., Disp: 237 mL, Rfl: 2   omeprazole (PRILOSEC) 20 MG capsule, Take 1 capsule (20 mg total) by mouth 2 (two) times daily before a meal for 10 days., Disp: 20  capsule, Rfl: 0   ondansetron (ZOFRAN-ODT) 4 MG disintegrating tablet, Take 1 tablet (4 mg total) by mouth every 6 (six) hours., Disp: 120 tablet, Rfl: 1   OXYGEN, Inhale 3 L into the lungs as needed., Disp: , Rfl:    potassium chloride SA (KLOR-CON M) 20 MEQ tablet, Take 2 tablets (40 mEq total) by mouth once for 1 dose., Disp: 2 tablet, Rfl: 0   pravastatin (PRAVACHOL) 20 MG tablet, TAKE 1 TABLET BY MOUTH EVERYDAY AT BEDTIME, Disp: 90 tablet, Rfl: 0   senna-docusate (SENOKOT-S) 8.6-50 MG tablet, Take 1 tablet by mouth at bedtime as needed for mild constipation., Disp: 2 tablet, Rfl: 0   sucralfate (CARAFATE) 1 GM/10ML suspension, Take 10 mLs (1 g total) by mouth 4 (four) times daily -  with meals and at bedtime., Disp: 473 mL, Rfl: 0   triamcinolone (KENALOG) 0.025 % ointment, Apply 1 Application topically 2 (two) times daily as needed (Itching)., Disp: 30 g, Rfl: 0   umeclidinium bromide (INCRUSE ELLIPTA) 62.5 MCG/ACT AEPB, Inhale 1 puff into the lungs daily., Disp: 30 each, Rfl: 11  Review of Systems:    Patient endorses nausea and vomiting  Physical Exam:  Vitals:   12/04/23 1439  BP: 109/77  Pulse: (!) 108  Temp: (!) 97.5 F (36.4 C)  TempSrc: Oral  SpO2: 96%  Weight: 164 lb (74.4 kg)  Height: 5\' 5"  (1.651 m)    General: Patient is ill-appearing ENT: Dry mucosal appreciated in oropharynx Cardio: Tachycardic rate, regular rhythm, no murmurs Pulmonary: Clear  to ausculation bilaterally with no rales, rhonchi, and crackles  Abdomen: Soft, nontender, normal active bowel sounds Skin: Decreased skin turgor   Assessment & Plan:   Intractable nausea and vomiting Patient now reports a weeklong history of nausea and vomiting that has not improved.  She has been seen outpatient by me once as well as GI, who tried to keep patient outpatient.  On my initial evaluation, patient was able to keep things down, and now is unable to do so.  She went to go see GI and at that time had  electrolyte derangements with hypokalemia.  Patient was given supplementation, but never took.  She now has not eaten in 8 days.  On exam today patient is tachycardic, and blood pressures are borderline low.  She has decreased skin turgor and a dry oropharynx.  I am concerned about hypovolemia at this point.  Given outpatient management has not helped patient, will need to go to the emergency department for IV hydration.  Will likely also need IV antiemetics.  Differential at this time is vast, but do not think patient has a small bowel obstruction based off my exam today.  Could be related to gastroparesis, acute GI viral illness, peptic ulcer disease related, H. pylori infection related.  Given patient has failed outpatient management, will need to send to emergency department.  Plan: -Sent patient to the emergency department for IV antiemetics and IV hydration -May need admission for further workup   Patient discussed with Dr.  Huntley Estelle, DO PGY-2 Internal Medicine Resident  Pager: [phone]

## 2023-12-04 ENCOUNTER — Emergency Department (HOSPITAL_COMMUNITY)
Admission: EM | Admit: 2023-12-04 | Discharge: 2023-12-04 | Discharge status: Against Medical Advice | Payer: Medicaid Other | Attending: Emergency Medicine | Admitting: Emergency Medicine

## 2023-12-04 ENCOUNTER — Other Ambulatory Visit: Payer: Self-pay

## 2023-12-04 ENCOUNTER — Ambulatory Visit (INDEPENDENT_AMBULATORY_CARE_PROVIDER_SITE_OTHER): Payer: Medicaid Other | Admitting: Student

## 2023-12-04 ENCOUNTER — Encounter (HOSPITAL_COMMUNITY): Payer: Self-pay

## 2023-12-04 ENCOUNTER — Encounter: Payer: Self-pay | Admitting: Student

## 2023-12-04 VITALS — BP 109/77 | HR 108 | Temp 97.5°F | Ht 65.0 in | Wt 164.0 lb

## 2023-12-04 DIAGNOSIS — Z5321 Procedure and treatment not carried out due to patient leaving prior to being seen by health care provider: Secondary | ICD-10-CM | POA: Diagnosis not present

## 2023-12-04 DIAGNOSIS — R112 Nausea with vomiting, unspecified: Secondary | ICD-10-CM | POA: Diagnosis not present

## 2023-12-04 DIAGNOSIS — R109 Unspecified abdominal pain: Secondary | ICD-10-CM | POA: Diagnosis not present

## 2023-12-04 DIAGNOSIS — R1110 Vomiting, unspecified: Secondary | ICD-10-CM | POA: Insufficient documentation

## 2023-12-04 DIAGNOSIS — R630 Anorexia: Secondary | ICD-10-CM | POA: Diagnosis not present

## 2023-12-04 LAB — CBC
HCT: 45.5 % (ref 36.0–46.0)
Hemoglobin: 15.7 g/dL — ABNORMAL HIGH (ref 12.0–15.0)
MCH: 35 pg — ABNORMAL HIGH (ref 26.0–34.0)
MCHC: 34.5 g/dL (ref 30.0–36.0)
MCV: 101.6 fL — ABNORMAL HIGH (ref 80.0–100.0)
Platelets: 216 10*3/uL (ref 150–400)
RBC: 4.48 MIL/uL (ref 3.87–5.11)
RDW: 15 % (ref 11.5–15.5)
WBC: 17.8 10*3/uL — ABNORMAL HIGH (ref 4.0–10.5)
nRBC: 0 % (ref 0.0–0.2)

## 2023-12-04 LAB — COMPREHENSIVE METABOLIC PANEL
ALT: 13 U/L (ref 0–44)
AST: 28 U/L (ref 15–41)
Albumin: 3.3 g/dL — ABNORMAL LOW (ref 3.5–5.0)
Alkaline Phosphatase: 111 U/L (ref 38–126)
Anion gap: 19 — ABNORMAL HIGH (ref 5–15)
BUN: 19 mg/dL (ref 8–23)
CO2: 25 mmol/L (ref 22–32)
Calcium: 9.7 mg/dL (ref 8.9–10.3)
Chloride: 87 mmol/L — ABNORMAL LOW (ref 98–111)
Creatinine, Ser: 1.32 mg/dL — ABNORMAL HIGH (ref 0.44–1.00)
GFR, Estimated: 45 mL/min — ABNORMAL LOW (ref >60)
Glucose, Bld: 75 mg/dL (ref 70–99)
Potassium: 3.4 mmol/L — ABNORMAL LOW (ref 3.5–5.1)
Sodium: 131 mmol/L — ABNORMAL LOW (ref 135–145)
Total Bilirubin: 1.4 mg/dL — ABNORMAL HIGH (ref 0.0–1.2)
Total Protein: 7.3 g/dL (ref 6.5–8.1)

## 2023-12-04 LAB — LIPASE, BLOOD: Lipase: 36 U/L (ref 11–51)

## 2023-12-04 NOTE — ED Triage Notes (Signed)
Pt has not been eating or drinking the last 8 days. Pt son states pt has a bacteria in her stomach and she has multiple ulcers in stomach and every time she puts something in her mouth she gets nauseated. Pt sent by dr for IV hydration.

## 2023-12-04 NOTE — ED Notes (Signed)
Pt stated they was leaving and seen leaving ED

## 2023-12-04 NOTE — Assessment & Plan Note (Signed)
Patient now reports a weeklong history of nausea and vomiting that has not improved.  She has been seen outpatient by me once as well as GI, who tried to keep patient outpatient.  On my initial evaluation, patient was able to keep things down, and now is unable to do so.  She went to go see GI and at that time had electrolyte derangements with hypokalemia.  Patient was given supplementation, but never took.  She now has not eaten in 8 days.  On exam today patient is tachycardic, and blood pressures are borderline low.  She has decreased skin turgor and a dry oropharynx.  I am concerned about hypovolemia at this point.  Given outpatient management has not helped patient, will need to go to the emergency department for IV hydration.  Will likely also need IV antiemetics.  Differential at this time is vast, but do not think patient has a small bowel obstruction based off my exam today.  Could be related to gastroparesis, acute GI viral illness, peptic ulcer disease related, H. pylori infection related.  Given patient has failed outpatient management, will need to send to emergency department.  Plan: -Sent patient to the emergency department for IV antiemetics and IV hydration -May need admission for further workup

## 2023-12-04 NOTE — ED Provider Triage Note (Signed)
Emergency Medicine Provider Triage Evaluation Note  Kim [name] , a 63 y.o. female  was evaluated in triage.  Pt complains of vomiting x 8 days. Was seen at PCP this afternoon and was sent to ED for IV fluids and meds. Has been seen by GI for same symptoms, concern for H pylori  Review of Systems  Positive: Abd pain, anorexia, N/V Negative:   Physical Exam  There were no vitals taken for this visit. Gen:   Awake, no distress   Resp:  Normal effort  MSK:   Moves extremities without difficulty  Other:  Appears ill  Medical Decision Making  Medically screening exam initiated at 4:51 PM.  Appropriate orders placed.  Kim [name] was informed that the remainder of the evaluation will be completed by another provider, this initial triage assessment does not replace that evaluation, and the importance of remaining in the ED until their evaluation is complete.     Su Monks, PA-C 12/04/23 1651

## 2023-12-04 NOTE — Patient Instructions (Addendum)
Kim [name],Thank you for allowing me to take part in your care today.  Here are your instructions.  1.  We are sending you to the emergency department for IV hydration and IV antiemetics.  Thank you, Dr. Allena Katz  If you have any other questions please contact the internal medicine clinic at [phone] If it is after hours, please call the [facility] at [phone] and then ask the person who picks up for the resident on call.

## 2023-12-05 ENCOUNTER — Telehealth: Payer: Self-pay | Admitting: *Deleted

## 2023-12-05 ENCOUNTER — Emergency Department
Admission: EM | Admit: 2023-12-05 | Discharge: 2023-12-05 | Disposition: A | Discharge status: Home / Self Care | Payer: Medicaid Other | Attending: Emergency Medicine | Admitting: Emergency Medicine

## 2023-12-05 ENCOUNTER — Other Ambulatory Visit: Payer: Self-pay

## 2023-12-05 ENCOUNTER — Encounter (HOSPITAL_COMMUNITY): Payer: Self-pay

## 2023-12-05 ENCOUNTER — Emergency Department: Payer: Medicaid Other

## 2023-12-05 ENCOUNTER — Emergency Department (HOSPITAL_COMMUNITY): Payer: Medicaid Other

## 2023-12-05 ENCOUNTER — Inpatient Hospital Stay (HOSPITAL_COMMUNITY)
Admission: EM | Admit: 2023-12-05 | Discharge: 2023-12-14 | DRG: 392 | Disposition: A | Discharge status: Skilled Nursing Facility | Payer: Medicaid Other | Attending: Internal Medicine | Admitting: Internal Medicine

## 2023-12-05 DIAGNOSIS — M79606 Pain in leg, unspecified: Secondary | ICD-10-CM | POA: Diagnosis present

## 2023-12-05 DIAGNOSIS — K5900 Constipation, unspecified: Secondary | ICD-10-CM | POA: Diagnosis not present

## 2023-12-05 DIAGNOSIS — F17210 Nicotine dependence, cigarettes, uncomplicated: Secondary | ICD-10-CM | POA: Diagnosis present

## 2023-12-05 DIAGNOSIS — Z743 Need for continuous supervision: Secondary | ICD-10-CM | POA: Diagnosis not present

## 2023-12-05 DIAGNOSIS — Z5321 Procedure and treatment not carried out due to patient leaving prior to being seen by health care provider: Secondary | ICD-10-CM | POA: Insufficient documentation

## 2023-12-05 DIAGNOSIS — E871 Hypo-osmolality and hyponatremia: Secondary | ICD-10-CM | POA: Diagnosis present

## 2023-12-05 DIAGNOSIS — E46 Unspecified protein-calorie malnutrition: Secondary | ICD-10-CM | POA: Diagnosis present

## 2023-12-05 DIAGNOSIS — I129 Hypertensive chronic kidney disease with stage 1 through stage 4 chronic kidney disease, or unspecified chronic kidney disease: Secondary | ICD-10-CM | POA: Diagnosis present

## 2023-12-05 DIAGNOSIS — Z79899 Other long term (current) drug therapy: Secondary | ICD-10-CM

## 2023-12-05 DIAGNOSIS — B9681 Helicobacter pylori [H. pylori] as the cause of diseases classified elsewhere: Secondary | ICD-10-CM | POA: Diagnosis present

## 2023-12-05 DIAGNOSIS — I959 Hypotension, unspecified: Secondary | ICD-10-CM | POA: Diagnosis not present

## 2023-12-05 DIAGNOSIS — J439 Emphysema, unspecified: Secondary | ICD-10-CM | POA: Diagnosis not present

## 2023-12-05 DIAGNOSIS — Z6827 Body mass index (BMI) 27.0-27.9, adult: Secondary | ICD-10-CM

## 2023-12-05 DIAGNOSIS — I700 Atherosclerosis of aorta: Secondary | ICD-10-CM | POA: Diagnosis not present

## 2023-12-05 DIAGNOSIS — R1110 Vomiting, unspecified: Secondary | ICD-10-CM | POA: Diagnosis not present

## 2023-12-05 DIAGNOSIS — E860 Dehydration: Secondary | ICD-10-CM | POA: Diagnosis present

## 2023-12-05 DIAGNOSIS — R1013 Epigastric pain: Principal | ICD-10-CM

## 2023-12-05 DIAGNOSIS — Z8249 Family history of ischemic heart disease and other diseases of the circulatory system: Secondary | ICD-10-CM

## 2023-12-05 DIAGNOSIS — R079 Chest pain, unspecified: Secondary | ICD-10-CM | POA: Diagnosis not present

## 2023-12-05 DIAGNOSIS — G629 Polyneuropathy, unspecified: Secondary | ICD-10-CM | POA: Diagnosis present

## 2023-12-05 DIAGNOSIS — D696 Thrombocytopenia, unspecified: Secondary | ICD-10-CM | POA: Diagnosis present

## 2023-12-05 DIAGNOSIS — K5909 Other constipation: Secondary | ICD-10-CM | POA: Diagnosis present

## 2023-12-05 DIAGNOSIS — J449 Chronic obstructive pulmonary disease, unspecified: Secondary | ICD-10-CM | POA: Diagnosis present

## 2023-12-05 DIAGNOSIS — N1831 Chronic kidney disease, stage 3a: Secondary | ICD-10-CM | POA: Diagnosis present

## 2023-12-05 DIAGNOSIS — J9610 Chronic respiratory failure, unspecified whether with hypoxia or hypercapnia: Secondary | ICD-10-CM | POA: Diagnosis present

## 2023-12-05 DIAGNOSIS — Z800 Family history of malignant neoplasm of digestive organs: Secondary | ICD-10-CM

## 2023-12-05 DIAGNOSIS — R112 Nausea with vomiting, unspecified: Secondary | ICD-10-CM | POA: Diagnosis present

## 2023-12-05 DIAGNOSIS — E876 Hypokalemia: Secondary | ICD-10-CM | POA: Diagnosis present

## 2023-12-05 DIAGNOSIS — K3184 Gastroparesis: Principal | ICD-10-CM | POA: Diagnosis present

## 2023-12-05 DIAGNOSIS — R531 Weakness: Secondary | ICD-10-CM | POA: Insufficient documentation

## 2023-12-05 DIAGNOSIS — Z9981 Dependence on supplemental oxygen: Secondary | ICD-10-CM

## 2023-12-05 DIAGNOSIS — R339 Retention of urine, unspecified: Secondary | ICD-10-CM | POA: Diagnosis not present

## 2023-12-05 DIAGNOSIS — R1084 Generalized abdominal pain: Secondary | ICD-10-CM | POA: Diagnosis not present

## 2023-12-05 DIAGNOSIS — L89151 Pressure ulcer of sacral region, stage 1: Secondary | ICD-10-CM | POA: Diagnosis present

## 2023-12-05 DIAGNOSIS — G4730 Sleep apnea, unspecified: Secondary | ICD-10-CM | POA: Diagnosis present

## 2023-12-05 DIAGNOSIS — I7143 Infrarenal abdominal aortic aneurysm, without rupture: Secondary | ICD-10-CM | POA: Diagnosis present

## 2023-12-05 DIAGNOSIS — G8929 Other chronic pain: Secondary | ICD-10-CM | POA: Diagnosis present

## 2023-12-05 DIAGNOSIS — M792 Neuralgia and neuritis, unspecified: Secondary | ICD-10-CM

## 2023-12-05 DIAGNOSIS — A048 Other specified bacterial intestinal infections: Principal | ICD-10-CM | POA: Diagnosis present

## 2023-12-05 DIAGNOSIS — Z8711 Personal history of peptic ulcer disease: Secondary | ICD-10-CM

## 2023-12-05 LAB — CBC
HCT: 46.5 % — ABNORMAL HIGH (ref 36.0–46.0)
HCT: 44.9 % (ref 36.0–46.0)
Hemoglobin: 16.3 g/dL — ABNORMAL HIGH (ref 12.0–15.0)
Hemoglobin: 15.8 g/dL — ABNORMAL HIGH (ref 12.0–15.0)
MCH: 35.1 pg — ABNORMAL HIGH (ref 26.0–34.0)
MCH: 35.3 pg — ABNORMAL HIGH (ref 26.0–34.0)
MCHC: 35.1 g/dL (ref 30.0–36.0)
MCHC: 35.2 g/dL (ref 30.0–36.0)
MCV: 100 fL (ref 80.0–100.0)
MCV: 100.4 fL — ABNORMAL HIGH (ref 80.0–100.0)
Platelets: 169 10*3/uL (ref 150–400)
Platelets: 183 10*3/uL (ref 150–400)
RBC: 4.65 MIL/uL (ref 3.87–5.11)
RBC: 4.47 MIL/uL (ref 3.87–5.11)
RDW: 14.9 % (ref 11.5–15.5)
RDW: 15 % (ref 11.5–15.5)
WBC: 17.3 10*3/uL — ABNORMAL HIGH (ref 4.0–10.5)
WBC: 17.4 10*3/uL — ABNORMAL HIGH (ref 4.0–10.5)
nRBC: 0 % (ref 0.0–0.2)
nRBC: 0 % (ref 0.0–0.2)

## 2023-12-05 LAB — COMPREHENSIVE METABOLIC PANEL
ALT: 14 U/L (ref 0–44)
ALT: 12 U/L (ref 0–44)
AST: 25 U/L (ref 15–41)
AST: 19 U/L (ref 15–41)
Albumin: 3.6 g/dL (ref 3.5–5.0)
Albumin: 3.3 g/dL — ABNORMAL LOW (ref 3.5–5.0)
Alkaline Phosphatase: 101 U/L (ref 38–126)
Alkaline Phosphatase: 104 U/L (ref 38–126)
Anion gap: 21 — ABNORMAL HIGH (ref 5–15)
Anion gap: 18 — ABNORMAL HIGH (ref 5–15)
BUN: 23 mg/dL (ref 8–23)
BUN: 21 mg/dL (ref 8–23)
CO2: 22 mmol/L (ref 22–32)
CO2: 26 mmol/L (ref 22–32)
Calcium: 9.6 mg/dL (ref 8.9–10.3)
Calcium: 9.7 mg/dL (ref 8.9–10.3)
Chloride: 87 mmol/L — ABNORMAL LOW (ref 98–111)
Chloride: 87 mmol/L — ABNORMAL LOW (ref 98–111)
Creatinine, Ser: 1.19 mg/dL — ABNORMAL HIGH (ref 0.44–1.00)
Creatinine, Ser: 1.45 mg/dL — ABNORMAL HIGH (ref 0.44–1.00)
GFR, Estimated: 51 mL/min — ABNORMAL LOW (ref >60)
GFR, Estimated: 41 mL/min — ABNORMAL LOW (ref >60)
Glucose, Bld: 84 mg/dL (ref 70–99)
Glucose, Bld: 82 mg/dL (ref 70–99)
Potassium: 3.1 mmol/L — ABNORMAL LOW (ref 3.5–5.1)
Potassium: 4 mmol/L (ref 3.5–5.1)
Sodium: 130 mmol/L — ABNORMAL LOW (ref 135–145)
Sodium: 131 mmol/L — ABNORMAL LOW (ref 135–145)
Total Bilirubin: 2 mg/dL — ABNORMAL HIGH (ref 0.0–1.2)
Total Bilirubin: 1.9 mg/dL — ABNORMAL HIGH (ref 0.0–1.2)
Total Protein: 7.6 g/dL (ref 6.5–8.1)
Total Protein: 7.3 g/dL (ref 6.5–8.1)

## 2023-12-05 LAB — LIPASE, BLOOD
Lipase: 35 U/L (ref 11–51)
Lipase: 34 U/L (ref 11–51)

## 2023-12-05 LAB — TROPONIN I (HIGH SENSITIVITY)
Troponin I (High Sensitivity): 13 ng/L (ref <18)
Troponin I (High Sensitivity): 9 ng/L (ref <18)

## 2023-12-05 NOTE — ED Notes (Signed)
This Charge RN spoke to patient's family member about their concerns about the wait and patient requesting pain medication.  Triage RN and triage provider made aware of patients request for pain medication.

## 2023-12-05 NOTE — Telephone Encounter (Signed)
 Thank you :)

## 2023-12-05 NOTE — ED Provider Triage Note (Signed)
 Emergency Medicine Provider Triage Evaluation Note  Kim [name] , a 63 y.o. female  was evaluated in triage.  Pt complains of vomiting for 8 days.  Doesn't remember last BM.  Sent by doctor thinking that she has a bacteria in her intestine.  + Weakness due to vomiting.   Review of Systems  Positive: Vomiting, abdominal pain, constipation Negative: No fever  Physical Exam  BP (!) 105/56   Pulse 98   Temp (!) 97.4 F (36.3 C) (Oral)   Resp 20   Ht 5' 5 (1.651 m)   Wt 74.4 kg   SpO2 93%   BMI 27.29 kg/m  Gen:   Awake, no distress  appears uncomfortable Resp:  Normal effort  MSK:   Moves extremities without difficulty  Other:    Medical Decision Making  Medically screening exam initiated at 11:31 AM.  Appropriate orders placed.  Valeree Leidy Thomason was informed that the remainder of the evaluation will be completed by another provider, this initial triage assessment does not replace that evaluation, and the importance of remaining in the ED until their evaluation is complete.     Saunders Shona LITTIE, PA-C 12/05/23 1136

## 2023-12-05 NOTE — ED Provider Triage Note (Signed)
 Emergency Medicine Provider Triage Evaluation Note  Kim [name] , a 63 y.o. female  was evaluated in triage.  Pt complains of generalized abdominal pain, nausea, vomiting, and chest pains ongoing for a couple of weeks. Denies any shortness of breath  Review of Systems  Positive: As above Negative: As above  Physical Exam  BP 111/80   Pulse 94   Temp 98 F (36.7 C) (Oral)   Resp 18   Ht 5' 5 (1.651 m)   Wt 74.4 kg   SpO2 98%   BMI 27.29 kg/m  Gen:   Awake, no distress   Resp:  Normal effort  MSK:   Moves extremities without difficulty    Medical Decision Making  Medically screening exam initiated at 6:21 PM.  Appropriate orders placed.  Kim [name] was informed that the remainder of the evaluation will be completed by another provider, this initial triage assessment does not replace that evaluation, and the importance of remaining in the ED until their evaluation is complete.     Veta Palma, PA-C 12/05/23 1823

## 2023-12-05 NOTE — ED Notes (Signed)
Pt called for vital check and no answer.

## 2023-12-05 NOTE — ED Notes (Signed)
No answer for labs

## 2023-12-05 NOTE — ED Triage Notes (Signed)
 Pt to ED from home sent by PCP for poor appetite, vomiting, and bacterial infection in intestines since 8 days. Pt unsure of LBM, may be constipated. Pt was on abx but could not hold down. Pt feels nauseous. Denies abdominal pain. Last emesis was last night. Small amount. Denies urinary symptoms.

## 2023-12-05 NOTE — ED Triage Notes (Addendum)
Pt BIB [facility] from home for abd pain/N/V xweeks. H/x h. Pylori. LWBS yesterday for same.  108/71 97% 94 cbg 100 HR

## 2023-12-05 NOTE — ED Notes (Signed)
Pt refusing CT scan in triage

## 2023-12-05 NOTE — Telephone Encounter (Signed)
 Call from patient states she waited in the ER from 3 PM until 10 PM.  Left and is asking for a Direct Admission to the hospital and to be called when a bed is ready.  Spoke with Dr. Tobie.  Would like for patient to go to [facility] ER for assessment of her Nausea and Vomiting.  Patient was informed that Dr. Tobie checked and there is about an hours wait in the ER.  Patient was advised to go as soon as possible as waiting for a bed at [facility] may not be until tomorrow.   Patient will go to [facility].

## 2023-12-06 ENCOUNTER — Encounter (HOSPITAL_COMMUNITY): Payer: Self-pay | Admitting: Infectious Diseases

## 2023-12-06 ENCOUNTER — Ambulatory Visit: Payer: Medicaid Other

## 2023-12-06 ENCOUNTER — Emergency Department (HOSPITAL_COMMUNITY): Payer: Medicaid Other

## 2023-12-06 DIAGNOSIS — I714 Abdominal aortic aneurysm, without rupture, unspecified: Secondary | ICD-10-CM | POA: Diagnosis not present

## 2023-12-06 DIAGNOSIS — R112 Nausea with vomiting, unspecified: Secondary | ICD-10-CM | POA: Diagnosis present

## 2023-12-06 DIAGNOSIS — K5732 Diverticulitis of large intestine without perforation or abscess without bleeding: Secondary | ICD-10-CM | POA: Diagnosis not present

## 2023-12-06 DIAGNOSIS — K828 Other specified diseases of gallbladder: Secondary | ICD-10-CM | POA: Diagnosis not present

## 2023-12-06 DIAGNOSIS — K3580 Unspecified acute appendicitis: Secondary | ICD-10-CM | POA: Diagnosis not present

## 2023-12-06 LAB — CBC WITH DIFFERENTIAL/PLATELET
Abs Immature Granulocytes: 0.11 10*3/uL — ABNORMAL HIGH (ref 0.00–0.07)
Basophils Absolute: 0.1 10*3/uL (ref 0.0–0.1)
Basophils Relative: 0 %
Eosinophils Absolute: 0 10*3/uL (ref 0.0–0.5)
Eosinophils Relative: 0 %
HCT: 37.4 % (ref 36.0–46.0)
Hemoglobin: 13.1 g/dL (ref 12.0–15.0)
Immature Granulocytes: 1 %
Lymphocytes Relative: 13 %
Lymphs Abs: 1.7 10*3/uL (ref 0.7–4.0)
MCH: 35.8 pg — ABNORMAL HIGH (ref 26.0–34.0)
MCHC: 35 g/dL (ref 30.0–36.0)
MCV: 102.2 fL — ABNORMAL HIGH (ref 80.0–100.0)
Monocytes Absolute: 0.5 10*3/uL (ref 0.1–1.0)
Monocytes Relative: 4 %
Neutro Abs: 10.2 10*3/uL — ABNORMAL HIGH (ref 1.7–7.7)
Neutrophils Relative %: 82 %
Platelets: 134 10*3/uL — ABNORMAL LOW (ref 150–400)
RBC: 3.66 MIL/uL — ABNORMAL LOW (ref 3.87–5.11)
RDW: 15 % (ref 11.5–15.5)
WBC: 12.6 10*3/uL — ABNORMAL HIGH (ref 4.0–10.5)
nRBC: 0 % (ref 0.0–0.2)

## 2023-12-06 LAB — URINALYSIS, ROUTINE W REFLEX MICROSCOPIC
Bacteria, UA: NONE SEEN
Glucose, UA: NEGATIVE mg/dL
Hgb urine dipstick: NEGATIVE
Ketones, ur: 20 mg/dL — AB
Leukocytes,Ua: NEGATIVE
Nitrite: NEGATIVE
Protein, ur: 100 mg/dL — AB
Specific Gravity, Urine: 1.024 (ref 1.005–1.030)
pH: 5 (ref 5.0–8.0)

## 2023-12-06 LAB — COMPREHENSIVE METABOLIC PANEL
ALT: 10 U/L (ref 0–44)
AST: 17 U/L (ref 15–41)
Albumin: 2.5 g/dL — ABNORMAL LOW (ref 3.5–5.0)
Alkaline Phosphatase: 81 U/L (ref 38–126)
Anion gap: 17 — ABNORMAL HIGH (ref 5–15)
BUN: 24 mg/dL — ABNORMAL HIGH (ref 8–23)
CO2: 21 mmol/L — ABNORMAL LOW (ref 22–32)
Calcium: 8.4 mg/dL — ABNORMAL LOW (ref 8.9–10.3)
Chloride: 90 mmol/L — ABNORMAL LOW (ref 98–111)
Creatinine, Ser: 1.31 mg/dL — ABNORMAL HIGH (ref 0.44–1.00)
GFR, Estimated: 46 mL/min — ABNORMAL LOW (ref >60)
Glucose, Bld: 60 mg/dL — ABNORMAL LOW (ref 70–99)
Potassium: 3.1 mmol/L — ABNORMAL LOW (ref 3.5–5.1)
Sodium: 128 mmol/L — ABNORMAL LOW (ref 135–145)
Total Bilirubin: 1.5 mg/dL — ABNORMAL HIGH (ref 0.0–1.2)
Total Protein: 5.9 g/dL — ABNORMAL LOW (ref 6.5–8.1)

## 2023-12-06 LAB — CBG MONITORING, ED
Glucose-Capillary: 63 mg/dL — ABNORMAL LOW (ref 70–99)
Glucose-Capillary: 87 mg/dL (ref 70–99)

## 2023-12-06 LAB — RESP PANEL BY RT-PCR (RSV, FLU A&B, COVID)  RVPGX2
Influenza A by PCR: NEGATIVE
Influenza B by PCR: NEGATIVE
Resp Syncytial Virus by PCR: NEGATIVE
SARS Coronavirus 2 by RT PCR: NEGATIVE

## 2023-12-06 LAB — GLUCOSE, CAPILLARY: Glucose-Capillary: 96 mg/dL (ref 70–99)

## 2023-12-06 LAB — MAGNESIUM: Magnesium: 1.9 mg/dL (ref 1.7–2.4)

## 2023-12-06 LAB — PHOSPHORUS: Phosphorus: 2.1 mg/dL — ABNORMAL LOW (ref 2.5–4.6)

## 2023-12-06 MED ORDER — POTASSIUM CHLORIDE 10 MEQ/100ML IV SOLN
10.0000 meq | INTRAVENOUS | Status: DC
  Administered 2023-12-06: 10 meq via INTRAVENOUS
  Filled 2023-12-06 (×3): qty 100

## 2023-12-06 MED ORDER — POTASSIUM CHLORIDE CRYS ER 20 MEQ PO TBCR
40.0000 meq | EXTENDED_RELEASE_TABLET | Freq: Once | ORAL | Status: AC
  Administered 2023-12-06: 40 meq via ORAL
  Filled 2023-12-06: qty 2

## 2023-12-06 MED ORDER — ENSURE ENLIVE PO LIQD
237.0000 mL | Freq: Two times a day (BID) | ORAL | Status: DC
  Administered 2023-12-06 – 2023-12-12 (×6): 237 mL via ORAL
  Filled 2023-12-06: qty 237

## 2023-12-06 MED ORDER — LACTATED RINGERS IV BOLUS
500.0000 mL | Freq: Once | INTRAVENOUS | Status: AC
  Administered 2023-12-06: 500 mL via INTRAVENOUS

## 2023-12-06 MED ORDER — SENNA 8.6 MG PO TABS
1.0000 | ORAL_TABLET | Freq: Every day | ORAL | Status: DC
  Administered 2023-12-06 – 2023-12-07 (×2): 8.6 mg via ORAL
  Filled 2023-12-06 (×3): qty 1

## 2023-12-06 MED ORDER — POLYETHYLENE GLYCOL 3350 17 G PO PACK
17.0000 g | PACK | Freq: Every day | ORAL | Status: DC
  Administered 2023-12-06 – 2023-12-09 (×3): 17 g via ORAL
  Filled 2023-12-06 (×4): qty 1

## 2023-12-06 MED ORDER — PANTOPRAZOLE SODIUM 40 MG PO TBEC
40.0000 mg | DELAYED_RELEASE_TABLET | Freq: Every day | ORAL | Status: DC
Start: 2023-12-07 — End: 2023-12-08
  Administered 2023-12-07 – 2023-12-08 (×2): 40 mg via ORAL
  Filled 2023-12-06 (×2): qty 1

## 2023-12-06 MED ORDER — ONDANSETRON HCL 4 MG/2ML IJ SOLN
4.0000 mg | Freq: Once | INTRAMUSCULAR | Status: AC
  Administered 2023-12-06: 4 mg via INTRAVENOUS
  Filled 2023-12-06: qty 2

## 2023-12-06 MED ORDER — CAPSAICIN 0.025 % EX CREA
TOPICAL_CREAM | CUTANEOUS | Status: DC | PRN
Start: 2023-12-06 — End: 2023-12-14
  Filled 2023-12-06: qty 60

## 2023-12-06 MED ORDER — ACETAMINOPHEN 325 MG PO TABS
975.0000 mg | ORAL_TABLET | Freq: Once | ORAL | Status: AC
Start: 2023-12-06 — End: 2023-12-06
  Administered 2023-12-06: 975 mg via ORAL
  Filled 2023-12-06: qty 3

## 2023-12-06 MED ORDER — LIDOCAINE VISCOUS HCL 2 % MT SOLN
15.0000 mL | Freq: Once | OROMUCOSAL | Status: AC
  Administered 2023-12-06: 15 mL via ORAL
  Filled 2023-12-06: qty 15

## 2023-12-06 MED ORDER — ENOXAPARIN SODIUM 40 MG/0.4ML IJ SOSY
40.0000 mg | PREFILLED_SYRINGE | INTRAMUSCULAR | Status: DC
  Administered 2023-12-06 – 2023-12-12 (×7): 40 mg via SUBCUTANEOUS
  Filled 2023-12-06 (×8): qty 0.4

## 2023-12-06 MED ORDER — IOHEXOL 350 MG/ML SOLN
75.0000 mL | Freq: Once | INTRAVENOUS | Status: AC | PRN
  Administered 2023-12-06: 75 mL via INTRAVENOUS

## 2023-12-06 MED ORDER — PRAVASTATIN SODIUM 40 MG PO TABS
20.0000 mg | ORAL_TABLET | Freq: Every day | ORAL | Status: DC
  Administered 2023-12-07 – 2023-12-13 (×7): 20 mg via ORAL
  Filled 2023-12-06 (×7): qty 1

## 2023-12-06 MED ORDER — SODIUM CHLORIDE 0.9 % IV BOLUS
1000.0000 mL | Freq: Once | INTRAVENOUS | Status: AC
  Administered 2023-12-06: 1000 mL via INTRAVENOUS

## 2023-12-06 MED ORDER — UMECLIDINIUM BROMIDE 62.5 MCG/ACT IN AEPB
1.0000 | INHALATION_SPRAY | Freq: Every day | RESPIRATORY_TRACT | Status: DC
  Administered 2023-12-07 – 2023-12-14 (×7): 1 via RESPIRATORY_TRACT
  Filled 2023-12-06: qty 7

## 2023-12-06 MED ORDER — ACETAMINOPHEN 325 MG PO TABS
650.0000 mg | ORAL_TABLET | Freq: Four times a day (QID) | ORAL | Status: DC | PRN
  Administered 2023-12-06 – 2023-12-13 (×14): 650 mg via ORAL
  Filled 2023-12-06 (×14): qty 2

## 2023-12-06 MED ORDER — PANTOPRAZOLE SODIUM 40 MG IV SOLR
40.0000 mg | Freq: Once | INTRAVENOUS | Status: AC
  Administered 2023-12-06: 40 mg via INTRAVENOUS
  Filled 2023-12-06: qty 10

## 2023-12-06 MED ORDER — ONDANSETRON 4 MG PO TBDP
4.0000 mg | ORAL_TABLET | Freq: Once | ORAL | Status: AC
  Administered 2023-12-06: 4 mg via ORAL
  Filled 2023-12-06: qty 1

## 2023-12-06 MED ORDER — ACETAMINOPHEN 650 MG RE SUPP: 650.0000 mg | Freq: Four times a day (QID) | RECTAL | Status: DC | PRN

## 2023-12-06 MED ORDER — POTASSIUM CHLORIDE CRYS ER 20 MEQ PO TBCR
40.0000 meq | EXTENDED_RELEASE_TABLET | Freq: Once | ORAL | Status: DC
  Filled 2023-12-06: qty 2

## 2023-12-06 MED ORDER — ALUM & MAG HYDROXIDE-SIMETH 200-200-20 MG/5ML PO SUSP
30.0000 mL | Freq: Once | ORAL | Status: AC
  Administered 2023-12-06: 30 mL via ORAL
  Filled 2023-12-06: qty 30

## 2023-12-06 MED ORDER — NICOTINE 14 MG/24HR TD PT24
14.0000 mg | MEDICATED_PATCH | Freq: Every day | TRANSDERMAL | Status: DC
  Administered 2023-12-06 – 2023-12-14 (×9): 14 mg via TRANSDERMAL
  Filled 2023-12-06 (×9): qty 1

## 2023-12-06 MED ORDER — ONDANSETRON HCL 4 MG/2ML IJ SOLN
4.0000 mg | Freq: Four times a day (QID) | INTRAMUSCULAR | Status: DC | PRN
  Administered 2023-12-07: 4 mg via INTRAVENOUS
  Filled 2023-12-06: qty 2

## 2023-12-06 NOTE — ED Provider Notes (Signed)
 [place] EMERGENCY DEPARTMENT AT [facility] Provider Note   CSN: [id number] Arrival date & time: 12/05/23  1744     History  Chief Complaint  Patient presents with   Abdominal Pain    Kim [name] is a 63 y.o. female.  Patient is a 63 year old female with recent diagnosis of H. pylori and gastritis, COPD on 3 L home O2 as needed, hypertension, CKD presenting to the emergency department with nausea, vomiting and abdominal pain.  Patient states that her symptoms have been ongoing for about the last 2 weeks and states she has been unable to keep anything down for the last week.  She states she was unable to complete her treatment for H. pylori because she is vomiting and unable to keep the medications down.  She denies any fevers or hematemesis.  She states that she has not had a bowel movement in the last week but is also not ate anything solid.  Denies any dysuria or hematuria.  She was seen by her primary doctor who recommended that she come to the emergency department for IV fluids.  The history is provided by the patient and a relative.  Abdominal Pain      Home Medications Prior to Admission medications   Medication Sig Start Date End Date Taking? Authorizing Provider  acetaminophen  (TYLENOL ) 500 MG tablet Take 500 mg by mouth 3 (three) times daily as needed (pain).    [provider]  albuterol  (VENTOLIN  HFA) 108 (90 Base) MCG/ACT inhaler Inhale 2 puffs into the lungs every 4 (four) hours as needed for wheezing or shortness of breath. 10/07/22   Addie Perkins, DO  amLODipine  (NORVASC ) 5 MG tablet Take 1 tablet (5 mg total) by mouth daily. Patient not taking: Reported on 12/01/2023 08/12/22 10/11/22  Tobie Gaines, DO  Bismuth /Metronidaz/Tetracyclin [facility]) 140-125-125 MG CAPS Take 3 capsules by mouth 4 (four) times daily for 10 days. 10/23/23 11/02/23  Atway, Rayann N, DO  Nutritional Supplements (ENSURE COMPLETE SHAKE) LIQD Take 1 each by mouth in the morning  and at bedtime. 10/20/23   Kandis Perkins, DO  omeprazole  (PRILOSEC ) 20 MG capsule Take 1 capsule (20 mg total) by mouth 2 (two) times daily before a meal for 10 days. 10/31/23 11/10/23  Zheng, Michael, DO  ondansetron  (ZOFRAN -ODT) 4 MG disintegrating tablet Take 1 tablet (4 mg total) by mouth every 6 (six) hours. 11/30/23   May, Deanna J, NP  OXYGEN  Inhale 3 L into the lungs as needed.    [provider]  potassium chloride  SA (KLOR-CON  M) 20 MEQ tablet Take 2 tablets (40 mEq total) by mouth once for 1 dose. 11/30/23 11/30/23  May, Deanna J, NP  pravastatin  (PRAVACHOL ) 20 MG tablet TAKE 1 TABLET BY MOUTH EVERYDAY AT BEDTIME Patient taking differently: Take 20 mg by mouth at bedtime. 12/13/22   Tobie Gaines, DO  senna-docusate (SENOKOT-S) 8.6-50 MG tablet Take 1 tablet by mouth at bedtime as needed for mild constipation. 05/09/22   Elnora Ip, MD  sucralfate  (CARAFATE ) 1 GM/10ML suspension Take 10 mLs (1 g total) by mouth 4 (four) times daily -  with meals and at bedtime. 10/20/23   Kandis Perkins, DO  triamcinolone  (KENALOG ) 0.025 % ointment Apply 1 Application topically 2 (two) times daily as needed (Itching). 06/17/22   Addie Perkins, DO  umeclidinium bromide  (INCRUSE ELLIPTA ) 62.5 MCG/ACT AEPB Inhale 1 puff into the lungs daily. 03/07/23 03/06/24  Nguyen, Quan, DO      Allergies    Patient  has no known allergies.    Review of Systems   Review of Systems  Gastrointestinal:  Positive for abdominal pain.    Physical Exam Updated Vital Signs BP (!) 95/58 (BP Location: Left Arm)   Pulse 97   Temp 97.7 F (36.5 C) (Oral)   Resp 17   Ht 5' 5 (1.651 m)   Wt 74.4 kg   SpO2 99%   BMI 27.29 kg/m  Physical Exam Vitals and nursing note reviewed.  Constitutional:      General: She is not in acute distress.    Appearance: She is well-developed. She is ill-appearing.  HENT:     Head: Normocephalic and atraumatic.     Mouth/Throat:     Pharynx: Oropharynx is clear.     Comments:  Dry mucous membranes Eyes:     Extraocular Movements: Extraocular movements intact.  Cardiovascular:     Rate and Rhythm: Normal rate and regular rhythm.     Heart sounds: Normal heart sounds.  Pulmonary:     Effort: Pulmonary effort is normal.     Breath sounds: Normal breath sounds.  Abdominal:     General: Abdomen is flat.     Palpations: Abdomen is soft.     Tenderness: There is abdominal tenderness in the epigastric area. There is no guarding or rebound.  Skin:    General: Skin is warm and dry.  Neurological:     General: No focal deficit present.     Mental Status: She is alert and oriented to person, place, and time.  Psychiatric:        Mood and Affect: Mood normal.        Behavior: Behavior normal.     ED Results / Procedures / Treatments   Labs (all labs ordered are listed, but only abnormal results are displayed) Labs Reviewed  COMPREHENSIVE METABOLIC PANEL - Abnormal; Notable for the following components:      Result Value   Sodium 131 (*)    Chloride 87 (*)    Creatinine, Ser 1.45 (*)    Albumin 3.3 (*)    Total Bilirubin 1.9 (*)    GFR, Estimated 41 (*)    Anion gap 18 (*)    All other components within normal limits  CBC - Abnormal; Notable for the following components:   WBC 17.4 (*)    Hemoglobin 15.8 (*)    MCV 100.4 (*)    MCH 35.3 (*)    All other components within normal limits  URINALYSIS, ROUTINE W REFLEX MICROSCOPIC - Abnormal; Notable for the following components:   Color, Urine AMBER (*)    APPearance HAZY (*)    Bilirubin Urine SMALL (*)    Ketones, ur 20 (*)    Protein, ur 100 (*)    All other components within normal limits  COMPREHENSIVE METABOLIC PANEL - Abnormal; Notable for the following components:   Sodium 128 (*)    Potassium 3.1 (*)    Chloride 90 (*)    CO2 21 (*)    Glucose, Bld 60 (*)    BUN 24 (*)    Creatinine, Ser 1.31 (*)    Calcium  8.4 (*)    Total Protein 5.9 (*)    Albumin 2.5 (*)    Total Bilirubin 1.5 (*)     GFR, Estimated 46 (*)    Anion gap 17 (*)    All other components within normal limits  CBC WITH DIFFERENTIAL/PLATELET - Abnormal; Notable for the following components:  WBC 12.6 (*)    RBC 3.66 (*)    MCV 102.2 (*)    MCH 35.8 (*)    Platelets 134 (*)    Neutro Abs 10.2 (*)    Abs Immature Granulocytes 0.11 (*)    All other components within normal limits  CBG MONITORING, ED - Abnormal; Notable for the following components:   Glucose-Capillary 63 (*)    All other components within normal limits  RESP PANEL BY RT-PCR (RSV, FLU A&B, COVID)  RVPGX2  LIPASE, BLOOD  MAGNESIUM   CBG MONITORING, ED  TROPONIN I (HIGH SENSITIVITY)  TROPONIN I (HIGH SENSITIVITY)    EKG EKG Interpretation Date/Time:  Tuesday December 05 2023 18:55:12 EST Ventricular Rate:  100 PR Interval:  152 QRS Duration:  78 QT Interval:  360 QTC Calculation: 464 R Axis:   178  Text Interpretation: Normal sinus rhythm Right axis deviation Pulmonary disease pattern Right ventricular hypertrophy Nonspecific ST abnormality Abnormal ECG When compared with ECG of 06-Oct-2022 16:34, No significant change was found Confirmed by Raford Lenis ([id number]) on 12/05/2023 11:48:13 PM  Radiology CT ABDOMEN PELVIS W CONTRAST Result Date: 12/06/2023 CLINICAL DATA:  Abdominal pain. Pain medicine given. Bowel obstruction suspected. EXAM: CT ABDOMEN AND PELVIS WITH CONTRAST TECHNIQUE: Multidetector CT imaging of the abdomen and pelvis was performed using the standard protocol following bolus administration of intravenous contrast. RADIATION DOSE REDUCTION: This exam was performed according to the departmental dose-optimization program which includes automated exposure control, adjustment of the mA and/or kV according to patient size and/or use of iterative reconstruction technique. CONTRAST:  75mL OMNIPAQUE  IOHEXOL  350 MG/ML SOLN COMPARISON:  KUB 12/05/2023, CT chest, abdomen, and pelvis of 10/18/2023, CT abdomen and pelvis 05/02/2022  FINDINGS: Lower chest: A small cystic bleb measuring 4 mm is again seen within the anterior superior middle lobe (axial series 4, image 5). The lung bases are clear. Dense coronary artery calcifications are again seen. Hepatobiliary: Smooth liver contours. The gallbladder is mildly mildly distended, measuring up to approximately 4.1 x 4.6 x 7.9 cm, however no gallbladder wall thickening or surrounding inflammatory fat stranding is seen. No intrahepatic or extrahepatic biliary ductal dilatation. Unchanged low-density adjacent to the falciform ligament, consistent with benign focal fatty deposition and similar to comparison studies. Pancreas: Mild pancreatic parenchymal atrophy is similar to prior. No pancreatic ductal dilatation. No surrounding inflammatory fat stranding. Spleen: Normal in size without focal abnormality. Adrenals/Urinary Tract: Normal adrenals. The kidneys enhance uniformly and are symmetric in size without hydronephrosis. Unchanged subcentimeter low-density posterior right middle pole low-attenuation lesion, too small to further characterize but statistically most likely a tiny cyst. No follow-up imaging is recommended. Mild viral chronic renal cortical thinning is unchanged. The urinary bladder is only mildly distended but no urinary bladder wall thickening is seen. Stomach/Bowel: High-grade sigmoid diverticulosis is again seen. Mild chronic wall thickening of sigmoid is unchanged from multiple prior CTs and chronic, possibly secondary to prior diverticulitis. No inflammatory fat stranding is seen around the sigmoid colon to indicate acute diverticulitis. Oral contrast is seen as distal as the rectum. The terminal ileum is unremarkable. The appendix is not confidently identified, however no inflammatory changes are seen around the cecum to indicate secondary signs of acute appendicitis. The appearance is similar to prior. A tubular structure extending inferiorly from the cecum (coronal series 6,  images 75 and 76) may represent a blood vessel versus a normal appendix. No dilated loops of bowel are seen to indicate bowel obstruction. Vascular/Lymphatic: High-grade atherosclerotic calcifications. No significant change  in bilobed distal abdominal aortic aneurysm inferior to the renal arteries and just proximal to the aortic bifurcation measuring up to 3.2 x 3.1 cm (AP by transverse) when measured on orthogonal views and coronal and sagittal images. There is again moderate mixed calcified and noncalcified plaque. No mesenteric, retroperitoneal, or pelvic pathologically enlarged lymph nodes by CT criteria. Reproductive: The uterus is present.  No adnexal mass is seen. Other: No ventral abdominal wall while hernia. No free air or free fluid is seen within the abdomen or pelvis. Musculoskeletal: Partial sacralization of L5. Grade 1 anterolisthesis of L4 on L5, unchanged. Moderate disc space narrowing of the visualized lower thoracic spine. IMPRESSION: 1. No acute abnormality is seen within the abdomen or pelvis. No evidence of bowel obstruction. 2. High-grade sigmoid diverticulosis without evidence of acute diverticulitis. 3. Extensive atherosclerosis. 4. Unchanged bilobed distal abdominal aortic aneurysm measuring up to 3.2 cm. Recommend follow-up ultrasound every 3 years. (Ref.: J Vasc Surg. 2018; 67:2-77 and J Am Coll Radiol 2013;10(10):789-794.) Electronically Signed   By: Tanda Lyons M.D.   On: 12/06/2023 12:18   DG Chest 2 View Result Date: 12/05/2023 CLINICAL DATA:  chest pain EXAM: CHEST - 2 VIEW COMPARISON:  Chest x-ray 10/06/2022, CT chest 10/18/2023 FINDINGS: The heart and mediastinal contours are within normal limits. Atherosclerotic plaque. Hyperinflation of the lungs. No focal consolidation. No pulmonary edema. No pleural effusion. No pneumothorax. No acute osseous abnormality. IMPRESSION: 1. No active cardiopulmonary disease. 2. Aortic Atherosclerosis (ICD10-I70.0) and Emphysema (ICD10-J43.9).  Electronically Signed   By: Morgane  Naveau M.D.   On: 12/05/2023 19:04   DG Abdomen 1 View Result Date: 12/05/2023 CLINICAL DATA:  Constipation, vomiting EXAM: ABDOMEN - 1 VIEW COMPARISON:  None Available. FINDINGS: Moderate to large stool burden throughout the colon. There is a non obstructive bowel gas pattern. No supine evidence of free air. No organomegaly or suspicious calcification. No acute bony abnormality. IMPRESSION: Moderate to large stool burden.  No acute findings. Electronically Signed   By: Franky Crease M.D.   On: 12/05/2023 12:11    Procedures Procedures    Medications Ordered in ED Medications  enoxaparin  (LOVENOX ) injection 40 mg (has no administration in time range)  acetaminophen  (TYLENOL ) tablet 650 mg (has no administration in time range)    Or  acetaminophen  (TYLENOL ) suppository 650 mg (has no administration in time range)  ondansetron  (ZOFRAN ) injection 4 mg (has no administration in time range)  potassium chloride  10 mEq in 100 mL IVPB (has no administration in time range)  feeding supplement (ENSURE ENLIVE / ENSURE PLUS) liquid 237 mL (has no administration in time range)  pantoprazole  (PROTONIX ) EC tablet 40 mg (has no administration in time range)  umeclidinium bromide  (INCRUSE ELLIPTA ) 62.5 MCG/ACT 1 puff (has no administration in time range)  pravastatin  (PRAVACHOL ) tablet 20 mg (has no administration in time range)  polyethylene glycol (MIRALAX  / GLYCOLAX ) packet 17 g (has no administration in time range)  lactated ringers  bolus 500 mL (has no administration in time range)  senna (SENOKOT) tablet 8.6 mg (has no administration in time range)  alum & mag hydroxide-simeth (MAALOX/MYLANTA) 200-200-20 MG/5ML suspension 30 mL (30 mLs Oral Given 12/06/23 0408)    And  lidocaine  (XYLOCAINE ) 2 % viscous mouth solution 15 mL (15 mLs Oral Given 12/06/23 0408)  ondansetron  (ZOFRAN -ODT) disintegrating tablet 4 mg (4 mg Oral Given 12/06/23 0408)  sodium chloride  0.9 % bolus  1,000 mL (1,000 mLs Intravenous New Bag/Given 12/06/23 0945)  iohexol  (OMNIPAQUE ) 350 MG/ML injection 75  mL (75 mLs Intravenous Contrast Given 12/06/23 1023)  acetaminophen  (TYLENOL ) tablet 975 mg (975 mg Oral Given 12/06/23 1209)  pantoprazole  (PROTONIX ) injection 40 mg (40 mg Intravenous Given 12/06/23 1156)  ondansetron  (ZOFRAN ) injection 4 mg (4 mg Intravenous Given 12/06/23 1155)    ED Course/ Medical Decision Making/ A&P Clinical Course as of 12/06/23 1504  Wed Dec 06, 2023  1231 No acute abnormality on CTAP. Suspect symptoms due to gastritis/H pylori. Patient will be admitted for inability to tolerate PO. [VK]    Clinical Course User Index [VK] Kingsley, Miosha Behe K, DO                                 Medical Decision Making This patient presents to the ED with chief complaint(s) of abdominal pain, vomiting with pertinent past medical history of recent diagnosis of gastritis and H. pylori, COPD, CKD, hypertension which further complicates the presenting complaint. The complaint involves an extensive differential diagnosis and also carries with it a high risk of complications and morbidity.    The differential diagnosis includes gastritis, GERD, pancreatitis, hepatitis, dehydration, electrolyte abnormality, PUD, GI bleed  Additional history obtained: Additional history obtained from family Records reviewed previous admission documents, Primary Care Documents, and GI office records  ED Course and Reassessment: On patient's arrival she was hemodynamically stable in no acute distress.  Was initially evaluated in triage and had EKG, labs, chest x-ray and CT abdomen/pelvis ordered.  Patient's labs showed mild AKI with creatinine of 1.4 from baseline around 1.1 with an anion gap of 18 and mild hyponatremia and hypochloremia consistent with dehydration.  She additionally has a leukocytosis though do suspect some hemoconcentration with her elevated hemoglobin.  Labs otherwise within normal range.   Chest x-ray without acute disease.  CT read is pending.  She is received oral Zofran  and GI cocktail and has been started on IV fluids.  She is continuing to have nausea and pain will will be given additional IV Zofran  and PPI and will be closely reassessed.  Independent labs interpretation:  The following labs were independently interpreted: AKI, hyponatremia and hypokalemia, elevated AGAP consistent with dehydration, mild leukocytosis  Independent visualization of imaging: - I independently visualized the following imaging with scope of interpretation limited to determining acute life threatening conditions related to emergency care: CTAP, which revealed no acute disease  Consultation: - Consulted or discussed management/test interpretation w/ external professional: internal medicine  Consideration for admission or further workup: patient requires admission for intractable vomiting Social Determinants of health: N/A    Amount and/or Complexity of Data Reviewed Labs: ordered.  Risk OTC drugs. Prescription drug management. Decision regarding hospitalization.          Final Clinical Impression(s) / ED Diagnoses Final diagnoses:  Epigastric pain  Intractable vomiting  Dehydration    Rx / DC Orders ED Discharge Orders     None         Kingsley, Kizzie Cotten K, DO 12/06/23 1504

## 2023-12-06 NOTE — ED Notes (Signed)
 Pt given orange juice per DO Kingsley.  Naw, NT to recheck CBG in 30 minutes.

## 2023-12-06 NOTE — Progress Notes (Signed)
 Patient adamantly refused IV potassium order, only took one bag.

## 2023-12-06 NOTE — H&P (Signed)
 Date: 12/06/2023               Patient Name:  Kim [name] MRN: [id number]  DOB: 1960-12-17 Age / Sex: 63 y.o., female   PCP: Tobie Gaines, DO         Medical Service: Internal Medicine Teaching Service         Attending Physician: Dr. Ellouise Richerd POUR, DO      First Contact: Dr. Redell Burnet, MD Pager [phone]    Second Contact: Dr. Ozell Nearing, DO Pager [phone]         After Hours (After 5p/  First Contact Pager: [phone]  weekends / holidays): Second Contact Pager: [phone]   SUBJECTIVE   Chief Complaint: nausea, vomiting  History of Present Illness:  Patient is a 63 year old woman with past history of COPD on 3L prn, HTN, CKD, recent diagnosis of H. Pylori and gastritis, presenting with nausea and vomiting. Patient reports her symptoms have been going on for several months, with some worsening over the last week. She has been intermittently nauseous throughout the day and vomits approximately once per day. Her vomit is clear and nonbloody. She was diagnosed with H Pylori gastritis last month and started on triple therapy, however was not able to complete treatment due to her nausea. Also reports she hasn't had a bowel movement in the last week. Denies fever, chest pain, shortness of breath, diarrhea, dysuria, hematuria, recent sick exposures.   ED Course: Afeb, HDS, ORA WBC 17.4, Na 128, Cr 1.3 from baseline ~1.1-1.2 RVP, UA negative CTAP: no acute abnormality or obstruction. Diverticulosis w/o diverticulitis  Past Medical History: Past Medical History:  Diagnosis Date   COPD (chronic obstructive pulmonary disease) (HCC)    H. pylori infection    High cholesterol    Hypertension    Smoker     Meds:  Has not taken any medications this week due to nausea/vomiting  Current Outpatient Medications  Medication Instructions   acetaminophen  (TYLENOL ) 500 mg, 3 times daily PRN   albuterol  (VENTOLIN  HFA) 108 (90 Base) MCG/ACT inhaler 2 puffs, Inhalation, Every 4 hours  PRN   [Paused] amLODipine  (NORVASC ) 5 mg, Oral, Daily   Bismuth /Metronidaz/Tetracyclin (PYLERA) 140-125-125 MG CAPS 3 capsules, Oral, 4 times daily   Nutritional Supplements (ENSURE COMPLETE SHAKE) LIQD 1 each, Oral, 2 times daily   omeprazole  (PRILOSEC ) 20 mg, Oral, 2 times daily before meals   ondansetron  (ZOFRAN -ODT) 4 mg, Oral, Every 6 hours   OXYGEN  3 L, As needed   potassium chloride  SA (KLOR-CON  M) 20 MEQ tablet 40 mEq, Oral,  Once   pravastatin  (PRAVACHOL ) 20 MG tablet TAKE 1 TABLET BY MOUTH EVERYDAY AT BEDTIME   senna-docusate (SENOKOT-S) 8.6-50 MG tablet 1 tablet, Oral, At bedtime PRN   sucralfate  (CARAFATE ) 1 g, Oral, 3 times daily with meals & bedtime   triamcinolone  (KENALOG ) 0.025 % ointment 1 Application, Topical, 2 times daily PRN   umeclidinium bromide  (INCRUSE ELLIPTA ) 62.5 MCG/ACT AEPB 1 puff, Inhalation, Daily    Past Surgical History:  Procedure Laterality Date   BIOPSY  10/20/2023   Procedure: BIOPSY;  Surgeon: Albertus Gordy HERO, MD;  Location: [facility] ENDOSCOPY;  Service: Gastroenterology;;   ESOPHAGOGASTRODUODENOSCOPY (EGD) WITH PROPOFOL  N/A 10/20/2023   Procedure: ESOPHAGOGASTRODUODENOSCOPY (EGD) WITH PROPOFOL ;  Surgeon: Albertus Gordy HERO, MD;  Location: [facility] ENDOSCOPY;  Service: Gastroenterology;  Laterality: N/A;   SALPINGECTOMY N/A    Social:  Living Situation: at home with boyfriend and daughter. Requires some assistance for ADLs PCP:  Tobie Gaines, DO Tobacco: 1 ppd Alcohol: none Drugs: none  Family History: noncontributory  Allergies: Allergies as of 12/05/2023   (No Known Allergies)    Review of Systems: A complete ROS was negative except as per HPI.   OBJECTIVE:   Physical Exam: Blood pressure (!) 95/58, pulse 97, temperature 97.7 F (36.5 C), temperature source Oral, resp. rate 17, height 5' 5 (1.651 m), weight 74.4 kg, SpO2 99%.  Constitutional: chronically ill appearing. In some distress Cardiovascular: regular rate and rhythm, no  m/r/g Pulmonary/Chest: normal work of breathing on room air, lungs clear to auscultation bilaterally Abdominal: uncomfortable to palpation, no guarding or rebound MSK: normal bulk and tone Neurological: alert & oriented x 3, 5/5 strength in bilateral upper and lower extremities  Labs: CBC    Component Value Date/Time   WBC 12.6 (H) 12/06/2023 1157   RBC 3.66 (L) 12/06/2023 1157   HGB 13.1 12/06/2023 1157   HGB 18.7 (H) 09/07/2023 1627   HCT 37.4 12/06/2023 1157   HCT 56.2 (H) 09/07/2023 1627   PLT 134 (L) 12/06/2023 1157   PLT 237 09/07/2023 1627   MCV 102.2 (H) 12/06/2023 1157   MCV 93 09/07/2023 1627   MCH 35.8 (H) 12/06/2023 1157   MCHC 35.0 12/06/2023 1157   RDW 15.0 12/06/2023 1157   RDW 21.5 (H) 09/07/2023 1627   LYMPHSABS 1.7 12/06/2023 1157   MONOABS 0.5 12/06/2023 1157   EOSABS 0.0 12/06/2023 1157   BASOSABS 0.1 12/06/2023 1157    CMP     Component Value Date/Time   NA 128 (L) 12/06/2023 1157   NA 137 09/07/2023 1627   K 3.1 (L) 12/06/2023 1157   CL 90 (L) 12/06/2023 1157   CO2 21 (L) 12/06/2023 1157   GLUCOSE 60 (L) 12/06/2023 1157   BUN 24 (H) 12/06/2023 1157   BUN 10 09/07/2023 1627   CREATININE 1.31 (H) 12/06/2023 1157   CALCIUM  8.4 (L) 12/06/2023 1157   PROT 5.9 (L) 12/06/2023 1157   PROT 7.2 09/07/2023 1627   ALBUMIN 2.5 (L) 12/06/2023 1157   ALBUMIN 3.7 (L) 09/07/2023 1627   AST 17 12/06/2023 1157   ALT 10 12/06/2023 1157   ALKPHOS 81 12/06/2023 1157   BILITOT 1.5 (H) 12/06/2023 1157   BILITOT 0.7 09/07/2023 1627   GFRNONAA 46 (L) 12/06/2023 1157   GFRAA >60 03/28/2020 1607    Imaging: CT ABDOMEN PELVIS W CONTRAST Result Date: 12/06/2023 CLINICAL DATA:  Abdominal pain. Pain medicine given. Bowel obstruction suspected. EXAM: CT ABDOMEN AND PELVIS WITH CONTRAST TECHNIQUE: Multidetector CT imaging of the abdomen and pelvis was performed using the standard protocol following bolus administration of intravenous contrast. RADIATION DOSE REDUCTION:  This exam was performed according to the departmental dose-optimization program which includes automated exposure control, adjustment of the mA and/or kV according to patient size and/or use of iterative reconstruction technique. CONTRAST:  75mL OMNIPAQUE  IOHEXOL  350 MG/ML SOLN COMPARISON:  KUB 12/05/2023, CT chest, abdomen, and pelvis of 10/18/2023, CT abdomen and pelvis 05/02/2022 FINDINGS: Lower chest: A small cystic bleb measuring 4 mm is again seen within the anterior superior middle lobe (axial series 4, image 5). The lung bases are clear. Dense coronary artery calcifications are again seen. Hepatobiliary: Smooth liver contours. The gallbladder is mildly mildly distended, measuring up to approximately 4.1 x 4.6 x 7.9 cm, however no gallbladder wall thickening or surrounding inflammatory fat stranding is seen. No intrahepatic or extrahepatic biliary ductal dilatation. Unchanged low-density adjacent to the falciform ligament, consistent with benign  focal fatty deposition and similar to comparison studies. Pancreas: Mild pancreatic parenchymal atrophy is similar to prior. No pancreatic ductal dilatation. No surrounding inflammatory fat stranding. Spleen: Normal in size without focal abnormality. Adrenals/Urinary Tract: Normal adrenals. The kidneys enhance uniformly and are symmetric in size without hydronephrosis. Unchanged subcentimeter low-density posterior right middle pole low-attenuation lesion, too small to further characterize but statistically most likely a tiny cyst. No follow-up imaging is recommended. Mild viral chronic renal cortical thinning is unchanged. The urinary bladder is only mildly distended but no urinary bladder wall thickening is seen. Stomach/Bowel: High-grade sigmoid diverticulosis is again seen. Mild chronic wall thickening of sigmoid is unchanged from multiple prior CTs and chronic, possibly secondary to prior diverticulitis. No inflammatory fat stranding is seen around the sigmoid  colon to indicate acute diverticulitis. Oral contrast is seen as distal as the rectum. The terminal ileum is unremarkable. The appendix is not confidently identified, however no inflammatory changes are seen around the cecum to indicate secondary signs of acute appendicitis. The appearance is similar to prior. A tubular structure extending inferiorly from the cecum (coronal series 6, images 75 and 76) may represent a blood vessel versus a normal appendix. No dilated loops of bowel are seen to indicate bowel obstruction. Vascular/Lymphatic: High-grade atherosclerotic calcifications. No significant change in bilobed distal abdominal aortic aneurysm inferior to the renal arteries and just proximal to the aortic bifurcation measuring up to 3.2 x 3.1 cm (AP by transverse) when measured on orthogonal views and coronal and sagittal images. There is again moderate mixed calcified and noncalcified plaque. No mesenteric, retroperitoneal, or pelvic pathologically enlarged lymph nodes by CT criteria. Reproductive: The uterus is present.  No adnexal mass is seen. Other: No ventral abdominal wall while hernia. No free air or free fluid is seen within the abdomen or pelvis. Musculoskeletal: Partial sacralization of L5. Grade 1 anterolisthesis of L4 on L5, unchanged. Moderate disc space narrowing of the visualized lower thoracic spine. IMPRESSION: 1. No acute abnormality is seen within the abdomen or pelvis. No evidence of bowel obstruction. 2. High-grade sigmoid diverticulosis without evidence of acute diverticulitis. 3. Extensive atherosclerosis. 4. Unchanged bilobed distal abdominal aortic aneurysm measuring up to 3.2 cm. Recommend follow-up ultrasound every 3 years. (Ref.: J Vasc Surg. 2018; 67:2-77 and J Am Coll Radiol 2013;10(10):789-794.) Electronically Signed   By: Tanda Lyons M.D.   On: 12/06/2023 12:18   DG Chest 2 View Result Date: 12/05/2023 CLINICAL DATA:  chest pain EXAM: CHEST - 2 VIEW COMPARISON:  Chest x-ray  10/06/2022, CT chest 10/18/2023 FINDINGS: The heart and mediastinal contours are within normal limits. Atherosclerotic plaque. Hyperinflation of the lungs. No focal consolidation. No pulmonary edema. No pleural effusion. No pneumothorax. No acute osseous abnormality. IMPRESSION: 1. No active cardiopulmonary disease. 2. Aortic Atherosclerosis (ICD10-I70.0) and Emphysema (ICD10-J43.9). Electronically Signed   By: Morgane  Naveau M.D.   On: 12/05/2023 19:04   DG Abdomen 1 View Result Date: 12/05/2023 CLINICAL DATA:  Constipation, vomiting EXAM: ABDOMEN - 1 VIEW COMPARISON:  None Available. FINDINGS: Moderate to large stool burden throughout the colon. There is a non obstructive bowel gas pattern. No supine evidence of free air. No organomegaly or suspicious calcification. No acute bony abnormality. IMPRESSION: Moderate to large stool burden.  No acute findings. Electronically Signed   By: Franky Crease M.D.   On: 12/05/2023 12:11   ASSESSMENT & PLAN:   DANNIKA HILGEMAN is a 63 y.o. person living with a history of COPD, H pylori gastritis who presented with  nausea and vomiting and admitted for intractable vomiting on hospital day 0  Nausea and vomiting Ulcerative and erosive H Pylori gastritis Malnutrition Recent admission in 12/24 for n/v, anorexia, found to be 2/2 acute ulcerative and erosive gastritis. Biopsies taken during endoscopy positive for H pylori. Patient was started on triple therapy but did not complete due to feeling the medication was making her sick. Outpatient GI note from 1/30 recommends continued zofran  with prn Reglan . May discuss a gastric emptying study to evaluate for gastroparesis, which patient has refused in the past.  - Protonix  40mg  daily - IV Zofran  q6h prn - Ensure protein shakes BID  Elevated creatinine CKD III Creatinine at 1.31 from baseline 1.1-1.2. Likely due to decreased oral intake and GI losses.  - S/p 1L crystalloid in the ED - 500mL bolus  ordered  Hypokalemia K 3.1. Patient refused oral potassium repletion. Ordered IV 10mg  Kcl  COPD On 3L home O2 prn - Home Incruse Ellipta  - Supplemental oxygen  for O2 sat goal 88-92%  Constipation Scheduled Miralax , senna  Hypertension Holding home amlodipine  10mg  due to borderline soft blood pressure  HLD Continue home pravastatin  20mg   Infrarenal abdominal aortic aneurysm Redemonstrated stable abdominal aortic aneurysm measuring 3.2cm. Recommended follow up ultrasound every 3 years  Diet: Clear liquid diet VTE: Lovenox  Code: Full  Prior to Admission Living Arrangement: home Anticipated Discharge Location: pending PT/OT eval Barriers to Discharge: pending medical stability  Signed: Francella Rogue, MD Internal Medicine Resident PGY-1  12/06/2023, 1:54 PM

## 2023-12-06 NOTE — ED Notes (Signed)
 Cbg 62 Kingsley MD, aware via secure chat.

## 2023-12-06 NOTE — ED Notes (Signed)
 Patient moved to triage for reassessment by PA and RN.

## 2023-12-06 NOTE — ED Provider Triage Note (Addendum)
 Emergency Medicine Provider Triage Evaluation Note  Kim [name] , a 63 y.o. female  was evaluated in triage.  Pt seen and reevaluation in triage due to long wait time.  Her daughter became agitated and was actually asked to leave the ER due to her behavior.  Patient has a history of peptic ulcer disease.  She states she has not been able to hold anything down for last 8 days.  She has had no change in her abdominal pain.  Patient states I just need a bed and I need to get admitted.  I reviewed the patient's labs which showed no significant change.  Review of Systems     I reassessed her abdomen which is nontender nondistended and nontympanic.   Medical Decision Making  Medically screening exam initiated at 3:56 AM.  Appropriate orders placed.  Kim [name] was informed that the remainder of the evaluation will be completed by another provider, this initial triage assessment does not replace that evaluation, and the importance of remaining in the ED until their evaluation is complete.  Given Zofran , GI cocktail.  To obtain a urine.  Will bring her to the bathroom and put a hat in the toilet for collection.      4:24 AM Patient is feeling better after GI cocktail, tolerating fluids. She has not vomited.       Arloa Chroman, PA-C 12/06/23 0425

## 2023-12-06 NOTE — Hospital Course (Addendum)
 Dyspepsia H Pylori gastritis Patient admitted on 2/5 from [facility] with persistent nausea and vomiting. She was diagnosed with H pylori gastritis bu EGD in January but was not able to complete treatment due to size of pills. GI was consulted on admission. Dyspepsia thought to be multi factorial in setting of H. Pylori gastritis, chronic constipation and possible gastroparesis. Constipation improved with bowel regimen, she does take Vicodin occasionally at home. She initially was started on IV H pylori treatment, but was able to be transitioned to PO H pylori prior to discharge. She was started on reglan  prior to discharge for empiric treatment of gastroparesis.  Urinary retention She had in and out cath 2 times while admitted. This resolved with discontinuing scopalomine patch.  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 She was taking her partners vicodin at home for pain. She was started on cymbalta  but did not note improvement and family concerned about her being on medication for depression. Previously she had side effects with double vision when taking gabapentin . She was open to trying lyrica  and this was started during admission.  Constipation Improved with bowel regimen.

## 2023-12-06 NOTE — ED Notes (Signed)
 Patient reassessed and pain medication given. New vitals obtained. Patient assisted to restroom and urine sample obtained. Patient moved back to lobby.

## 2023-12-06 NOTE — ED Provider Triage Note (Signed)
 Emergency Medicine Provider Triage Evaluation Note  Kim [name] , a 63 y.o. female  was evaluated in triage.  Pt complains of abdominal pain.  Reevaluated today in triage.  Still reporting abdominal pain and requesting bed.  No current nausea or vomiting.  She has not vomited since the last dose of Zofran  earlier this morning.  Abdomen is soft without rebound rigidity or guarding but tender in the epigastric region.  Will obtain CT abdomen pelvis repeat lab work and give IV fluids  Review of Systems  Positive: As above Negative: As above  Physical Exam  BP 99/60 (BP Location: Left Arm)   Pulse 78   Temp (!) 97.5 F (36.4 C) (Oral)   Resp 20   Ht 5' 5 (1.651 m)   Wt 74.4 kg   SpO2 94%   BMI 27.29 kg/m  Gen:   Awake, no distress Resp:  Normal effort  MSK:   Moves extremities without difficulty  Other:  Epigastric abdominal tenderness no rebound rigidity or guarding  Medical Decision Making  Medically screening exam initiated at 8:55 AM.  Appropriate orders placed.  Kim [name] was informed that the remainder of the evaluation will be completed by another provider, this initial triage assessment does not replace that evaluation, and the importance of remaining in the ED until their evaluation is complete.  Remainder of care per ED team   Kim Ozell LABOR, DO 12/06/23 [id number]

## 2023-12-07 DIAGNOSIS — K5909 Other constipation: Secondary | ICD-10-CM

## 2023-12-07 DIAGNOSIS — A048 Other specified bacterial intestinal infections: Secondary | ICD-10-CM

## 2023-12-07 DIAGNOSIS — Z8711 Personal history of peptic ulcer disease: Secondary | ICD-10-CM | POA: Diagnosis not present

## 2023-12-07 DIAGNOSIS — N1831 Chronic kidney disease, stage 3a: Secondary | ICD-10-CM | POA: Diagnosis not present

## 2023-12-07 DIAGNOSIS — R634 Abnormal weight loss: Secondary | ICD-10-CM

## 2023-12-07 DIAGNOSIS — I129 Hypertensive chronic kidney disease with stage 1 through stage 4 chronic kidney disease, or unspecified chronic kidney disease: Secondary | ICD-10-CM | POA: Diagnosis not present

## 2023-12-07 DIAGNOSIS — Z9981 Dependence on supplemental oxygen: Secondary | ICD-10-CM | POA: Diagnosis not present

## 2023-12-07 DIAGNOSIS — J9610 Chronic respiratory failure, unspecified whether with hypoxia or hypercapnia: Secondary | ICD-10-CM | POA: Diagnosis not present

## 2023-12-07 DIAGNOSIS — R112 Nausea with vomiting, unspecified: Secondary | ICD-10-CM

## 2023-12-07 DIAGNOSIS — E876 Hypokalemia: Secondary | ICD-10-CM | POA: Diagnosis present

## 2023-12-07 DIAGNOSIS — I700 Atherosclerosis of aorta: Secondary | ICD-10-CM

## 2023-12-07 DIAGNOSIS — R1013 Epigastric pain: Secondary | ICD-10-CM

## 2023-12-07 DIAGNOSIS — E871 Hypo-osmolality and hyponatremia: Secondary | ICD-10-CM | POA: Diagnosis not present

## 2023-12-07 DIAGNOSIS — K828 Other specified diseases of gallbladder: Secondary | ICD-10-CM | POA: Diagnosis not present

## 2023-12-07 DIAGNOSIS — K5732 Diverticulitis of large intestine without perforation or abscess without bleeding: Secondary | ICD-10-CM | POA: Diagnosis not present

## 2023-12-07 DIAGNOSIS — K2970 Gastritis, unspecified, without bleeding: Secondary | ICD-10-CM | POA: Diagnosis not present

## 2023-12-07 DIAGNOSIS — I7143 Infrarenal abdominal aortic aneurysm, without rupture: Secondary | ICD-10-CM | POA: Diagnosis present

## 2023-12-07 DIAGNOSIS — E878 Other disorders of electrolyte and fluid balance, not elsewhere classified: Secondary | ICD-10-CM | POA: Diagnosis not present

## 2023-12-07 DIAGNOSIS — K3580 Unspecified acute appendicitis: Secondary | ICD-10-CM | POA: Diagnosis not present

## 2023-12-07 DIAGNOSIS — F17210 Nicotine dependence, cigarettes, uncomplicated: Secondary | ICD-10-CM | POA: Diagnosis present

## 2023-12-07 DIAGNOSIS — E860 Dehydration: Secondary | ICD-10-CM | POA: Diagnosis not present

## 2023-12-07 DIAGNOSIS — E46 Unspecified protein-calorie malnutrition: Secondary | ICD-10-CM | POA: Diagnosis not present

## 2023-12-07 DIAGNOSIS — I714 Abdominal aortic aneurysm, without rupture, unspecified: Secondary | ICD-10-CM | POA: Diagnosis not present

## 2023-12-07 DIAGNOSIS — L89151 Pressure ulcer of sacral region, stage 1: Secondary | ICD-10-CM | POA: Diagnosis not present

## 2023-12-07 DIAGNOSIS — J449 Chronic obstructive pulmonary disease, unspecified: Secondary | ICD-10-CM | POA: Diagnosis not present

## 2023-12-07 DIAGNOSIS — M792 Neuralgia and neuritis, unspecified: Secondary | ICD-10-CM | POA: Diagnosis not present

## 2023-12-07 DIAGNOSIS — Z8249 Family history of ischemic heart disease and other diseases of the circulatory system: Secondary | ICD-10-CM | POA: Diagnosis not present

## 2023-12-07 DIAGNOSIS — K5900 Constipation, unspecified: Secondary | ICD-10-CM | POA: Diagnosis not present

## 2023-12-07 DIAGNOSIS — G8929 Other chronic pain: Secondary | ICD-10-CM | POA: Diagnosis present

## 2023-12-07 DIAGNOSIS — K3184 Gastroparesis: Secondary | ICD-10-CM | POA: Diagnosis not present

## 2023-12-07 DIAGNOSIS — Z79899 Other long term (current) drug therapy: Secondary | ICD-10-CM | POA: Diagnosis not present

## 2023-12-07 DIAGNOSIS — D696 Thrombocytopenia, unspecified: Secondary | ICD-10-CM | POA: Diagnosis not present

## 2023-12-07 DIAGNOSIS — B9681 Helicobacter pylori [H. pylori] as the cause of diseases classified elsewhere: Secondary | ICD-10-CM | POA: Diagnosis not present

## 2023-12-07 LAB — GLUCOSE, CAPILLARY
Glucose-Capillary: 137 mg/dL — ABNORMAL HIGH (ref 70–99)
Glucose-Capillary: 58 mg/dL — ABNORMAL LOW (ref 70–99)
Glucose-Capillary: 77 mg/dL (ref 70–99)
Glucose-Capillary: 85 mg/dL (ref 70–99)
Glucose-Capillary: 87 mg/dL (ref 70–99)
Glucose-Capillary: 87 mg/dL (ref 70–99)
Glucose-Capillary: 88 mg/dL (ref 70–99)

## 2023-12-07 LAB — PHOSPHORUS
Phosphorus: 2.2 mg/dL — ABNORMAL LOW (ref 2.5–4.6)
Phosphorus: 2 mg/dL — ABNORMAL LOW (ref 2.5–4.6)

## 2023-12-07 LAB — CBC
HCT: 39.4 % (ref 36.0–46.0)
Hemoglobin: 13.7 g/dL (ref 12.0–15.0)
MCH: 34.7 pg — ABNORMAL HIGH (ref 26.0–34.0)
MCHC: 34.8 g/dL (ref 30.0–36.0)
MCV: 99.7 fL (ref 80.0–100.0)
Platelets: 123 10*3/uL — ABNORMAL LOW (ref 150–400)
RBC: 3.95 MIL/uL (ref 3.87–5.11)
RDW: 14.8 % (ref 11.5–15.5)
WBC: 8.8 10*3/uL (ref 4.0–10.5)
nRBC: 0 % (ref 0.0–0.2)

## 2023-12-07 LAB — COMPREHENSIVE METABOLIC PANEL
ALT: 13 U/L (ref 0–44)
AST: 24 U/L (ref 15–41)
Albumin: 2.7 g/dL — ABNORMAL LOW (ref 3.5–5.0)
Alkaline Phosphatase: 86 U/L (ref 38–126)
Anion gap: 13 (ref 5–15)
BUN: 20 mg/dL (ref 8–23)
CO2: 26 mmol/L (ref 22–32)
Calcium: 8.7 mg/dL — ABNORMAL LOW (ref 8.9–10.3)
Chloride: 91 mmol/L — ABNORMAL LOW (ref 98–111)
Creatinine, Ser: 1.21 mg/dL — ABNORMAL HIGH (ref 0.44–1.00)
GFR, Estimated: 50 mL/min — ABNORMAL LOW (ref >60)
Glucose, Bld: 68 mg/dL — ABNORMAL LOW (ref 70–99)
Potassium: 3.3 mmol/L — ABNORMAL LOW (ref 3.5–5.1)
Sodium: 130 mmol/L — ABNORMAL LOW (ref 135–145)
Total Bilirubin: 1.1 mg/dL (ref 0.0–1.2)
Total Protein: 6.1 g/dL — ABNORMAL LOW (ref 6.5–8.1)

## 2023-12-07 MED ORDER — OXYCODONE HCL 5 MG PO TABS
5.0000 mg | ORAL_TABLET | Freq: Once | ORAL | Status: AC
  Administered 2023-12-07: 5 mg via ORAL
  Filled 2023-12-07: qty 1

## 2023-12-07 MED ORDER — POTASSIUM CHLORIDE 20 MEQ PO PACK
40.0000 meq | PACK | Freq: Once | ORAL | Status: AC
  Administered 2023-12-07: 40 meq via ORAL
  Filled 2023-12-07: qty 2

## 2023-12-07 MED ORDER — POTASSIUM & SODIUM PHOSPHATES 280-160-250 MG PO PACK
2.0000 | PACK | Freq: Once | ORAL | Status: AC
  Administered 2023-12-07: 2 via ORAL
  Filled 2023-12-07: qty 2

## 2023-12-07 MED ORDER — SODIUM CHLORIDE 0.9 % IV SOLN: 8.0000 mg | Freq: Three times a day (TID) | INTRAVENOUS | Status: DC | PRN

## 2023-12-07 MED ORDER — DULOXETINE HCL 30 MG PO CPEP
30.0000 mg | ORAL_CAPSULE | Freq: Every day | ORAL | Status: DC
  Administered 2023-12-07 – 2023-12-10 (×4): 30 mg via ORAL
  Filled 2023-12-07 (×4): qty 1

## 2023-12-07 MED ORDER — LACTATED RINGERS IV BOLUS
500.0000 mL | Freq: Once | INTRAVENOUS | Status: AC
  Administered 2023-12-07: 500 mL via INTRAVENOUS

## 2023-12-07 MED ORDER — DEXTROSE 50 % IV SOLN
50.0000 mL | Freq: Once | INTRAVENOUS | Status: AC
  Administered 2023-12-07: 50 mL via INTRAVENOUS
  Filled 2023-12-07: qty 50

## 2023-12-07 NOTE — Consult Note (Addendum)
 WOC Nurse Consult Note: Consult requested for sacrum Stage 1 pressure injury.  This was performed remotely after review of progress notes. This was present on admission according to the bedside nurse's wound care flow sheet. These types of wounds can be treated independently by the bedside nurse using the skin care order set in Epic.  Please re-consult if further assistance is needed.  Thank-you,  Stephane Fought MSN, RN, CWOCN, [place], CNS [phone]

## 2023-12-07 NOTE — Progress Notes (Signed)
                  Subjective:   Summary: 63 year old woman with history of COPD on 3L prn, HTN, CKD, recent diagnosis of H pylori gastritis, presenting with nausea and vomiting  Patient still endorsing some nausea, no vomiting. She would like to eat breakfast today. Daughter at bedside was very agitated about her mother's chronic leg pain, demanding Vicodin. She also feels strongly that her mother's H pylori has not been adequately treated.   Objective:  Vital signs in last 24 hours: Vitals:   12/06/23 2016 12/06/23 2102 12/07/23 0013 12/07/23 0800  BP: (!) 102/30 108/65 119/68 126/75  Pulse: 80 88 75 75  Resp: 20  19   Temp: (!) 97.4 F (36.3 C) 98.2 F (36.8 C) (!) 97.5 F (36.4 C)   TempSrc:      SpO2: 92% 97% 94% 96%  Weight:      Height:       Supplemental O2: Nasal Cannula SpO2: 96 % O2 Flow Rate (L/min): 2 L/min  Physical Exam:  Constitutional: chronically ill appearing, mild distress Cardiovascular: RRR, no murmurs, rubs or gallops Pulmonary/Chest: normal work of breathing on room air, lungs clear to auscultation bilaterally Abdominal: soft, mildly tender to palpation Neuro: 5/5 strength, no focal deficit noted  Assessment/Plan:   Nausea, vomiting Ulcerative and erosive gastritis Patient continues to have some nausea, feels improved from yesterday. No episodes of vomiting since admission. She feels she could eat something solid for breakfast this morning. Daughter at bedside feels her mother took much less of the 9/10 doses of Pylera prescribed for her H Pylori.  - GI consulted, appreciate recommendations - oral Protonix  40mg  daily, IV Zofran  q6h prn - Regular diet - Ensure protein shakes BID  CKD III Elevated creatinine, resolved Creatinine 1.21, at baseline. Resuming regular diet as tolerated.   Hypokalemia K 3.3, replete with 40 mEq Kcl  Hyponatremia Na 130 today. This is a chronic issue for her, likely related to chronic malnutrition.  CTM  Neuropathic leg pain Has been on gabapentin  in the past, recently discontinued as it was reportedly causing double vision.  - Tylenol  650mg  q6h prn - Duloxetine  30mg  daily  Stage I pressure injury Present on admission. Per WOC, skin care order set will provide sufficient care  COPD: Home incruse Ellipta , O2 sat goal 88-92% Constipation: scheduled Miralax  Hypertension: Holding home amlodipine  HLD: Home pravastatin  20mg  Infrarenal abdominal aortic aneurysm: stable, abdominal ultrasound q3 years  Diet: Regular VTE: Lovenox  Code: Full  Dispo: Anticipated discharge to Home pending medical stability.   Kim Burnet, MD PGY-1 Internal Medicine Resident Pager Number [phone] Please contact the on call pager after 5 pm and on weekends at [phone].

## 2023-12-07 NOTE — Plan of Care (Signed)
 0700: When RN came onto shift pt daughter at bedside, pt daughter upset about pt care, pt daughter originally asked for a nurse exchange and this nurse took over care.  0800: RN was notified by NA that pt Blood sugar 58. RN came to room and pt daughter upset and stated to RN that MD had came to room and acted like they were going to possibly discharge patient today. Pt daughter concerned that pt H.Pylori is not being treated correctly. Pt daughter verbally aggressive at times towards staff/ about staff. Pt Daughter yelled at RN  if they discharge my mom and she goes home and dies, I will sue this hospital. Pt daughter left home to retrieve pt medications but stated RN she wanted to see the MD again when she comes back.  RN treated pt blood sugar by giving pt juice and amp of D50 IV. Pt BS came up to 137. Pt order changed by MD to regular diet, RN ordered food for pt, but pt not very hungry.   RN notified Dr. Reyes Fenton MD and team of situation, Per MD request RN notified Risk Management  When pt Daughter came back RN notified MD per daughter request that daughter is wanting to speak with them. MD and team notified RN that they would come to see pt and daughter after GI comes by and makes recommendations. When RN updated daughter on this pt daughter requesting to speak with patient advocate. Unit secretary called for patient advocate but was told by operator that there is no patient advocate at this hospital but the social worker can talk with pt, RN spoke with social worker and social worker planning to speak with daughter.  Pt seen by GI MD during shift. GI MD spoke with pt daughter and updated her. Pt had x3 loose large BMs during shift and x1 vomit episode. Pt given Zofran  IV for nausea and vomiting. Pt able to rest during shift later. Pt pain more controlled during shift with pt sleeping at times.

## 2023-12-07 NOTE — Plan of Care (Signed)

## 2023-12-07 NOTE — Consult Note (Addendum)
 Consultation  Referring Provider:     Dr. Reyes Fenton Primary Care Physician:  Tobie Gaines, DO Primary Gastroenterologist:      Dr. Gordy Starch Reason for Consultation:       H. pylori gastritis Persistent nausea and vomiting Constipation Electrolyte abnormalities-hyponatremia and hypokalemia Weight loss         HPI:   Kim [name] is a 63 y.o. female with past medical history of obesity, hypertension, hyperlipidemia, infrarenal AAA, chronic tobacco use, chronic respiratory failure on home oxygen  3L [place], COPD, OSA, CKD stage II and polyneuropathy who is seen in inpatient consultation at the request of Dr. Reyes Fenton for persistent symptoms of nausea and vomiting in the setting of a diagnosis of H. pylori gastritis.  In speaking with [name] and her family as well as reviewing her prior medical records, she presented with nausea, vomiting and loss of appetite and 09/2023.  She was seen by her PCP and prescribed ondansetron  and Protonix  40 mg orally daily.  Her symptoms unfortunately progressed and she was offered inpatient hospitalization in December 2024 for which she initially declined.  She was started on mirtazapine  15 mg p.o. nightly for appetite stimulation.  She was ultimately admitted to [facility] 10/18/2023 - 10/20/2023 for evaluation of symptoms.  Laboratory studies (thyroid  profile, cortisol ) and CT imaging were unremarkable.  EGD showed acute ulcerative and erosive gastritis for which biopsies were positive for H. pylori.  She was prescribed Pylera for treatment of H. Pylori.  Per her daughter, she was unable to tolerate treatment and has not really taken any of the tablets.  Her daughter also notes that the tablets are too big for her mother to swallow.  She was last seen in GI clinic on 11/30/2023 at which time she reported no improvement in nausea and vomiting and was unable to keep solid food down.  Labs were noteworthy for hypokalemia.  She was given  omeprazole  20 mg orally daily and ondansetron  4 mg every 6 hours.  She was readmitted to [facility] on 12/06/2023 with ongoing symptoms.  Reports chronic nausea and intermittent vomiting when she tries to eat.  Daughter states she has not had solid food for several days and is worried about dehydration.  No hematemesis or coffee-ground emesis.  She does not think that Zofran  has been helpful.Records suggest that a gastric emptying scan was previously considered but not performed.  No documented history of gastroparesis.  Notes that she has chronic constipation and has not had a bowel movement for 1 week.  This has been an ongoing issue.  Occasionally uses stool softeners at home.  Has used suppositories but no enemas.  She has never had a colonoscopy.    Family is not endorsing focal neurologic deficits  Family history of gastrointestinal disorders for colon cancer.   Past Medical History:  Diagnosis Date   COPD (chronic obstructive pulmonary disease) (HCC)    H. pylori infection    High cholesterol    Hypertension    Smoker     Past Surgical History:  Procedure Laterality Date   BIOPSY  10/20/2023   Procedure: BIOPSY;  Surgeon: Starch Gordy HERO, MD;  Location: [facility] ENDOSCOPY;  Service: Gastroenterology;;   ESOPHAGOGASTRODUODENOSCOPY (EGD) WITH PROPOFOL  N/A 10/20/2023   Procedure: ESOPHAGOGASTRODUODENOSCOPY (EGD) WITH PROPOFOL ;  Surgeon: Starch Gordy HERO, MD;  Location: [facility] ENDOSCOPY;  Service: Gastroenterology;  Laterality: N/A;   SALPINGECTOMY N/A     Family History  Problem Relation Age of  Onset   CAD Mother    Colon cancer Neg Hx    Esophageal cancer Neg Hx      Social History   Tobacco Use   Smoking status: Every Day    Current packs/day: 1.00    Average packs/day: 1 pack/day for 40.0 years (40.0 ttl pk-yrs)    Types: Cigarettes    Start date: 12/05/1983   Smokeless tobacco: Never  Vaping Use   Vaping status: Never Used  Substance Use Topics   Alcohol use: Not Currently    Drug use: Not Currently    Prior to Admission medications   Medication Sig Start Date End Date Taking? Authorizing Provider  acetaminophen  (TYLENOL ) 500 MG tablet Take 500 mg by mouth 3 (three) times daily as needed (pain).    [provider]  albuterol  (VENTOLIN  HFA) 108 (90 Base) MCG/ACT inhaler Inhale 2 puffs into the lungs every 4 (four) hours as needed for wheezing or shortness of breath. 10/07/22   Addie Perkins, DO  amLODipine  (NORVASC ) 5 MG tablet Take 1 tablet (5 mg total) by mouth daily. Patient not taking: Reported on 12/01/2023 08/12/22 10/11/22  Tobie Gaines, DO  Bismuth /Metronidaz/Tetracyclin [facility]) 140-125-125 MG CAPS Take 3 capsules by mouth 4 (four) times daily for 10 days. 10/23/23 11/02/23  Atway, Rayann N, DO  Nutritional Supplements (ENSURE COMPLETE SHAKE) LIQD Take 1 each by mouth in the morning and at bedtime. 10/20/23   Kandis Perkins, DO  omeprazole  (PRILOSEC ) 20 MG capsule Take 1 capsule (20 mg total) by mouth 2 (two) times daily before a meal for 10 days. 10/31/23 11/10/23  Zheng, Michael, DO  ondansetron  (ZOFRAN -ODT) 4 MG disintegrating tablet Take 1 tablet (4 mg total) by mouth every 6 (six) hours. 11/30/23   May, Deanna J, NP  OXYGEN  Inhale 3 L into the lungs as needed.    [provider]  potassium chloride  SA (KLOR-CON  M) 20 MEQ tablet Take 2 tablets (40 mEq total) by mouth once for 1 dose. 11/30/23 11/30/23  May, Deanna J, NP  pravastatin  (PRAVACHOL ) 20 MG tablet TAKE 1 TABLET BY MOUTH EVERYDAY AT BEDTIME Patient taking differently: Take 20 mg by mouth at bedtime. 12/13/22   Tobie Gaines, DO  senna-docusate (SENOKOT-S) 8.6-50 MG tablet Take 1 tablet by mouth at bedtime as needed for mild constipation. 05/09/22   Elnora Ip, MD  sucralfate  (CARAFATE ) 1 GM/10ML suspension Take 10 mLs (1 g total) by mouth 4 (four) times daily -  with meals and at bedtime. 10/20/23   Kandis Perkins, DO  triamcinolone  (KENALOG ) 0.025 % ointment Apply 1 Application  topically 2 (two) times daily as needed (Itching). 06/17/22   Addie Perkins, DO  umeclidinium bromide  (INCRUSE ELLIPTA ) 62.5 MCG/ACT AEPB Inhale 1 puff into the lungs daily. 03/07/23 03/06/24  Nguyen, Quan, DO    Current Facility-Administered Medications  Medication Dose Route Frequency Provider Last Rate Last Admin   acetaminophen  (TYLENOL ) tablet 650 mg  650 mg Oral Q6H PRN Zheng, Michael, DO   650 mg at 12/07/23 0518   Or   acetaminophen  (TYLENOL ) suppository 650 mg  650 mg Rectal Q6H PRN Zheng, Michael, DO       capsaicin  (ZOSTRIX) 0.025 % cream   Topical PRN Tawkaliyar, Roya, DO   Given at 12/07/23 0558   DULoxetine  (CYMBALTA ) DR capsule 30 mg  30 mg Oral Daily Francella Rogue, MD   30 mg at 12/07/23 0931   enoxaparin  (LOVENOX ) injection 40 mg  40 mg Subcutaneous Q24H Zheng, Michael, DO  40 mg at 12/07/23 1316   feeding supplement (ENSURE ENLIVE / ENSURE PLUS) liquid 237 mL  237 mL Oral BID BM Elicia Sharper, DO   237 mL at 12/07/23 1309   nicotine  (NICODERM CQ  - dosed in mg/24 hours) patch 14 mg  14 mg Transdermal Daily Tawkaliyar, Roya, DO   14 mg at 12/07/23 0836   ondansetron  (ZOFRAN ) 8 mg in sodium chloride  0.9 % 50 mL IVPB  8 mg Intravenous Q8H PRN Francella Rogue, MD       pantoprazole  (PROTONIX ) EC tablet 40 mg  40 mg Oral Daily Zheng, Michael, DO   40 mg at 12/07/23 9163   polyethylene glycol (MIRALAX  / GLYCOLAX ) packet 17 g  17 g Oral Daily Francella Rogue, MD   17 g at 12/07/23 9163   pravastatin  (PRAVACHOL ) tablet 20 mg  20 mg Oral QHS Zheng, Michael, DO       senna (SENOKOT) tablet 8.6 mg  1 tablet Oral Daily Elicia Sharper, DO   8.6 mg at 12/07/23 9163   umeclidinium bromide  (INCRUSE ELLIPTA ) 62.5 MCG/ACT 1 puff  1 puff Inhalation Daily Elicia Sharper, DO   1 puff at 12/07/23 9158    Allergies as of 12/05/2023   (No Known Allergies)     Review of Systems:    Constitutional: No weight loss, fever, chills, weakness or fatigue HEENT: Eyes: No change in vision               Ears, Nose,  Throat:  No change in hearing or congestion Skin: No rash or itching Cardiovascular: No chest pain, chest pressure or palpitations   Respiratory: No SOB or cough Gastrointestinal: See HPI and otherwise negative Genitourinary: No dysuria or change in urinary frequency Neurological: No headache, dizziness or syncope Musculoskeletal: No new muscle or joint pain Hematologic: No bleeding or bruising Psychiatric: No history of depression or anxiety     Physical Exam:  Vital signs in last 24 hours: Temp:  [97.4 F (36.3 C)-98.2 F (36.8 C)] 97.9 F (36.6 C) (02/06 1307) Pulse Rate:  [70-88] 70 (02/06 1307) Resp:  [18-20] 18 (02/06 1307) BP: (102-126)/(30-75) 111/59 (02/06 1307) SpO2:  [92 %-97 %] 97 % (02/06 1307) Last BM Date : 12/07/23 General:   Sleepy, chronically ill-appearing woman resting quietly in bed Head:  Normocephalic and atraumatic. Eyes:   Anicteric sclerae Lungs: Overall clear to auscultation with slightly diminished breath sounds at bases Heart: Normal S1, S2.  Rate and rhythm Abdomen:  Soft, nondistended, nontender. No rebound or guarding. Normal bowel sounds. No appreciable masses or hepatomegaly. Rectal:  Not performed.  Msk:  Symmetrical without gross deformities. Peripheral pulses intact.  Extremities:  Without edema, no deformity or joint abnormality. Normal ROM, normal sensation. Neurologic: Somewhat sleepy but rouses to questions Skin:   Dry and intact without significant lesions or rashes.   LAB RESULTS: Recent Labs    12/05/23 1835 12/06/23 1157 12/07/23 0726  WBC 17.4* 12.6* 8.8  HGB 15.8* 13.1 13.7  HCT 44.9 37.4 39.4  PLT 183 134* 123*   BMET Recent Labs    12/05/23 1835 12/06/23 1157 12/07/23 0726  NA 131* 128* 130*  K 4.0 3.1* 3.3*  CL 87* 90* 91*  CO2 26 21* 26  GLUCOSE 82 60* 68*  BUN 21 24* 20  CREATININE 1.45* 1.31* 1.21*  CALCIUM  9.7 8.4* 8.7*   LFT Recent Labs    12/07/23 0726  PROT 6.1*  ALBUMIN 2.7*  AST 24  ALT 13  ALKPHOS 86  BILITOT 1.1   PT/INR No results for input(s): LABPROT, INR in the last 72 hours.  RADIOGRAPHIC STUDIES: CT ABDOMEN PELVIS W CONTRAST Result Date: 12/06/2023 CLINICAL DATA:  Abdominal pain. Pain medicine given. Bowel obstruction suspected. EXAM: CT ABDOMEN AND PELVIS WITH CONTRAST TECHNIQUE: Multidetector CT imaging of the abdomen and pelvis was performed using the standard protocol following bolus administration of intravenous contrast. RADIATION DOSE REDUCTION: This exam was performed according to the departmental dose-optimization program which includes automated exposure control, adjustment of the mA and/or kV according to patient size and/or use of iterative reconstruction technique. CONTRAST:  75mL OMNIPAQUE  IOHEXOL  350 MG/ML SOLN COMPARISON:  KUB 12/05/2023, CT chest, abdomen, and pelvis of 10/18/2023, CT abdomen and pelvis 05/02/2022 FINDINGS: Lower chest: A small cystic bleb measuring 4 mm is again seen within the anterior superior middle lobe (axial series 4, image 5). The lung bases are clear. Dense coronary artery calcifications are again seen. Hepatobiliary: Smooth liver contours. The gallbladder is mildly mildly distended, measuring up to approximately 4.1 x 4.6 x 7.9 cm, however no gallbladder wall thickening or surrounding inflammatory fat stranding is seen. No intrahepatic or extrahepatic biliary ductal dilatation. Unchanged low-density adjacent to the falciform ligament, consistent with benign focal fatty deposition and similar to comparison studies. Pancreas: Mild pancreatic parenchymal atrophy is similar to prior. No pancreatic ductal dilatation. No surrounding inflammatory fat stranding. Spleen: Normal in size without focal abnormality. Adrenals/Urinary Tract: Normal adrenals. The kidneys enhance uniformly and are symmetric in size without hydronephrosis. Unchanged subcentimeter low-density posterior right middle pole low-attenuation lesion, too small to further  characterize but statistically most likely a tiny cyst. No follow-up imaging is recommended. Mild viral chronic renal cortical thinning is unchanged. The urinary bladder is only mildly distended but no urinary bladder wall thickening is seen. Stomach/Bowel: High-grade sigmoid diverticulosis is again seen. Mild chronic wall thickening of sigmoid is unchanged from multiple prior CTs and chronic, possibly secondary to prior diverticulitis. No inflammatory fat stranding is seen around the sigmoid colon to indicate acute diverticulitis. Oral contrast is seen as distal as the rectum. The terminal ileum is unremarkable. The appendix is not confidently identified, however no inflammatory changes are seen around the cecum to indicate secondary signs of acute appendicitis. The appearance is similar to prior. A tubular structure extending inferiorly from the cecum (coronal series 6, images 75 and 76) may represent a blood vessel versus a normal appendix. No dilated loops of bowel are seen to indicate bowel obstruction. Vascular/Lymphatic: High-grade atherosclerotic calcifications. No significant change in bilobed distal abdominal aortic aneurysm inferior to the renal arteries and just proximal to the aortic bifurcation measuring up to 3.2 x 3.1 cm (AP by transverse) when measured on orthogonal views and coronal and sagittal images. There is again moderate mixed calcified and noncalcified plaque. No mesenteric, retroperitoneal, or pelvic pathologically enlarged lymph nodes by CT criteria. Reproductive: The uterus is present.  No adnexal mass is seen. Other: No ventral abdominal wall while hernia. No free air or free fluid is seen within the abdomen or pelvis. Musculoskeletal: Partial sacralization of L5. Grade 1 anterolisthesis of L4 on L5, unchanged. Moderate disc space narrowing of the visualized lower thoracic spine. IMPRESSION: 1. No acute abnormality is seen within the abdomen or pelvis. No evidence of bowel obstruction.  2. High-grade sigmoid diverticulosis without evidence of acute diverticulitis. 3. Extensive atherosclerosis. 4. Unchanged bilobed distal abdominal aortic aneurysm measuring up to 3.2 cm. Recommend follow-up ultrasound every 3 years. (Ref.: J Vasc Surg. 2018;  67:2-77 and J Am Coll Radiol 2013;10(10):789-794.) Electronically Signed   By: Tanda Lyons M.D.   On: 12/06/2023 12:18   DG Chest 2 View Result Date: 12/05/2023 CLINICAL DATA:  chest pain EXAM: CHEST - 2 VIEW COMPARISON:  Chest x-ray 10/06/2022, CT chest 10/18/2023 FINDINGS: The heart and mediastinal contours are within normal limits. Atherosclerotic plaque. Hyperinflation of the lungs. No focal consolidation. No pulmonary edema. No pleural effusion. No pneumothorax. No acute osseous abnormality. IMPRESSION: 1. No active cardiopulmonary disease. 2. Aortic Atherosclerosis (ICD10-I70.0) and Emphysema (ICD10-J43.9). Electronically Signed   By: Morgane  Naveau M.D.   On: 12/05/2023 19:04   PREVIOUS ENDOSCOPIES:            EGD 10/20/2023   A. STOMACH, BIOPSY:  Marked chronic active gastritis with lymphoid aggregates  Helicobacter present  Negative for intestinal metaplasia, dysplasia and carcinoma    Impression / Plan:  It is my clinical impression that Kim [name] is a 63 year old woman with past medical history of obesity, hypertension, hyperlipidemia, infrarenal AAA, chronic tobacco use, chronic respiratory failure on home oxygen  3L [place], COPD, OSA, CKD stage II and polyneuropathy who presents with:  1.  H. pylori gastritis 2.  Persistent nausea and vomiting 3.  Constipation 4.  Electrolyte abnormalities-hyponatremia and hypokalemia 5.  Weight loss  I reviewed with KimMarschner's family that her constellation of symptoms are likely multifactorial including: H. pylori gastritis, chronic constipation potentially contributing to upper GI nausea and discomfort and consideration of possible delayed gastric emptying or generalized gastrointestinal  dysmotility.  Her imaging tests have not shown any evidence of malignancy or intestinal obstruction.  Previous laboratory testing has ruled out thyroid  disease and adrenal insufficiency.  She has not had CNS imaging but is not necessarily endorsing focal neurologic deficits to suggest a CNS etiology of her persistent nausea and vomiting.  I suggested to her family focusing on symptomatic management of her nausea and vomiting today in an effort to ameliorate her symptoms to a point that she could tolerate beginning H. pylori treatment.  Her daughter expressed concern that her mother would not be able to swallow the Pylera capsules due to size.  Reviewed that we could discuss with pharmacy if there are ways to modify the medication -crushing or changing to liquid components.  I have also recommended instituting a bowel regimen as her chronic constipation could certainly be contributing to some of her upper GI tract discomfort.  Given her nausea and vomiting she would be unlikely to tolerate MiraLAX  at this juncture but I have recommended stimulant laxatives in the form of Dulcolax and potentially suppositories.  I broached with her family the possibility of a gastric emptying scan during this admission when her nausea control has reached a point that she could tolerate the toast and eggs typically administered with solid-phase GES.  Her family seemed interested in this possibility.  If she is unable to tolerate solid-phase GES empiric medication trials with Reglan  or erythromycin may be options.  Recommendations: Initiate antiemetic treatment and a scheduled dosing fashion with ondansetron  8 mg IV every 8 hours.  If this is ineffective could consider Phenergan although she is already somewhat sleepy during my exam today and would need to be monitored for somnolence.  The use of a scopolamine  patch 1.5 mg applied every 72 hours is a another potential option. Start Dulcolax 5 to 10 mg p.o. daily.  May add  stool softener such as Colace to this.  Can trial MiraLAX  if she is able to tolerate  mixing it in 6 to 8 ounces of fluid.  If she is not passing bowel movements could consider the use of a Dulcolax suppository.  In the longer term she may be a candidate for Linzess or Motegrity if standard laxatives are ineffective. Discussed with pharmacy whether Pylera capsules could be crushed or if this would risk efficacy.  If that is not an option concur with pharmacy if liquid formulations of the components of Pylera -metronidazole  125 mg, tetracycline  125 mg and bismuth  140 mg could be made available for her to take instead. Continue pantoprazole  40 mg orally daily. Once her nausea is better controlled can revisit with the family the possibility of a solid-phase gastric emptying scan during this admission versus an empiric trial of a prokinetic agent such as erythromycin or Reglan . Continue IV fluid hydration and electrolyte repletion If neurologic deficits are observed consider CNS imaging to rule out CNS causes of chronic nausea and vomiting. Family inquired about the possibility of a colonoscopy as Ms. Rech has never had one.  I counseled them that she should certainly have a colonoscopy in the outpatient setting but that CT imaging has not disclosed abnormalities that would warrant one in the inpatient setting.  Furthermore she would be unable to tolerate the bowel prep with her current nausea and vomiting.   Thank you for your kind consultation, we will continue to follow.  Inocente HERO Yishai Rehfeld  12/07/2023, 4:03 PM

## 2023-12-08 DIAGNOSIS — K5909 Other constipation: Secondary | ICD-10-CM | POA: Diagnosis not present

## 2023-12-08 DIAGNOSIS — B9681 Helicobacter pylori [H. pylori] as the cause of diseases classified elsewhere: Secondary | ICD-10-CM | POA: Diagnosis not present

## 2023-12-08 DIAGNOSIS — R112 Nausea with vomiting, unspecified: Secondary | ICD-10-CM | POA: Diagnosis not present

## 2023-12-08 DIAGNOSIS — K2970 Gastritis, unspecified, without bleeding: Secondary | ICD-10-CM | POA: Diagnosis not present

## 2023-12-08 DIAGNOSIS — K5900 Constipation, unspecified: Secondary | ICD-10-CM

## 2023-12-08 LAB — URINALYSIS, ROUTINE W REFLEX MICROSCOPIC
Bilirubin Urine: NEGATIVE
Glucose, UA: NEGATIVE mg/dL
Hgb urine dipstick: NEGATIVE
Ketones, ur: 5 mg/dL — AB
Leukocytes,Ua: NEGATIVE
Nitrite: NEGATIVE
Protein, ur: NEGATIVE mg/dL
Specific Gravity, Urine: 1.008 (ref 1.005–1.030)
pH: 6 (ref 5.0–8.0)

## 2023-12-08 LAB — BASIC METABOLIC PANEL
Anion gap: 10 (ref 5–15)
BUN: 13 mg/dL (ref 8–23)
CO2: 25 mmol/L (ref 22–32)
Calcium: 8.5 mg/dL — ABNORMAL LOW (ref 8.9–10.3)
Chloride: 91 mmol/L — ABNORMAL LOW (ref 98–111)
Creatinine, Ser: 0.99 mg/dL (ref 0.44–1.00)
GFR, Estimated: >60 mL/min (ref >60)
Glucose, Bld: 88 mg/dL (ref 70–99)
Potassium: 3.6 mmol/L (ref 3.5–5.1)
Sodium: 126 mmol/L — ABNORMAL LOW (ref 135–145)

## 2023-12-08 LAB — GLUCOSE, CAPILLARY
Glucose-Capillary: 101 mg/dL — ABNORMAL HIGH (ref 70–99)
Glucose-Capillary: 72 mg/dL (ref 70–99)
Glucose-Capillary: 74 mg/dL (ref 70–99)
Glucose-Capillary: 78 mg/dL (ref 70–99)
Glucose-Capillary: 78 mg/dL (ref 70–99)
Glucose-Capillary: 81 mg/dL (ref 70–99)
Glucose-Capillary: 87 mg/dL (ref 70–99)
Glucose-Capillary: 99 mg/dL (ref 70–99)

## 2023-12-08 LAB — PHOSPHORUS: Phosphorus: 1.9 mg/dL — ABNORMAL LOW (ref 2.5–4.6)

## 2023-12-08 LAB — MAGNESIUM: Magnesium: 1.5 mg/dL — ABNORMAL LOW (ref 1.7–2.4)

## 2023-12-08 MED ORDER — OXYCODONE HCL 5 MG PO TABS
5.0000 mg | ORAL_TABLET | Freq: Once | ORAL | Status: AC | PRN
  Administered 2023-12-08: 5 mg via ORAL
  Filled 2023-12-08: qty 1

## 2023-12-08 MED ORDER — BISMUTH SUBSALICYLATE 262 MG/15ML PO SUSP
30.0000 mL | Freq: Two times a day (BID) | ORAL | Status: DC
  Administered 2023-12-09: 30 mL via ORAL
  Filled 2023-12-08: qty 236

## 2023-12-08 MED ORDER — SCOPOLAMINE 1 MG/3DAYS TD PT72
1.0000 | MEDICATED_PATCH | TRANSDERMAL | Status: DC
  Administered 2023-12-08 – 2023-12-11 (×2): 1.5 mg via TRANSDERMAL
  Filled 2023-12-08 (×2): qty 1

## 2023-12-08 MED ORDER — SODIUM CHLORIDE 0.9 % IV SOLN
1.0000 g | Freq: Four times a day (QID) | INTRAVENOUS | Status: DC
  Administered 2023-12-08 – 2023-12-12 (×15): 1 g via INTRAVENOUS
  Filled 2023-12-08 (×16): qty 1000

## 2023-12-08 MED ORDER — PANTOPRAZOLE SODIUM 40 MG IV SOLR
40.0000 mg | Freq: Every day | INTRAVENOUS | Status: DC
  Administered 2023-12-09 – 2023-12-11 (×3): 40 mg via INTRAVENOUS
  Filled 2023-12-08 (×3): qty 10

## 2023-12-08 MED ORDER — CLARITHROMYCIN 125 MG/5ML PO SUSR
500.0000 mg | Freq: Two times a day (BID) | ORAL | Status: DC
  Administered 2023-12-08 – 2023-12-10 (×5): 500 mg via ORAL
  Filled 2023-12-08 (×2): qty 20
  Filled 2023-12-08: qty 10
  Filled 2023-12-08: qty 20
  Filled 2023-12-08: qty 10
  Filled 2023-12-08 (×2): qty 20

## 2023-12-08 MED ORDER — MELATONIN 3 MG PO TABS: 3.0000 mg | ORAL_TABLET | Freq: Every evening | ORAL | Status: DC | PRN

## 2023-12-08 MED ORDER — SODIUM CHLORIDE 0.9 % IV SOLN
1.0000 g | Freq: Two times a day (BID) | INTRAVENOUS | Status: DC
  Administered 2023-12-08: 1 g via INTRAVENOUS
  Filled 2023-12-08 (×2): qty 1000

## 2023-12-08 MED ORDER — SODIUM CHLORIDE 0.9 % IV SOLN
8.0000 mg | Freq: Three times a day (TID) | INTRAVENOUS | Status: DC
  Administered 2023-12-08 – 2023-12-12 (×13): 8 mg via INTRAVENOUS
  Filled 2023-12-08 (×17): qty 4

## 2023-12-08 MED ORDER — MELATONIN 3 MG PO TABS: 3.0000 mg | ORAL_TABLET | Freq: Every day | ORAL | Status: DC

## 2023-12-08 MED ORDER — PANTOPRAZOLE SODIUM 40 MG IV SOLR: 40.0000 mg | Freq: Two times a day (BID) | INTRAVENOUS | Status: DC

## 2023-12-08 MED ORDER — METRONIDAZOLE 500 MG/100ML IV SOLN
500.0000 mg | Freq: Two times a day (BID) | INTRAVENOUS | Status: DC
  Administered 2023-12-08 – 2023-12-12 (×9): 500 mg via INTRAVENOUS
  Filled 2023-12-08 (×9): qty 100

## 2023-12-08 MED ORDER — OXYCODONE HCL 5 MG PO TABS: 5.0000 mg | ORAL_TABLET | Freq: Once | ORAL | Status: DC

## 2023-12-08 NOTE — Progress Notes (Addendum)
                  Subjective:   Summary: 63 year old woman with history of COPD on 3L prn, HTN, CKD, recent diagnosis of H pylori gastritis, presenting with nausea and vomiting  Patient feeling very fatigued today, still very nauseated. Reports one episode of vomiting yesterday and multiple bowel movements. Still hasn't been tolerating po  Objective:  Vital signs in last 24 hours: Vitals:   12/07/23 1830 12/07/23 2000 12/08/23 0459 12/08/23 0755  BP: 122/65 (!) 112/58 (!) 98/51 (!) 116/56  Pulse:  65 71 81  Resp: 18 20 20 20   Temp: 98.1 F (36.7 C) (!) 97.4 F (36.3 C) (!) 97.4 F (36.3 C) (!) 97.3 F (36.3 C)  TempSrc: Oral   Oral  SpO2: 95% 90% 91% 92%  Weight:      Height:       Supplemental O2: Nasal Cannula SpO2: 92 % O2 Flow Rate (L/min): 2 L/min  Physical Exam:  Constitutional: chronically ill appearing, mild distress Cardiovascular: RRR, no murmurs, rubs or gallops Pulmonary/Chest: normal work of breathing on room air, lungs clear to auscultation bilaterally Abdominal: soft, mildly tender to palpation Neuro: 5/5 strength, no focal deficit noted  Assessment/Plan:   Intractable Nausea, vomiting Ulcerative and erosive gastritis 2/2 H Pylori Continues to have nausea and some vomiting, still not tolerating anything po. GI consulted yesterday, appreciate recommendations. Recommended restarting triple therapy for H Pylori as it is unclear whether she received sufficient antibiotic course due to her n/v. Per pharmacy, there are no completely liquid/crushed options for triple therapy for H pylori. Spoke with infectious disease, who recommended an alternative IV and liquid antibiotic regimen.  - Per ID, Ampicillin  1mg  BID, IV metronidazole  500mg  BID, liquid clarithromycin  500mg  BID, Bismuth  BID - IV Protonix  40mg  daily, IV Zofran  q8h - Regular diet as tolerated - Ensure protein shakes BID  CKD III Creatinine 1.21, at baseline. Resuming regular diet as tolerated.    Hypokalemia Patient refused labs today, documented by RN  Hyponatremia Patient refused labs today, documented by RN  Neuropathic leg pain Has been on gabapentin  in the past, recently discontinued as it was reportedly causing double vision.  - Tylenol  650mg  q6h prn - Duloxetine  30mg  daily  Stage I pressure injury: Present on admission. Per WOC, skin care order set will provide sufficient care COPD: Home incruse Ellipta , O2 sat goal 88-92% Constipation: scheduled Miralax  Hypertension: Holding home amlodipine  HLD: Home pravastatin  20mg  Infrarenal abdominal aortic aneurysm: stable, abdominal ultrasound q3 years  Diet: Regular VTE: Lovenox  Code: Full  Dispo: Anticipated discharge to Home pending medical stability.   Redell Burnet, MD PGY-1 Internal Medicine Resident Pager Number [phone] Please contact the on call pager after 5 pm and on weekends at [phone].

## 2023-12-08 NOTE — Plan of Care (Signed)

## 2023-12-08 NOTE — Progress Notes (Signed)
 Consultation  Referring Provider:     Dr. Reyes Fenton Primary Care Physician:  Tobie Gaines, DO Primary Gastroenterologist:      Dr. Gordy Starch Reason for Consultation:       H. pylori gastritis Persistent nausea and vomiting Constipation Electrolyte abnormalities-hyponatremia and hypokalemia Weight loss          Interval History  - Reports continuing to feel nauseated without change in symptoms - Receiving ondansetron  8 mg IV every 8 hours - Started on a bowel regimen and reported to have 3 stools in last 24 hours - Primary team starting IV H. pylori treatment    HPI from Initial Consult 12/07/23  Kim [name] is a 63 y.o. female with past medical history of obesity, hypertension, hyperlipidemia, infrarenal AAA, chronic tobacco use, chronic respiratory failure on home oxygen  3L [place], COPD, OSA, CKD stage II and polyneuropathy who is seen in inpatient consultation at the request of Dr. Reyes Fenton for persistent symptoms of nausea and vomiting in the setting of a diagnosis of H. pylori gastritis.  In speaking with Kim [name] and her family as well as reviewing her prior medical records, she presented with nausea, vomiting and loss of appetite and 09/2023.  She was seen by her PCP and prescribed ondansetron  and Protonix  40 mg orally daily.  Her symptoms unfortunately progressed and she was offered inpatient hospitalization in December 2024 for which she initially declined.  She was started on mirtazapine  15 mg p.o. nightly for appetite stimulation.  She was ultimately admitted to [facility] 10/18/2023 - 10/20/2023 for evaluation of symptoms.  Laboratory studies (thyroid  profile, cortisol ) and CT imaging were unremarkable.  EGD showed acute ulcerative and erosive gastritis for which biopsies were positive for H. pylori.  She was prescribed Pylera for treatment of H. Pylori.  Per her daughter, she was unable to tolerate treatment and has not really taken any of the tablets.  Her  daughter also notes that the tablets are too big for her mother to swallow.  She was last seen in GI clinic on 11/30/2023 at which time she reported no improvement in nausea and vomiting and was unable to keep solid food down.  Labs were noteworthy for hypokalemia.  She was given omeprazole  20 mg orally daily and ondansetron  4 mg every 6 hours.  She was readmitted to [facility] on 12/06/2023 with ongoing symptoms.  Reports chronic nausea and intermittent vomiting when she tries to eat.  Daughter states she has not had solid food for several days and is worried about dehydration.  No hematemesis or coffee-ground emesis.  She does not think that Zofran  has been helpful.Records suggest that a gastric emptying scan was previously considered but not performed.  No documented history of gastroparesis.  Notes that she has chronic constipation and has not had a bowel movement for 1 week.  This has been an ongoing issue.  Occasionally uses stool softeners at home.  Has used suppositories but no enemas.  She has never had a colonoscopy.    Family is not endorsing focal neurologic deficits  Family history of gastrointestinal disorders for colon cancer.   Past Medical History:  Diagnosis Date   COPD (chronic obstructive pulmonary disease) (HCC)    H. pylori infection    High cholesterol    Hypertension    Smoker     Past Surgical History:  Procedure Laterality Date   BIOPSY  10/20/2023   Procedure: BIOPSY;  Surgeon: Starch Gordy HERO, MD;  Location: [facility] ENDOSCOPY;  Service: Gastroenterology;;   ESOPHAGOGASTRODUODENOSCOPY (EGD) WITH PROPOFOL  N/A 10/20/2023   Procedure: ESOPHAGOGASTRODUODENOSCOPY (EGD) WITH PROPOFOL ;  Surgeon: Albertus Gordy HERO, MD;  Location: [facility] ENDOSCOPY;  Service: Gastroenterology;  Laterality: N/A;   SALPINGECTOMY N/A     Family History  Problem Relation Age of Onset   CAD Mother    Colon cancer Neg Hx    Esophageal cancer Neg Hx      Social History   Tobacco Use   Smoking status:  Every Day    Current packs/day: 1.00    Average packs/day: 1 pack/day for 40.0 years (40.0 ttl pk-yrs)    Types: Cigarettes    Start date: 12/05/1983   Smokeless tobacco: Never  Vaping Use   Vaping status: Never Used  Substance Use Topics   Alcohol use: Not Currently   Drug use: Not Currently    Prior to Admission medications   Medication Sig Start Date End Date Taking? Authorizing Provider  acetaminophen  (TYLENOL ) 500 MG tablet Take 500 mg by mouth 3 (three) times daily as needed (pain).    [provider]  albuterol  (VENTOLIN  HFA) 108 (90 Base) MCG/ACT inhaler Inhale 2 puffs into the lungs every 4 (four) hours as needed for wheezing or shortness of breath. 10/07/22   Addie Perkins, DO  amLODipine  (NORVASC ) 5 MG tablet Take 1 tablet (5 mg total) by mouth daily. Patient not taking: Reported on 12/01/2023 08/12/22 10/11/22  Tobie Gaines, DO  Bismuth /Metronidaz/Tetracyclin [facility]) 140-125-125 MG CAPS Take 3 capsules by mouth 4 (four) times daily for 10 days. 10/23/23 11/02/23  Atway, Rayann N, DO  Nutritional Supplements (ENSURE COMPLETE SHAKE) LIQD Take 1 each by mouth in the morning and at bedtime. 10/20/23   Kandis Perkins, DO  omeprazole  (PRILOSEC ) 20 MG capsule Take 1 capsule (20 mg total) by mouth 2 (two) times daily before a meal for 10 days. 10/31/23 11/10/23  Zheng, Michael, DO  ondansetron  (ZOFRAN -ODT) 4 MG disintegrating tablet Take 1 tablet (4 mg total) by mouth every 6 (six) hours. 11/30/23   May, Deanna J, NP  OXYGEN  Inhale 3 L into the lungs as needed.    [provider]  potassium chloride  SA (KLOR-CON  M) 20 MEQ tablet Take 2 tablets (40 mEq total) by mouth once for 1 dose. 11/30/23 11/30/23  May, Deanna J, NP  pravastatin  (PRAVACHOL ) 20 MG tablet TAKE 1 TABLET BY MOUTH EVERYDAY AT BEDTIME Patient taking differently: Take 20 mg by mouth at bedtime. 12/13/22   Tobie Gaines, DO  senna-docusate (SENOKOT-S) 8.6-50 MG tablet Take 1 tablet by mouth at bedtime as needed for mild  constipation. 05/09/22   Elnora Ip, MD  sucralfate  (CARAFATE ) 1 GM/10ML suspension Take 10 mLs (1 g total) by mouth 4 (four) times daily -  with meals and at bedtime. 10/20/23   Kandis Perkins, DO  triamcinolone  (KENALOG ) 0.025 % ointment Apply 1 Application topically 2 (two) times daily as needed (Itching). 06/17/22   Addie Perkins, DO  umeclidinium bromide  (INCRUSE ELLIPTA ) 62.5 MCG/ACT AEPB Inhale 1 puff into the lungs daily. 03/07/23 03/06/24  Nguyen, Quan, DO    Current Facility-Administered Medications  Medication Dose Route Frequency Provider Last Rate Last Admin   acetaminophen  (TYLENOL ) tablet 650 mg  650 mg Oral Q6H PRN Zheng, Michael, DO   650 mg at 12/08/23 1725   Or   acetaminophen  (TYLENOL ) suppository 650 mg  650 mg Rectal Q6H PRN Zheng, Michael, DO       ampicillin  (OMNIPEN) 1 g in sodium  chloride 0.9 % 100 mL IVPB  1 g Intravenous Q6H Francella Rogue, MD       bismuth  subsalicylate (PEPTO BISMOL) 262 MG/15ML suspension 30 mL  30 mL Oral BID Francella Rogue, MD       capsaicin  (ZOSTRIX) 0.025 % cream   Topical PRN Tawkaliyar, Roya, DO   Given at 12/08/23 1137   clarithromycin  (BIAXIN ) 125 MG/5ML suspension 500 mg  500 mg Oral BID Francella Rogue, MD   500 mg at 12/08/23 1355   DULoxetine  (CYMBALTA ) DR capsule 30 mg  30 mg Oral Daily Francella Rogue, MD   30 mg at 12/08/23 0919   enoxaparin  (LOVENOX ) injection 40 mg  40 mg Subcutaneous Q24H Zheng, Michael, DO   40 mg at 12/08/23 1458   feeding supplement (ENSURE ENLIVE / ENSURE PLUS) liquid 237 mL  237 mL Oral BID BM Zheng, Michael, DO   237 mL at 12/07/23 1309   melatonin tablet 3 mg  3 mg Oral QHS PRN Zheng, Michael, DO       metroNIDAZOLE  (FLAGYL ) IVPB 500 mg  500 mg Intravenous BID Francella Rogue, MD 100 mL/hr at 12/08/23 1246 500 mg at 12/08/23 1246   nicotine  (NICODERM CQ  - dosed in mg/24 hours) patch 14 mg  14 mg Transdermal Daily Tawkaliyar, Roya, DO   14 mg at 12/08/23 0919   ondansetron  (ZOFRAN ) 8 mg in sodium chloride  0.9 % 50 mL  IVPB  8 mg Intravenous Q8H Francella Rogue, MD 216 mL/hr at 12/08/23 1436 8 mg at 12/08/23 1436   oxyCODONE  (Oxy IR/ROXICODONE ) immediate release tablet 5 mg  5 mg Oral Once PRN Zheng, Michael, DO       pantoprazole  (PROTONIX ) injection 40 mg  40 mg Intravenous Daily Francella Rogue, MD       polyethylene glycol (MIRALAX  / GLYCOLAX ) packet 17 g  17 g Oral Daily Francella Rogue, MD   17 g at 12/07/23 9163   pravastatin  (PRAVACHOL ) tablet 20 mg  20 mg Oral QHS Zheng, Michael, DO   20 mg at 12/07/23 2154   scopolamine  (TRANSDERM-SCOP) 1 MG/3DAYS 1.5 mg  1 patch Transdermal Q72H Francella Rogue, MD   1.5 mg at 12/08/23 1144   umeclidinium bromide  (INCRUSE ELLIPTA ) 62.5 MCG/ACT 1 puff  1 puff Inhalation Daily Elicia Sharper, DO   1 puff at 12/07/23 0841    Allergies as of 12/05/2023   (No Known Allergies)      Physical Exam:  Vital signs in last 24 hours: Temp:  [97.3 F (36.3 C)-98.1 F (36.7 C)] 97.5 F (36.4 C) (02/07 1804) Pulse Rate:  [65-81] 79 (02/07 1804) Resp:  [16-20] 16 (02/07 1804) BP: (98-122)/(51-65) 98/57 (02/07 1804) SpO2:  [90 %-95 %] 92 % (02/07 1804) Last BM Date : 12/08/23 General:   Sleepy, chronically ill-appearing woman resting quietly in bed Head:  Normocephalic and atraumatic. Eyes:   Anicteric sclerae Lungs: Overall clear to auscultation with slightly diminished breath sounds at bases Heart: Normal S1, S2.  Rate and rhythm Abdomen:  Soft, nondistended, nontender. No rebound or guarding. Normal bowel sounds. No appreciable masses or hepatomegaly. Rectal:  Not performed.  Msk:  Symmetrical without gross deformities. Peripheral pulses intact.  Extremities:  Without edema, no deformity or joint abnormality. Normal ROM, normal sensation. Neurologic: Somewhat sleepy but rouses to questions Skin:   Dry and intact without significant lesions or rashes.   LAB RESULTS: Recent Labs    12/05/23 1835 12/06/23 1157 12/07/23 0726  WBC 17.4* 12.6* 8.8  HGB 15.8*  13.1 13.7  HCT  44.9 37.4 39.4  PLT 183 134* 123*   BMET Recent Labs    12/05/23 1835 12/06/23 1157 12/07/23 0726  NA 131* 128* 130*  K 4.0 3.1* 3.3*  CL 87* 90* 91*  CO2 26 21* 26  GLUCOSE 82 60* 68*  BUN 21 24* 20  CREATININE 1.45* 1.31* 1.21*  CALCIUM  9.7 8.4* 8.7*   LFT Recent Labs    12/07/23 0726  PROT 6.1*  ALBUMIN 2.7*  AST 24  ALT 13  ALKPHOS 86  BILITOT 1.1   PT/INR No results for input(s): LABPROT, INR in the last 72 hours.  RADIOGRAPHIC STUDIES: No results found.  PREVIOUS ENDOSCOPIES:            EGD 10/20/2023   A. STOMACH, BIOPSY:  Marked chronic active gastritis with lymphoid aggregates  Helicobacter present  Negative for intestinal metaplasia, dysplasia and carcinoma    Impression / Plan:  It is my clinical impression that Kim [name] is a 64 year old woman with past medical history of obesity, hypertension, hyperlipidemia, infrarenal AAA, chronic tobacco use, chronic respiratory failure on home oxygen  3L [place], COPD, OSA, CKD stage II and polyneuropathy who presents with:  1.  H. pylori gastritis 2.  Persistent nausea and vomiting 3.  Constipation 4.  Electrolyte abnormalities-hyponatremia and hypokalemia 5.  Weight loss  I reviewed with Kim [name]'s family that her constellation of symptoms are likely multifactorial including: H. pylori gastritis, chronic constipation potentially contributing to upper GI nausea and discomfort and consideration of possible delayed gastric emptying or generalized gastrointestinal dysmotility.  Her imaging tests have not shown any evidence of malignancy or intestinal obstruction.  Previous laboratory testing has ruled out thyroid  disease and adrenal insufficiency.  She has not had CNS imaging but is not necessarily endorsing focal neurologic deficits to suggest a CNS etiology of her persistent nausea and vomiting.  She was started on ondansetron  8 mg IV every 8 hours yesterday but states that she continues to feel nauseated.  Her  primary team has coordinated a plan to provide H. pylori treatment by the intravenous route which is reasonable given that she has not been able to tolerate taking her H. pylori treatment orally.  A bowel regimen was started and by report her constipation is improving and she is beginning to pass bowel movements on a more regular basis.  We have previously discussed the possibility of a gastric emptying scan if she gets to the point where she can tolerate the toast and eggs that are typically administered with solid-phase GES.  Recommendations: Continue antiemetic treatment and a scheduled dosing fashion with ondansetron  8 mg IV every 8 hours.  If this is ineffective could consider Phenergan although she is already somewhat sleepy during my exam today and would need to be monitored for somnolence.  The use of a scopolamine  patch 1.5 mg applied every 72 hours is a another potential option. Continue bowel regimen with MiraLAX  and Dulcolax as needed.  In the longer term she may be a candidate for Linzess or Motegrity if standard laxatives are ineffective. Continue IV H. pylori treatment with ampicillin , metronidazole  and clarithromycin  Continue pantoprazole  40 mg IV daily. Once her nausea is better controlled can revisit with the family the possibility of a solid-phase gastric emptying scan during this admission versus an empiric trial of a prokinetic agent such as erythromycin or Reglan . Continue IV fluid hydration and electrolyte repletion If neurologic deficits are observed consider CNS imaging to rule out CNS causes of chronic  nausea and vomiting. Family inquired about the possibility of a colonoscopy as Kim [name] has never had one.  I counseled them that she should certainly have a colonoscopy in the outpatient setting but that CT imaging has not disclosed abnormalities that would warrant one in the inpatient setting.  Furthermore she would be unable to tolerate the bowel prep with her current  nausea and vomiting.   Thank you for your kind consultation, we will continue to follow.  Inocente HERO Flint Hakeem  12/08/2023, 6:21 PM

## 2023-12-08 NOTE — Progress Notes (Signed)
 Patient refused labs this morning.

## 2023-12-09 DIAGNOSIS — K5909 Other constipation: Secondary | ICD-10-CM | POA: Diagnosis not present

## 2023-12-09 DIAGNOSIS — K2970 Gastritis, unspecified, without bleeding: Secondary | ICD-10-CM | POA: Diagnosis not present

## 2023-12-09 DIAGNOSIS — B9681 Helicobacter pylori [H. pylori] as the cause of diseases classified elsewhere: Secondary | ICD-10-CM | POA: Diagnosis not present

## 2023-12-09 DIAGNOSIS — R112 Nausea with vomiting, unspecified: Secondary | ICD-10-CM | POA: Diagnosis not present

## 2023-12-09 LAB — BASIC METABOLIC PANEL
Anion gap: 12 (ref 5–15)
BUN: 11 mg/dL (ref 8–23)
CO2: 23 mmol/L (ref 22–32)
Calcium: 8.3 mg/dL — ABNORMAL LOW (ref 8.9–10.3)
Chloride: 91 mmol/L — ABNORMAL LOW (ref 98–111)
Creatinine, Ser: 0.98 mg/dL (ref 0.44–1.00)
GFR, Estimated: >60 mL/min (ref >60)
Glucose, Bld: 77 mg/dL (ref 70–99)
Potassium: 3.5 mmol/L (ref 3.5–5.1)
Sodium: 126 mmol/L — ABNORMAL LOW (ref 135–145)

## 2023-12-09 LAB — GLUCOSE, CAPILLARY
Glucose-Capillary: 76 mg/dL (ref 70–99)
Glucose-Capillary: 107 mg/dL — ABNORMAL HIGH (ref 70–99)
Glucose-Capillary: 100 mg/dL — ABNORMAL HIGH (ref 70–99)
Glucose-Capillary: 88 mg/dL (ref 70–99)
Glucose-Capillary: 103 mg/dL — ABNORMAL HIGH (ref 70–99)

## 2023-12-09 MED ORDER — POTASSIUM & SODIUM PHOSPHATES 280-160-250 MG PO PACK
2.0000 | PACK | ORAL | Status: AC
  Administered 2023-12-09 (×2): 2 via ORAL
  Filled 2023-12-09 (×4): qty 2

## 2023-12-09 MED ORDER — BISACODYL 5 MG PO TBEC: 5.0000 mg | DELAYED_RELEASE_TABLET | Freq: Every day | ORAL | Status: DC | PRN

## 2023-12-09 MED ORDER — MELATONIN 3 MG PO TABS
3.0000 mg | ORAL_TABLET | Freq: Every day | ORAL | Status: DC
  Administered 2023-12-09 – 2023-12-13 (×5): 3 mg via ORAL
  Filled 2023-12-09 (×5): qty 1

## 2023-12-09 MED ORDER — MAGNESIUM SULFATE 4 GM/100ML IV SOLN
4.0000 g | Freq: Once | INTRAVENOUS | Status: AC
  Administered 2023-12-09: 4 g via INTRAVENOUS
  Filled 2023-12-09: qty 100

## 2023-12-09 NOTE — Evaluation (Signed)
 Physical Therapy Evaluation Patient Details Name: DEVRA STARE MRN: [id number] DOB: [birth year] Today's Date: 12/09/2023  History of Present Illness  Pt is a 63 y/o F presenting to ED on 2/4 wtih poor appetite, vomiting, admittedf or intractabel n/v. PMH includes h. pylori and gastritis, COPD on 3L O2, HTN, CKD  Clinical Impression  Pt presents with admitting diagnosis above. Pt today was able to sit EOB with Min A however adamantly refused further mobility. Pt noted to be very self limiting during session. PTA it appears that pt was mostly sedentary at home however per RN family reports steady decline in function over last month. Patient will benefit from continued inpatient follow up therapy, <3 hours/day. PT will continue to follow.         If plan is discharge home, recommend the following: A little help with walking and/or transfers;A little help with bathing/dressing/bathroom;Assistance with cooking/housework;Assist for transportation;Help with stairs or ramp for entrance   Can travel by private vehicle   Yes    Equipment Recommendations Other (comment) (Per accepting facility)  Recommendations for Other Services       Functional Status Assessment Patient has had a recent decline in their functional status and demonstrates the ability to make significant improvements in function in a reasonable and predictable amount of time.     Precautions / Restrictions Precautions Precautions: Fall Precaution Comments: 3L O2 baseline, intermittently? Restrictions Weight Bearing Restrictions Per Provider Order: No      Mobility  Bed Mobility Overal bed mobility: Needs Assistance Bed Mobility: Supine to Sit     Supine to sit: Min assist     General bed mobility comments: Min A for trunk elevation.    Transfers                   General transfer comment: Pt refused    Ambulation/Gait               General Gait Details: Refused  Stairs             Wheelchair Mobility     Tilt Bed    Modified Rankin (Stroke Patients Only)       Balance Overall balance assessment: Needs assistance Sitting-balance support: Feet supported Sitting balance-Leahy Scale: Fair                                       Pertinent Vitals/Pain Pain Assessment Pain Assessment: No/denies pain    Home Living Family/patient expects to be discharged to:: Private residence Living Arrangements: Children (daughter) Available Help at Discharge: Family;Available 24 hours/day Type of Home: House Home Access: Stairs to enter Entrance Stairs-Rails: Can reach both Entrance Stairs-Number of Steps: 4   Home Layout: One level Home Equipment: Rollator (4 wheels);Cane - single point;BSC/3in1      Prior Function Prior Level of Function : Independent/Modified Independent             Mobility Comments: rollator for mobility, limited household ambulator ADLs Comments: sponge bathes at baseline, scared of falling in the shower, ind with ADL/med/mgmt     Extremity/Trunk Assessment   Upper Extremity Assessment Upper Extremity Assessment: Generalized weakness    Lower Extremity Assessment Lower Extremity Assessment: Generalized weakness (Reports neuropathy in BLE)    Cervical / Trunk Assessment Cervical / Trunk Assessment: Normal  Communication   Communication Communication: No apparent difficulties  Cognition Arousal: Lethargic Behavior During Therapy: [facility]  for tasks assessed/performed Overall Cognitive Status: Within Functional Limits for tasks assessed                                 General Comments: needs incr motivation and reasoning on need to mobilize        General Comments General comments (skin integrity, edema, etc.): VSS    Exercises     Assessment/Plan    PT Assessment Patient needs continued PT services  PT Problem List Decreased strength;Decreased range of motion;Decreased activity  tolerance;Decreased balance;Decreased mobility;Decreased coordination;Decreased knowledge of use of DME;Decreased safety awareness;Decreased knowledge of precautions;Cardiopulmonary status limiting activity       PT Treatment Interventions DME instruction;Gait training;Stair training;Functional mobility training;Therapeutic activities;Therapeutic exercise;Balance training;Neuromuscular re-education;Patient/family education    PT Goals (Current goals can be found in the Care Plan section)  Acute Rehab PT Goals Patient Stated Goal: to go home PT Goal Formulation: With patient Time For Goal Achievement: 12/23/23 Potential to Achieve Goals: Fair    Frequency Min 1X/week     Co-evaluation               AM-PAC PT 6 Clicks Mobility  Outcome Measure Help needed turning from your back to your side while in a flat bed without using bedrails?: A Little Help needed moving from lying on your back to sitting on the side of a flat bed without using bedrails?: A Little Help needed moving to and from a bed to a chair (including a wheelchair)?: A Little Help needed standing up from a chair using your arms (e.g., wheelchair or bedside chair)?: A Little Help needed to walk in hospital room?: A Lot Help needed climbing 3-5 steps with a railing? : A Lot 6 Click Score: 16    End of Session   Activity Tolerance: Patient limited by fatigue Patient left: in bed;with call bell/phone within reach;with bed alarm set Nurse Communication: Mobility status PT Visit Diagnosis: Other abnormalities of gait and mobility (R26.89)    Time: 1442-1450 PT Time Calculation (min) (ACUTE ONLY): 8 min   Charges:   PT Evaluation $PT Eval Moderate Complexity: 1 Mod   PT General Charges $$ ACUTE PT VISIT: 1 Visit         Sueellen NOVAK, PT, DPT Acute Rehab Services [phone]   Naiomi Musto 12/09/2023, 3:41 PM

## 2023-12-09 NOTE — Progress Notes (Addendum)
                  Subjective:   Summary: 63 year old woman with history of COPD on 3L prn, HTN, CKD, recent diagnosis of H pylori gastritis, presenting with nausea and vomiting  Evaluated patient at bedside with daughter present. States no further vomiting. Has been having small BM. Poor appetite but ate some sherbet yesterday. Slept some last night.   Objective:  Vital signs in last 24 hours: Vitals:   12/08/23 1934 12/08/23 1940 12/09/23 0336 12/09/23 0720  BP: (!) 106/52  122/67 112/61  Pulse: 83  78 75  Resp: 17  17 16   Temp: 97.9 F (36.6 C)  (!) 97.4 F (36.3 C) (!) 97.4 F (36.3 C)  TempSrc: Oral   Oral  SpO2: (!) 81% 94% 100% 100%  Weight:      Height:       Supplemental O2: Nasal Cannula SpO2: 100 % O2 Flow Rate (L/min): 2 L/min FiO2 (%): 100 %  Physical Exam:  Constitutional: chronically ill appearing, no acute distress Cardiovascular: RRR Pulmonary/Chest: normal work of breathing on 2L [place], lungs clear to auscultation bilaterally  Abdominal: bowel sounds present, soft, minimal TTP, non-distended Neuro: awake and alert  Assessment/Plan:  Summary: 63 year old woman with history of COPD on 3L prn, HTN, CKD, recent diagnosis of H pylori gastritis, presenting with nausea and vomiting  Intractable Nausea and vomiting, improved Ulcerative and erosive gastritis 2/2 H Pylori No further emesis with scheduled Zofran . Continue with treatment for H pylori per GI. Will encourage po intake today which family agrees with.  - Per ID, Ampicillin  1mg  Q6H, IV metronidazole  500mg  BID, liquid clarithromycin  500mg  BID, Bismuth  BID (day 2/14) - IV Protonix  40 mg daily - IV Zofran  8 mg Q8H - Scopolamine  patch Q72H   - Miralax  daily and dulcolax  - Ensure protein shakes and encourage po intake   Hypokalemia Hypomagnesemia  Hypophosphatemia  K was 3.5. Mag was 1.9. Phos was 1.5. Likely in setting of poor po intake due to n/v. Will monitor and replete as needed.  - Replete IV Mag  (4 g) and Phos-Nak (2 packets x 4 doses) - Trend BMP, Mag and Phos, replete as needed  Hyponatremia Na dropped to 126. Likely in setting of poor po intake. Will continue working on addressing nausea to improve po. S/p IV fluids.  - Trend BMP  CKD IIIA Creatinine 0.99, at baseline. Received IV fluids. Encourage po hydration.   Neuropathic leg pain Has been on gabapentin  in the past, recently discontinued as it was reportedly causing double vision.  - Capsaicin  cream PRN - Tylenol  650mg  q6h prn - Duloxetine  30mg  daily  Thrombocytopenia PLT last was 123. No signs of bleeding or new bruising. Continue to monitor and get peripheral smear if significant/further declines.  - Trend CBC  Stage I pressure injury: Present on admission. Per WOC, skin care order set will provide sufficient care COPD: Home incruse Ellipta , SpO2 goal 88-92% Constipation: scheduled Miralax  Hypertension: Normotensive, holding home amlodipine , restart when appropriate  HLD: Home pravastatin  20mg  Infrarenal abdominal aortic aneurysm: stable, abdominal ultrasound q3 years  Diet: Regular VTE: Lovenox  Code: Full  Dispo: Anticipated discharge to Home pending medical stability.   Cosima Prentiss, DO Internal Medicine Resident PGY-2 Pager: [phone] Please contact the on-call pager after 5 pm and on weekends at [phone].

## 2023-12-09 NOTE — Progress Notes (Signed)
 Consultation  Referring Provider:     Dr. Reyes Fenton Primary Care Physician:  Tobie Gaines, DO Primary Gastroenterologist:      Dr. Gordy Starch Reason for Consultation:       H. pylori gastritis Persistent nausea and vomiting Constipation Electrolyte abnormalities-hyponatremia and hypokalemia Weight loss          Interval History  - Nausea slightly improved with the addition of a scopolamine  patch - Reported to have consumed sherbet yesterday and a small amount of breakfast - 2 stools recorded in the last 24 hours - Labs show chronic hyponatremia with sodium 126    HPI from Initial Consult 12/07/23  Kim [name] is a 63 y.o. female with past medical history of obesity, hypertension, hyperlipidemia, infrarenal AAA, chronic tobacco use, chronic respiratory failure on home oxygen  3L [place], COPD, OSA, CKD stage II and polyneuropathy who is seen in inpatient consultation at the request of Dr. Reyes Fenton for persistent symptoms of nausea and vomiting in the setting of a diagnosis of H. pylori gastritis.  In speaking with Kim [name] and her [name] as well as reviewing her prior medical records, she presented with nausea, vomiting and loss of appetite and 09/2023.  She was seen by her PCP and prescribed ondansetron  and Protonix  40 mg orally daily.  Her symptoms unfortunately progressed and she was offered inpatient hospitalization in December 2024 for which she initially declined.  She was started on mirtazapine  15 mg p.o. nightly for appetite stimulation.  She was ultimately admitted to [facility] 10/18/2023 - 10/20/2023 for evaluation of symptoms.  Laboratory studies (thyroid  profile, cortisol ) and CT imaging were unremarkable.  EGD showed acute ulcerative and erosive gastritis for which biopsies were positive for H. pylori.  She was prescribed Pylera for treatment of H. Pylori.  Per her daughter, she was unable to tolerate treatment and has not really taken any of the tablets.   Her daughter also notes that the tablets are too big for her mother to swallow.  She was last seen in GI clinic on 11/30/2023 at which time she reported no improvement in nausea and vomiting and was unable to keep solid food down.  Labs were noteworthy for hypokalemia.  She was given omeprazole  20 mg orally daily and ondansetron  4 mg every 6 hours.  She was readmitted to [facility] on 12/06/2023 with ongoing symptoms.  Reports chronic nausea and intermittent vomiting when she tries to eat.  Daughter states she has not had solid food for several days and is worried about dehydration.  No hematemesis or coffee-ground emesis.  She does not think that Zofran  has been helpful.Records suggest that a gastric emptying scan was previously considered but not performed.  No documented history of gastroparesis.  Notes that she has chronic constipation and has not had a bowel movement for 1 week.  This has been an ongoing issue.  Occasionally uses stool softeners at home.  Has used suppositories but no enemas.  She has never had a colonoscopy.    [name] is not endorsing focal neurologic deficits  [name] history of gastrointestinal disorders for colon cancer.   Past Medical History:  Diagnosis Date   COPD (chronic obstructive pulmonary disease) (HCC)    H. pylori infection    High cholesterol    Hypertension    Smoker     Past Surgical History:  Procedure Laterality Date   BIOPSY  10/20/2023   Procedure: BIOPSY;  Surgeon: Starch Gordy HERO, MD;  Location:  [facility] ENDOSCOPY;  Service: Gastroenterology;;   ESOPHAGOGASTRODUODENOSCOPY (EGD) WITH PROPOFOL  N/A 10/20/2023   Procedure: ESOPHAGOGASTRODUODENOSCOPY (EGD) WITH PROPOFOL ;  Surgeon: Albertus Gordy HERO, MD;  Location[facility] ENDOSCOPY;  Service: Gastroenterology;  Laterality: N/A;   SALPINGECTOMY N/A     [name] History  Problem Relation Age of Onset   CAD Mother    Colon cancer Neg Hx    Esophageal cancer Neg Hx      Social History   Tobacco Use   Smoking  status: Every Day    Current packs/day: 1.00    Average packs/day: 1 pack/day for 40.0 years (40.0 ttl pk-yrs)    Types: Cigarettes    Start date: 12/05/1983   Smokeless tobacco: Never  Vaping Use   Vaping status: Never Used  Substance Use Topics   Alcohol use: Not Currently   Drug use: Not Currently    Prior to Admission medications   Medication Sig Start Date End Date Taking? Authorizing Provider  acetaminophen  (TYLENOL ) 500 MG tablet Take 500 mg by mouth 3 (three) times daily as needed (pain).    [provider]  albuterol  (VENTOLIN  HFA) 108 (90 Base) MCG/ACT inhaler Inhale 2 puffs into the lungs every 4 (four) hours as needed for wheezing or shortness of breath. 10/07/22   Addie Perkins, DO  amLODipine  (NORVASC ) 5 MG tablet Take 1 tablet (5 mg total) by mouth daily. Patient not taking: Reported on 12/01/2023 08/12/22 10/11/22  Tobie Gaines, DO  Bismuth /Metronidaz/Tetracyclin [facility]) 140-125-125 MG CAPS Take 3 capsules by mouth 4 (four) times daily for 10 days. 10/23/23 11/02/23  Atway, Rayann N, DO  Nutritional Supplements (ENSURE COMPLETE SHAKE) LIQD Take 1 each by mouth in the morning and at bedtime. 10/20/23   Kandis Perkins, DO  omeprazole  (PRILOSEC ) 20 MG capsule Take 1 capsule (20 mg total) by mouth 2 (two) times daily before a meal for 10 days. 10/31/23 11/10/23  Zheng, Michael, DO  ondansetron  (ZOFRAN -ODT) 4 MG disintegrating tablet Take 1 tablet (4 mg total) by mouth every 6 (six) hours. 11/30/23   May, Deanna J, NP  OXYGEN  Inhale 3 L into the lungs as needed.    [provider]  potassium chloride  SA (KLOR-CON  M) 20 MEQ tablet Take 2 tablets (40 mEq total) by mouth once for 1 dose. 11/30/23 11/30/23  May, Deanna J, NP  pravastatin  (PRAVACHOL ) 20 MG tablet TAKE 1 TABLET BY MOUTH EVERYDAY AT BEDTIME Patient taking differently: Take 20 mg by mouth at bedtime. 12/13/22   Tobie Gaines, DO  senna-docusate (SENOKOT-S) 8.6-50 MG tablet Take 1 tablet by mouth at bedtime as needed  for mild constipation. 05/09/22   Elnora Ip, MD  sucralfate  (CARAFATE ) 1 GM/10ML suspension Take 10 mLs (1 g total) by mouth 4 (four) times daily -  with meals and at bedtime. 10/20/23   Kandis Perkins, DO  triamcinolone  (KENALOG ) 0.025 % ointment Apply 1 Application topically 2 (two) times daily as needed (Itching). 06/17/22   Addie Perkins, DO  umeclidinium bromide  (INCRUSE ELLIPTA ) 62.5 MCG/ACT AEPB Inhale 1 puff into the lungs daily. 03/07/23 03/06/24  Nguyen, Quan, DO    Current Facility-Administered Medications  Medication Dose Route Frequency Provider Last Rate Last Admin   acetaminophen  (TYLENOL ) tablet 650 mg  650 mg Oral Q6H PRN Zheng, Michael, DO   650 mg at 12/09/23 9478   Or   acetaminophen  (TYLENOL ) suppository 650 mg  650 mg Rectal Q6H PRN Zheng, Michael, DO       ampicillin  (OMNIPEN) 1 g in sodium chloride   0.9 % 100 mL IVPB  1 g Intravenous Q6H Francella Rogue, MD 300 mL/hr at 12/09/23 1220 1 g at 12/09/23 1220   bisacodyl  (DULCOLAX) EC tablet 5 mg  5 mg Oral Daily PRN Zheng, Michael, DO       bismuth  subsalicylate (PEPTO BISMOL) 262 MG/15ML suspension 30 mL  30 mL Oral BID Francella Rogue, MD   30 mL at 12/09/23 1000   capsaicin  (ZOSTRIX) 0.025 % cream   Topical PRN Tawkaliyar, Roya, DO   Given at 12/08/23 1137   clarithromycin  (BIAXIN ) 125 MG/5ML suspension 500 mg  500 mg Oral BID Francella Rogue, MD   500 mg at 12/09/23 9045   DULoxetine  (CYMBALTA ) DR capsule 30 mg  30 mg Oral Daily Francella Rogue, MD   30 mg at 12/09/23 9046   enoxaparin  (LOVENOX ) injection 40 mg  40 mg Subcutaneous Q24H Zheng, Michael, DO   40 mg at 12/09/23 1419   feeding supplement (ENSURE ENLIVE / ENSURE PLUS) liquid 237 mL  237 mL Oral BID BM Zheng, Michael, DO   237 mL at 12/07/23 1309   magnesium  sulfate IVPB 4 g 100 mL  4 g Intravenous Once Zheng, Michael, DO 50 mL/hr at 12/09/23 1250 4 g at 12/09/23 1250   melatonin tablet 3 mg  3 mg Oral QHS Zheng, Michael, DO       metroNIDAZOLE  (FLAGYL ) IVPB 500 mg  500  mg Intravenous BID Francella Rogue, MD 100 mL/hr at 12/09/23 1107 500 mg at 12/09/23 1107   nicotine  (NICODERM CQ  - dosed in mg/24 hours) patch 14 mg  14 mg Transdermal Daily Tawkaliyar, Roya, DO   14 mg at 12/09/23 0954   ondansetron  (ZOFRAN ) 8 mg in sodium chloride  0.9 % 50 mL IVPB  8 mg Intravenous Q8H Francella Rogue, MD 216 mL/hr at 12/09/23 0507 8 mg at 12/09/23 0507   pantoprazole  (PROTONIX ) injection 40 mg  40 mg Intravenous Daily Francella Rogue, MD   40 mg at 12/09/23 1104   polyethylene glycol (MIRALAX  / GLYCOLAX ) packet 17 g  17 g Oral Daily Francella Rogue, MD   17 g at 12/09/23 0953   potassium & sodium phosphates  (PHOS-NAK) 280-160-250 MG packet 2 packet  2 packet Oral Q4H Elicia Sharper, DO   2 packet at 12/09/23 9045   pravastatin  (PRAVACHOL ) tablet 20 mg  20 mg Oral QHS Zheng, Michael, DO   20 mg at 12/08/23 2146   scopolamine  (TRANSDERM-SCOP) 1 MG/3DAYS 1.5 mg  1 patch Transdermal Q72H Francella Rogue, MD   1.5 mg at 12/08/23 1144   umeclidinium bromide  (INCRUSE ELLIPTA ) 62.5 MCG/ACT 1 puff  1 puff Inhalation Daily Elicia Sharper, DO   1 puff at 12/09/23 0956    Allergies as of 12/05/2023   (No Known Allergies)      Physical Exam:  Vital signs in last 24 hours: Temp:  [97.4 F (36.3 C)-97.9 F (36.6 C)] 97.4 F (36.3 C) (02/08 0720) Pulse Rate:  [75-83] 75 (02/08 0720) Resp:  [16-17] 16 (02/08 0720) BP: (98-122)/(52-67) 112/61 (02/08 0720) SpO2:  [81 %-100 %] 93 % (02/08 0959) FiO2 (%):  [100 %] 100 % (02/08 0720) Last BM Date : 12/09/23 General:   Sleepy, chronically ill-appearing woman resting quietly in bed Head:  Normocephalic and atraumatic. Eyes:   Anicteric sclerae Lungs: Overall clear to auscultation with slightly diminished breath sounds at bases Heart: Normal S1, S2.  Rate and rhythm Abdomen:  Soft, nondistended, nontender. No rebound or guarding. Normal bowel sounds. No appreciable masses  or hepatomegaly. Rectal:  Not performed.  Msk:  Symmetrical without gross  deformities. Peripheral pulses intact.  Extremities:  Without edema, no deformity or joint abnormality. Normal ROM, normal sensation. Neurologic: Somewhat sleepy but rouses to questions Skin:   Dry and intact without significant lesions or rashes.   LAB RESULTS: Recent Labs    12/07/23 0726  WBC 8.8  HGB 13.7  HCT 39.4  PLT 123*   BMET Recent Labs    12/07/23 0726 12/08/23 1801 12/09/23 0611  NA 130* 126* 126*  K 3.3* 3.6 3.5  CL 91* 91* 91*  CO2 26 25 23   GLUCOSE 68* 88 77  BUN 20 13 11   CREATININE 1.21* 0.99 0.98  CALCIUM  8.7* 8.5* 8.3*   LFT Recent Labs    12/07/23 0726  PROT 6.1*  ALBUMIN 2.7*  AST 24  ALT 13  ALKPHOS 86  BILITOT 1.1   PT/INR No results for input(s): LABPROT, INR in the last 72 hours.  RADIOGRAPHIC STUDIES: No results found.  PREVIOUS ENDOSCOPIES:            EGD 10/20/2023   A. STOMACH, BIOPSY:  Marked chronic active gastritis with lymphoid aggregates  Helicobacter present  Negative for intestinal metaplasia, dysplasia and carcinoma    Impression / Plan:  It is my clinical impression that Kim [name] is a 63 year old woman with past medical history of obesity, hypertension, hyperlipidemia, infrarenal AAA, chronic tobacco use, chronic respiratory failure on home oxygen  3L [place], COPD, OSA, CKD stage II and polyneuropathy who presents with:  1.  H. pylori gastritis 2.  Persistent nausea and vomiting 3.  Constipation 4.  Electrolyte abnormalities-hyponatremia and hypokalemia 5.  Weight loss  I reviewed with Kim [name] that her constellation of symptoms are likely multifactorial including: H. pylori gastritis, chronic constipation potentially contributing to upper GI nausea and discomfort and consideration of possible delayed gastric emptying or generalized gastrointestinal dysmotility.  Her imaging tests have not shown any evidence of malignancy or intestinal obstruction.  Previous laboratory testing has ruled out thyroid   disease and adrenal insufficiency.  She has not had CNS imaging but is not necessarily endorsing focal neurologic deficits to suggest a CNS etiology of her persistent nausea and vomiting.  She is continuing on ondansetron  8 mg IV every 8 hours in conjunction with a scopolamine  patch and notes mild improvement in her nausea.  She  Her primary team has coordinated a plan to provide H. pylori treatment by the intravenous route which is reasonable given that she has not been able to tolerate taking her H. pylori treatment orally.  A bowel regimen was started and by report her constipation is improving and she is beginning to pass bowel movements on a more regular basis.  We have previously discussed the possibility of a gastric emptying scan if she gets to the point where she can tolerate the toast and eggs that are typically administered with solid-phase GES.  Recommendations: Continue antiemetic treatment and a scheduled dosing fashion with ondansetron  8 mg IV every 8 hours and scopolamine  patch Continue bowel regimen with MiraLAX  and Dulcolax as needed.  In the longer term she may be a candidate for Linzess or Motegrity if standard laxatives are ineffective. Continue IV H. pylori treatment with ampicillin , metronidazole  and clarithromycin  Continue pantoprazole  40 mg IV daily. Once her nausea is better controlled can revisit with the [name] the possibility of a solid-phase gastric emptying scan during this admission versus an empiric trial of a prokinetic agent such as erythromycin  or Reglan . Continue IV fluid hydration and electrolyte repletion If neurologic deficits are observed consider CNS imaging to rule out CNS causes of chronic nausea and vomiting. [name] inquired about the possibility of a colonoscopy as Kim [name] has never had one.  I counseled them that she should certainly have a colonoscopy in the outpatient setting but that CT imaging has not disclosed abnormalities that would warrant  one in the inpatient setting.  Furthermore she would be unable to tolerate the bowel prep with her current nausea and vomiting.   Thank you for your kind consultation, we will continue to follow.  Inocente HERO Jerremy Maione  12/09/2023, 2:34 PM

## 2023-12-09 NOTE — Evaluation (Signed)
 Occupational Therapy Evaluation Patient Details Name: Kim [name] MRN: [id number] DOB: September 27, 1961 Today's Date: 12/09/2023   History of Present Illness Pt is a 63 y/o F presenting to ED on 2/4 wtih poor appetite, vomiting, admittedf or intractabel n/v. PMH includes h. pylori and gastritis, COPD on 3L O2, HTN, CKD   Clinical Impression   Pt reports sponge bathing at baseline, and spends most of the time in bed, gets up to use the restroom as needed at home. Pt uses rollator for mobility and lives at home with daughter. Pt currently needs set up -max A for ADLs, min A for bed mobility, and min A for transfers with RW. Pt able to take a few side steps at EOB before needing to sit/return to supine. Pt presenting with impairments listed below, will follow acutely. Patient will benefit from continued inpatient follow up therapy, <3 hours/day to maximize safety/ind with ADL/functional mobility.        If plan is discharge home, recommend the following: A little help with walking and/or transfers;A lot of help with bathing/dressing/bathroom;Assistance with cooking/housework;Direct supervision/assist for financial management;Direct supervision/assist for medications management;Help with stairs or ramp for entrance;Assist for transportation    Functional Status Assessment  Patient has had a recent decline in their functional status and demonstrates the ability to make significant improvements in function in a reasonable and predictable amount of time.  Equipment Recommendations  Other (comment) (defer)    Recommendations for Other Services PT consult     Precautions / Restrictions Precautions Precautions: Fall Precaution Comments: 3L O2 baseline, intermittently? Restrictions Weight Bearing Restrictions Per Provider Order: No      Mobility Bed Mobility Overal bed mobility: Needs Assistance Bed Mobility: Supine to Sit     Supine to sit: Min assist          Transfers Overall  transfer level: Needs assistance Equipment used: Rolling walker (2 wheels) Transfers: Sit to/from Stand Sit to Stand: Min assist                  Balance Overall balance assessment: Needs assistance Sitting-balance support: Feet supported Sitting balance-Leahy Scale: Fair     Standing balance support: During functional activity, Reliant on assistive device for balance Standing balance-Leahy Scale: Poor Standing balance comment: reliant on external support                           ADL either performed or assessed with clinical judgement   ADL Overall ADL's : Needs assistance/impaired Eating/Feeding: Set up;Sitting   Grooming: Set up;Sitting   Upper Body Bathing: Moderate assistance   Lower Body Bathing: Maximal assistance   Upper Body Dressing : Moderate assistance   Lower Body Dressing: Maximal assistance   Toilet Transfer: Minimal assistance;Rolling walker (2 wheels)   Toileting- Clothing Manipulation and Hygiene: Maximal assistance       Functional mobility during ADLs: Minimal assistance;Rolling walker (2 wheels)       Vision   Vision Assessment?: No apparent visual deficits     Perception Perception: Not tested       Praxis Praxis: Not tested       Pertinent Vitals/Pain Pain Assessment Pain Assessment: No/denies pain     Extremity/Trunk Assessment Upper Extremity Assessment Upper Extremity Assessment: Generalized weakness (reports neuropathy in hands)   Lower Extremity Assessment Lower Extremity Assessment: Defer to PT evaluation   Cervical / Trunk Assessment Cervical / Trunk Assessment: Normal   Communication Communication Communication: No apparent  difficulties   Cognition Arousal: Lethargic Behavior During Therapy: WFL for tasks assessed/performed Overall Cognitive Status: Within Functional Limits for tasks assessed                                 General Comments: needs incr motivation and reasoning on  need to mobilize     General Comments  VSS.    Exercises     Shoulder Instructions      Home Living Family/patient expects to be discharged to:: Private residence Living Arrangements: Children (daughter) Available Help at Discharge: Family;Available 24 hours/day Type of Home: House Home Access: Stairs to enter [employer] of Steps: 4 Entrance Stairs-Rails: Can reach both Home Layout: One level     Bathroom Shower/Tub: Sponge bathes at baseline   Bathroom Toilet: Standard Bathroom Accessibility: Yes   Home Equipment: Rollator (4 wheels);Cane - single point;BSC/3in1          Prior Functioning/Environment Prior Level of Function : Independent/Modified Independent             Mobility Comments: rollator for mobility, limited household ambulator ADLs Comments: sponge bathes at baseline, scared of falling in the shower, ind with ADL/med/mgmt        OT Problem List: Decreased strength;Decreased activity tolerance;Decreased range of motion;Impaired balance (sitting and/or standing);Decreased coordination;Impaired sensation      OT Treatment/Interventions: Self-care/ADL training;Therapeutic exercise;Energy conservation;DME and/or AE instruction;Therapeutic activities;Balance training;Patient/family education    OT Goals(Current goals can be found in the care plan section) Acute Rehab OT Goals Patient Stated Goal: none stated OT Goal Formulation: With patient Time For Goal Achievement: 12/23/23 Potential to Achieve Goals: Good ADL Goals Pt Will Perform Upper Body Dressing: with modified independence;sitting Pt Will Perform Lower Body Dressing: with min assist;with adaptive equipment;sitting/lateral leans;sit to/from stand;bed level Pt Will Transfer to Toilet: with contact guard assist;ambulating;regular height toilet  OT Frequency: Min 1X/week    Co-evaluation              AM-PAC OT 6 Clicks Daily Activity     Outcome Measure Help from another  person eating meals?: None Help from another person taking care of personal grooming?: A Little Help from another person toileting, which includes using toliet, bedpan, or urinal?: A Lot Help from another person bathing (including washing, rinsing, drying)?: A Lot Help from another person to put on and taking off regular upper body clothing?: A Little Help from another person to put on and taking off regular lower body clothing?: A Lot 6 Click Score: 16   End of Session Equipment Utilized During Treatment: Gait belt;Rolling walker (2 wheels);Oxygen  Nurse Communication: Mobility status  Activity Tolerance: Patient tolerated treatment well Patient left: in bed;with call bell/phone within reach;with bed alarm set  OT Visit Diagnosis: Unsteadiness on feet (R26.81);Other abnormalities of gait and mobility (R26.89);Muscle weakness (generalized) (M62.81)                Time: 8894-8870 OT Time Calculation (min): 24 min Charges:  OT General Charges $OT Visit: 1 Visit OT Evaluation $OT Eval Moderate Complexity: 1 Mod OT Treatments $Self Care/Home Management : 8-22 mins  Erric Machnik K, OTD, OTR/L SecureChat Preferred Acute Rehab [phone]   Laneta POUR Koonce 12/09/2023, 12:59 PM

## 2023-12-10 DIAGNOSIS — B9681 Helicobacter pylori [H. pylori] as the cause of diseases classified elsewhere: Secondary | ICD-10-CM | POA: Diagnosis not present

## 2023-12-10 DIAGNOSIS — R112 Nausea with vomiting, unspecified: Secondary | ICD-10-CM | POA: Diagnosis not present

## 2023-12-10 DIAGNOSIS — E878 Other disorders of electrolyte and fluid balance, not elsewhere classified: Secondary | ICD-10-CM | POA: Diagnosis not present

## 2023-12-10 DIAGNOSIS — K2970 Gastritis, unspecified, without bleeding: Secondary | ICD-10-CM | POA: Diagnosis not present

## 2023-12-10 LAB — CBC WITH DIFFERENTIAL/PLATELET
Abs Immature Granulocytes: 0.09 10*3/uL — ABNORMAL HIGH (ref 0.00–0.07)
Basophils Absolute: 0 10*3/uL (ref 0.0–0.1)
Basophils Relative: 1 %
Eosinophils Absolute: 0.1 10*3/uL (ref 0.0–0.5)
Eosinophils Relative: 2 %
HCT: 33.5 % — ABNORMAL LOW (ref 36.0–46.0)
Hemoglobin: 11.8 g/dL — ABNORMAL LOW (ref 12.0–15.0)
Immature Granulocytes: 1 %
Lymphocytes Relative: 23 %
Lymphs Abs: 1.7 10*3/uL (ref 0.7–4.0)
MCH: 35 pg — ABNORMAL HIGH (ref 26.0–34.0)
MCHC: 35.2 g/dL (ref 30.0–36.0)
MCV: 99.4 fL (ref 80.0–100.0)
Monocytes Absolute: 0.6 10*3/uL (ref 0.1–1.0)
Monocytes Relative: 8 %
Neutro Abs: 4.9 10*3/uL (ref 1.7–7.7)
Neutrophils Relative %: 65 %
Platelets: 100 10*3/uL — ABNORMAL LOW (ref 150–400)
RBC: 3.37 MIL/uL — ABNORMAL LOW (ref 3.87–5.11)
RDW: 14.8 % (ref 11.5–15.5)
WBC: 7.4 10*3/uL (ref 4.0–10.5)
nRBC: 0 % (ref 0.0–0.2)

## 2023-12-10 LAB — CBC
HCT: 35.3 % — ABNORMAL LOW (ref 36.0–46.0)
Hemoglobin: 12.4 g/dL (ref 12.0–15.0)
MCH: 35.1 pg — ABNORMAL HIGH (ref 26.0–34.0)
MCHC: 35.1 g/dL (ref 30.0–36.0)
MCV: 100 fL (ref 80.0–100.0)
Platelets: 103 10*3/uL — ABNORMAL LOW (ref 150–400)
RBC: 3.53 MIL/uL — ABNORMAL LOW (ref 3.87–5.11)
RDW: 14.6 % (ref 11.5–15.5)
WBC: 7 10*3/uL (ref 4.0–10.5)
nRBC: 0 % (ref 0.0–0.2)

## 2023-12-10 LAB — GLUCOSE, CAPILLARY
Glucose-Capillary: 74 mg/dL (ref 70–99)
Glucose-Capillary: 81 mg/dL (ref 70–99)
Glucose-Capillary: 84 mg/dL (ref 70–99)
Glucose-Capillary: 84 mg/dL (ref 70–99)
Glucose-Capillary: 92 mg/dL (ref 70–99)
Glucose-Capillary: 96 mg/dL (ref 70–99)

## 2023-12-10 LAB — BASIC METABOLIC PANEL
Anion gap: 11 (ref 5–15)
BUN: 8 mg/dL (ref 8–23)
CO2: 26 mmol/L (ref 22–32)
Calcium: 8.1 mg/dL — ABNORMAL LOW (ref 8.9–10.3)
Chloride: 91 mmol/L — ABNORMAL LOW (ref 98–111)
Creatinine, Ser: 1 mg/dL (ref 0.44–1.00)
GFR, Estimated: >60 mL/min (ref >60)
Glucose, Bld: 86 mg/dL (ref 70–99)
Potassium: 3.3 mmol/L — ABNORMAL LOW (ref 3.5–5.1)
Sodium: 128 mmol/L — ABNORMAL LOW (ref 135–145)

## 2023-12-10 LAB — TECHNOLOGIST SMEAR REVIEW: Plt Morphology: DECREASED

## 2023-12-10 LAB — PHOSPHORUS: Phosphorus: 2.2 mg/dL — ABNORMAL LOW (ref 2.5–4.6)

## 2023-12-10 LAB — MAGNESIUM: Magnesium: 2.6 mg/dL — ABNORMAL HIGH (ref 1.7–2.4)

## 2023-12-10 MED ORDER — POTASSIUM & SODIUM PHOSPHATES 280-160-250 MG PO PACK
2.0000 | PACK | Freq: Once | ORAL | Status: AC
  Administered 2023-12-10: 2 via ORAL
  Filled 2023-12-10: qty 2

## 2023-12-10 MED ORDER — DULOXETINE HCL 60 MG PO CPEP
60.0000 mg | ORAL_CAPSULE | Freq: Every day | ORAL | Status: DC
  Administered 2023-12-11: 60 mg via ORAL
  Filled 2023-12-10: qty 1

## 2023-12-10 MED ORDER — CLARITHROMYCIN 250 MG/5ML PO SUSR
500.0000 mg | Freq: Two times a day (BID) | ORAL | Status: DC
  Administered 2023-12-10 – 2023-12-14 (×8): 500 mg via ORAL
  Filled 2023-12-10 (×9): qty 10

## 2023-12-10 MED ORDER — MIRTAZAPINE 15 MG PO TBDP
15.0000 mg | ORAL_TABLET | Freq: Every day | ORAL | Status: DC
  Administered 2023-12-10: 15 mg via ORAL
  Filled 2023-12-10: qty 1

## 2023-12-10 NOTE — TOC Initial Note (Addendum)
 Transition of Care [facility]) - Initial/Assessment Note    Patient Details  Name: Kim [name] MRN: [id number] Date of Birth: [birth year]  Transition of Care [facility]) CM/SW Contact:    Rayshell Goecke A Tayshaun Kroh, LCSW Phone Number: 12/10/2023, 1:33 PM  Clinical Narrative:                  CSW met with pt and pt's significant other, Dasie to discuss recommendation for SNF. She and Dasie stated that they would prefer home with Home health rather than SNF. Dasie stated that he lives at home, due to report of his cancer diagnosis, and states he feels for providing care 24/7. CSW notified RNCM update to disposition plan at pt request.    TOC will continue to follow.  Expected Discharge Plan: Home w Home Health Services Barriers to Discharge: Continued Medical Work up   Patient Goals and CMS Choice Patient states their goals for this hospitalization and ongoing recovery are:: wanna go home          Expected Discharge Plan and Services     Post Acute Care Choice: Home Health Living arrangements for the past 2 months: Single Family Home                                      Prior Living Arrangements/Services Living arrangements for the past 2 months: Single Family Home Lives with:: Spouse          Need for Family Participation in Patient Care: Yes (Comment) Care giver support system in place?: Yes (comment) (pt's s.o. Dasie)      Activities of Daily Living   ADL Screening (condition at time of admission) Independently performs ADLs?: Yes (appropriate for developmental age) Is the patient deaf or have difficulty hearing?: No Does the patient have difficulty seeing, even when wearing glasses/contacts?: No Does the patient have difficulty concentrating, remembering, or making decisions?: No  Permission Sought/Granted                  Emotional Assessment Appearance:: Appears older than stated age Attitude/Demeanor/Rapport: Lethargic Affect (typically observed):  Quiet Orientation: : Oriented to Self, Oriented to Place, Oriented to  Time, Oriented to Situation Alcohol / Substance Use: Not Applicable Psych Involvement: No (comment)  Admission diagnosis:  Dehydration [E86.0] Epigastric pain [R10.13] Intractable vomiting [R11.10] Intractable vomiting with nausea [R11.2] Intractable nausea and vomiting [R11.2] Patient Active Problem List   Diagnosis Date Noted   Constipation 12/08/2023   H. pylori infection 12/07/2023   Aortic atherosclerosis (HCC) 12/07/2023   Intractable vomiting with nausea 12/06/2023   Intractable nausea and vomiting 12/04/2023   Helicobacter pylori gastritis 11/22/2023   Colon cancer screening 11/22/2023   Encounter for screening mammogram for malignant neoplasm of breast 11/22/2023   Taking multiple medications for chronic disease 11/22/2023   Multiple gastric ulcers 10/20/2023   PUD (peptic ulcer disease) 10/20/2023   Rapid weight loss, unintentional 10/18/2023   Weight loss, unintentional 10/18/2023   Pressure injury of skin 10/18/2023   Polyneuropathy 10/11/2023   Infrarenal abdominal aortic aneurysm (AAA) without rupture (HCC) 05/17/2022   Respiratory failure, chronic (HCC) 05/17/2022   Stable angina (HCC) 05/17/2022   Erythrocytosis 05/02/2022   Obesity, Class III, BMI 40-49.9 (morbid obesity) (HCC) 05/02/2022   CKD (chronic kidney disease), stage III (HCC) 05/02/2022   OSA (obstructive sleep apnea)    COPD (chronic obstructive pulmonary disease) (HCC) 09/02/2019   Essential  hypertension 09/02/2019   HLD (hyperlipidemia) 09/02/2019   PCP:  Tobie Gaines, DO Pharmacy:   [facility] [phone] - [place], [place] - [address] [place] [address] Phone: [phone] Fax: [phone]  [facility] [address] [place] [address] Phone: [phone] Fax: [phone]     Social Drivers of Health (SDOH) Social  History: SDOH Screenings   Food Insecurity: No Food Insecurity (12/06/2023)  Housing: Low Risk  (12/06/2023)  Transportation Needs: No Transportation Needs (12/06/2023)  Utilities: Not At Risk (12/06/2023)  Recent Concern: Utilities - At Risk (10/19/2023)  Depression (PHQ2-9): Low Risk  (10/11/2023)  Social Connections: Unknown (12/06/2023)  Tobacco Use: High Risk (12/06/2023)   SDOH Interventions:     Readmission Risk Interventions    05/09/2022   12:02 PM  Readmission Risk Prevention Plan  Post Dischage Appt Complete  Medication Screening Complete  Transportation Screening Complete

## 2023-12-10 NOTE — Progress Notes (Signed)
                  Subjective:   Summary: 63 year old woman with history of COPD on 3L prn, HTN, CKD, recent diagnosis of H pylori gastritis, presenting with nausea and vomiting  Patient feels a little better today. She tolerated a couple bites of breakfast this morning but doesn't have an appetite. She has been having many bowel movements. Improved nausea, no recent vomiting.   Objective:  Vital signs in last 24 hours: Vitals:   12/09/23 1926 12/10/23 0340 12/10/23 0721 12/10/23 0738  BP: (!) 97/53 (!) 103/50  121/67  Pulse: 89 79  72  Resp: 17 18  18   Temp: 97.8 F (36.6 C) 97.8 F (36.6 C)  98.7 F (37.1 C)  TempSrc: Oral Oral    SpO2: 98% 94% 94% 100%  Weight:      Height:       Supplemental O2: Nasal Cannula SpO2: 100 % O2 Flow Rate (L/min): 2 L/min FiO2 (%): 100 %  Physical Exam:  Constitutional: chronically ill appearing, no acute distress Cardiovascular: RRR Pulmonary/Chest: normal work of breathing on 2L [place], lungs clear to auscultation bilaterally  Abdominal: bowel sounds present, soft, minimal TTP, non-distended Neuro: awake and alert  Assessment/Plan:  Summary: 63 year old woman with history of COPD on 3L prn, HTN, CKD, recent diagnosis of H pylori gastritis, presenting with nausea and vomiting  Intractable Nausea and vomiting, improved Ulcerative and erosive gastritis 2/2 H Pylori No further emesis with scheduled Zofran . Continue with treatment for H pylori with IV antibiotics until patient can tolerate po. She has been having many bowel movements, bowel regimen decreased.  - Per ID, Ampicillin  1mg  Q6H, IV metronidazole  500mg  BID, liquid clarithromycin  500mg  BID, Bismuth  BID (day 3/14) - IV Protonix  40 mg daily - IV Zofran  8 mg Q8H - Scopolamine  patch Q72H   - prn Dulcolax - Ensure protein shakes and encourage po intake   Hypokalemia Hypomagnesemia  Hypophosphatemia  K 3.3. Mag 2.6. Phos 2.2. Likely in setting of poor po intake due to n/v. Repleted  with Phos-Nak 2 packets. Patient was able to tolerate some food this morning, she just does not have much of an appetite.  - Restarting mirtazapine  15mg  to help with poor appetite.  - Trend BMP, Mag and Phos, replete as needed  Hyponatremia Na 128. Likely in setting of poor po intake. Will continue working on addressing nausea to improve po. S/p IV fluids.  - Trend BMP  CKD IIIA Creatinine 1.0, at baseline. Encourage po hydration.   Neuropathic leg pain Has been on gabapentin  in the past, recently discontinued as it was reportedly causing double vision.  - Capsaicin  cream PRN - Tylenol  650mg  q6h prn - Duloxetine  increased to 60mg  daily  Thrombocytopenia PLT last was 123. No signs of bleeding or new bruising. Continue to monitor and get peripheral smear if significant/further declines.  - Trend CBC  Stage I pressure injury: Present on admission. Per WOC, skin care order set will provide sufficient care COPD: Home incruse Ellipta , SpO2 goal 88-92% Constipation: scheduled Miralax  Hypertension: Normotensive, holding home amlodipine , restart when appropriate  HLD: Home pravastatin  20mg  Infrarenal abdominal aortic aneurysm: stable, abdominal ultrasound q3 years  Diet: Regular VTE: Lovenox  Code: Full  Dispo: Anticipated discharge to Skilled nursing facility pending medical stability.   Redell Burnet, MD Internal Medicine Resident PGY-1 Pager: [phone] Please contact the on-call pager after 5 pm and on weekends at [phone].

## 2023-12-10 NOTE — Progress Notes (Signed)
 Consultation  Referring Provider:     Dr. Reyes Fenton Primary Care Physician:  Tobie Gaines, DO Primary Gastroenterologist:      Dr. Gordy Starch Reason for Consultation:       H. pylori gastritis Persistent nausea and vomiting Constipation Electrolyte abnormalities-hyponatremia and hypokalemia Weight loss          Interval History  - Nausea slightly improved with the addition of a scopolamine  patch - P.o. intake remains minimal -ate part of a pancake this morning - 10  stools recorded in the last 24 hours - Labs show chronic hyponatremia with sodium 128 and hypokalemia with potassium 3.3    HPI from Initial Consult 12/07/23  Kim [name] is a 63 y.o. female with past medical history of obesity, hypertension, hyperlipidemia, infrarenal AAA, chronic tobacco use, chronic respiratory failure on home oxygen  3L [place], COPD, OSA, CKD stage II and polyneuropathy who is seen in inpatient consultation at the request of Dr. Reyes Fenton for persistent symptoms of nausea and vomiting in the setting of a diagnosis of H. pylori gastritis.  In speaking with [name] and her family as well as reviewing her prior medical records, she presented with nausea, vomiting and loss of appetite and 09/2023.  She was seen by her PCP and prescribed ondansetron  and Protonix  40 mg orally daily.  Her symptoms unfortunately progressed and she was offered inpatient hospitalization in December 2024 for which she initially declined.  She was started on mirtazapine  15 mg p.o. nightly for appetite stimulation.  She was ultimately admitted to [facility] 10/18/2023 - 10/20/2023 for evaluation of symptoms.  Laboratory studies (thyroid  profile, cortisol ) and CT imaging were unremarkable.  EGD showed acute ulcerative and erosive gastritis for which biopsies were positive for H. pylori.  She was prescribed Pylera for treatment of H. Pylori.  Per her daughter, she was unable to tolerate treatment and has not really  taken any of the tablets.  Her daughter also notes that the tablets are too big for her mother to swallow.  She was last seen in GI clinic on 11/30/2023 at which time she reported no improvement in nausea and vomiting and was unable to keep solid food down.  Labs were noteworthy for hypokalemia.  She was given omeprazole  20 mg orally daily and ondansetron  4 mg every 6 hours.  She was readmitted to [facility] on 12/06/2023 with ongoing symptoms.  Reports chronic nausea and intermittent vomiting when she tries to eat.  Daughter states she has not had solid food for several days and is worried about dehydration.  No hematemesis or coffee-ground emesis.  She does not think that Zofran  has been helpful.Records suggest that a gastric emptying scan was previously considered but not performed.  No documented history of gastroparesis.  Notes that she has chronic constipation and has not had a bowel movement for 1 week.  This has been an ongoing issue.  Occasionally uses stool softeners at home.  Has used suppositories but no enemas.  She has never had a colonoscopy.    Family is not endorsing focal neurologic deficits  Family history of gastrointestinal disorders for colon cancer.   Past Medical History:  Diagnosis Date   COPD (chronic obstructive pulmonary disease) (HCC)    H. pylori infection    High cholesterol    Hypertension    Smoker     Past Surgical History:  Procedure Laterality Date   BIOPSY  10/20/2023   Procedure: BIOPSY;  Surgeon: Starch,  Gordy HERO, MD;  Location: [facility] ENDOSCOPY;  Service: Gastroenterology;;   ESOPHAGOGASTRODUODENOSCOPY (EGD) WITH PROPOFOL  N/A 10/20/2023   Procedure: ESOPHAGOGASTRODUODENOSCOPY (EGD) WITH PROPOFOL ;  Surgeon: Albertus Gordy HERO, MD;  Location: [facility] ENDOSCOPY;  Service: Gastroenterology;  Laterality: N/A;   SALPINGECTOMY N/A     Family History  Problem Relation Age of Onset   CAD Mother    Colon cancer Neg Hx    Esophageal cancer Neg Hx      Social History    Tobacco Use   Smoking status: Every Day    Current packs/day: 1.00    Average packs/day: 1 pack/day for 40.0 years (40.0 ttl pk-yrs)    Types: Cigarettes    Start date: 12/05/1983   Smokeless tobacco: Never  Vaping Use   Vaping status: Never Used  Substance Use Topics   Alcohol use: Not Currently   Drug use: Not Currently    Prior to Admission medications   Medication Sig Start Date End Date Taking? Authorizing Provider  acetaminophen  (TYLENOL ) 500 MG tablet Take 500 mg by mouth 3 (three) times daily as needed (pain).    [provider]  albuterol  (VENTOLIN  HFA) 108 (90 Base) MCG/ACT inhaler Inhale 2 puffs into the lungs every 4 (four) hours as needed for wheezing or shortness of breath. 10/07/22   Addie Perkins, DO  amLODipine  (NORVASC ) 5 MG tablet Take 1 tablet (5 mg total) by mouth daily. Patient not taking: Reported on 12/01/2023 08/12/22 10/11/22  Tobie Gaines, DO  Bismuth /Metronidaz/Tetracyclin [facility]) 140-125-125 MG CAPS Take 3 capsules by mouth 4 (four) times daily for 10 days. 10/23/23 11/02/23  Atway, Rayann N, DO  Nutritional Supplements (ENSURE COMPLETE SHAKE) LIQD Take 1 each by mouth in the morning and at bedtime. 10/20/23   Kandis Perkins, DO  omeprazole  (PRILOSEC ) 20 MG capsule Take 1 capsule (20 mg total) by mouth 2 (two) times daily before a meal for 10 days. 10/31/23 11/10/23  Zheng, Michael, DO  ondansetron  (ZOFRAN -ODT) 4 MG disintegrating tablet Take 1 tablet (4 mg total) by mouth every 6 (six) hours. 11/30/23   May, Deanna J, NP  OXYGEN  Inhale 3 L into the lungs as needed.    [provider]  potassium chloride  SA (KLOR-CON  M) 20 MEQ tablet Take 2 tablets (40 mEq total) by mouth once for 1 dose. 11/30/23 11/30/23  May, Deanna J, NP  pravastatin  (PRAVACHOL ) 20 MG tablet TAKE 1 TABLET BY MOUTH EVERYDAY AT BEDTIME Patient taking differently: Take 20 mg by mouth at bedtime. 12/13/22   Tobie Gaines, DO  senna-docusate (SENOKOT-S) 8.6-50 MG tablet Take 1 tablet by  mouth at bedtime as needed for mild constipation. 05/09/22   Elnora Ip, MD  sucralfate  (CARAFATE ) 1 GM/10ML suspension Take 10 mLs (1 g total) by mouth 4 (four) times daily -  with meals and at bedtime. 10/20/23   Kandis Perkins, DO  triamcinolone  (KENALOG ) 0.025 % ointment Apply 1 Application topically 2 (two) times daily as needed (Itching). 06/17/22   Addie Perkins, DO  umeclidinium bromide  (INCRUSE ELLIPTA ) 62.5 MCG/ACT AEPB Inhale 1 puff into the lungs daily. 03/07/23 03/06/24  Nguyen, Quan, DO    Current Facility-Administered Medications  Medication Dose Route Frequency Provider Last Rate Last Admin   acetaminophen  (TYLENOL ) tablet 650 mg  650 mg Oral Q6H PRN Zheng, Michael, DO   650 mg at 12/09/23 2306   Or   acetaminophen  (TYLENOL ) suppository 650 mg  650 mg Rectal Q6H PRN Zheng, Michael, DO       ampicillin  (OMNIPEN)  1 g in sodium chloride  0.9 % 100 mL IVPB  1 g Intravenous Q6H Francella Rogue, MD 300 mL/hr at 12/10/23 0545 1 g at 12/10/23 0545   bisacodyl  (DULCOLAX) EC tablet 5 mg  5 mg Oral Daily PRN Zheng, Michael, DO       bismuth  subsalicylate (PEPTO BISMOL) 262 MG/15ML suspension 30 mL  30 mL Oral BID Francella Rogue, MD   30 mL at 12/09/23 1000   capsaicin  (ZOSTRIX) 0.025 % cream   Topical PRN Tawkaliyar, Roya, DO   Given at 12/08/23 1137   clarithromycin  (BIAXIN ) 125 MG/5ML suspension 500 mg  500 mg Oral BID Francella Rogue, MD   500 mg at 12/10/23 0929   [START ON 12/11/2023] DULoxetine  (CYMBALTA ) DR capsule 60 mg  60 mg Oral Daily Francella Rogue, MD       enoxaparin  (LOVENOX ) injection 40 mg  40 mg Subcutaneous Q24H Zheng, Michael, DO   40 mg at 12/10/23 9081   feeding supplement (ENSURE ENLIVE / ENSURE PLUS) liquid 237 mL  237 mL Oral BID BM Zheng, Michael, DO   237 mL at 12/07/23 1309   melatonin tablet 3 mg  3 mg Oral QHS Zheng, Michael, DO   3 mg at 12/09/23 2147   metroNIDAZOLE  (FLAGYL ) IVPB 500 mg  500 mg Intravenous BID Francella Rogue, MD 100 mL/hr at 12/10/23 0928 500 mg at  12/10/23 9071   nicotine  (NICODERM CQ  - dosed in mg/24 hours) patch 14 mg  14 mg Transdermal Daily Tawkaliyar, Roya, DO   14 mg at 12/10/23 9081   ondansetron  (ZOFRAN ) 8 mg in sodium chloride  0.9 % 50 mL IVPB  8 mg Intravenous Q8H Perez, Brian, MD 216 mL/hr at 12/10/23 0505 8 mg at 12/10/23 0505   pantoprazole  (PROTONIX ) injection 40 mg  40 mg Intravenous Daily Francella Rogue, MD   40 mg at 12/10/23 0918   potassium & sodium phosphates  (PHOS-NAK) 280-160-250 MG packet 2 packet  2 packet Oral Once Francella Rogue, MD       pravastatin  (PRAVACHOL ) tablet 20 mg  20 mg Oral QHS Zheng, Michael, DO   20 mg at 12/09/23 2147   scopolamine  (TRANSDERM-SCOP) 1 MG/3DAYS 1.5 mg  1 patch Transdermal Q72H Francella Rogue, MD   1.5 mg at 12/08/23 1144   umeclidinium bromide  (INCRUSE ELLIPTA ) 62.5 MCG/ACT 1 puff  1 puff Inhalation Daily Elicia Sharper, DO   1 puff at 12/10/23 9278    Allergies as of 12/05/2023   (No Known Allergies)      Physical Exam:  Vital signs in last 24 hours: Temp:  [97.3 F (36.3 C)-98.7 F (37.1 C)] 98.7 F (37.1 C) (02/09 0738) Pulse Rate:  [72-89] 72 (02/09 0738) Resp:  [17-18] 18 (02/09 0738) BP: (97-121)/(50-67) 121/67 (02/09 0738) SpO2:  [91 %-100 %] 100 % (02/09 0738) Last BM Date : 12/09/23 General:   Sleepy, chronically ill-appearing woman resting quietly in bed Head:  Normocephalic and atraumatic. Eyes:   Anicteric sclerae Lungs: Overall clear to auscultation with slightly diminished breath sounds at bases Heart: Normal S1, S2.  Rate and rhythm Abdomen:  Soft, nondistended, nontender. No rebound or guarding. Normal bowel sounds. No appreciable masses or hepatomegaly. Rectal:  Not performed.  Msk:  Symmetrical without gross deformities. Peripheral pulses intact.  Extremities:  Without edema, no deformity or joint abnormality. Normal ROM, normal sensation. Neurologic: Somewhat sleepy but rouses to questions Skin:   Dry and intact without significant lesions or  rashes.   LAB RESULTS: Recent Labs  12/10/23 0705  WBC 7.0  HGB 12.4  HCT 35.3*  PLT 103*   BMET Recent Labs    12/08/23 1801 12/09/23 0611 12/10/23 0705  NA 126* 126* 128*  K 3.6 3.5 3.3*  CL 91* 91* 91*  CO2 25 23 26   GLUCOSE 88 77 86  BUN 13 11 8   CREATININE 0.99 0.98 1.00  CALCIUM  8.5* 8.3* 8.1*   LFT No results for input(s): PROT, ALBUMIN, AST, ALT, ALKPHOS, BILITOT, BILIDIR, IBILI in the last 72 hours.  PT/INR No results for input(s): LABPROT, INR in the last 72 hours.  RADIOGRAPHIC STUDIES: No results found.  PREVIOUS ENDOSCOPIES:            EGD 10/20/2023   A. STOMACH, BIOPSY:  Marked chronic active gastritis with lymphoid aggregates  Helicobacter present  Negative for intestinal metaplasia, dysplasia and carcinoma    Impression / Plan:  It is my clinical impression that Ms. Kim [name] is a 63 year old woman with past medical history of obesity, hypertension, hyperlipidemia, infrarenal AAA, chronic tobacco use, chronic respiratory failure on home oxygen  3L [place], COPD, OSA, CKD stage II and polyneuropathy who presents with:  1.  H. pylori gastritis 2.  Persistent nausea and vomiting 3.  Constipation 4.  Electrolyte abnormalities-hyponatremia and hypokalemia 5.  Weight loss  I reviewed with Ms.Simi's family that her constellation of symptoms are likely multifactorial including: H. pylori gastritis, chronic constipation potentially contributing to upper GI nausea and discomfort and consideration of possible delayed gastric emptying or generalized gastrointestinal dysmotility.  Her imaging tests have not shown any evidence of malignancy or intestinal obstruction.  Previous laboratory testing has ruled out thyroid  disease and adrenal insufficiency.  She has not had CNS imaging but is not necessarily endorsing focal neurologic deficits to suggest a CNS etiology of her persistent nausea and vomiting.  She is continuing on ondansetron  8 mg IV  every 8 hours in conjunction with a scopolamine  patch and notes mild improvement in her nausea.  As her nausea continues to improve can transition to p.o. ondansetron  in the future.  She  Her primary team has coordinated a plan to provide H. pylori treatment by the intravenous route which is reasonable given that she has not been able to tolerate taking her H. pylori treatment orally.  A bowel regimen was started due to her reported history of constipation.  Over the last 24 hours she has had 10 stools.  Would recommend considering holding MiraLAX  today and monitoring stool output.  We have previously discussed the possibility of a gastric emptying scan if she gets to the point where she can tolerate the toast and eggs that are typically administered with solid-phase GES.  Recommendations: Continue antiemetic treatment and a scheduled dosing fashion with ondansetron  8 mg IV every 8 hours and scopolamine  patch Consider holding today's dose of MiraLAX  given 10 bowel movements in the last 24 hours.  If constipation recurs can reinstate on a daily basis and use Dulcolax as needed.  In the longer term she may be a candidate for Linzess or Motegrity if standard laxatives are ineffective. Continue IV H. pylori treatment with ampicillin , metronidazole  and clarithromycin  Continue pantoprazole  40 mg IV daily. Once her nausea is better controlled can revisit with the family the possibility of a solid-phase gastric emptying scan during this admission versus an empiric trial of a prokinetic agent such as erythromycin or Reglan . Continue IV fluid hydration and electrolyte repletion If neurologic deficits are observed consider CNS imaging to rule out CNS causes  of chronic nausea and vomiting. Family inquired about the possibility of a colonoscopy as Ms. Shain has never had one.  I counseled them that she should certainly have a colonoscopy in the outpatient setting but that CT imaging has not disclosed  abnormalities that would warrant one in the inpatient setting.  Furthermore she would be unable to tolerate the bowel prep with her current nausea and vomiting.  Continue to encourage p.o. intake.  It appears that she has been without significant enteral nutrition for several days.  If trajectory continues may need to consider short-term NG tube feeds.  Thank you for your kind consultation, we will continue to follow.  Inocente HERO Nikolaos Maddocks  12/10/2023, 10:05 AM

## 2023-12-10 NOTE — Plan of Care (Signed)

## 2023-12-11 ENCOUNTER — Inpatient Hospital Stay (HOSPITAL_COMMUNITY): Payer: Medicaid Other

## 2023-12-11 DIAGNOSIS — R112 Nausea with vomiting, unspecified: Secondary | ICD-10-CM | POA: Diagnosis not present

## 2023-12-11 DIAGNOSIS — M792 Neuralgia and neuritis, unspecified: Secondary | ICD-10-CM | POA: Diagnosis not present

## 2023-12-11 DIAGNOSIS — E860 Dehydration: Secondary | ICD-10-CM | POA: Diagnosis not present

## 2023-12-11 DIAGNOSIS — K2970 Gastritis, unspecified, without bleeding: Secondary | ICD-10-CM

## 2023-12-11 DIAGNOSIS — R1013 Epigastric pain: Secondary | ICD-10-CM | POA: Diagnosis not present

## 2023-12-11 DIAGNOSIS — B9681 Helicobacter pylori [H. pylori] as the cause of diseases classified elsewhere: Secondary | ICD-10-CM | POA: Diagnosis not present

## 2023-12-11 DIAGNOSIS — K5900 Constipation, unspecified: Secondary | ICD-10-CM

## 2023-12-11 DIAGNOSIS — A048 Other specified bacterial intestinal infections: Secondary | ICD-10-CM | POA: Diagnosis not present

## 2023-12-11 LAB — BASIC METABOLIC PANEL
Anion gap: 10 (ref 5–15)
BUN: <5 mg/dL — ABNORMAL LOW (ref 8–23)
CO2: 27 mmol/L (ref 22–32)
Calcium: 8.3 mg/dL — ABNORMAL LOW (ref 8.9–10.3)
Chloride: 97 mmol/L — ABNORMAL LOW (ref 98–111)
Creatinine, Ser: 1.04 mg/dL — ABNORMAL HIGH (ref 0.44–1.00)
GFR, Estimated: >60 mL/min (ref >60)
Glucose, Bld: 85 mg/dL (ref 70–99)
Potassium: 3 mmol/L — ABNORMAL LOW (ref 3.5–5.1)
Sodium: 134 mmol/L — ABNORMAL LOW (ref 135–145)

## 2023-12-11 LAB — CBC
HCT: 35.2 % — ABNORMAL LOW (ref 36.0–46.0)
Hemoglobin: 12.2 g/dL (ref 12.0–15.0)
MCH: 35.2 pg — ABNORMAL HIGH (ref 26.0–34.0)
MCHC: 34.7 g/dL (ref 30.0–36.0)
MCV: 101.4 fL — ABNORMAL HIGH (ref 80.0–100.0)
Platelets: 102 10*3/uL — ABNORMAL LOW (ref 150–400)
RBC: 3.47 MIL/uL — ABNORMAL LOW (ref 3.87–5.11)
RDW: 14.8 % (ref 11.5–15.5)
WBC: 6.4 10*3/uL (ref 4.0–10.5)
nRBC: 0 % (ref 0.0–0.2)

## 2023-12-11 LAB — GLUCOSE, CAPILLARY
Glucose-Capillary: 89 mg/dL (ref 70–99)
Glucose-Capillary: 92 mg/dL (ref 70–99)
Glucose-Capillary: 99 mg/dL (ref 70–99)
Glucose-Capillary: 92 mg/dL (ref 70–99)
Glucose-Capillary: 86 mg/dL (ref 70–99)

## 2023-12-11 LAB — MAGNESIUM: Magnesium: 2.2 mg/dL (ref 1.7–2.4)

## 2023-12-11 LAB — PHOSPHORUS: Phosphorus: 2.6 mg/dL (ref 2.5–4.6)

## 2023-12-11 MED ORDER — POTASSIUM CHLORIDE 20 MEQ PO PACK
40.0000 meq | PACK | ORAL | Status: AC
  Administered 2023-12-11 (×2): 40 meq via ORAL
  Filled 2023-12-11 (×2): qty 2

## 2023-12-11 MED ORDER — POTASSIUM CHLORIDE 20 MEQ PO PACK: 40.0000 meq | PACK | Freq: Two times a day (BID) | ORAL | Status: DC

## 2023-12-11 MED ORDER — BISMUTH SUBSALICYLATE 262 MG PO CHEW
262.0000 mg | CHEWABLE_TABLET | Freq: Two times a day (BID) | ORAL | Status: DC
  Administered 2023-12-11 – 2023-12-12 (×4): 262 mg via ORAL
  Filled 2023-12-11 (×5): qty 1

## 2023-12-11 MED ORDER — POTASSIUM CHLORIDE CRYS ER 20 MEQ PO TBCR
40.0000 meq | EXTENDED_RELEASE_TABLET | ORAL | Status: AC
  Filled 2023-12-11: qty 2

## 2023-12-11 MED ORDER — PANTOPRAZOLE SODIUM 40 MG IV SOLR
40.0000 mg | Freq: Two times a day (BID) | INTRAVENOUS | Status: DC
  Administered 2023-12-11 – 2023-12-12 (×2): 40 mg via INTRAVENOUS
  Filled 2023-12-11 (×2): qty 10

## 2023-12-11 NOTE — Progress Notes (Signed)
 Patient ID: Kim [name], female   DOB: 10/22/1961, 63 y.o.   MRN: [id number]    Progress Note   Subjective   Day # 6 CC: Nausea and vomiting, obstipation, H. pylori gastropathy  Still complaining of nausea, not eating much but says she is able to take liquids and supplements, still passing diarrheal type stools  2 stools recorded today thus far, 4 stools recorded yesterday  CT abdomen and pelvis 12/06/2023 significant diverticulosis, no diverticulitis, no evidence for bowel obstruction and no mention of significant fecal burden KUB from 12/05/2023 moderate to large stool burden  Labs today-WBC 6.4/hemoglobin 12.2/hematocrit 35.2 platelets 102 Potassium 3.0/BUN less than 5/creatinine 1.04  Objective   Vital signs in last 24 hours: Temp:  [97.4 F (36.3 C)-98.8 F (37.1 C)] 98.8 F (37.1 C) (02/10 0910) Pulse Rate:  [78-101] 86 (02/10 0910) Resp:  [18] 18 (02/10 0910) BP: (103-122)/(44-69) 105/44 (02/10 0910) SpO2:  [97 %-100 %] 98 % (02/10 0910) Last BM Date : 12/09/23 General: Older   white female in NAD, daughter at bedside Heart:  Regular rate and rhythm; no murmurs Lungs: Respirations even and unlabored, lungs CTA bilaterally Abdomen:  Soft, obese, and nondistended. Normal bowel sounds. Extremities:  Without edema. Neurologic:  Alert and oriented,  grossly normal neurologically, responses a bit slow but appropriate Psych:  Cooperative. Normal mood and affect.  Intake/Output from previous day: 02/09 0701 - 02/10 0700 In: -  Out: 700 [Urine:700] Intake/Output this shift: Total I/O In: 237 [P.O.:237] Out: 1050 [Urine:1050]  Lab Results: Recent Labs    12/10/23 0705 12/10/23 1414 12/11/23 0648  WBC 7.0 7.4 6.4  HGB 12.4 11.8* 12.2  HCT 35.3* 33.5* 35.2*  PLT 103* 100* 102*   BMET Recent Labs    12/09/23 0611 12/10/23 0705 12/11/23 0648  NA 126* 128* 134*  K 3.5 3.3* 3.0*  CL 91* 91* 97*  CO2 23 26 27   GLUCOSE 77 86 85  BUN 11 8 <5*  CREATININE 0.98  1.00 1.04*  CALCIUM  8.3* 8.1* 8.3*   LFT No results for input(s): "PROT", "ALBUMIN", "AST", "ALT", "ALKPHOS", "BILITOT", "BILIDIR", "IBILI" in the last 72 hours. PT/INR No results for input(s): "LABPROT", "INR" in the last 72 hours.      Assessment / Plan:     #76 63 year old female admitted with persistent nausea and vomiting in setting of multiple other significant comorbidities including chronic respiratory failure on home oxygen /COPD, sleep apnea, chronic kidney disease stage II and polyneuropathy She had a recent diagnosis of H. pylori gastritis but was unable to complete the course of antibiotics to problems with nausea and vomiting and large size of the tablets.  She also had not had a bowel movement for about a week at the time of admission.  No prior colonoscopy  Currently being treated for the H. pylori gastritis with IV ampicillin , metronidazole  and clarithromycin   Continue Protonix  40 mg daily  Continue around-the-clock IV Zofran  and scopolamine  patch  May need gastric emptying scan as an outpatient, she also may be a good candidate for [facility] outpatient which we cannot start here  #2 diarrhea-which is felt to be overflow diarrhea-multiple bowel movements recorded on Saturday, 4 bowel movements recorded on Sunday and 2 thus far today Will repeat KUB today to see if she still has any significant stool burden so we can adjust her bowel regimen Will go ahead and order 1 enema today/tapwater  Principal Problem:   Intractable vomiting with nausea Active Problems:   Intractable nausea  and vomiting   H. pylori infection   Aortic atherosclerosis (HCC)   Constipation     LOS: 4 days   Melodi Happel PA-C 12/11/2023, 12:09 PM

## 2023-12-11 NOTE — Progress Notes (Signed)
 HD#4 Subjective:  Overnight Events: daughter concerned about drowsiness. Neuro exam with mild R mouth droop, AxOx3, drowsy. Difficulty voiding with 300cc post void, in and out cath completed.  She appears drowsy this morning. Is able to follow commands and answers questions. She ate some of omelet this morning and denies nausea at this time.   Pt is updated on the plan for today, and all questions and concerns are addressed.   Objective:  Vital signs in last 24 hours: Vitals:   12/10/23 2313 12/11/23 0501 12/11/23 0820 12/11/23 0910  BP: (!) 122/57 120/69  (!) 105/44  Pulse: (!) 101 83 93 86  Resp:  18 18 18   Temp:  (!) 97.4 F (36.3 C)  98.8 F (37.1 C)  TempSrc:    Oral  SpO2:  100% 97% 98%  Weight:      Height:       Supplemental O2: Nasal Cannula SpO2: 98 % O2 Flow Rate (L/min): 2 L/min FiO2 (%): 100 %   Physical Exam:  Constitutional: chronically ill-appearing, in no acute distress, sleepy Cardiovascular: regular rate and rhythm, no m/r/g Pulmonary/Chest: normal work of breathing on room air, lungs clear to auscultation bilaterally Abdominal: soft, non-tender, non-distended, bowel sounds present, bladder is palpable and some discomfort with palpation in suprapubic region MSK: no lower extremity edema Neurological: drowsy, moves all extremities spontaneously, no notable speech or muscle changes of face Skin: warm and dry  Filed Weights   12/05/23 1748  Weight: 74.4 kg     Intake/Output Summary (Last 24 hours) at 12/11/2023 1128 Last data filed at 12/11/2023 1112 Gross per 24 hour  Intake 237 ml  Output 1750 ml  Net -1513 ml   Net IO Since Admission: 1,121.29 mL [12/11/23 1128]  Pertinent Labs:    Latest Ref Rng & Units 12/11/2023    6:48 AM 12/10/2023    2:14 PM 12/10/2023    7:05 AM  CBC  WBC 4.0 - 10.5 K/uL 6.4  7.4  7.0   Hemoglobin 12.0 - 15.0 g/dL 40.9  81.1  91.4   Hematocrit 36.0 - 46.0 % 35.2  33.5  35.3   Platelets 150 - 400 K/uL 102  100   103        Latest Ref Rng & Units 12/11/2023    6:48 AM 12/10/2023    7:05 AM 12/09/2023    6:11 AM  CMP  Glucose 70 - 99 mg/dL 85  86  77   BUN 8 - 23 mg/dL 5  8  11    Creatinine 0.44 - 1.00 mg/dL 7.82  9.56  2.13   Sodium 135 - 145 mmol/L 134  128  126   Potassium 3.5 - 5.1 mmol/L 3.0  3.3  3.5   Chloride 98 - 111 mmol/L 97  91  91   CO2 22 - 32 mmol/L 27  26  23    Calcium  8.9 - 10.3 mg/dL 8.3  8.1  8.3    Assessment/Plan:   Principal Problem:   Intractable vomiting with nausea Active Problems:   Intractable nausea and vomiting   H. pylori infection   Aortic atherosclerosis (HCC)   Constipation   Patient Summary: Kim [name] is a 63 y.o. with a pertinent PMH of COPD on 3L prn, HTN, CKD, recent diagnosis of H pylori gastritis, who presented with nausea and vomiting and admitted on 2/5 for persistent nausea and vomiting on HD#4.   H pylori gastritis Persistent nausea and vomiting She was sleepy  this morning and will be re-examined this afternoon. She was able to eat some of omelet this morning and denies current nausea. Bowel movements have decreased with removing miralax . Her sleepiness this AM is a bit concerning with her being on scoplamine patch, could also be from addition of mirtazapine  2/9. Will re-evaluate this afternoon and consider discontinuing patch if remains sedated.  GI is considering solid phase gastric emptying study if appetite improves versus empiric trial of prokinetic agent. -f/u GI recommendations -continue IV H pylori treatment with ampicillin , metronidazole , bismuth  BID, and clarithromycin  (4/14) -continue PPI 40 mg IV BID -scopolamine  patch q 72  hours -scheduled antiemetic with ondansetron  8mg  IV q 8 hrs  Hyponatremia Hypokalemia Sodium improved to 134 today. K at 3.4  -trend BMP -replete K  Urinary retention 1 time in and out cath overnight with 300 cc on PVR. Receiving scopolamine  patch.  -repeat bladder scan this AM.  Chronic stable  conditions: CKD IIIa Creatinine returned to baseline.  Neuropathic leg pain Has taken gabapentin  in the past - Capsaicin  cream PRN - Tylenol  650mg  q6h prn - Duloxetine  60mg  daily  Thrombocytopenia  Has remained stable in 100s. Peripheral smear completed 2/9 and was unremarkable.  Stage I pressure injury: Present on admission. Per WOC, skin care order set will provide sufficient care COPD: Home incruse Ellipta , SpO2 goal 88-92% Constipation: holding miralax  as multiple bowel movements over weekend Hypertension: Normotensive, holding home amlodipine , restart when appropriate  HLD: Home pravastatin  20mg  Infrarenal abdominal aortic aneurysm: stable, abdominal ultrasound q3 years  Diet: Normal IVF: None,None VTE: Enoxaparin  Code: Full PT/OT recs: SNF for Subacute PT however patient prefers home health, none. Family Update:   Dispo: Anticipated discharge to Home pending improvement in nausea/ vomiting.   Beau Vanduzer M. Teliah Buffalo, D.O.  Internal Medicine Resident, PGY-3 [facility]  Pager: [phone] 11:28 AM, 12/11/2023   **Please contact the on call pager after 5 pm and on weekends at [phone].**

## 2023-12-11 NOTE — Progress Notes (Signed)
 Physical Therapy Treatment Patient Details Name: Kim [name] MRN: [id number] DOB: 10-14-1961 Today's Date: 12/11/2023   History of Present Illness Pt is a 63 y/o F presenting to ED on 2/4 wtih poor appetite, vomiting, admittedf or intractabel n/v. PMH includes h. pylori and gastritis, COPD on 3L O2, HTN, CKD    PT Comments  Pt received in supine and agreeable to session. Pt able to sit to EOB and stand with up to CGA for safety this session. Pt reports B foot pain due to neuropathy limiting WB tolerance. Pt able to tolerate static standing marches at EOB for ~ 20 seconds, however demonstrates one R lateral LOB requiring assist to correct and a posterior LOB while sitting back to EOB. Pt declines further trials due to increased fatigue. Pt reports experiencing multiple LOB PTA, but was able to correct before falling. Pt continues to benefit from PT services to progress toward functional mobility goals.    If plan is discharge home, recommend the following: A little help with walking and/or transfers;A little help with bathing/dressing/bathroom;Assistance with cooking/housework;Assist for transportation;Help with stairs or ramp for entrance   Can travel by private vehicle     Yes  Equipment Recommendations  Other (comment) (per accepting facility)    Recommendations for Other Services       Precautions / Restrictions Precautions Precautions: Fall Precaution Comments: 3L O2 baseline, intermittently? Restrictions Weight Bearing Restrictions Per Provider Order: No     Mobility  Bed Mobility Overal bed mobility: Needs Assistance Bed Mobility: Supine to Sit, Sit to Supine     Supine to sit: Supervision, HOB elevated, Used rails Sit to supine: Contact guard assist   General bed mobility comments: increased time    Transfers Overall transfer level: Needs assistance Equipment used: Rolling walker (2 wheels) Transfers: Sit to/from Stand Sit to Stand: Contact guard assist            General transfer comment: From EOB x2 with CGA for safety and increased time for rise    Ambulation/Gait             Pre-gait activities: marching at EOB         Balance Overall balance assessment: Needs assistance Sitting-balance support: Feet supported, Bilateral upper extremity supported Sitting balance-Leahy Scale: Good Sitting balance - Comments: sitting EOB   Standing balance support: During functional activity, Reliant on assistive device for balance, Bilateral upper extremity supported Standing balance-Leahy Scale: Poor Standing balance comment: with RW support                            Cognition Arousal: Alert Behavior During Therapy: WFL for tasks assessed/performed Overall Cognitive Status: Within Functional Limits for tasks assessed                                          Exercises Other Exercises Other Exercises: static standing marches for ~20 seconds    General Comments        Pertinent Vitals/Pain Pain Assessment Pain Assessment: Faces Faces Pain Scale: Hurts even more Pain Location: B feet Pain Descriptors / Indicators: Sore, Guarding, Grimacing Pain Intervention(s): Limited activity within patient's tolerance, Monitored during session     PT Goals (current goals can now be found in the care plan section) Acute Rehab PT Goals Patient Stated Goal: to go home PT Goal Formulation: With  patient Time For Goal Achievement: 12/23/23 Progress towards PT goals: Progressing toward goals    Frequency    Min 1X/week       AM-PAC PT "6 Clicks" Mobility   Outcome Measure  Help needed turning from your back to your side while in a flat bed without using bedrails?: A Little Help needed moving from lying on your back to sitting on the side of a flat bed without using bedrails?: A Little Help needed moving to and from a bed to a chair (including a wheelchair)?: A Little Help needed standing up from a  chair using your arms (e.g., wheelchair or bedside chair)?: A Little Help needed to walk in hospital room?: A Lot Help needed climbing 3-5 steps with a railing? : A Lot 6 Click Score: 16    End of Session Equipment Utilized During Treatment: Gait belt Activity Tolerance: Patient limited by fatigue Patient left: in bed;with call bell/phone within reach;with bed alarm set Nurse Communication: Mobility status PT Visit Diagnosis: Other abnormalities of gait and mobility (R26.89)     Time: 9147-8295 PT Time Calculation (min) (ACUTE ONLY): 15 min  Charges:    $Therapeutic Activity: 8-22 mins PT General Charges $$ ACUTE PT VISIT: 1 Visit                     Michaelle Adolphus, PTA Acute Rehabilitation Services Secure Chat Preferred  [phone]    Michaelle Adolphus 12/11/2023, 1:05 PM

## 2023-12-11 NOTE — Plan of Care (Signed)

## 2023-12-11 NOTE — Progress Notes (Signed)
 Mobility Specialist Progress Note:   12/11/23 1502  Mobility  Activity Transferred to/from [facility]  Level of Assistance Minimal assist, patient does 75% or more  Assistive Device Other (Comment) (HHA)  Distance Ambulated (ft) 2 ft  Activity Response Tolerated well  Mobility Referral Yes  Mobility visit 1 Mobility  Mobility Specialist Start Time (ACUTE ONLY) 1445  Mobility Specialist Stop Time (ACUTE ONLY) 1455  Mobility Specialist Time Calculation (min) (ACUTE ONLY) 10 min   Pt received on BSC, void successful, peri care performed. Required MinA to stand and pivot to bed. Tolerated well, asx throughout. Bed alarm on, call bell in reach, all needs met.   Kamera Dubas Mobility Specialist Please contact via Special educational needs teacher or  Rehab office at [phone]

## 2023-12-11 NOTE — Progress Notes (Signed)
 Patient re-evaluated this afternoon. She appears more awake and alert. Her daughter was not at bedside and unable to reach her by phone.  P: Low-threshold to stop scopolamine  patch if has urinary retention or increased confusion F/u KUB

## 2023-12-11 NOTE — TOC Progression Note (Signed)
 Transition of Care [facility]) - Progression Note    Patient Details  Name: Kim [name] MRN: [id number] Date of Birth: 1961/05/26  Transition of [facility]) CM/SW Contact  Dane Dung, RN Phone Number: 12/11/2023, 11:00 AM  Clinical Narrative:    CM met with the patient at the bedside to discuss TOC needs for home health services.  The patient's husband expressed to [place], MSW and states that he declined SNF placement and would prefer home health services.  I spoke with the patient  - no family present at the bedside at this time.  Patient did not have a preference for home health services if possible.  I called [facility] and [facility] is checking with [place] branch regarding staff availability.   Expected Discharge Plan: Home w Home Health Services Barriers to Discharge: Continued Medical Work up  Expected Discharge Plan and Services     Post Acute Care Choice: Home Health Living arrangements for the past 2 months: Single Family Home                                       Social Determinants of Health (SDOH) Interventions SDOH Screenings   Food Insecurity: No Food Insecurity (12/06/2023)  Housing: Low Risk  (12/06/2023)  Transportation Needs: No Transportation Needs (12/06/2023)  Utilities: Not At Risk (12/06/2023)  Recent Concern: Utilities - At Risk (10/19/2023)  Depression (PHQ2-9): Low Risk  (10/11/2023)  Social Connections: Unknown (12/06/2023)  Tobacco Use: High Risk (12/06/2023)    Readmission Risk Interventions    12/11/2023   11:00 AM 05/09/2022   12:02 PM  Readmission Risk Prevention Plan  Post Dischage Appt  Complete  Medication Screening  Complete  Transportation Screening Complete Complete  PCP or Specialist Appt within 5-7 Days Complete   Home Care Screening Complete   Medication Review (RN CM) Complete

## 2023-12-12 DIAGNOSIS — K2970 Gastritis, unspecified, without bleeding: Secondary | ICD-10-CM | POA: Diagnosis not present

## 2023-12-12 DIAGNOSIS — E860 Dehydration: Secondary | ICD-10-CM

## 2023-12-12 DIAGNOSIS — M792 Neuralgia and neuritis, unspecified: Secondary | ICD-10-CM

## 2023-12-12 DIAGNOSIS — B9681 Helicobacter pylori [H. pylori] as the cause of diseases classified elsewhere: Secondary | ICD-10-CM | POA: Diagnosis not present

## 2023-12-12 DIAGNOSIS — A048 Other specified bacterial intestinal infections: Secondary | ICD-10-CM | POA: Diagnosis not present

## 2023-12-12 DIAGNOSIS — K5900 Constipation, unspecified: Secondary | ICD-10-CM | POA: Diagnosis not present

## 2023-12-12 DIAGNOSIS — R1013 Epigastric pain: Secondary | ICD-10-CM | POA: Diagnosis not present

## 2023-12-12 DIAGNOSIS — R112 Nausea with vomiting, unspecified: Secondary | ICD-10-CM | POA: Diagnosis not present

## 2023-12-12 LAB — CBC
HCT: 33.7 % — ABNORMAL LOW (ref 36.0–46.0)
Hemoglobin: 11.5 g/dL — ABNORMAL LOW (ref 12.0–15.0)
MCH: 35.3 pg — ABNORMAL HIGH (ref 26.0–34.0)
MCHC: 34.1 g/dL (ref 30.0–36.0)
MCV: 103.4 fL — ABNORMAL HIGH (ref 80.0–100.0)
Platelets: 110 10*3/uL — ABNORMAL LOW (ref 150–400)
RBC: 3.26 MIL/uL — ABNORMAL LOW (ref 3.87–5.11)
RDW: 15.2 % (ref 11.5–15.5)
WBC: 6.6 10*3/uL (ref 4.0–10.5)
nRBC: 0 % (ref 0.0–0.2)

## 2023-12-12 LAB — BASIC METABOLIC PANEL
Anion gap: 6 (ref 5–15)
BUN: <5 mg/dL — ABNORMAL LOW (ref 8–23)
CO2: 26 mmol/L (ref 22–32)
Calcium: 8.4 mg/dL — ABNORMAL LOW (ref 8.9–10.3)
Chloride: 102 mmol/L (ref 98–111)
Creatinine, Ser: 1.16 mg/dL — ABNORMAL HIGH (ref 0.44–1.00)
GFR, Estimated: 53 mL/min — ABNORMAL LOW (ref >60)
Glucose, Bld: 84 mg/dL (ref 70–99)
Potassium: 3.5 mmol/L (ref 3.5–5.1)
Sodium: 134 mmol/L — ABNORMAL LOW (ref 135–145)

## 2023-12-12 LAB — GLUCOSE, CAPILLARY
Glucose-Capillary: 96 mg/dL (ref 70–99)
Glucose-Capillary: 84 mg/dL (ref 70–99)
Glucose-Capillary: 106 mg/dL — ABNORMAL HIGH (ref 70–99)

## 2023-12-12 MED ORDER — ONDANSETRON 4 MG PO TBDP
8.0000 mg | ORAL_TABLET | Freq: Three times a day (TID) | ORAL | Status: DC
  Administered 2023-12-12 – 2023-12-14 (×6): 8 mg via ORAL
  Filled 2023-12-12 (×6): qty 2

## 2023-12-12 MED ORDER — METRONIDAZOLE 500 MG PO TABS
500.0000 mg | ORAL_TABLET | Freq: Two times a day (BID) | ORAL | Status: DC
  Administered 2023-12-12 – 2023-12-14 (×4): 500 mg via ORAL
  Filled 2023-12-12 (×4): qty 1

## 2023-12-12 MED ORDER — AMOXICILLIN 400 MG/5ML PO SUSR
1000.0000 mg | Freq: Two times a day (BID) | ORAL | Status: DC
  Administered 2023-12-12 – 2023-12-14 (×5): 1000 mg via ORAL
  Filled 2023-12-12: qty 15
  Filled 2023-12-12 (×6): qty 12.5

## 2023-12-12 MED ORDER — ENSURE ENLIVE PO LIQD
237.0000 mL | Freq: Three times a day (TID) | ORAL | Status: DC
Start: 2023-12-12 — End: 2023-12-14
  Administered 2023-12-12 (×2): 237 mL via ORAL

## 2023-12-12 MED ORDER — PANTOPRAZOLE SODIUM 40 MG PO TBEC
40.0000 mg | DELAYED_RELEASE_TABLET | Freq: Two times a day (BID) | ORAL | Status: DC
  Administered 2023-12-12 – 2023-12-14 (×4): 40 mg via ORAL
  Filled 2023-12-12 (×4): qty 1

## 2023-12-12 MED ORDER — POLYETHYLENE GLYCOL 3350 17 G PO PACK
17.0000 g | PACK | Freq: Every day | ORAL | Status: DC
  Administered 2023-12-12 – 2023-12-14 (×3): 17 g via ORAL
  Filled 2023-12-12 (×3): qty 1

## 2023-12-12 MED ORDER — LIDOCAINE 5 % EX OINT
TOPICAL_OINTMENT | Freq: Three times a day (TID) | CUTANEOUS | Status: DC
  Administered 2023-12-13: 1 via TOPICAL
  Filled 2023-12-12: qty 35.44

## 2023-12-12 MED ORDER — METOCLOPRAMIDE HCL 5 MG PO TABS
5.0000 mg | ORAL_TABLET | Freq: Three times a day (TID) | ORAL | Status: DC
  Administered 2023-12-13 – 2023-12-14 (×6): 5 mg via ORAL
  Filled 2023-12-12 (×7): qty 1

## 2023-12-12 MED ORDER — RIVAROXABAN 10 MG PO TABS
10.0000 mg | ORAL_TABLET | Freq: Every day | ORAL | Status: DC
  Administered 2023-12-13 – 2023-12-14 (×2): 10 mg via ORAL
  Filled 2023-12-12 (×2): qty 1

## 2023-12-12 NOTE — Progress Notes (Signed)
Patient ID: Kim [name], female   DOB: Mar 06, 1961, 63 y.o.   MRN: [id number]    Progress Note   Subjective   Day # 7 CC; nausea and vomiting, constipation, H. pylori gastritis  KUB yesterday shows no significant fecal retention normal gas pattern  CBC today WBC 6.6/hemoglobin 11.5/hematocrit 33.7/MCV 103  Patient continues to complain of nausea but says she was able to keep some solid food down this morning she denies any dysphagia or odynophagia to me.  No diarrhea this morning.  Daughter at bedside with multiple questions as medicine team suggested that patient may be ready for discharge tomorrow.  She does not feel that her mother is taking enough p.o. at this point She also had questions about her current medications etc.  They do have a prescription for Pylera at home however mom unable to swallow this as the tablet was too big.  She is asking if this can be broken up since it is a capsule-not appropriate to open the capsule for this medication. Also asking about gastric emptying scan   Objective   Vital signs in last 24 hours: Temp:  [97.6 F (36.4 C)-98 F (36.7 C)] 97.6 F (36.4 C) (02/11 0823) Pulse Rate:  [89-94] 89 (02/11 0823) Resp:  [14-18] 17 (02/11 0823) BP: (109-121)/(65-71) 116/65 (02/11 0823) SpO2:  [90 %-98 %] 90 % (02/11 0823) Last BM Date : 12/11/23 General:    Older white female in NAD Heart:  Regular rate and rhythm; no murmurs Lungs: Respirations even and unlabored, lungs CTA bilaterally Abdomen: Obese , and nondistended. Normal bowel sounds. Extremities:  Without edema. Neurologic:  Alert and oriented,  grossly normal neurologically.  Seems somewhat sleepy Psych:  Cooperative. Normal mood and affect.  Intake/Output from previous day: 02/10 0701 - 02/11 0700 In: 2854 [P.O.:574; IV Piggyback:2280] Out: 2650 [Urine:2650] Intake/Output this shift: Total I/O In: 236 [P.O.:236] Out: -   Lab Results: Recent Labs    12/10/23 1414 12/11/23 0648  12/12/23 0604  WBC 7.4 6.4 6.6  HGB 11.8* 12.2 11.5*  HCT 33.5* 35.2* 33.7*  PLT 100* 102* 110*   BMET Recent Labs    12/10/23 0705 12/11/23 0648 12/12/23 0604  NA 128* 134* 134*  K 3.3* 3.0* 3.5  CL 91* 97* 102  CO2 26 27 26   GLUCOSE 86 85 84  BUN 8 <5* <5*  CREATININE 1.00 1.04* 1.16*  CALCIUM 8.1* 8.3* 8.4*   LFT No results for input(s): "PROT", "ALBUMIN", "AST", "ALT", "ALKPHOS", "BILITOT", "BILIDIR", "IBILI" in the last 72 hours. PT/INR No results for input(s): "LABPROT", "INR" in the last 72 hours.  Studies/Results: DG Abd 2 Views Result Date: 12/11/2023 CLINICAL DATA:  Obstipation EXAM: ABDOMEN - 2 VIEW COMPARISON:  12/06/2023 FINDINGS: Supine and upright frontal views of the abdomen and pelvis are obtained. No bowel obstruction or ileus. No significant fecal retention. No masses or abnormal calcifications. No free gas in the greater peritoneal sac. Lung bases are clear. No acute bony abnormalities. IMPRESSION: 1. Unremarkable bowel gas pattern. Electronically Signed   By: Sharlet Salina M.D.   On: 12/11/2023 17:28       Assessment / Plan:     #55 63 year old white female admitted with persistent nausea and vomiting, recent diagnosis of H. pylori but unable to take the antibiotics  She is doing a bit better with p.o. intake but certainly has not demonstrated that she can consistently keep down any solids  Continues currently on IV regimen for H. pylori, will need  to convert to p.o. to complete an outpatient regimen  I think we need to finish treatment for H. pylori, and continue management for her peptic ulcer disease prior to considering gastric emptying scan.  These are usually low yield during hospitalization as very likely to be abnormal and not change our management  We can give her a trial of metoclopramide.  #2  obstipation-resolved-keep her on daily MiraLAX 17 g in 8 ounces of water #3 chronic respiratory failure, on home O2 #4.  COPD #5.  Sleep  apnea #6.  Chronic kidney disease stage II #7.  Polyneuropathy  Plan; continue twice daily PPI Continue IV regimen for H. pylori today, then if tolerating p.o.'s can convert to oral equivalent.  She is not can to be able to take the Pylera that they have at home as the capsule is too big, and not appropriate to open the capsule Will stop scopolamine Add trial of metoclopramide 5 mg p.o. 30 minutes AC MiraLAX 17 g daily Will be able to discharge in the next day or 2         Principal Problem:   Intractable vomiting with nausea Active Problems:   Intractable nausea and vomiting   H. pylori infection   Aortic atherosclerosis (HCC)   Constipation   Dehydration   Neuropathic pain     LOS: 5 days   Laquiesha Piacente EsterwoodPA-C  12/12/2023, 12:25 PM

## 2023-12-12 NOTE — Plan of Care (Signed)

## 2023-12-12 NOTE — Progress Notes (Signed)
HD#5 Subjective:  Overnight Events: none  Her daughter is at bedside this morning. Kim [name] continues to feel nauseated at times and appetite is poor. She has chronic nerve pain in her feet and her hands that is bothersome to her. She feels like she doesn't sleep well at night due to this. She would like to be discontinued off of cymbalta as family is worried this is making her more sleepy. They would also like a GI doctor to talk with them to clarify about additional procedures needed prior to discharge. Her daughter reports that she spit up a peach when she was trying to eat it this morning.  Pt is updated on the plan for today, and all questions and concerns are addressed.   Objective:  Vital signs in last 24 hours: Vitals:   12/11/23 1010 12/11/23 1158 12/11/23 2052 12/12/23 0429  BP:  118/61 109/71 121/71  Pulse:  88 90 94  Resp: 16 16 18 14   Temp:  98.2 F (36.8 C) 97.9 F (36.6 C) 98 F (36.7 C)  TempSrc:  Oral  Oral  SpO2:  94% 98% 92%  Weight:      Height:       Supplemental O2: Nasal Cannula SpO2: 92 % O2 Flow Rate (L/min): 2 L/min FiO2 (%): 100 %   Physical Exam:  Constitutional: appears more alert than day prior, but still falling asleep during conversation Cardiovascular: regular rate and rhythm, no m/r/g Pulmonary/Chest: normal work of breathing on room air, lungs clear to auscultation bilaterally Abdominal: bowel sounds present, soft, no tenderness present MSK: no lower extremity edema Neurological: alert and oriented x3 Skin: warm and dry  Filed Weights   12/05/23 1748  Weight: 74.4 kg     Intake/Output Summary (Last 24 hours) at 12/12/2023 0618 Last data filed at 12/12/2023 0547 Gross per 24 hour  Intake 2853.98 ml  Output 2650 ml  Net 203.98 ml   Net IO Since Admission: 2,138.27 mL [12/12/23 0618]  Pertinent Labs:    Latest Ref Rng & Units 12/11/2023    6:48 AM 12/10/2023    2:14 PM 12/10/2023    7:05 AM  CBC  WBC 4.0 - 10.5 K/uL 6.4  7.4   7.0   Hemoglobin 12.0 - 15.0 g/dL 16.1  09.6  04.5   Hematocrit 36.0 - 46.0 % 35.2  33.5  35.3   Platelets 150 - 400 K/uL 102  100  103        Latest Ref Rng & Units 12/11/2023    6:48 AM 12/10/2023    7:05 AM 12/09/2023    6:11 AM  CMP  Glucose 70 - 99 mg/dL 85  86  77   BUN 8 - 23 mg/dL 5  8  11    Creatinine 0.44 - 1.00 mg/dL 4.09  8.11  9.14   Sodium 135 - 145 mmol/L 134  128  126   Potassium 3.5 - 5.1 mmol/L 3.0  3.3  3.5   Chloride 98 - 111 mmol/L 97  91  91   CO2 22 - 32 mmol/L 27  26  23    Calcium 8.9 - 10.3 mg/dL 8.3  8.1  8.3    Assessment/Plan:   Principal Problem:   Intractable vomiting with nausea Active Problems:   Intractable nausea and vomiting   H. pylori infection   Aortic atherosclerosis (HCC)   Constipation   Patient Summary: Kim [name] is a 63 y.o. with a pertinent PMH of COPD on 3L  prn, HTN, CKD, recent diagnosis of H pylori gastritis, who presented with nausea and vomiting and admitted on 2/5 for persistent nausea and vomiting on HD#5.   H pylori gastritis Persistent nausea She has not vomited in the last few days. She continues to have some nausea. Differentials remain with gastroparesis versus nausea from known h pylori gastritis.   GI with recommendations for discontinuing scopolamine and trial of low-dose metoclopramide. Today, we will transition her form IV treatment for H pylori to PO.  -change IV H pylori treatment with ampicillin, metronidazole, bismuth BID tablets, and clarithromycin (5/14) to amoxicillin suspension, bismuth PO, clarithromycin suspension, metronidazole 500 mg q 12 hours -switch PPI 40 mg bid to po -start metoclopromide 5mg  TID -stop scopolamine patch q 72  hours -scheduled antiemetic with ondansetron 8mg  PO q 8 hrs  Hyponatremia-resolved Hypokalemia- resolved -trend BMP  Urinary retention-resolved  Chronic stable conditions: CKD IIIa Creatinine returned to baseline.  Neuropathic leg pain Has taken gabapentin in  the past. She does not want to take cymbalta. With her overal sleepiness, would not want to give her sedating medications. - Capsaicin cream PRN - Tylenol 650mg  q6h prn - discontinue Duloxetine 60mg  daily  Thrombocytopenia  Has remained stable in 100s. Peripheral smear completed 2/9 and was unremarkable.  Stage I pressure injury: Present on admission. Per WOC, skin care order set will provide sufficient care COPD: Home incruse Ellipta, SpO2 goal 88-92% Constipation: holding miralax as multiple bowel movements over weekend Hypertension: Normotensive, holding home amlodipine, restart when appropriate  HLD: Home pravastatin 20mg  Infrarenal abdominal aortic aneurysm: stable, abdominal ultrasound q3 years  Diet: Normal IVF: None,None VTE: Enoxaparin Code: Full PT/OT recs: SNF for Subacute PT daughter is now interested in SNF Family Update: daughter updated at bedside   Dispo: Anticipated discharge to Skilled nursing facility pending improvement in nausea/ vomiting.   Kim [name], D.O.  Internal Medicine Resident, PGY-3 [facility]  Pager: [phone] 6:18 AM, 12/12/2023   **Please contact the on call pager after 5 pm and on weekends at [phone].**

## 2023-12-12 NOTE — TOC Progression Note (Signed)
Transition of Care [facility]) - Progression Note    Patient Details  Name: Kim [name] MRN: [id number] Date of Birth: 10-Sep-1961  Transition of Care [facility]) CM/SW Contact  Janae Bridgeman, RN Phone Number: 12/12/2023, 12:18 PM  Clinical Narrative:    CM called and spoke with Adele Dan, CM with [facility] and she states that the [facility] branch reached out to the daughter to set up home health and the daughter states that she wanted the patient to go to short term rehabilitation before coming home.  MD Team was updated and SNF work up was completed and patient was faxed out in the hub for bed offers.  Cyrus, MSW is aware and will follow up with the patient/daughter.   Expected Discharge Plan: Home w Home Health Services Barriers to Discharge: Continued Medical Work up  Expected Discharge Plan and Services     Post Acute Care Choice: Home Health Living arrangements for the past 2 months: Single Family Home                                       Social Determinants of Health (SDOH) Interventions SDOH Screenings   Food Insecurity: No Food Insecurity (12/06/2023)  Housing: Low Risk  (12/06/2023)  Transportation Needs: No Transportation Needs (12/06/2023)  Utilities: Not At Risk (12/06/2023)  Recent Concern: Utilities - At Risk (10/19/2023)  Depression (PHQ2-9): Low Risk  (10/11/2023)  Social Connections: Unknown (12/06/2023)  Tobacco Use: High Risk (12/06/2023)    Readmission Risk Interventions    12/11/2023   11:00 AM 05/09/2022   12:02 PM  Readmission Risk Prevention Plan  Post Dischage Appt  Complete  Medication Screening  Complete  Transportation Screening Complete Complete  PCP or Specialist Appt within 5-7 Days Complete   Home Care Screening Complete   Medication Review (RN CM) Complete

## 2023-12-12 NOTE — Progress Notes (Signed)
Mobility Specialist: Progress Note   12/12/23 1242  Mobility  Activity Transferred from bed to chair  Level of Assistance Minimal assist, patient does 75% or more  Assistive Device Other (Comment) (HHA)  Activity Response Tolerated well  Mobility Referral Yes  Mobility visit 1 Mobility  Mobility Specialist Start Time (ACUTE ONLY) 1111  Mobility Specialist Stop Time (ACUTE ONLY) 1127  Mobility Specialist Time Calculation (min) (ACUTE ONLY) 16 min    Pt was agreeable to mobility session - received in bed. MinA for bed mobility to assist with trunk elevation, minA for STS and ambulation as well. C/o bil foot pain from neuropathy. Completed stand pivot to chair with HHA. Left in chair with all needs met, call bell in reach.   Maurene Capes Mobility Specialist Please contact via SecureChat or Rehab office at [phone]

## 2023-12-12 NOTE — Progress Notes (Signed)
OT Cancellation Note  Patient Details Name: Kim [name] MRN: [id number] DOB: 01-Dec-1960   Cancelled Treatment:    Reason Eval/Treat Not Completed: Patient declined, no reason specified (attempted this AM and pt reports too fatigued from sitting EOB for breakfast, reattempted this PM and pt sleepy, declines OOB mobility. Will follow up next date for OT tx as schedule permits)  Carver Fila, OTD, OTR/L SecureChat Preferred Acute Rehab [phone]   Dalphine Handing 12/12/2023, 1:35 PM

## 2023-12-12 NOTE — NC FL2 (Signed)
[place] MEDICAID FL2 LEVEL OF CARE FORM     IDENTIFICATION  Patient Name: Kim [name] Birthdate: 1961-10-07 Sex: female Admission Date (Current Location): 12/05/2023  [facility] and [place] Number:  Producer, television/film/video and Address:  [facility], [address], [place], [place] [address]      Provider Number: [id number]  Attending Physician Name and Address:  Mercie Eon, MD  Relative Name and Phone Number:  Arthor Captain - daughter - [phone]    Current Level of Care: Hospital Recommended Level of Care: Skilled Nursing Facility Prior Approval Number:    Date Approved/Denied:   PASRR Number: [id number]  Discharge Plan: SNF    Current Diagnoses: Patient Active Problem List   Diagnosis Date Noted   Dehydration 12/12/2023   Neuropathic pain 12/12/2023   Constipation 12/08/2023   H. pylori infection 12/07/2023   Aortic atherosclerosis (HCC) 12/07/2023   Intractable vomiting with nausea 12/06/2023   Intractable nausea and vomiting 12/04/2023   Helicobacter pylori gastritis 11/22/2023   Colon cancer screening 11/22/2023   Encounter for screening mammogram for malignant neoplasm of breast 11/22/2023   Taking multiple medications for chronic disease 11/22/2023   Multiple gastric ulcers 10/20/2023   PUD (peptic ulcer disease) 10/20/2023   Rapid weight loss, unintentional 10/18/2023   Weight loss, unintentional 10/18/2023   Pressure injury of skin 10/18/2023   Polyneuropathy 10/11/2023   Infrarenal abdominal aortic aneurysm (AAA) without rupture (HCC) 05/17/2022   Respiratory failure, chronic (HCC) 05/17/2022   Stable angina (HCC) 05/17/2022   Erythrocytosis 05/02/2022   Obesity, Class III, BMI 40-49.9 (morbid obesity) (HCC) 05/02/2022   CKD (chronic kidney disease), stage III (HCC) 05/02/2022   OSA (obstructive sleep apnea)    COPD (chronic obstructive pulmonary disease) (HCC) 09/02/2019   Essential hypertension 09/02/2019   HLD  (hyperlipidemia) 09/02/2019    Orientation RESPIRATION BLADDER Height & Weight     Self, Time, Situation, Place  Normal Continent Weight: 74.4 kg Height:  5\' 5"  (165.1 cm)  BEHAVIORAL SYMPTOMS/MOOD NEUROLOGICAL BOWEL NUTRITION STATUS      Incontinent Diet (See discharge summary)  AMBULATORY STATUS COMMUNICATION OF NEEDS Skin   Extensive Assist Verbally Other (Comment) (Bilateral Buttocks - stage 2)                       Personal Care Assistance Level of Assistance  Bathing, Feeding, Dressing Bathing Assistance: Limited assistance Feeding assistance: Limited assistance Dressing Assistance: Limited assistance     Functional Limitations Info  Hearing, Sight, Speech Sight Info: Adequate Hearing Info: Adequate Speech Info: Adequate    SPECIAL CARE FACTORS FREQUENCY  PT (By licensed PT), OT (By licensed OT)     PT Frequency: 5 x per week OT Frequency: 5 x per week            Contractures Contractures Info: Not present    Additional Factors Info  Code Status, Allergies Code Status Info: Full code Allergies Info: NKDA           Current Medications (12/12/2023):  This is the current hospital active medication list Current Facility-Administered Medications  Medication Dose Route Frequency Provider Last Rate Last Admin   acetaminophen (TYLENOL) tablet 650 mg  650 mg Oral Q6H PRN Rana Snare, DO   650 mg at 12/12/23 0544   Or   acetaminophen (TYLENOL) suppository 650 mg  650 mg Rectal Q6H PRN Rana Snare, DO       amoxicillin (AMOXIL) 400 MG/5ML suspension 1,000  mg  1,000 mg Oral Q12H Masters, Katie, DO       bisacodyl (DULCOLAX) EC tablet 5 mg  5 mg Oral Daily PRN Rana Snare, DO       bismuth subsalicylate (PEPTO BISMOL) chewable tablet 262 mg  262 mg Oral BID Leander Rams, RPH   262 mg at 12/12/23 0857   capsaicin (ZOSTRIX) 0.025 % cream   Topical PRN Jeral Pinch, DO   Given at 12/10/23 1112   clarithromycin (BIAXIN) 250 MG/5ML suspension 500 mg   500 mg Oral Q12H Gust Rung, DO   500 mg at 12/12/23 4098   feeding supplement (ENSURE ENLIVE / ENSURE PLUS) liquid 237 mL  237 mL Oral TID BM Masters, Katie, DO       lidocaine (XYLOCAINE) 5 % ointment   Topical TID Masters, Katie, DO       melatonin tablet 3 mg  3 mg Oral QHS Rana Snare, DO   3 mg at 12/11/23 2144   metroNIDAZOLE (FLAGYL) tablet 500 mg  500 mg Oral Q12H Masters, Katie, DO       nicotine (NICODERM CQ - dosed in mg/24 hours) patch 14 mg  14 mg Transdermal Daily Tawkaliyar, Roya, DO   14 mg at 12/12/23 0859   ondansetron (ZOFRAN-ODT) disintegrating tablet 8 mg  8 mg Oral Q8H Masters, Katie, DO       pantoprazole (PROTONIX) EC tablet 40 mg  40 mg Oral BID Masters, Katie, DO       pravastatin (PRAVACHOL) tablet 20 mg  20 mg Oral QHS Rana Snare, DO   20 mg at 12/11/23 2143   [START ON 12/13/2023] rivaroxaban (XARELTO) tablet 10 mg  10 mg Oral Daily Masters, Katie, DO       scopolamine (TRANSDERM-SCOP) 1 MG/3DAYS 1.5 mg  1 patch Transdermal Q72H Monna Fam, MD   1.5 mg at 12/11/23 0841   umeclidinium bromide (INCRUSE ELLIPTA) 62.5 MCG/ACT 1 puff  1 puff Inhalation Daily Rana Snare, DO   1 puff at 12/12/23 1191     Discharge Medications: Please see discharge summary for a list of discharge medications.  Relevant Imaging Results:  Relevant Lab Results:   Additional Information SS# [id number]  Janae Bridgeman, RN

## 2023-12-13 DIAGNOSIS — M792 Neuralgia and neuritis, unspecified: Secondary | ICD-10-CM | POA: Diagnosis not present

## 2023-12-13 DIAGNOSIS — K5900 Constipation, unspecified: Secondary | ICD-10-CM | POA: Diagnosis not present

## 2023-12-13 DIAGNOSIS — R1013 Epigastric pain: Secondary | ICD-10-CM | POA: Diagnosis not present

## 2023-12-13 DIAGNOSIS — E860 Dehydration: Secondary | ICD-10-CM | POA: Diagnosis not present

## 2023-12-13 DIAGNOSIS — R112 Nausea with vomiting, unspecified: Secondary | ICD-10-CM | POA: Diagnosis not present

## 2023-12-13 DIAGNOSIS — B9681 Helicobacter pylori [H. pylori] as the cause of diseases classified elsewhere: Secondary | ICD-10-CM | POA: Diagnosis not present

## 2023-12-13 DIAGNOSIS — A048 Other specified bacterial intestinal infections: Secondary | ICD-10-CM | POA: Diagnosis not present

## 2023-12-13 DIAGNOSIS — K2970 Gastritis, unspecified, without bleeding: Secondary | ICD-10-CM | POA: Diagnosis not present

## 2023-12-13 LAB — CBC
HCT: 34.6 % — ABNORMAL LOW (ref 36.0–46.0)
Hemoglobin: 11.7 g/dL — ABNORMAL LOW (ref 12.0–15.0)
MCH: 34.9 pg — ABNORMAL HIGH (ref 26.0–34.0)
MCHC: 33.8 g/dL (ref 30.0–36.0)
MCV: 103.3 fL — ABNORMAL HIGH (ref 80.0–100.0)
Platelets: 120 10*3/uL — ABNORMAL LOW (ref 150–400)
RBC: 3.35 MIL/uL — ABNORMAL LOW (ref 3.87–5.11)
RDW: 15.3 % (ref 11.5–15.5)
WBC: 7 10*3/uL (ref 4.0–10.5)
nRBC: 0 % (ref 0.0–0.2)

## 2023-12-13 LAB — BASIC METABOLIC PANEL
Anion gap: 8 (ref 5–15)
BUN: <5 mg/dL — ABNORMAL LOW (ref 8–23)
CO2: 25 mmol/L (ref 22–32)
Calcium: 8.4 mg/dL — ABNORMAL LOW (ref 8.9–10.3)
Chloride: 101 mmol/L (ref 98–111)
Creatinine, Ser: 1.19 mg/dL — ABNORMAL HIGH (ref 0.44–1.00)
GFR, Estimated: 51 mL/min — ABNORMAL LOW (ref >60)
Glucose, Bld: 86 mg/dL (ref 70–99)
Potassium: 3.4 mmol/L — ABNORMAL LOW (ref 3.5–5.1)
Sodium: 134 mmol/L — ABNORMAL LOW (ref 135–145)

## 2023-12-13 MED ORDER — POTASSIUM CHLORIDE 20 MEQ PO PACK
40.0000 meq | PACK | Freq: Once | ORAL | Status: AC
  Administered 2023-12-13: 40 meq via ORAL
  Filled 2023-12-13: qty 2

## 2023-12-13 MED ORDER — POTASSIUM CHLORIDE CRYS ER 20 MEQ PO TBCR
40.0000 meq | EXTENDED_RELEASE_TABLET | Freq: Once | ORAL | Status: DC
  Filled 2023-12-13: qty 2

## 2023-12-13 MED ORDER — BISMUTH SUBSALICYLATE 262 MG/15ML PO SUSP
30.0000 mL | Freq: Two times a day (BID) | ORAL | Status: DC
  Administered 2023-12-13 – 2023-12-14 (×3): 30 mL via ORAL

## 2023-12-13 NOTE — Plan of Care (Signed)

## 2023-12-13 NOTE — TOC Progression Note (Signed)
Transition of Care [facility]) - Progression Note    Patient Details  Name: Kim [name] MRN: [id number] Date of Birth: 24-Jan-1961  Transition of Care [facility]) CM/SW Contact  Erin Sons, [place] Phone Number: 12/13/2023, 4:16 PM  Clinical Narrative:     Met with pt and pt's daughter bedside and provided SNF bed offer. Daughter and pt agreeable to [facility]. CSW explained SNF auth process.   CSW confirmed bed with [facility]. Lacinda Axon will initiate SNF auth request today.   Expected Discharge Plan: Home w Home Health Services Barriers to Discharge: Continued Medical Work up  Expected Discharge Plan and Services     Post Acute Care Choice: Home Health Living arrangements for the past 2 months: Single Family Home                                       Social Determinants of Health (SDOH) Interventions SDOH Screenings   Food Insecurity: No Food Insecurity (12/06/2023)  Housing: Low Risk  (12/06/2023)  Transportation Needs: No Transportation Needs (12/06/2023)  Utilities: Not At Risk (12/06/2023)  Recent Concern: Utilities - At Risk (10/19/2023)  Depression (PHQ2-9): Low Risk  (10/11/2023)  Social Connections: Unknown (12/06/2023)  Tobacco Use: High Risk (12/06/2023)    Readmission Risk Interventions    12/11/2023   11:00 AM 05/09/2022   12:02 PM  Readmission Risk Prevention Plan  Post Dischage Appt  Complete  Medication Screening  Complete  Transportation Screening Complete Complete  PCP or Specialist Appt within 5-7 Days Complete   Home Care Screening Complete   Medication Review (RN CM) Complete

## 2023-12-13 NOTE — Progress Notes (Signed)
Occupational Therapy Treatment Patient Details Name: Kim [name] MRN: [id number] DOB: 1961-04-05 Today's Date: 12/13/2023   History of present illness Pt is a 63 y/o F presenting to ED on 2/4 wtih poor appetite, vomiting, admittedf or intractabel n/v. PMH includes h. pylori and gastritis, COPD on 3L O2, HTN, CKD   OT comments  Pt is making continued progress towards acute OT goals. Pt continues to be limited by deficits listed below. Pt required verbal encouragement to transfer OOB and engage in session on this date. Education provided on the importance of mobilizing daily. Pt completed bed mobility tasks with up to MIN A and expressed increased pain in R hand when grasping bed rail due to reports of neuropathy. Pt performed functional mobility tasks with up to MIN A. Verbal cues were required for RW management and self-placement inside RW. Pt completed toilet task using BSC with CGA while standing to perform peri care. CGA also provided for safety while standing at sink to perform grooming task due to impaired balance and decreased activity tolerance. OT to continue following pt acutely to address functional needs with the recommendation of OT therapy services <3hrs/day to maximize functional independence.      If plan is discharge home, recommend the following:  A little help with walking and/or transfers;A lot of help with bathing/dressing/bathroom;Assistance with cooking/housework;Direct supervision/assist for financial management;Direct supervision/assist for medications management;Help with stairs or ramp for entrance;Assist for transportation   Equipment Recommendations  Other (comment) (defer)    Recommendations for Other Services      Precautions / Restrictions Precautions Precautions: Fall Restrictions Weight Bearing Restrictions Per Provider Order: No       Mobility Bed Mobility Overal bed mobility: Needs Assistance Bed Mobility: Supine to Sit, Sit to Supine      Supine to sit: Min assist, Used rails Sit to supine: Supervision, Used rails   General bed mobility comments: Required verbal encouragement to transfer OOB. Increased time provided to initiate task. Required MIN A to facilitate trunk to upright position EOB.    Transfers Overall transfer level: Needs assistance Equipment used: Rolling walker (2 wheels) Transfers: Sit to/from Stand, Bed to chair/wheelchair/BSC Sit to Stand: Contact guard assist Stand pivot transfers: Min assist         General transfer comment: Verbal cues provided for B hand placement when performing sit to stand transfer. Pt required MIN A for RW management and verbal cues for self-placement inside RW when pivoting towards surfaces.     Balance Overall balance assessment: Needs assistance Sitting-balance support: Feet supported, Bilateral upper extremity supported Sitting balance-Leahy Scale: Good Sitting balance - Comments: sitting EOB   Standing balance support: During functional activity, Reliant on assistive device for balance Standing balance-Leahy Scale: Poor Standing balance comment: completed peri care and grooming task while standing supported                           ADL either performed or assessed with clinical judgement   ADL Overall ADL's : Needs assistance/impaired     Grooming: Wash/dry hands;Contact guard assist;Standing                   Toilet Transfer: Minimal assistance;Rolling walker (2 wheels)   Toileting- Clothing Manipulation and Hygiene: Contact guard assist;Sit to/from stand       Functional mobility during ADLs: Minimal assistance;Rolling walker (2 wheels);Cueing for sequencing;Cueing for safety General ADL Comments: Pt required verbal cues for B hand placement, sequencing  and self-placement inside RW when pivoting towards surfaces. Pt provided with CGA for balance and safety while standing to complete grooming and peri care due decreased activity tolerance  and impaired balance.    Extremity/Trunk Assessment Upper Extremity Assessment Upper Extremity Assessment: RUE deficits/detail;Generalized weakness RUE Deficits / Details: Pt reports neuropathy in R hand.   Lower Extremity Assessment Lower Extremity Assessment: Defer to PT evaluation        Vision   Vision Assessment?: No apparent visual deficits   Perception Perception Perception: Not tested   Praxis Praxis Praxis: Not tested   Communication Communication Communication: No apparent difficulties   Cognition Arousal: Alert Behavior During Therapy: Flat affect Cognition: No apparent impairments             OT - Cognition Comments: Pt requires verbal encouragement to transfer OOB. Pt educated on the importance of remaining active and mobilizing.                 Following commands: Intact        Cueing   Cueing Techniques: Verbal cues  Exercises      Shoulder Instructions       General Comments VSS on room air    Pertinent Vitals/ Pain       Pain Assessment Pain Assessment: Faces Faces Pain Scale: Hurts a little bit Pain Location: R hand due to neuropathy Pain Descriptors / Indicators: Numbness, Discomfort, Aching Pain Intervention(s): Monitored during session  Home Living                                          Prior Functioning/Environment              Frequency  Min 1X/week        Progress Toward Goals  OT Goals(current goals can now be found in the care plan section)  Progress towards OT goals: Progressing toward goals  Acute Rehab OT Goals Patient Stated Goal: to rest OT Goal Formulation: With patient Time For Goal Achievement: 12/23/23 Potential to Achieve Goals: Good ADL Goals Pt Will Perform Upper Body Dressing: with modified independence;sitting Pt Will Perform Lower Body Dressing: with min assist;with adaptive equipment;sitting/lateral leans;sit to/from stand;bed level Pt Will Transfer to Toilet:  with contact guard assist;ambulating;regular height toilet  Plan      Co-evaluation                 AM-PAC OT "6 Clicks" Daily Activity     Outcome Measure   Help from another person eating meals?: None Help from another person taking care of personal grooming?: A Little Help from another person toileting, which includes using toliet, bedpan, or urinal?: A Little Help from another person bathing (including washing, rinsing, drying)?: A Lot Help from another person to put on and taking off regular upper body clothing?: A Little Help from another person to put on and taking off regular lower body clothing?: A Lot 6 Click Score: 17    End of Session Equipment Utilized During Treatment: Gait belt;Rolling walker (2 wheels)  OT Visit Diagnosis: Unsteadiness on feet (R26.81);Other abnormalities of gait and mobility (R26.89);Muscle weakness (generalized) (M62.81)   Activity Tolerance Patient tolerated treatment well   Patient Left with call bell/phone within reach;in bed   Nurse Communication Mobility status        Time: 6962-9528 OT Time Calculation (min): 13 min  Charges: OT General Charges $  OT Visit: 1 Visit OT Treatments $Self Care/Home Management : 8-22 mins  Kevan Ny, Darliss Cheney 12/13/2023, 1:37 PM

## 2023-12-13 NOTE — Progress Notes (Signed)
Mobility Specialist: Progress Note   12/13/23 1133  Mobility  Activity Ambulated with assistance in room  Level of Assistance Contact guard assist, steadying assist  Assistive Device Front wheel walker  Distance Ambulated (ft) 30 ft  Range of Motion/Exercises Active;Right leg;Left leg  Activity Response Tolerated well  Mobility Referral Yes  Mobility visit 1 Mobility  Mobility Specialist Start Time (ACUTE ONLY) 0959  Mobility Specialist Stop Time (ACUTE ONLY) 1011  Mobility Specialist Time Calculation (min) (ACUTE ONLY) 12 min    Pt was agreeable to mobility session - received in bed. Only agreeable to ambulate to the door and back. CG throughout. C/o bil foot pain from neuropathy and R hand pain. Once bedside after ambulation, pt completed 20 standing marches. No unsteadiness or LOB observed. Left in bed with all needs met, call bell in reach. Bed alarm on.   Maurene Capes Mobility Specialist Please contact via SecureChat or Rehab office at [phone]

## 2023-12-13 NOTE — Progress Notes (Signed)
Patient ID: Kim [name], female   DOB: Feb 04, 1961, 63 y.o.   MRN: [id number]    Progress Note   Subjective   Day # 8 CC' nausea, vomiting, constipation, H. pylori gastropathy  No further diarrhea, not complaining of abdominal pain today but having bladder pain, and difficulty voiding.  She has not had any vomiting and not complaining of nausea today but still does not have any appetite and says she did not eat breakfast.  Labs today-WBC 7.0/hemoglobin 11.7/macro 34.6 stable Potassium 3.4/BUN 5/creatinine 1.19  He has been able to ambulate in the room with assistance   Objective   Vital signs in last 24 hours: Temp:  [97.3 F (36.3 C)-98.5 F (36.9 C)] 97.4 F (36.3 C) (02/12 0437) Pulse Rate:  [81-92] 89 (02/12 0437) Resp:  [17-18] 18 (02/12 1041) BP: (95-121)/(60-73) 121/66 (02/12 0437) SpO2:  [89 %-95 %] 95 % (02/12 0437) Last BM Date : 12/13/23 General: Older white female in NAD, affect flat, family member at bedside Heart:  Regular rate and rhythm; no murmurs Lungs: Respirations even and unlabored, lungs CTA bilaterally Abdomen:  Soft, nondistended, nontender except over bladder, bowel sounds are present. Normal bowel sounds. Extremities:  Without edema. Neurologic:  Alert and oriented,  grossly normal neurologically. Psych:  Cooperative. Normal mood and affect.  Intake/Output from previous day: 02/11 0701 - 02/12 0700 In: 860 [P.O.:860] Out: 960 [Urine:960] Intake/Output this shift: No intake/output data recorded.  Lab Results: Recent Labs    12/11/23 0648 12/12/23 0604 12/13/23 0638  WBC 6.4 6.6 7.0  HGB 12.2 11.5* 11.7*  HCT 35.2* 33.7* 34.6*  PLT 102* 110* 120*   BMET Recent Labs    12/11/23 0648 12/12/23 0604 12/13/23 0638  NA 134* 134* 134*  K 3.0* 3.5 3.4*  CL 97* 102 101  CO2 27 26 25   GLUCOSE 85 84 86  BUN <5* <5* <5*  CREATININE 1.04* 1.16* 1.19*  CALCIUM 8.3* 8.4* 8.4*   LFT No results for input(s): "PROT", "ALBUMIN", "AST",  "ALT", "ALKPHOS", "BILITOT", "BILIDIR", "IBILI" in the last 72 hours. PT/INR No results for input(s): "LABPROT", "INR" in the last 72 hours.  Studies/Results: DG Abd 2 Views Result Date: 12/11/2023 CLINICAL DATA:  Obstipation EXAM: ABDOMEN - 2 VIEW COMPARISON:  12/06/2023 FINDINGS: Supine and upright frontal views of the abdomen and pelvis are obtained. No bowel obstruction or ileus. No significant fecal retention. No masses or abnormal calcifications. No free gas in the greater peritoneal sac. Lung bases are clear. No acute bony abnormalities. IMPRESSION: 1. Unremarkable bowel gas pattern. Electronically Signed   By: Sharlet Salina M.D.   On: 12/11/2023 17:28       Assessment / Plan:    #11 63 year old white female admitted with persistent nausea and vomiting, recent diagnosis of H. pylori but unable to take the oral antibiotics.  she has improved over the past couple of days no longer having any vomiting and not complaining of nausea today  Scopolamine was stopped yesterday and now on low-dose p.o. Reglan 30 minutes AC for presumed gastroparesis  She is still on IV regimen for H. pylori that will need to be converted to oral equivalents to complete a 14-day course  Continue twice daily PPI  #2 obstipation-resolved continue MiraLAX 17 g in 8 ounces water daily #3 polyneuropathy #4 chronic kidney disease stage II #4.  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 pylori infection   Aortic atherosclerosis (HCC)   Constipation   Dehydration   Neuropathic pain   Epigastric pain     LOS: 6 days   Wanisha Shiroma EsterwoodPA-C  12/13/2023, 1:06 PM

## 2023-12-13 NOTE — Progress Notes (Signed)
IMTS Daily Note  Subjective:  - Patient feeling okay this morning. The topical capsaicin cream helped her leg pain a little - She did not eat dinner because she didn't feel hungry. Was able to take all PO meds without nausea or vomiting though. No abdominal pain. Had 1 small bowel movement last night.   Objective:  Vital signs in last 24 hours: Vitals:   12/12/23 1931 12/12/23 2143 12/13/23 0437 12/13/23 1041  BP: 114/67  121/66   Pulse: 92  89   Resp: 18  18 18   Temp: (!) 97.3 F (36.3 C) 98.5 F (36.9 C) (!) 97.4 F (36.3 C)   TempSrc: Oral Oral Oral   SpO2: 94%  95%   Weight:      Height:       Gen: well appearing, sitting up in bed, NAD CV: rrr, no m/r/g PULM: lungs clear bilaterally, no wheezes or crackles ABD: soft, mild ttp in suprapubic region, no r/g, +bowel sounds EXT: warm, symmetric, no edema  Assessment/Plan:  Principal Problem:   Intractable vomiting with nausea Active Problems:   Intractable nausea and vomiting   H. pylori infection   Aortic atherosclerosis (HCC)   Constipation   Dehydration   Neuropathic pain   Epigastric pain  62yo woman with HTN, CKD3A, and neuropathic hand & leg pain who presented with intractable nausea & vomiting at home in the setting of constipation and H pylori gastritis.   Dyspepsia 2/2 H pylori gastritis: Her nausea & vomiting have resolved, but her appetite is still poor. She was able to tolerate all PO medication, which is excellent.  Continue H pylori treatment with Amoxicillin 1g BID, Bismuth 30mL BID (she prefers this liquid to the chewable tablet), Clarithromycin 500mg  BID suspension, Flagyl 500mg  PO BID, and Pantoprazole 40mg  PO BID. This will be her regimen when she goes home, as she was unable to swallow the large Pylera pill  Stop scopolamine patch (RN to remove now) Reglan 5mg  TID (started this morning). Plan to do gastric emptying study in outpatient setting if symptoms do not improve after H pylori treatment, but  this would be low yield right now Continue Zofran 8mg  PO q8 hours  Urinary retention: Bladder scan had 400cc today. I think stopping the scop patch will help with this Follow up bladder scan in 4 hours, will I/O cath if >500cc Stop anticholinergic meds, including scop patch Neuropathic leg pain: This is patient's main concern this morning. She declined Cymbalta and had side effects with Gabapentin previously. Vicodin has helped in the past, but I'm hesitant to use this with her constipation and sleepiness Continue Tylenol prn Continue Capsaicin cream prn Constipation: Improved Continue Miralax daily with bisacodyl 5mg  PO prn constipation  Disposition: Plan to discharge to SNF. TOC team has sent referrals. I anticipate she will be medically ready on 2/13  Chronic stable issues: - Thrombocytopenia- chronic and stable - Stage 1 pressure injury- Wound care orders in - COPD- Continue home Incruse Ellipta - Hypertension: continue to hold home Amlodipine - HLD: Continue home Pravastatin  Core Measures: - Diet: normal - Code: Full - Dispo: SNF (see above)   Dispo: Anticipated discharge in approximately 1 day(s).   Mercie Eon, MD 12/13/2023, 10:57 AM

## 2023-12-14 DIAGNOSIS — F17210 Nicotine dependence, cigarettes, uncomplicated: Secondary | ICD-10-CM | POA: Diagnosis not present

## 2023-12-14 DIAGNOSIS — I10 Essential (primary) hypertension: Secondary | ICD-10-CM | POA: Diagnosis not present

## 2023-12-14 DIAGNOSIS — E871 Hypo-osmolality and hyponatremia: Secondary | ICD-10-CM | POA: Diagnosis not present

## 2023-12-14 DIAGNOSIS — Z7689 Persons encountering health services in other specified circumstances: Secondary | ICD-10-CM | POA: Diagnosis not present

## 2023-12-14 DIAGNOSIS — J449 Chronic obstructive pulmonary disease, unspecified: Secondary | ICD-10-CM | POA: Diagnosis not present

## 2023-12-14 DIAGNOSIS — E785 Hyperlipidemia, unspecified: Secondary | ICD-10-CM | POA: Diagnosis not present

## 2023-12-14 DIAGNOSIS — M792 Neuralgia and neuritis, unspecified: Secondary | ICD-10-CM | POA: Diagnosis not present

## 2023-12-14 DIAGNOSIS — B9681 Helicobacter pylori [H. pylori] as the cause of diseases classified elsewhere: Secondary | ICD-10-CM | POA: Diagnosis not present

## 2023-12-14 DIAGNOSIS — R112 Nausea with vomiting, unspecified: Secondary | ICD-10-CM | POA: Diagnosis not present

## 2023-12-14 DIAGNOSIS — G4700 Insomnia, unspecified: Secondary | ICD-10-CM | POA: Diagnosis not present

## 2023-12-14 DIAGNOSIS — I700 Atherosclerosis of aorta: Secondary | ICD-10-CM | POA: Diagnosis not present

## 2023-12-14 DIAGNOSIS — L89151 Pressure ulcer of sacral region, stage 1: Secondary | ICD-10-CM | POA: Diagnosis not present

## 2023-12-14 DIAGNOSIS — I129 Hypertensive chronic kidney disease with stage 1 through stage 4 chronic kidney disease, or unspecified chronic kidney disease: Secondary | ICD-10-CM | POA: Diagnosis not present

## 2023-12-14 DIAGNOSIS — A048 Other specified bacterial intestinal infections: Secondary | ICD-10-CM | POA: Diagnosis not present

## 2023-12-14 DIAGNOSIS — K30 Functional dyspepsia: Secondary | ICD-10-CM | POA: Diagnosis not present

## 2023-12-14 DIAGNOSIS — K5900 Constipation, unspecified: Secondary | ICD-10-CM | POA: Diagnosis not present

## 2023-12-14 DIAGNOSIS — E46 Unspecified protein-calorie malnutrition: Secondary | ICD-10-CM | POA: Diagnosis not present

## 2023-12-14 DIAGNOSIS — N1831 Chronic kidney disease, stage 3a: Secondary | ICD-10-CM | POA: Diagnosis not present

## 2023-12-14 DIAGNOSIS — J9610 Chronic respiratory failure, unspecified whether with hypoxia or hypercapnia: Secondary | ICD-10-CM | POA: Diagnosis not present

## 2023-12-14 DIAGNOSIS — K2950 Unspecified chronic gastritis without bleeding: Secondary | ICD-10-CM | POA: Diagnosis not present

## 2023-12-14 DIAGNOSIS — Z9981 Dependence on supplemental oxygen: Secondary | ICD-10-CM | POA: Diagnosis not present

## 2023-12-14 DIAGNOSIS — D696 Thrombocytopenia, unspecified: Secondary | ICD-10-CM | POA: Diagnosis not present

## 2023-12-14 LAB — BASIC METABOLIC PANEL
Anion gap: 9 (ref 5–15)
BUN: <5 mg/dL — ABNORMAL LOW (ref 8–23)
CO2: 29 mmol/L (ref 22–32)
Calcium: 9 mg/dL (ref 8.9–10.3)
Chloride: 96 mmol/L — ABNORMAL LOW (ref 98–111)
Creatinine, Ser: 1.13 mg/dL — ABNORMAL HIGH (ref 0.44–1.00)
GFR, Estimated: 55 mL/min — ABNORMAL LOW (ref >60)
Glucose, Bld: 89 mg/dL (ref 70–99)
Potassium: 3.3 mmol/L — ABNORMAL LOW (ref 3.5–5.1)
Sodium: 134 mmol/L — ABNORMAL LOW (ref 135–145)

## 2023-12-14 MED ORDER — POTASSIUM CHLORIDE 20 MEQ PO PACK
40.0000 meq | PACK | ORAL | Status: AC
  Administered 2023-12-14: 40 meq via ORAL
  Filled 2023-12-14: qty 2

## 2023-12-14 MED ORDER — BISMUTH SUBSALICYLATE 262 MG/15ML PO SUSP: 30.0000 mL | Freq: Two times a day (BID) | ORAL | Status: AC

## 2023-12-14 MED ORDER — ONDANSETRON 4 MG PO TBDP: 8.0000 mg | ORAL_TABLET | Freq: Three times a day (TID) | ORAL | Status: DC | PRN

## 2023-12-14 MED ORDER — PROSOURCE PLUS PO LIQD
30.0000 mL | Freq: Two times a day (BID) | ORAL | Status: DC
  Filled 2023-12-14: qty 30

## 2023-12-14 MED ORDER — PREGABALIN 25 MG PO CAPS: 50.0000 mg | ORAL_CAPSULE | Freq: Every day | ORAL | Status: DC

## 2023-12-14 MED ORDER — AMOXICILLIN 400 MG/5ML PO SUSR: 1000.0000 mg | Freq: Two times a day (BID) | ORAL | Status: AC

## 2023-12-14 MED ORDER — ADULT MULTIVITAMIN W/MINERALS CH
1.0000 | ORAL_TABLET | Freq: Every day | ORAL | Status: DC
  Administered 2023-12-14: 1 via ORAL
  Filled 2023-12-14: qty 1

## 2023-12-14 MED ORDER — PANTOPRAZOLE SODIUM 40 MG PO TBEC: 40.0000 mg | DELAYED_RELEASE_TABLET | Freq: Two times a day (BID) | ORAL | Status: AC

## 2023-12-14 MED ORDER — BOOST / RESOURCE BREEZE PO LIQD CUSTOM
1.0000 | Freq: Three times a day (TID) | ORAL | Status: DC
  Administered 2023-12-14 (×2): 1 via ORAL

## 2023-12-14 MED ORDER — CLARITHROMYCIN 250 MG/5ML PO SUSR: 500.0000 mg | Freq: Two times a day (BID) | ORAL | Status: AC

## 2023-12-14 MED ORDER — METOCLOPRAMIDE HCL 5 MG PO TABS: 5.0000 mg | ORAL_TABLET | Freq: Three times a day (TID) | ORAL | Status: DC

## 2023-12-14 MED ORDER — PREGABALIN 50 MG PO CAPS: 50.0000 mg | ORAL_CAPSULE | Freq: Every day | ORAL | Status: DC

## 2023-12-14 MED ORDER — METRONIDAZOLE 500 MG PO TABS: 500.0000 mg | ORAL_TABLET | Freq: Two times a day (BID) | ORAL | Status: AC

## 2023-12-14 MED ORDER — CAPSAICIN 0.025 % EX CREA: TOPICAL_CREAM | CUTANEOUS | Status: AC | PRN

## 2023-12-14 MED ORDER — ADULT MULTIVITAMIN W/MINERALS CH: 1.0000 | ORAL_TABLET | Freq: Every day | ORAL | Status: AC

## 2023-12-14 MED ORDER — PANTOPRAZOLE SODIUM 40 MG PO TBEC: 40.0000 mg | DELAYED_RELEASE_TABLET | Freq: Every day | ORAL | Status: AC

## 2023-12-14 NOTE — Progress Notes (Signed)
                  Subjective:   Summary: 63yo woman with HTN, CKD3A, and neuropathic hand & leg pain who presented with intractable nausea & vomiting at home in the setting of constipation and H pylori gastritis.   Had some mild nausea this morning, no further vomiting. She has been able to tolerate small amounts of liquids and soft foods. Had a bowel movement and has been voiding.   Objective:  Vital signs in last 24 hours: Vitals:   12/13/23 1041 12/13/23 1619 12/13/23 1957 12/14/23 0523  BP:  133/71 (!) 114/57 131/65  Pulse:  89 93 88  Resp: 18 20 20 20   Temp:   98.8 F (37.1 C) 98.5 F (36.9 C)  TempSrc:   Oral   SpO2:  95% (!) 86% 92%  Weight:      Height:       Supplemental O2: Room Air SpO2: 92 % O2 Flow Rate (L/min): 2 L/min FiO2 (%): 100 %  Physical Exam:  Constitutional: chronically ill appearing, fatigued Cardiovascular: RRR Pulmonary/Chest: lungs CTAB Abdominal: soft, nontender, nondistended. Normoactive bowel sounds Skin: shallow, well-healing decubitus ulcer Neuro: 5/5 strength, no focal deficit noted  Assessment/Plan:   Dyspepsia H Pylori gastritis Nausea and vomiting have resolved, patient continues to have poor appetite with minimal po intake. She has been able to tolerate po medications. Medically she is stable to be discharged to SNF, authorization is pending. Patient's daughter has many concerns about her mother's care and poor appetite - Continue Amoxicillin 1g BID, Bismuth 30mL BID, clarithromycin 500mg  suspension BID, Flagyl 500mg  po BID, pantoprazole 40mg  po BID, day 7/14 ending 2/21 - Continue Reglan 5mg  TID - Zofran 8mg  changed to prn   Urinary retention Scopolamine discontinued as patient had been retaining urine. Patient has been urinating normally since. Continue to monitor.   Neuropathic leg pain Continues to be patient's main concern. She is amenable to starting Lyrica - Lyrica 50mg  at bedtime - Tylenol prn, Capsaicin cream  prn  Constipation, improved Miralax daily with bisacodyl 5mg  prn  Diet: Regular VTE: Xarelto Code: Full  Dispo: Anticipated discharge to Skilled nursing facility pending medical stability.   Monna Fam, MD PGY-1 Internal Medicine Resident Pager Number [phone] Please contact the on call pager after 5 pm and on weekends at [phone].

## 2023-12-14 NOTE — Progress Notes (Signed)
Patients daughter at the patients bedside this morning. Patient stated that " I overheard one of the nurses talking and I heard that each nurse had 1 patient." Assigned RN stated that 'all the nurses had 6 patients each. Patients daughter stated that patient told the daughter that the patient almost fell. Patients daughter asked the Assigned RN if it was ok if patient gets out of bed on their own, Assigned RN notified the patients daughter that the bed alarm was on all night and Assigned RN was in there multiple times along with another RN to help patient with bathroom needs, Assigned RN only asked the patient to clean self after using the bathroom to promote independence. Patient did very well doing so. The patients daughter was notified that the patient is medically stable according to the MD note, patients daughter stated that's how it looks on paper, but patient had not eaten in 14 days, of course she is weak. Assigned RN attempted to continue to communicate with the patient, the patients daughter continued to be argumentative, making sure the bed alarm was set, in which this am it was noted to be off at that particular time, and daughter stated " See she just put it on." The patient continued to tell the daughter to stop without really saying any words. Assigned RN asked the patient ' wasn't I in here all the time to help you to the bathroom." The patient answered yes. The patients daughter walked out of the room, and returned stating " I will just speak with the Charge RN. Assigned RN went to notify that the patients daughter would like to speak with them.

## 2023-12-14 NOTE — Discharge Instructions (Addendum)
Kim [name], Whitener were hospitalized for nausea and poor oral intake. At this time your nausea is much better and you are able to tolerate some food. I feel comfortable discharging you to a skilled nursing facility with outpatient follow up. Thank you for allowing [name] to be part of your care.   We arranged for you to follow up at: [facility] on 12/20/23 at 9:15am  Please also schedule a follow up appointment with [facility] within 1 month  Please note these changes made to your medications:   *Please START taking:  Lyrica 50mg  at bedtime,  Pantoprazole 40mg  BID   These medications will end on 12/22/23 Bismuth suspension twice daily Amoxicillin 1g twice daily Clarithromycin 500mg  suspension twice daily Flagyl 500mg  twice daily              *Please STOP taking:  Amlodipine until restarted by your PCP  Please make sure to return to the hospital if you have shortness of breath, dizziness, or are unable to keep down any food.   Please call our clinic if you have any questions or concerns, we may be able to help and keep you from a long and expensive emergency room wait. Our clinic and after hours phone number is [phone], the best time to call is Monday through Friday 9 am to 4 pm but there is always someone available 24/7 if you have an emergency. If you need medication refills please notify your pharmacy one week in advance and they will send [name] a request.

## 2023-12-14 NOTE — Discharge Summary (Addendum)
Name: Kim [name] MRN: [id number] DOB: Feb 07, 1961 63 y.o. PCP: Modena Slater, DO  Date of Admission: 12/05/2023  5:44 PM Date of Discharge: 12/14/23 Attending Physician: Dr. Lafonda Mosses  Discharge Diagnosis: Principal Problem:   Intractable vomiting with nausea Active Problems:   Intractable nausea and vomiting   H. pylori infection   Aortic atherosclerosis (HCC)   Constipation   Dehydration   Neuropathic pain   Epigastric pain    Discharge Medications: Allergies as of 12/14/2023   No Known Allergies      Medication List     PAUSE taking these medications    amLODipine 5 MG tablet Wait to take this until your doctor or other care provider tells you to start again. Commonly known as: NORVASC Take 1 tablet (5 mg total) by mouth daily.       STOP taking these medications    Bismuth/Metronidaz/Tetracyclin 140-125-125 MG Caps Commonly known as: Pylera   omeprazole 20 MG capsule Commonly known as: PRILOSEC   potassium chloride SA 20 MEQ tablet Commonly known as: KLOR-CON M   sucralfate 1 GM/10ML suspension Commonly known as: CARAFATE       TAKE these medications    acetaminophen 500 MG tablet Commonly known as: TYLENOL Take 500 mg by mouth 3 (three) times daily as needed (pain).   albuterol 108 (90 Base) MCG/ACT inhaler Commonly known as: VENTOLIN HFA Inhale 2 puffs into the lungs every 4 (four) hours as needed for wheezing or shortness of breath.   amoxicillin 400 MG/5ML suspension Commonly known as: AMOXIL Take 12.5 mLs (1,000 mg total) by mouth every 12 (twelve) hours for 8 days.   bismuth subsalicylate 262 MG/15ML suspension Commonly known as: PEPTO BISMOL Take 30 mLs by mouth 2 (two) times daily for 8 days.   capsaicin 0.025 % cream Commonly known as: ZOSTRIX Apply topically as needed (apply feet for neuropathy).   clarithromycin 250 MG/5ML suspension Commonly known as: BIAXIN Take 10 mLs (500 mg total) by mouth every 12 (twelve) hours for  8 days.   Ensure Complete Shake Liqd Take 1 each by mouth in the morning and at bedtime.   metoCLOPramide 5 MG tablet Commonly known as: REGLAN Take 1 tablet (5 mg total) by mouth 3 (three) times daily before meals.   metroNIDAZOLE 500 MG tablet Commonly known as: FLAGYL Take 1 tablet (500 mg total) by mouth every 12 (twelve) hours for 8 days.   multivitamin with minerals Tabs tablet Take 1 tablet by mouth daily. Start taking on: December 15, 2023   ondansetron 4 MG disintegrating tablet Commonly known as: ZOFRAN-ODT Take 1 tablet (4 mg total) by mouth every 6 (six) hours.   OXYGEN Inhale 3 L into the lungs as needed.   pantoprazole 40 MG tablet Commonly known as: PROTONIX Take 1 tablet (40 mg total) by mouth 2 (two) times daily for 8 days.   pantoprazole 40 MG tablet Commonly known as: Protonix Take 1 tablet (40 mg total) by mouth daily. Start 1 tablet daily on 2/21 (after completed twice daily treatment for H.pylori) Start taking on: December 22, 2023   pravastatin 20 MG tablet Commonly known as: PRAVACHOL TAKE 1 TABLET BY MOUTH EVERYDAY AT BEDTIME   pregabalin 50 MG capsule Commonly known as: LYRICA Take 1 capsule (50 mg total) by mouth at bedtime.   Senexon-S 8.6-50 MG tablet Generic drug: senna-docusate Take 1 tablet by mouth at bedtime as needed for mild constipation.   triamcinolone 0.025 % ointment Commonly known as: KENALOG  Apply 1 Application topically 2 (two) times daily as needed (Itching).   umeclidinium bromide 62.5 MCG/ACT Aepb Commonly known as: INCRUSE ELLIPTA Inhale 1 puff into the lungs daily.               Discharge Care Instructions  (From admission, onward)           Start     Ordered   12/14/23 0000  Change dressing (specify)       Comments: Dressing change: every other day as needed with clean bandage   12/14/23 1402            Disposition and follow-up:   Kim [name] was discharged from [facility] in Stable condition.  At the hospital follow up visit please address:  1.  Follow-up:    H pylori gastritis: Patient should still be undergoing quadruple therapy for H pylori, ending 12/22/23. Check for dyspepsia/how much patient is able to eat. Instruct patient to schedule with [facility] if she has not already.    HTN: amlodipine held due to soft BP. Restart if indicated  2.  Labs / imaging needed at time of follow-up: None  3.  Pending labs/ test needing follow-up: None  4.  Medication Changes  Started: Lyrica 50mg  at bedtime, pantoprazole 40mg  po BID, Bismuth suspension BID until 2/21  Stopped:  Changed:  Abx - Amoxicillin 1g BID, Clarithromycin 500mg  suspension BID, Flagyl 500mg  po BID  End Date: 12/22/23  Follow-up Appointments:  Contact information for follow-up providers     [place], [facility] Follow up.   Why: [facility] will provide home health services.  They will call you in next 24-48 hours to set up home health services for PT. Contact information: [address] [place] [address] [phone]         [facility]. Schedule an appointment as soon as possible for a visit in 1 month(s).   Specialty: Gastroenterology Contact information: [address] [place] [address] [phone]             Contact information for after-discharge care     Destination     [facility] .   Service: Skilled Nursing Contact information: [address] [place] [address] [phone]                    [facility] 2/19  Hospital Course by problem list:  Dyspepsia H Pylori gastritis Patient admitted on 2/5 from [facility] with persistent nausea and vomiting. She was diagnosed with H pylori gastritis bu EGD in January but was not able to complete treatment due to size of pills. GI was consulted on admission. Dyspepsia thought to be multi factorial  in setting of H. Pylori gastritis, chronic constipation and possible gastroparesis. Constipation improved with bowel regimen, she does take Vicodin occasionally at home. She initially was started on IV H pylori treatment, but was able to be transitioned to PO H pylori prior to discharge. She was started on reglan prior to discharge for empiric treatment of gastroparesis.  Urinary retention She had in and out cath 2 times while admitted. This resolved with discontinuing scopalomine patch.  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 She was taking her partners vicodin at home for pain. She was started on cymbalta but did not note improvement and family concerned about her being on medication for depression. Previously she had side effects  with double vision when taking gabapentin. She was open to trying lyrica and this was started during admission.  Constipation Improved with bowel regimen.   Discharge Subjective: Had some mild nausea this morning, no further vomiting. She has been able to tolerate small amounts of liquids and soft foods. Had a bowel movement and has been voiding.   Discharge Exam:   BP (!) 119/57 (BP Location: Left Arm)   Pulse 86   Temp 98 F (36.7 C)   Resp 18   Ht 5\' 5"  (1.651 m)   Wt 74.4 kg   SpO2 91%   BMI 27.29 kg/m  Constitutional: chronically ill appearing, fatigued Cardiovascular: RRR Pulmonary/Chest: lungs CTAB Abdominal: soft, nontender, nondistended. Normoactive bowel sounds Skin: shallow, well-healing decubitus ulcer Neuro: 5/5 strength, no focal deficit noted  Pertinent Labs, Studies, and Procedures:     Latest Ref Rng & Units 12/13/2023    6:38 AM 12/12/2023    6:04 AM 12/11/2023    6:48 AM  CBC  WBC 4.0 - 10.5 K/uL 7.0  6.6  6.4   Hemoglobin 12.0 - 15.0 g/dL 16.1  09.6  04.5   Hematocrit 36.0 - 46.0 % 34.6  33.7  35.2   Platelets 150 - 400 K/uL 120  110  102        Latest Ref Rng & Units 12/14/2023    6:42 AM  12/13/2023    6:38 AM 12/12/2023    6:04 AM  CMP  Glucose 70 - 99 mg/dL 89  86  84   BUN 8 - 23 mg/dL <5  <5  <5   Creatinine 0.44 - 1.00 mg/dL 4.09  8.11  9.14   Sodium 135 - 145 mmol/L 134  134  134   Potassium 3.5 - 5.1 mmol/L 3.3  3.4  3.5   Chloride 98 - 111 mmol/L 96  101  102   CO2 22 - 32 mmol/L 29  25  26    Calcium 8.9 - 10.3 mg/dL 9.0  8.4  8.4     CT ABDOMEN PELVIS W CONTRAST Result Date: 12/06/2023 CLINICAL DATA:  Abdominal pain. Pain medicine given. Bowel obstruction suspected. EXAM: CT ABDOMEN AND PELVIS WITH CONTRAST TECHNIQUE: Multidetector CT imaging of the abdomen and pelvis was performed using the standard protocol following bolus administration of intravenous contrast. RADIATION DOSE REDUCTION: This exam was performed according to the departmental dose-optimization program which includes automated exposure control, adjustment of the mA and/or kV according to patient size and/or use of iterative reconstruction technique. CONTRAST:  75mL OMNIPAQUE IOHEXOL 350 MG/ML SOLN COMPARISON:  KUB 12/05/2023, CT chest, abdomen, and pelvis of 10/18/2023, CT abdomen and pelvis 05/02/2022 FINDINGS: Lower chest: A small cystic bleb measuring 4 mm is again seen within the anterior superior middle lobe (axial series 4, image 5). The lung bases are clear. Dense coronary artery calcifications are again seen. Hepatobiliary: Smooth liver contours. The gallbladder is mildly mildly distended, measuring up to approximately 4.1 x 4.6 x 7.9 cm, however no gallbladder wall thickening or surrounding inflammatory fat stranding is seen. No intrahepatic or extrahepatic biliary ductal dilatation. Unchanged low-density adjacent to the falciform ligament, consistent with benign focal fatty deposition and similar to comparison studies. Pancreas: Mild pancreatic parenchymal atrophy is similar to prior. No pancreatic ductal dilatation. No surrounding inflammatory fat stranding. Spleen: Normal in size without focal  abnormality. Adrenals/Urinary Tract: Normal adrenals. The kidneys enhance uniformly and are symmetric in size without hydronephrosis. Unchanged subcentimeter low-density posterior right middle pole low-attenuation lesion, too  small to further characterize but statistically most likely a tiny cyst. No follow-up imaging is recommended. Mild viral chronic renal cortical thinning is unchanged. The urinary bladder is only mildly distended but no urinary bladder wall thickening is seen. Stomach/Bowel: High-grade sigmoid diverticulosis is again seen. Mild chronic wall thickening of sigmoid is unchanged from multiple prior CTs and chronic, possibly secondary to prior diverticulitis. No inflammatory fat stranding is seen around the sigmoid colon to indicate acute diverticulitis. Oral contrast is seen as distal as the rectum. The terminal ileum is unremarkable. The appendix is not confidently identified, however no inflammatory changes are seen around the cecum to indicate secondary signs of acute appendicitis. The appearance is similar to prior. A tubular structure extending inferiorly from the cecum (coronal series 6, images 75 and 76) may represent a blood vessel versus a normal appendix. No dilated loops of bowel are seen to indicate bowel obstruction. Vascular/Lymphatic: High-grade atherosclerotic calcifications. No significant change in bilobed distal abdominal aortic aneurysm inferior to the renal arteries and just proximal to the aortic bifurcation measuring up to 3.2 x 3.1 cm (AP by transverse) when measured on orthogonal views and coronal and sagittal images. There is again moderate mixed calcified and noncalcified plaque. No mesenteric, retroperitoneal, or pelvic pathologically enlarged lymph nodes by CT criteria. Reproductive: The uterus is present.  No adnexal mass is seen. Other: No ventral abdominal wall while hernia. No free air or free fluid is seen within the abdomen or pelvis. Musculoskeletal: Partial  sacralization of L5. Grade 1 anterolisthesis of L4 on L5, unchanged. Moderate disc space narrowing of the visualized lower thoracic spine. IMPRESSION: 1. No acute abnormality is seen within the abdomen or pelvis. No evidence of bowel obstruction. 2. High-grade sigmoid diverticulosis without evidence of acute diverticulitis. 3. Extensive atherosclerosis. 4. Unchanged bilobed distal abdominal aortic aneurysm measuring up to 3.2 cm. Recommend follow-up ultrasound every 3 years. (Ref.: J Vasc Surg. 2018; 67:2-77 and J Am Coll Radiol 2013;10(10):789-794.) Electronically Signed   By: Neita Garnet M.D.   On: 12/06/2023 12:18   DG Chest 2 View Result Date: 12/05/2023 CLINICAL DATA:  chest pain EXAM: CHEST - 2 VIEW COMPARISON:  Chest x-ray 10/06/2022, CT chest 10/18/2023 FINDINGS: The heart and mediastinal contours are within normal limits. Atherosclerotic plaque. Hyperinflation of the lungs. No focal consolidation. No pulmonary edema. No pleural effusion. No pneumothorax. No acute osseous abnormality. IMPRESSION: 1. No active cardiopulmonary disease. 2. Aortic Atherosclerosis (ICD10-I70.0) and Emphysema (ICD10-J43.9). Electronically Signed   By: Tish Frederickson M.D.   On: 12/05/2023 19:04   DG Abdomen 1 View Result Date: 12/05/2023 CLINICAL DATA:  Constipation, vomiting EXAM: ABDOMEN - 1 VIEW COMPARISON:  None Available. FINDINGS: Moderate to large stool burden throughout the colon. There is a non obstructive bowel gas pattern. No supine evidence of free air. No organomegaly or suspicious calcification. No acute bony abnormality. IMPRESSION: Moderate to large stool burden.  No acute findings. Electronically Signed   By: Charlett Nose M.D.   On: 12/05/2023 12:11    Signed: Monna Fam, MD PGY-1 12/14/2023, 2:13 PM   Pager: [phone]

## 2023-12-14 NOTE — Plan of Care (Signed)

## 2023-12-14 NOTE — Progress Notes (Signed)
Brief Nutrition Note  Contacted by [facility] that pt with poor intake and requested that Magic Cup be added to pt's tray to encourage PO intake. RPH already ordered Boost Breeze TID. Will request magic cups be added to tray.   Will not follow at this time but if pt's poor po is a persistent issue, please submit a nutrition consult for a formal RD evaluation.   Greig Castilla, RD, LDN Registered Dietitian II Please reach out via secure chat Weekend on-call pager # available in [facility]

## 2023-12-14 NOTE — TOC Progression Note (Signed)
Transition of Care [facility]) - Progression Note    Patient Details  Name: Kim [name] MRN: [id number] Date of Birth: April 14, 1961  [facility]) CM/SW Contact  Tommie Bohlken A [place], LCSW Phone Number: 12/14/2023, 12:23 PM  Clinical Narrative:     CSW was informed that pt's insurance authorization was approved for SNF to [place]. Provider and nursing notified.   Bed available today at facility. [id number], report [phone]. EDD today.    TOC will continue to follow.   Expected Discharge Plan: Home w Home Health Services Barriers to Discharge: Continued Medical Work up  Expected Discharge Plan and Services     Post Acute Care Choice: Home Health Living arrangements for the past 2 months: Single Family Home                                       Social Determinants of Health (SDOH) Interventions SDOH Screenings   Food Insecurity: No Food Insecurity (12/06/2023)  Housing: Low Risk  (12/06/2023)  Transportation Needs: No Transportation Needs (12/06/2023)  Utilities: Not At Risk (12/06/2023)  Recent Concern: Utilities - At Risk (10/19/2023)  Depression (PHQ2-9): Low Risk  (10/11/2023)  Social Connections: Unknown (12/06/2023)  Tobacco Use: High Risk (12/06/2023)    Readmission Risk Interventions    12/11/2023   11:00 AM 05/09/2022   12:02 PM  Readmission Risk Prevention Plan  Post Dischage Appt  Complete  Medication Screening  Complete  Transportation Screening Complete Complete  PCP or Specialist Appt within 5-7 Days Complete   Home Care Screening Complete   Medication Review (RN CM) Complete

## 2023-12-15 DIAGNOSIS — B9681 Helicobacter pylori [H. pylori] as the cause of diseases classified elsewhere: Secondary | ICD-10-CM | POA: Diagnosis not present

## 2023-12-15 DIAGNOSIS — K2970 Gastritis, unspecified, without bleeding: Secondary | ICD-10-CM | POA: Diagnosis not present

## 2023-12-15 DIAGNOSIS — R634 Abnormal weight loss: Secondary | ICD-10-CM | POA: Diagnosis not present

## 2023-12-15 DIAGNOSIS — G629 Polyneuropathy, unspecified: Secondary | ICD-10-CM | POA: Diagnosis not present

## 2023-12-20 ENCOUNTER — Encounter: Payer: Medicaid Other | Admitting: Student

## 2023-12-22 DIAGNOSIS — G629 Polyneuropathy, unspecified: Secondary | ICD-10-CM | POA: Diagnosis not present

## 2023-12-22 DIAGNOSIS — B9681 Helicobacter pylori [H. pylori] as the cause of diseases classified elsewhere: Secondary | ICD-10-CM | POA: Diagnosis not present

## 2023-12-22 DIAGNOSIS — I479 Paroxysmal tachycardia, unspecified: Secondary | ICD-10-CM | POA: Diagnosis not present

## 2023-12-22 DIAGNOSIS — K2970 Gastritis, unspecified, without bleeding: Secondary | ICD-10-CM | POA: Diagnosis not present

## 2023-12-27 ENCOUNTER — Ambulatory Visit (INDEPENDENT_AMBULATORY_CARE_PROVIDER_SITE_OTHER): Payer: Medicaid Other | Admitting: Student

## 2023-12-27 VITALS — BP 127/69 | HR 114 | Temp 98.3°F | Ht 65.0 in | Wt 171.6 lb

## 2023-12-27 DIAGNOSIS — A048 Other specified bacterial intestinal infections: Secondary | ICD-10-CM

## 2023-12-27 DIAGNOSIS — I10 Essential (primary) hypertension: Secondary | ICD-10-CM | POA: Diagnosis not present

## 2023-12-27 DIAGNOSIS — B9681 Helicobacter pylori [H. pylori] as the cause of diseases classified elsewhere: Secondary | ICD-10-CM | POA: Diagnosis not present

## 2023-12-27 DIAGNOSIS — J449 Chronic obstructive pulmonary disease, unspecified: Secondary | ICD-10-CM | POA: Diagnosis not present

## 2023-12-27 DIAGNOSIS — R5381 Other malaise: Secondary | ICD-10-CM

## 2023-12-27 NOTE — Assessment & Plan Note (Signed)
 Chronically stable and uses albuterol as needed.  No signs of active exacerbation at this time -Continue albuterol as needed

## 2023-12-27 NOTE — Progress Notes (Unsigned)
 CC: Hospital follow-up  HPI:  [name] is a 63 y.o. female living with a history stated below and presents today for hospital follow-up and also discuss her other chronic conditions. Please see problem based assessment and plan for additional details.  Past Medical History:  Diagnosis Date   COPD (chronic obstructive pulmonary disease) (HCC)    H. pylori infection    High cholesterol    Hypertension    Smoker     Current Outpatient Medications on File Prior to Visit  Medication Sig Dispense Refill   acetaminophen (TYLENOL) 500 MG tablet Take 500 mg by mouth 3 (three) times daily as needed (pain).     albuterol (VENTOLIN HFA) 108 (90 Base) MCG/ACT inhaler Inhale 2 puffs into the lungs every 4 (four) hours as needed for wheezing or shortness of breath. 18 g 2   [Paused] amLODipine (NORVASC) 5 MG tablet Take 1 tablet (5 mg total) by mouth daily. (Patient not taking: Reported on 12/01/2023) 30 tablet 1   capsaicin (ZOSTRIX) 0.025 % cream Apply topically as needed (apply feet for neuropathy).     metoCLOPramide (REGLAN) 5 MG tablet Take 1 tablet (5 mg total) by mouth 3 (three) times daily before meals.     Multiple Vitamin (MULTIVITAMIN WITH MINERALS) TABS tablet Take 1 tablet by mouth daily.     Nutritional Supplements (ENSURE COMPLETE SHAKE) LIQD Take 1 each by mouth in the morning and at bedtime. 237 mL 2   ondansetron (ZOFRAN-ODT) 4 MG disintegrating tablet Take 1 tablet (4 mg total) by mouth every 6 (six) hours. 120 tablet 1   OXYGEN Inhale 3 L into the lungs as needed.     pantoprazole (PROTONIX) 40 MG tablet Take 1 tablet (40 mg total) by mouth 2 (two) times daily for 8 days.     pantoprazole (PROTONIX) 40 MG tablet Take 1 tablet (40 mg total) by mouth daily. Start 1 tablet daily on 2/21 (after completed twice daily treatment for H.pylori)     pravastatin (PRAVACHOL) 20 MG tablet TAKE 1 TABLET BY MOUTH EVERYDAY AT BEDTIME (Patient taking differently: Take 20 mg by mouth at  bedtime.) 90 tablet 0   pregabalin (LYRICA) 50 MG capsule Take 1 capsule (50 mg total) by mouth at bedtime.     senna-docusate (SENOKOT-S) 8.6-50 MG tablet Take 1 tablet by mouth at bedtime as needed for mild constipation. 2 tablet 0   triamcinolone (KENALOG) 0.025 % ointment Apply 1 Application topically 2 (two) times daily as needed (Itching). 30 g 0   umeclidinium bromide (INCRUSE ELLIPTA) 62.5 MCG/ACT AEPB Inhale 1 puff into the lungs daily. 30 each 11   No current facility-administered medications on file prior to visit.    Family History  Problem Relation Age of Onset   CAD Mother    Colon cancer Neg Hx    Esophageal cancer Neg Hx     Social History   Socioeconomic History   Marital status: Media planner    Spouse name: Not on file   Number of children: 3   Years of education: Not on file   Highest education level: Not on file  Occupational History   Not on file  Tobacco Use   Smoking status: Every Day    Current packs/day: 1.00    Average packs/day: 1 pack/day for 40.1 years (40.1 ttl pk-yrs)    Types: Cigarettes    Start date: 12/05/1983   Smokeless tobacco: Never  Vaping Use   Vaping status: Never Used  Substance and Sexual Activity   Alcohol use: Not Currently   Drug use: Not Currently   Sexual activity: Yes  Other Topics Concern   Not on file  Social History Narrative   Not on file   Social Drivers of Health   Financial Resource Strain: Not on file  Food Insecurity: No Food Insecurity (12/06/2023)   Hunger Vital Sign    Worried About Running Out of Food in the Last Year: Never true    Ran Out of Food in the Last Year: Never true  Transportation Needs: No Transportation Needs (12/06/2023)   PRAPARE - Administrator, Civil Service (Medical): No    Lack of Transportation (Non-Medical): No  Physical Activity: Not on file  Stress: Not on file  Social Connections: Unknown (12/06/2023)   Social Connection and Isolation Panel [NHANES]    Frequency  of Communication with Friends and Family: Twice a week    Frequency of Social Gatherings with Friends and Family: Not on file    Attends Religious Services: 1 to 4 times per year    Active Member of [employer] or Organizations: No    Attends Banker Meetings: Never    Marital Status: Married  Catering manager Violence: Not At Risk (12/06/2023)   Humiliation, Afraid, Rape, and Kick questionnaire    Fear of Current or Ex-Partner: No    Emotionally Abused: No    Physically Abused: No    Sexually Abused: No    Review of Systems: ROS negative except for what is noted on the assessment and plan.  Vitals:   12/27/23 1514  BP: 127/69  Pulse: (!) 114  Temp: 98.3 F (36.8 C)  TempSrc: Oral  SpO2: 95%  Weight: 171 lb 9.6 oz (77.8 kg)  Height: 5\' 5"  (1.651 m)    Physical Exam: Constitutional: Ill-appearing woman, sitting in chair, in no acute distress Cardiovascular: regular rate and rhythm, no m/r/g Pulmonary/Chest: normal work of breathing on room air, lungs clear to auscultation bilaterally Abdominal: soft, non-tender, non-distended MSK: normal bulk and tone.  No lower extremity edema Neurological: alert & oriented x 3, no focal deficit Skin: warm and dry Psych: Soft-spoken but speech is goal oriented.  Assessment & Plan:   Essential hypertension BP Readings from Last 3 Encounters:  12/27/23 127/69  12/14/23 (!) 119/57  12/04/23 118/70   Previously on amlodipine but was discontinued in December 2024.  Patient has not been on any antihypertensives and it seems to me his blood pressure has been at goal and antihypertensive may not be indicated at this time.  Will continue annual follow-up on her blood pressures.  Consider restarting amlodipine if patient's blood pressure persistently exceeds 130/80.  ASCVD reviewed.  Patient is prescribed pravastatin but seems like she is not taking this medication in over a year.  Need for moderate to high intensity statin adequately  discussed with the patient.  Please address this issue at the next visit.  The 10-year ASCVD risk score (Arnett DK, et al., 2019) is: 10.1%   Values used to calculate the score:     Age: 2 years     Sex: Female     Is Non-Hispanic African American: No     Diabetic: No     Tobacco smoker: Yes     Systolic Blood Pressure: 127 mmHg     Is BP treated: No     HDL Cholesterol: 34 mg/dL     Total Cholesterol: 176 mg/dL   H. pylori  infection Patient was recently admitted due to nausea and vomiting and found to have H. pylori infection.  She was initiated on quadruple therapy during that time.  Patient endorses complete resolution of her symptoms, her last dose of her quadruple therapy is tomorrow.  Plan is to have the patient come back in 4 to 5 weeks to confirm complete eradication of the H. pylori bacteria but it seems like she has an appointment with [facility] during that same time.  Will defer urine breath test to gastroenterology during her visit on 01/24/2024  COPD (chronic obstructive pulmonary disease) (HCC) Chronically stable and uses albuterol as needed.  No signs of active exacerbation at this time -Continue albuterol as needed     Patient discussed with Dr. Cleda Daub    Kathleen Lime, M.D [facility] Phone: [phone] Date 12/27/2023 Time 5:22 PM

## 2023-12-27 NOTE — Assessment & Plan Note (Signed)
 Patient was recently admitted due to nausea and vomiting and found to have H. pylori infection.  She was initiated on quadruple therapy during that time.  Patient endorses complete resolution of her symptoms, her last dose of her quadruple therapy is tomorrow.  Plan is to have the patient come back in 4 to 5 weeks to confirm complete eradication of the H. pylori bacteria but it seems like she has an appointment with  GI during that same time.  Will defer urine breath test to gastroenterology during her visit on 01/24/2024

## 2023-12-27 NOTE — Assessment & Plan Note (Addendum)
 BP Readings from Last 3 Encounters:  12/27/23 127/69  12/14/23 (!) 119/57  12/04/23 118/70   Previously on amlodipine but was discontinued in December 2024.  Patient has not been on any antihypertensives and it seems to me his blood pressure has been at goal and antihypertensive may not be indicated at this time.  Will continue annual follow-up on her blood pressures.  Consider restarting amlodipine if patient's blood pressure persistently exceeds 130/80.  ASCVD reviewed.  Patient is prescribed pravastatin but seems like she is not taking this medication in over a year.  Need for moderate to high intensity statin adequately discussed with the patient.  Please address this issue at the next visit.  The 10-year ASCVD risk score (Arnett DK, et al., 2019) is: 10.1%   Values used to calculate the score:     Age: 63 years     Sex: Female     Is Non-Hispanic African American: No     Diabetic: No     Tobacco smoker: Yes     Systolic Blood Pressure: 127 mmHg     Is BP treated: No     HDL Cholesterol: 34 mg/dL     Total Cholesterol: 176 mg/dL

## 2023-12-27 NOTE — Patient Instructions (Addendum)
 Thank you, Ms.Wynelle Fanny Milleson for allowing [name] to provide your care today. Today we discussed your hospital follow-up.  I am glad to hear that your abdominal pain and nausea has resolved since the date of the hospital.  You mention to me you still have some of the medications at home and tomorrow will be your last dose.  I want you to follow-up with [name] in the next 4 to 5 weeks to retest to see if that bacteria has been successfully eradicated.  In the meantime I have resent the referral for home health physical therapy to help you get your strength back.    I have ordered the following labs for you:  Lab Orders  No laboratory test(s) ordered today     Tests ordered today:    Referrals ordered today:   Referral Orders         Ambulatory referral to Home Health       I have ordered the following medication/changed the following medications:   Stop the following medications: There are no discontinued medications.   Start the following medications: No orders of the defined types were placed in this encounter.    Follow up: 6 weeks    Remember: To attend your GI appointment on 01/24/2024 11:00 AM   Should you have any questions or concerns please call the internal medicine clinic at [phone].    Kathleen Lime, M.D [facility]

## 2023-12-30 DIAGNOSIS — Z419 Encounter for procedure for purposes other than remedying health state, unspecified: Secondary | ICD-10-CM | POA: Diagnosis not present

## 2023-12-31 NOTE — Progress Notes (Signed)
 Internal Medicine Clinic Attending  Case discussed with the resident at the time of the visit.  We reviewed the resident's history and exam and pertinent patient test results.  I agree with the assessment, diagnosis, and plan of care documented in the resident's note.

## 2024-01-02 ENCOUNTER — Telehealth: Payer: Self-pay | Admitting: *Deleted

## 2024-01-02 NOTE — Telephone Encounter (Signed)
 Mammogram appointment March 27.2025 @ 2:30 pm arrive 2:15 pm / patient is aware of the 75.00 no show fee for not calling to reschedule or canceling appointment. She is to contact the breast center at [phone]. Appointment also mailed to the patient.

## 2024-01-04 DIAGNOSIS — I129 Hypertensive chronic kidney disease with stage 1 through stage 4 chronic kidney disease, or unspecified chronic kidney disease: Secondary | ICD-10-CM | POA: Diagnosis not present

## 2024-01-04 DIAGNOSIS — G4733 Obstructive sleep apnea (adult) (pediatric): Secondary | ICD-10-CM | POA: Diagnosis not present

## 2024-01-04 DIAGNOSIS — R339 Retention of urine, unspecified: Secondary | ICD-10-CM | POA: Diagnosis not present

## 2024-01-04 DIAGNOSIS — D696 Thrombocytopenia, unspecified: Secondary | ICD-10-CM | POA: Diagnosis not present

## 2024-01-04 DIAGNOSIS — G629 Polyneuropathy, unspecified: Secondary | ICD-10-CM | POA: Diagnosis not present

## 2024-01-04 DIAGNOSIS — K5900 Constipation, unspecified: Secondary | ICD-10-CM | POA: Diagnosis not present

## 2024-01-04 DIAGNOSIS — I7143 Infrarenal abdominal aortic aneurysm, without rupture: Secondary | ICD-10-CM | POA: Diagnosis not present

## 2024-01-04 DIAGNOSIS — I700 Atherosclerosis of aorta: Secondary | ICD-10-CM | POA: Diagnosis not present

## 2024-01-04 DIAGNOSIS — J9610 Chronic respiratory failure, unspecified whether with hypoxia or hypercapnia: Secondary | ICD-10-CM | POA: Diagnosis not present

## 2024-01-04 DIAGNOSIS — J449 Chronic obstructive pulmonary disease, unspecified: Secondary | ICD-10-CM | POA: Diagnosis not present

## 2024-01-04 DIAGNOSIS — N183 Chronic kidney disease, stage 3 unspecified: Secondary | ICD-10-CM | POA: Diagnosis not present

## 2024-01-04 DIAGNOSIS — E46 Unspecified protein-calorie malnutrition: Secondary | ICD-10-CM | POA: Diagnosis not present

## 2024-01-05 ENCOUNTER — Telehealth: Payer: Self-pay | Admitting: *Deleted

## 2024-01-05 DIAGNOSIS — D696 Thrombocytopenia, unspecified: Secondary | ICD-10-CM | POA: Diagnosis not present

## 2024-01-05 DIAGNOSIS — R69 Illness, unspecified: Secondary | ICD-10-CM | POA: Diagnosis not present

## 2024-01-05 DIAGNOSIS — J9610 Chronic respiratory failure, unspecified whether with hypoxia or hypercapnia: Secondary | ICD-10-CM | POA: Diagnosis not present

## 2024-01-05 DIAGNOSIS — I700 Atherosclerosis of aorta: Secondary | ICD-10-CM | POA: Diagnosis not present

## 2024-01-05 DIAGNOSIS — I7143 Infrarenal abdominal aortic aneurysm, without rupture: Secondary | ICD-10-CM | POA: Diagnosis not present

## 2024-01-05 DIAGNOSIS — G4733 Obstructive sleep apnea (adult) (pediatric): Secondary | ICD-10-CM | POA: Diagnosis not present

## 2024-01-05 DIAGNOSIS — K5900 Constipation, unspecified: Secondary | ICD-10-CM | POA: Diagnosis not present

## 2024-01-05 DIAGNOSIS — R339 Retention of urine, unspecified: Secondary | ICD-10-CM | POA: Diagnosis not present

## 2024-01-05 DIAGNOSIS — N183 Chronic kidney disease, stage 3 unspecified: Secondary | ICD-10-CM | POA: Diagnosis not present

## 2024-01-05 DIAGNOSIS — I129 Hypertensive chronic kidney disease with stage 1 through stage 4 chronic kidney disease, or unspecified chronic kidney disease: Secondary | ICD-10-CM | POA: Diagnosis not present

## 2024-01-05 DIAGNOSIS — E46 Unspecified protein-calorie malnutrition: Secondary | ICD-10-CM | POA: Diagnosis not present

## 2024-01-05 DIAGNOSIS — J449 Chronic obstructive pulmonary disease, unspecified: Secondary | ICD-10-CM | POA: Diagnosis not present

## 2024-01-05 DIAGNOSIS — G629 Polyneuropathy, unspecified: Secondary | ICD-10-CM | POA: Diagnosis not present

## 2024-01-05 NOTE — Telephone Encounter (Signed)
 Copied from CRM [id number]. Topic: Clinical - Home Health Verbal Orders >> Jan 05, 2024 12:11 PM Tiffany H wrote: Caller/Agency: Angie with [facility] Callback Number: [phone] Service Requested: Physical Therapy Frequency: 1 week 1, 2 week 8.  Any new concerns about the patient? Status: Oxygen at rest without power 86%. With oxygen, 98%. Please follow up for closer directives.

## 2024-01-05 NOTE — Telephone Encounter (Signed)
 Return call to Angie PT with [facility] requesting verbal orders "PT once a week x 1 week; twice a week x 8 weeks".  Stated pt is very weak- walked about 75 feet. Smoker. W/o O2 sat 82-88%; on 3L O2 sat up to 98%. VO given - sending to Red team for approval or denial.

## 2024-01-10 ENCOUNTER — Ambulatory Visit: Payer: Self-pay | Admitting: Student

## 2024-01-10 DIAGNOSIS — N183 Chronic kidney disease, stage 3 unspecified: Secondary | ICD-10-CM | POA: Diagnosis not present

## 2024-01-10 DIAGNOSIS — E46 Unspecified protein-calorie malnutrition: Secondary | ICD-10-CM | POA: Diagnosis not present

## 2024-01-10 DIAGNOSIS — J9610 Chronic respiratory failure, unspecified whether with hypoxia or hypercapnia: Secondary | ICD-10-CM | POA: Diagnosis not present

## 2024-01-10 DIAGNOSIS — J449 Chronic obstructive pulmonary disease, unspecified: Secondary | ICD-10-CM | POA: Diagnosis not present

## 2024-01-10 DIAGNOSIS — I700 Atherosclerosis of aorta: Secondary | ICD-10-CM | POA: Diagnosis not present

## 2024-01-10 DIAGNOSIS — G4733 Obstructive sleep apnea (adult) (pediatric): Secondary | ICD-10-CM | POA: Diagnosis not present

## 2024-01-10 DIAGNOSIS — K5900 Constipation, unspecified: Secondary | ICD-10-CM | POA: Diagnosis not present

## 2024-01-10 DIAGNOSIS — I7143 Infrarenal abdominal aortic aneurysm, without rupture: Secondary | ICD-10-CM | POA: Diagnosis not present

## 2024-01-10 DIAGNOSIS — R339 Retention of urine, unspecified: Secondary | ICD-10-CM | POA: Diagnosis not present

## 2024-01-10 DIAGNOSIS — I129 Hypertensive chronic kidney disease with stage 1 through stage 4 chronic kidney disease, or unspecified chronic kidney disease: Secondary | ICD-10-CM | POA: Diagnosis not present

## 2024-01-10 DIAGNOSIS — G629 Polyneuropathy, unspecified: Secondary | ICD-10-CM | POA: Diagnosis not present

## 2024-01-10 DIAGNOSIS — D696 Thrombocytopenia, unspecified: Secondary | ICD-10-CM | POA: Diagnosis not present

## 2024-01-10 NOTE — Telephone Encounter (Signed)
 Angie HH nurse from [facility] called to report patient status Chief Complaint: patient non compliant wearing oxygen at home O2 sats 81 % on room air .  No BM in 3-4 days. Symptoms: O 2 sat at 81% room air. Patient will apply oxygen if nurse requests and O2 sat goes up to 98% 2 L/MinNC. No BM x 3-4 days and declined stool softeners as recommended by [facility] nurse.  Frequency: today  Pertinent Negatives: Patient denies na Disposition: [] ED /[] Urgent Care (no appt availability in office) / [] Appointment(In office/virtual)/ []  [place] [facility]/ [] Home Care/ [] Refused Recommended Disposition /[] [place] Mobile [facility]/ [x]  Follow-up with PCP Additional Notes:   Please advise. Information only [facility] nurse not with patient at this time.       Copied from CRM [id number]. Topic: Clinical - Medical Advice >> Jan 10, 2024  4:04 PM Alona Bene A wrote: Reason for CRM: Angie from [facility] called in to advise that patient is not utilizing oxygen and upon visit the oxygen levels was at 81%. Patient has not had bowel movement in 3-4 days patient was not 100% sure of how long. Please contact Angie at [phone] for further assistance. Reason for Disposition  [1] Caller requesting NON-URGENT health information AND [2] PCP's office is the best resource  Answer Assessment - Initial Assessment Questions 1. REASON FOR CALL or QUESTION: "What is your reason for calling today?" or "How can I best help you?" or "What question do you have that I can help answer?"     Angie nurse from [facility] calling to report patient noncompliance with using oxygen and O 2 sats low on room air and no BM in 3-4 days  Protocols used: Information Only Call - No Triage-A-AH

## 2024-01-11 ENCOUNTER — Telehealth: Payer: Self-pay | Admitting: *Deleted

## 2024-01-11 DIAGNOSIS — J449 Chronic obstructive pulmonary disease, unspecified: Secondary | ICD-10-CM | POA: Diagnosis not present

## 2024-01-11 NOTE — Telephone Encounter (Signed)
 RTC to  Kim [name], OT  [facility].  Requesting OT 1 time a week for 1 week and then 2 times a week for 3 weeks.  Work on [employer] with ADL's and Instrumental Activities of Daily living .  Verbal approval given will forward to PCP for Denial or approval.

## 2024-01-11 NOTE — Telephone Encounter (Signed)
 Copied from CRM [id number]. Topic: Clinical - Home Health Verbal Orders >> Jan 11, 2024  3:27 PM Jonne Ply wrote: Caller/Agency: [facility]  Callback Number: [phone] Service Requested: Occupational Therapy Frequency: 1 week 1 and 2 wk 3 Any new concerns about the patient? No

## 2024-01-12 ENCOUNTER — Ambulatory Visit: Payer: Self-pay | Admitting: Student

## 2024-01-12 NOTE — Telephone Encounter (Signed)
 Chief Complaint: constipation, abdominal pain Symptoms: nausea, loss of appetite, intermittent abdominal pain, constipation, "felt feverish" Frequency: x 1 week Pertinent Negatives: Patient denies straining for bowel movements, rectal pain, blood in stools, vomiting Disposition: [x] ED /[] Urgent Care (no appt availability in office) / [] Appointment(In office/virtual)/ []  [place] Virtual [facility]/ [] Home Care/ [] Refused Recommended Disposition /[]  Mobile Bus/ []  Follow-up with PCP Additional Notes: Patient calling in for triage, she states she thinks the bacteria is back in her stomach. Patient states she ran out of her nausea medication, and does not have any of the [place] for constipation. She states her husband and daughter are supposed to be taking care of her, but she states he is sick with terminal cancer and her daughter is busy working. She states she has home health but that is not enough. She states she needs to be placed in a facility with therapy every day, meals cooked and brought to her, and her medications given to her properly. Patient states she has only drank a half glass of [facility] today. Advised to be evaluated today for symptoms, patient states she can not come in today. She states she would like to go to the hospital and be admitted to a facility afterward. Patient agreeable to call 911.  Copied from CRM [id number]. Topic: Clinical - Red Word Triage >> Jan 12, 2024  1:05 PM Irine Seal wrote: [facility] that prompted transfer to Nurse Triage: patient is experiencing loss of appetite, no bowel movements, nauseated but denies vomiting, systems present for over a week, she did not want to wait for a call back so transferring to triage Reason for Disposition  [1] Intermittent mild abdominal pain AND [2] fever  Answer Assessment - Initial Assessment Questions 1. STOOL PATTERN OR FREQUENCY: "How often do you have a bowel movement (BM)?"  (Normal range: 3 times a day to every 3  days)  "When was your last BM?"       Her regular is once every 3 days . Last bowel movement about a week ago.  2. STRAINING: "Do you have to strain to have a BM?"      Denies.  3. RECTAL PAIN: "Does your rectum hurt when the stool comes out?" If Yes, ask: "Do you have hemorrhoids? How bad is the pain?"  (Scale 1-10; or mild, moderate, severe)     Denies.  4. STOOL COMPOSITION: "Are the stools hard?"      Denies. She states it is formed, not hard or loose.  5. BLOOD ON STOOLS: "Has there been any blood on the toilet tissue or on the surface of the BM?" If Yes, ask: "When was the last time?"     Denies.  6. CHRONIC CONSTIPATION: "Is this a new problem for you?"  If No, ask: "How long have you had this problem?" (days, weeks, months)      Patient has history of constipation for years.  7. CHANGES IN DIET OR HYDRATION: "Have there been any recent changes in your diet?" "How much fluids are you drinking on a daily basis?"  "How much have you had to drink today?"     She states she is not drinking as much fluids. She states today she has only half a glass of [facility].  8. MEDICINES: "Have you been taking any new medicines?" "Are you taking any narcotic pain medicines?" (e.g., Dilaudid, morphine, Percocet, Vicodin)     Denies any new medications or narcotics.  9. LAXATIVES: "Have you been using  any stool softeners, laxatives, or enemas?"  If Yes, ask "What, how often, and when was the last time?"     Denies any use.  10. ACTIVITY:  "How much walking do you do every day?"  "Has your activity level decreased in the past week?"        She states she can hardly walk, she states she does not do much walking. She states she uses a wheelchair when she leaves her home.  11. CAUSE: "What do you think is causing the constipation?"        She states she is afraid the bacteria is coming back in her stomach.  12. OTHER SYMPTOMS: "Do you have any other symptoms?" (e.g., abdomen pain, bloating,  fever, vomiting)       5/10 lower abdominal pain "I'm not sure", when asked how long it has been going on for. Loss of appetite, nausea.  13. MEDICAL HISTORY: "Do you have a history of hemorrhoids, rectal fissures, or rectal surgery or rectal abscess?"         Denies.  14. PREGNANCY: "Is there any chance you are pregnant?" "When was your last menstrual period?"       N/A.  Protocols used: Constipation-A-AH

## 2024-01-13 DIAGNOSIS — G4733 Obstructive sleep apnea (adult) (pediatric): Secondary | ICD-10-CM | POA: Diagnosis not present

## 2024-01-13 DIAGNOSIS — J9610 Chronic respiratory failure, unspecified whether with hypoxia or hypercapnia: Secondary | ICD-10-CM | POA: Diagnosis not present

## 2024-01-13 DIAGNOSIS — E46 Unspecified protein-calorie malnutrition: Secondary | ICD-10-CM | POA: Diagnosis not present

## 2024-01-13 DIAGNOSIS — K5900 Constipation, unspecified: Secondary | ICD-10-CM | POA: Diagnosis not present

## 2024-01-13 DIAGNOSIS — J449 Chronic obstructive pulmonary disease, unspecified: Secondary | ICD-10-CM | POA: Diagnosis not present

## 2024-01-13 DIAGNOSIS — I700 Atherosclerosis of aorta: Secondary | ICD-10-CM | POA: Diagnosis not present

## 2024-01-13 DIAGNOSIS — G629 Polyneuropathy, unspecified: Secondary | ICD-10-CM | POA: Diagnosis not present

## 2024-01-13 DIAGNOSIS — R339 Retention of urine, unspecified: Secondary | ICD-10-CM | POA: Diagnosis not present

## 2024-01-13 DIAGNOSIS — I7143 Infrarenal abdominal aortic aneurysm, without rupture: Secondary | ICD-10-CM | POA: Diagnosis not present

## 2024-01-13 DIAGNOSIS — I129 Hypertensive chronic kidney disease with stage 1 through stage 4 chronic kidney disease, or unspecified chronic kidney disease: Secondary | ICD-10-CM | POA: Diagnosis not present

## 2024-01-13 DIAGNOSIS — N183 Chronic kidney disease, stage 3 unspecified: Secondary | ICD-10-CM | POA: Diagnosis not present

## 2024-01-13 DIAGNOSIS — D696 Thrombocytopenia, unspecified: Secondary | ICD-10-CM | POA: Diagnosis not present

## 2024-01-15 NOTE — Telephone Encounter (Signed)
 RTC to patient when asked what she needed she stated refills.  Did not know what she needed refilled.  Patient was asked to call her pharmacy and tell them that she needed refills on her medications and that she was unsure as to which on's are needed.  Patient to call her Pharmacy to see what refills need to be sent to here doctor's office.  When asked if she was still planning to go to a faculty.  Patient stated not at this time.  Patient to call her pharmacy,

## 2024-01-16 DIAGNOSIS — N183 Chronic kidney disease, stage 3 unspecified: Secondary | ICD-10-CM | POA: Diagnosis not present

## 2024-01-16 DIAGNOSIS — I129 Hypertensive chronic kidney disease with stage 1 through stage 4 chronic kidney disease, or unspecified chronic kidney disease: Secondary | ICD-10-CM | POA: Diagnosis not present

## 2024-01-16 DIAGNOSIS — G629 Polyneuropathy, unspecified: Secondary | ICD-10-CM | POA: Diagnosis not present

## 2024-01-16 DIAGNOSIS — E46 Unspecified protein-calorie malnutrition: Secondary | ICD-10-CM | POA: Diagnosis not present

## 2024-01-16 DIAGNOSIS — I700 Atherosclerosis of aorta: Secondary | ICD-10-CM | POA: Diagnosis not present

## 2024-01-16 DIAGNOSIS — G4733 Obstructive sleep apnea (adult) (pediatric): Secondary | ICD-10-CM | POA: Diagnosis not present

## 2024-01-16 DIAGNOSIS — D696 Thrombocytopenia, unspecified: Secondary | ICD-10-CM | POA: Diagnosis not present

## 2024-01-16 DIAGNOSIS — R339 Retention of urine, unspecified: Secondary | ICD-10-CM | POA: Diagnosis not present

## 2024-01-16 DIAGNOSIS — J449 Chronic obstructive pulmonary disease, unspecified: Secondary | ICD-10-CM | POA: Diagnosis not present

## 2024-01-16 DIAGNOSIS — I7143 Infrarenal abdominal aortic aneurysm, without rupture: Secondary | ICD-10-CM | POA: Diagnosis not present

## 2024-01-16 DIAGNOSIS — J9610 Chronic respiratory failure, unspecified whether with hypoxia or hypercapnia: Secondary | ICD-10-CM | POA: Diagnosis not present

## 2024-01-16 DIAGNOSIS — K5900 Constipation, unspecified: Secondary | ICD-10-CM | POA: Diagnosis not present

## 2024-01-17 ENCOUNTER — Ambulatory Visit: Payer: Self-pay

## 2024-01-17 NOTE — Telephone Encounter (Signed)
 Copied from CRM [id number]. Topic: Clinical - Red Word Triage >> Jan 17, 2024 12:54 PM Corin V wrote: [facility] that prompted transfer to Nurse Triage: Patient is having significant stomach pain. She has not eaten in a few days. Pain is rating 6/10. She is feeling nauseas and sick.   Chief Complaint: Abdominal pain  Symptoms: Diffuse abdominal pain, nausea  Frequency: Constant  Disposition: [x] ED /[] Urgent Care (no appt availability in office) / [] Appointment(In office/virtual)/ []  [place] [facility]/ [] Home Care/ [] Refused Recommended Disposition /[] [place] Mobile [facility]/ []  Follow-up with PCP Additional Notes: Patient reports she has been experiencing constant abdominal pain for the last week. She states that with her pain she is also experiencing nausea and has not eaten in a few days. She states that she has had similar symptoms in the past with a bacterial infection. Patient advised to go to the ED for evaluation of her symptoms. Patient verbalized understanding and agreement with this plan.    Reason for Disposition  Patient sounds very sick or weak to the triager  Answer Assessment - Initial Assessment Questions 1. LOCATION: "Where does it hurt?"      "Across my belly" 2. RADIATION: "Does the pain shoot anywhere else?" (e.g., chest, back)     No 3. ONSET: "When did the pain begin?" (e.g., minutes, hours or days ago)      Approximately 1 week ago  5. PATTERN "Does the pain come and go, or is it constant?"    - If it comes and goes: "How long does it last?" "Do you have pain now?"     (Note: Comes and goes means the pain is intermittent. It goes away completely between bouts.)    - If constant: "Is it getting better, staying the same, or getting worse?"      (Note: Constant means the pain never goes away completely; most serious pain is constant and gets worse.)      Constant  6. SEVERITY: "How bad is the pain?"  (e.g., Scale 1-10; mild, moderate, or severe)    - MILD (1-3):  Doesn't interfere with normal activities, abdomen soft and not tender to touch.     - MODERATE (4-7): Interferes with normal activities or awakens from sleep, abdomen tender to touch.     - SEVERE (8-10): Excruciating pain, doubled over, unable to do any normal activities.       6/10 7. RECURRENT SYMPTOM: "Have you ever had this type of stomach pain before?" If Yes, ask: "When was the last time?" and "What happened that time?"      Yes, was told she had a bacterial infection  8. CAUSE: "What do you think is causing the stomach pain?"     Unsure  9. RELIEVING/AGGRAVATING FACTORS: "What makes it better or worse?" (e.g., antacids, bending or twisting motion, bowel movement)     No 10. OTHER SYMPTOMS: "Do you have any other symptoms?" (e.g., back pain, diarrhea, fever, urination pain, vomiting)       Nausea  Protocols used: Abdominal Pain - Female-A-AH

## 2024-01-18 DIAGNOSIS — D696 Thrombocytopenia, unspecified: Secondary | ICD-10-CM | POA: Diagnosis not present

## 2024-01-18 DIAGNOSIS — I129 Hypertensive chronic kidney disease with stage 1 through stage 4 chronic kidney disease, or unspecified chronic kidney disease: Secondary | ICD-10-CM | POA: Diagnosis not present

## 2024-01-18 DIAGNOSIS — J449 Chronic obstructive pulmonary disease, unspecified: Secondary | ICD-10-CM | POA: Diagnosis not present

## 2024-01-18 DIAGNOSIS — K5900 Constipation, unspecified: Secondary | ICD-10-CM | POA: Diagnosis not present

## 2024-01-18 DIAGNOSIS — J9610 Chronic respiratory failure, unspecified whether with hypoxia or hypercapnia: Secondary | ICD-10-CM | POA: Diagnosis not present

## 2024-01-18 DIAGNOSIS — N183 Chronic kidney disease, stage 3 unspecified: Secondary | ICD-10-CM | POA: Diagnosis not present

## 2024-01-18 DIAGNOSIS — E46 Unspecified protein-calorie malnutrition: Secondary | ICD-10-CM | POA: Diagnosis not present

## 2024-01-18 DIAGNOSIS — G629 Polyneuropathy, unspecified: Secondary | ICD-10-CM | POA: Diagnosis not present

## 2024-01-18 DIAGNOSIS — G4733 Obstructive sleep apnea (adult) (pediatric): Secondary | ICD-10-CM | POA: Diagnosis not present

## 2024-01-18 DIAGNOSIS — I7143 Infrarenal abdominal aortic aneurysm, without rupture: Secondary | ICD-10-CM | POA: Diagnosis not present

## 2024-01-18 DIAGNOSIS — I700 Atherosclerosis of aorta: Secondary | ICD-10-CM | POA: Diagnosis not present

## 2024-01-18 DIAGNOSIS — R339 Retention of urine, unspecified: Secondary | ICD-10-CM | POA: Diagnosis not present

## 2024-01-19 DIAGNOSIS — R339 Retention of urine, unspecified: Secondary | ICD-10-CM | POA: Diagnosis not present

## 2024-01-19 DIAGNOSIS — K5900 Constipation, unspecified: Secondary | ICD-10-CM | POA: Diagnosis not present

## 2024-01-19 DIAGNOSIS — E46 Unspecified protein-calorie malnutrition: Secondary | ICD-10-CM | POA: Diagnosis not present

## 2024-01-19 DIAGNOSIS — J9610 Chronic respiratory failure, unspecified whether with hypoxia or hypercapnia: Secondary | ICD-10-CM | POA: Diagnosis not present

## 2024-01-19 DIAGNOSIS — G4733 Obstructive sleep apnea (adult) (pediatric): Secondary | ICD-10-CM | POA: Diagnosis not present

## 2024-01-19 DIAGNOSIS — N183 Chronic kidney disease, stage 3 unspecified: Secondary | ICD-10-CM | POA: Diagnosis not present

## 2024-01-19 DIAGNOSIS — I700 Atherosclerosis of aorta: Secondary | ICD-10-CM | POA: Diagnosis not present

## 2024-01-19 DIAGNOSIS — G629 Polyneuropathy, unspecified: Secondary | ICD-10-CM | POA: Diagnosis not present

## 2024-01-19 DIAGNOSIS — J449 Chronic obstructive pulmonary disease, unspecified: Secondary | ICD-10-CM | POA: Diagnosis not present

## 2024-01-19 DIAGNOSIS — I129 Hypertensive chronic kidney disease with stage 1 through stage 4 chronic kidney disease, or unspecified chronic kidney disease: Secondary | ICD-10-CM | POA: Diagnosis not present

## 2024-01-19 DIAGNOSIS — I7143 Infrarenal abdominal aortic aneurysm, without rupture: Secondary | ICD-10-CM | POA: Diagnosis not present

## 2024-01-19 DIAGNOSIS — D696 Thrombocytopenia, unspecified: Secondary | ICD-10-CM | POA: Diagnosis not present

## 2024-01-22 DIAGNOSIS — I129 Hypertensive chronic kidney disease with stage 1 through stage 4 chronic kidney disease, or unspecified chronic kidney disease: Secondary | ICD-10-CM | POA: Diagnosis not present

## 2024-01-23 DIAGNOSIS — I129 Hypertensive chronic kidney disease with stage 1 through stage 4 chronic kidney disease, or unspecified chronic kidney disease: Secondary | ICD-10-CM | POA: Diagnosis not present

## 2024-01-24 ENCOUNTER — Ambulatory Visit: Payer: Medicaid Other | Admitting: Gastroenterology

## 2024-01-24 ENCOUNTER — Ambulatory Visit: Payer: Self-pay

## 2024-01-24 NOTE — Telephone Encounter (Signed)
  Chief Complaint: Nausea/wants help at home  Symptoms: Nausea, not eating well Frequency: a week ago Pertinent Negatives: Patient denies fever Disposition: [] ED /[] Urgent Care (no appt availability in office) / [] Appointment(In office/virtual)/ []  [place] [facility]/ [] Home Care/ [] Refused Recommended Disposition /[] [place] Mobile Bus/ [x]  Follow-up with PCP Additional Notes: patient called stating "I need help and Im so nauseous." Patient endorses that she isn't eating well and doesn't have much of an appetite. Patient's husband had been helping take care of her post discharge from rehab. Patient husband ended up going to the ED last night and is being admitted to the hospital. Patient was very upset on the phone when asked to elaborate how she needed help. "I just need help-I can hardly get out of the bed or I need to go to rehab where they will take care of me." Patient states her daughter and grandchildren are living with her but that "she doesn't pay me any attention." Patient is asking for nausea medication to be sent to the pharmacy and to see if home health can come into the house or if she can go back to rehab. Patient is asking for a phone call back from staff.    Copied from CRM [id number]. Topic: Clinical - Medical Advice >> Jan 24, 2024  2:53 PM Adrianna P wrote: Reason for CRM: Patient needs assistance she states she is not eating good, cant get out of bed. Also she just got out of rehab. Reason for Disposition  Nausea lasts > 1 week  Answer Assessment - Initial Assessment Questions 1. NAUSEA SEVERITY: "How bad is the nausea?" (e.g., mild, moderate, severe; dehydration, weight loss)   - MILD: loss of appetite without change in eating habits   - MODERATE: decreased oral intake without significant weight loss, dehydration, or malnutrition   - SEVERE: inadequate caloric or fluid intake, significant weight loss, symptoms of dehydration     Moderate 2. ONSET: "When did the nausea  begin?"     Started a week ago 3. VOMITING: "Any vomiting?" If Yes, ask: "How many times today?"     No 4. RECURRENT SYMPTOM: "Have you had nausea before?" If Yes, ask: "When was the last time?" "What happened that time?"     Yes 5. CAUSE: "What do you think is causing the nausea?"     Unsure  Protocols used: Nausea-A-AH

## 2024-01-24 NOTE — Telephone Encounter (Signed)
RTC to patient -message left that the Clinics had called. 

## 2024-01-24 NOTE — Progress Notes (Deleted)
 Chief Complaint:*** Primary GI Doctor:***  HPI: Kim [name] is a 63 y.o. female with past medical history of obesity, hypertension, hyperlipidemia, infrarenal AAA, chronic tobacco use, chronic respiratory failure on home oxygen 3L [place], COPD, OSA, CKD stage II and polyneuropathy who is seen in inpatient consultation at the request of Dr. Johny Sax for persistent symptoms of nausea and vomiting in the setting of a diagnosis of H. pylori gastritis.   Per Dr. Doy Hutching hospital note  "In speaking with Kim [name] and Kim family as well as reviewing Kim prior medical records, she presented with nausea, vomiting and loss of appetite and 09/2023.  She was seen by Kim PCP and prescribed ondansetron and Protonix 40 mg orally daily.  Kim symptoms unfortunately progressed and she was offered inpatient hospitalization in December 2024 for which she initially declined.  She was started on mirtazapine 15 mg p.o. nightly for appetite stimulation.   She was ultimately admitted to [facility] 10/18/2023 - 10/20/2023 for evaluation of symptoms.  Laboratory studies (thyroid profile, cortisol ) and CT imaging were unremarkable.  EGD showed acute ulcerative and erosive gastritis for which biopsies were positive for H. pylori.  She was prescribed Pylera for treatment of H. Pylori.  Per Kim [name], she was unable to tolerate treatment and has not really taken any of the tablets.  Kim [name] also notes that the tablets are too big for Kim mother to swallow.   She was last seen in GI clinic on 11/30/2023 at which time she reported no improvement in nausea and vomiting and was unable to keep solid food down.  Labs were noteworthy for hypokalemia.  She was given omeprazole 20 mg orally daily and ondansetron 4 mg every 6 hours.   She was readmitted to [facility] on 12/06/2023 with ongoing symptoms.  Reports chronic nausea and intermittent vomiting when she tries to eat.  [name] states she has not had solid food for  several days and is worried about dehydration.  No hematemesis or coffee-ground emesis.  She does not think that Zofran has been helpful.Records suggest that a gastric emptying scan was previously considered but not performed.  No documented history of gastroparesis.   Notes that she has chronic constipation and has not had a bowel movement for 1 week.  This has been an ongoing issue.  Occasionally uses stool softeners at home.  Has used suppositories but no enemas.  She has never had a colonoscopy. "  Plan:  Reglan 5mg  30 minutes AC for presumed gastroparesis  IV regimen for H. pylori that will need to be converted to oral equivalents to complete a 14-day course. undergoing quadruple therapy for H pylori, ending 12/22/23  Continue pantoprazole 40mg  po BID  obstipation-resolved continue MiraLAX 17 g in 8 ounces water daily    Interval History  Patient admits/denies GERD Patient admits/denies dysphagia Patient admits/denies nausea, vomiting, or weight loss  Patient admits/denies altered bowel habits Patient admits/denies abdominal pain Patient admits/denies rectal bleeding   Denies/Admits alcohol Denies/Admits smoking Denies/Admits NSAID use. Denies/Admits they are on blood thinners.  Patients last colonoscopy Patients last EGD  Patient's family history includes  Wt Readings from Last 3 Encounters:  12/27/23 171 lb 9.6 oz (77.8 kg)  12/05/23 164 lb (74.4 kg)  12/04/23 164 lb 0.4 oz (74.4 kg)      Past Medical History:  Diagnosis Date   COPD (chronic obstructive pulmonary disease) (HCC)    H. pylori infection    High cholesterol  Hypertension    Smoker     Past Surgical History:  Procedure Laterality Date   BIOPSY  10/20/2023   Procedure: BIOPSY;  Surgeon: Beverley Fiedler, MD;  Location: [facility] ENDOSCOPY;  Service: Gastroenterology;;   ESOPHAGOGASTRODUODENOSCOPY (EGD) WITH PROPOFOL N/A 10/20/2023   Procedure: ESOPHAGOGASTRODUODENOSCOPY (EGD) WITH PROPOFOL;  Surgeon:  Beverley Fiedler, MD;  Location: [facility] ENDOSCOPY;  Service: Gastroenterology;  Laterality: N/A;   SALPINGECTOMY N/A     Current Outpatient Medications  Medication Sig Dispense Refill   acetaminophen (TYLENOL) 500 MG tablet Take 500 mg by mouth 3 (three) times daily as needed (pain).     albuterol (VENTOLIN HFA) 108 (90 Base) MCG/ACT inhaler Inhale 2 puffs into the lungs every 4 (four) hours as needed for wheezing or shortness of breath. 18 g 2   [Paused] amLODipine (NORVASC) 5 MG tablet Take 1 tablet (5 mg total) by mouth daily. (Patient not taking: Reported on 12/01/2023) 30 tablet 1   capsaicin (ZOSTRIX) 0.025 % cream Apply topically as needed (apply feet for neuropathy).     metoCLOPramide (REGLAN) 5 MG tablet Take 1 tablet (5 mg total) by mouth 3 (three) times daily before meals.     Multiple Vitamin (MULTIVITAMIN WITH MINERALS) TABS tablet Take 1 tablet by mouth daily.     Nutritional Supplements (ENSURE COMPLETE SHAKE) LIQD Take 1 each by mouth in the morning and at bedtime. 237 mL 2   ondansetron (ZOFRAN-ODT) 4 MG disintegrating tablet Take 1 tablet (4 mg total) by mouth every 6 (six) hours. 120 tablet 1   OXYGEN Inhale 3 L into the lungs as needed.     pantoprazole (PROTONIX) 40 MG tablet Take 1 tablet (40 mg total) by mouth 2 (two) times daily for 8 days.     pantoprazole (PROTONIX) 40 MG tablet Take 1 tablet (40 mg total) by mouth daily. Start 1 tablet daily on 2/21 (after completed twice daily treatment for H.pylori)     pravastatin (PRAVACHOL) 20 MG tablet TAKE 1 TABLET BY MOUTH EVERYDAY AT BEDTIME (Patient taking differently: Take 20 mg by mouth at bedtime.) 90 tablet 0   pregabalin (LYRICA) 50 MG capsule Take 1 capsule (50 mg total) by mouth at bedtime.     senna-docusate (SENOKOT-S) 8.6-50 MG tablet Take 1 tablet by mouth at bedtime as needed for mild constipation. 2 tablet 0   triamcinolone (KENALOG) 0.025 % ointment Apply 1 Application topically 2 (two) times daily as needed (Itching).  30 g 0   umeclidinium bromide (INCRUSE ELLIPTA) 62.5 MCG/ACT AEPB Inhale 1 puff into the lungs daily. 30 each 11   No current facility-administered medications for this visit.    Allergies as of 01/24/2024   (No Known Allergies)    Family History  Problem Relation Age of Onset   CAD Mother    Colon cancer Neg Hx    Esophageal cancer Neg Hx     Review of Systems:    Constitutional: No weight loss, fever, chills, weakness or fatigue HEENT: Eyes: No change in vision               Ears, Nose, Throat:  No change in hearing or congestion Skin: No rash or itching Cardiovascular: No chest pain, chest pressure or palpitations   Respiratory: No SOB or cough Gastrointestinal: See HPI and otherwise negative Genitourinary: No dysuria or change in urinary frequency Neurological: No headache, dizziness or syncope Musculoskeletal: No new muscle or joint pain Hematologic: No bleeding or bruising Psychiatric: No history of depression  or anxiety    Physical Exam:  Vital signs: There were no vitals taken for this visit.  Constitutional:   Pleasant *** female appears to be in NAD, Well developed, Well nourished, alert and cooperative Head:  Normocephalic and atraumatic. Eyes:   PEERL, EOMI. No icterus. Conjunctiva pink. Ears:  Normal auditory acuity. Neck:  Supple Throat: Oral cavity and pharynx without inflammation, swelling or lesion.  Respiratory: Respirations even and unlabored. Lungs clear to auscultation bilaterally.   No wheezes, crackles, or rhonchi.  Cardiovascular: Normal S1, S2. Regular rate and rhythm. No peripheral edema, cyanosis or pallor.  Gastrointestinal:  Soft, nondistended, nontender. No rebound or guarding. Normal bowel sounds. No appreciable masses or hepatomegaly. Rectal:  Not performed.  Anoscopy: Msk:  Symmetrical without gross deformities. Without edema, no deformity or joint abnormality.  Neurologic:  Alert and  oriented x4;  grossly normal neurologically.   Skin:   Dry and intact without significant lesions or rashes. Psychiatric: Oriented to person, place and time. Demonstrates good judgement and reason without abnormal affect or behaviors.  RELEVANT LABS AND IMAGING: CBC    Latest Ref Rng & Units 12/13/2023    6:38 AM 12/12/2023    6:04 AM 12/11/2023    6:48 AM  CBC  WBC 4.0 - 10.5 K/uL 7.0  6.6  6.4   Hemoglobin 12.0 - 15.0 g/dL 52.8  41.3  24.4   Hematocrit 36.0 - 46.0 % 34.6  33.7  35.2   Platelets 150 - 400 K/uL 120  110  102      CMP     Latest Ref Rng & Units 12/14/2023    6:42 AM 12/13/2023    6:38 AM 12/12/2023    6:04 AM  CMP  Glucose 70 - 99 mg/dL 89  86  84   BUN 8 - 23 mg/dL <5  <5  <5   Creatinine 0.44 - 1.00 mg/dL 0.10  2.72  5.36   Sodium 135 - 145 mmol/L 134  134  134   Potassium 3.5 - 5.1 mmol/L 3.3  3.4  3.5   Chloride 98 - 111 mmol/L 96  101  102   CO2 22 - 32 mmol/L 29  25  26    Calcium 8.9 - 10.3 mg/dL 9.0  8.4  8.4      Lab Results  Component Value Date   TSH 4.19 11/30/2023   Imaging: 12/11/2023 two-view x-ray IMPRESSION: 1. Unremarkable bowel gas pattern. 12/06/2023 CT abdomen pelvis with contrast IMPRESSION: 1. No acute abnormality is seen within the abdomen or pelvis. No evidence of bowel obstruction. 2. High-grade sigmoid diverticulosis without evidence of acute diverticulitis. 3. Extensive atherosclerosis. 4. Unchanged bilobed distal abdominal aortic aneurysm measuring up to 3.2 cm. Recommend follow-up ultrasound every 3 years. (Ref.: J Vasc Surg. 2018; 67:2-77 and J Am Coll Radiol 2013;10(10):789-794.) 12/05/23 Abd xray 1 view IMPRESSION: Moderate to large stool burden.  No acute findings. 10/18/23 CT Chest/ABD/pelvis W contrast  IMPRESSION: 1. No acute intrathoracic or intra-abdominal pathology identified. No definite radiographic explanation for the patient's reported symptoms. 2. Extensive multi-vessel coronary artery calcification. 3. Severe sigmoid diverticulosis without superimposed  acute inflammatory change. 4. 3.5 cm infrarenal abdominal aortic aneurysm. Recommend follow-up ultrasound every 3 years. (Ref.: J Vasc Surg. 2018; 67:2-77 and J Am Coll Radiol 2013;10(10):789-794.) Aortic Atherosclerosis (ICD10-I70.0) and Emphysema (ICD10-J43.9). 05/04/22- echo- Left ventricular ejection fraction, by estimation, is 60 to 65%   Procedures: 10/20/23 EGD with Dr. Rhea Belton  Normal esophagus. 2 cm Hiatal Hernia. Acute, ulcerative  and erosive gastritis. Biopsied to exclude H. Pylori. Normal examined duodenum. Path:  FINAL MICROSCOPIC DIAGNOSIS:  A. STOMACH, BIOPSY:  Marked chronic active gastritis with lymphoid aggregates  Helicobacter present  Negative for intestinal metaplasia, dysplasia and carcinoma  Recommendations:  BID PPI recommended 8 weeks, and perhaps daily thereafter.  No NSAIDs 10/23/23 Biopsy positive for H. Pylori and PCP started patient on Quadruple therapy.  Assessment: 1. ***  Plan: -eradication of the H. pylori bacteria    Thank you for the courtesy of this consult. Please call me with any questions or concerns.   Chrsitopher Wik, FNP-C [facility] 01/24/2024, 7:36 AM  Cc: Modena Slater, DO

## 2024-01-25 ENCOUNTER — Ambulatory Visit

## 2024-01-25 DIAGNOSIS — N183 Chronic kidney disease, stage 3 unspecified: Secondary | ICD-10-CM | POA: Diagnosis not present

## 2024-01-25 DIAGNOSIS — R339 Retention of urine, unspecified: Secondary | ICD-10-CM | POA: Diagnosis not present

## 2024-01-25 DIAGNOSIS — J449 Chronic obstructive pulmonary disease, unspecified: Secondary | ICD-10-CM | POA: Diagnosis not present

## 2024-01-25 DIAGNOSIS — E46 Unspecified protein-calorie malnutrition: Secondary | ICD-10-CM | POA: Diagnosis not present

## 2024-01-25 DIAGNOSIS — G4733 Obstructive sleep apnea (adult) (pediatric): Secondary | ICD-10-CM | POA: Diagnosis not present

## 2024-01-25 DIAGNOSIS — G629 Polyneuropathy, unspecified: Secondary | ICD-10-CM | POA: Diagnosis not present

## 2024-01-25 DIAGNOSIS — I129 Hypertensive chronic kidney disease with stage 1 through stage 4 chronic kidney disease, or unspecified chronic kidney disease: Secondary | ICD-10-CM | POA: Diagnosis not present

## 2024-01-25 DIAGNOSIS — I700 Atherosclerosis of aorta: Secondary | ICD-10-CM | POA: Diagnosis not present

## 2024-01-25 DIAGNOSIS — J9610 Chronic respiratory failure, unspecified whether with hypoxia or hypercapnia: Secondary | ICD-10-CM | POA: Diagnosis not present

## 2024-01-25 DIAGNOSIS — D696 Thrombocytopenia, unspecified: Secondary | ICD-10-CM | POA: Diagnosis not present

## 2024-01-25 DIAGNOSIS — I7143 Infrarenal abdominal aortic aneurysm, without rupture: Secondary | ICD-10-CM | POA: Diagnosis not present

## 2024-01-25 DIAGNOSIS — K5900 Constipation, unspecified: Secondary | ICD-10-CM | POA: Diagnosis not present

## 2024-01-26 ENCOUNTER — Other Ambulatory Visit: Payer: Self-pay

## 2024-01-26 ENCOUNTER — Emergency Department (HOSPITAL_COMMUNITY)
Admission: EM | Admit: 2024-01-26 | Discharge: 2024-01-27 | Disposition: A | Discharge status: Home / Self Care | Attending: Emergency Medicine | Admitting: Emergency Medicine

## 2024-01-26 DIAGNOSIS — N3001 Acute cystitis with hematuria: Secondary | ICD-10-CM | POA: Diagnosis not present

## 2024-01-26 DIAGNOSIS — R531 Weakness: Secondary | ICD-10-CM | POA: Insufficient documentation

## 2024-01-26 DIAGNOSIS — R110 Nausea: Secondary | ICD-10-CM | POA: Insufficient documentation

## 2024-01-26 DIAGNOSIS — Z79899 Other long term (current) drug therapy: Secondary | ICD-10-CM | POA: Diagnosis not present

## 2024-01-26 DIAGNOSIS — R1013 Epigastric pain: Secondary | ICD-10-CM | POA: Diagnosis not present

## 2024-01-26 DIAGNOSIS — Z743 Need for continuous supervision: Secondary | ICD-10-CM | POA: Diagnosis not present

## 2024-01-26 DIAGNOSIS — G609 Hereditary and idiopathic neuropathy, unspecified: Secondary | ICD-10-CM | POA: Insufficient documentation

## 2024-01-26 DIAGNOSIS — G629 Polyneuropathy, unspecified: Secondary | ICD-10-CM

## 2024-01-26 LAB — URINALYSIS, W/ REFLEX TO CULTURE (INFECTION SUSPECTED)
Bilirubin Urine: NEGATIVE
Glucose, UA: NEGATIVE mg/dL
Hgb urine dipstick: NEGATIVE
Ketones, ur: 20 mg/dL — AB
Leukocytes,Ua: NEGATIVE
Nitrite: POSITIVE — AB
Protein, ur: 30 mg/dL — AB
Specific Gravity, Urine: 1.02 (ref 1.005–1.030)
pH: 5 (ref 5.0–8.0)

## 2024-01-26 LAB — COMPREHENSIVE METABOLIC PANEL WITH GFR
ALT: 10 U/L (ref 0–44)
AST: 22 U/L (ref 15–41)
Albumin: 2.8 g/dL — ABNORMAL LOW (ref 3.5–5.0)
Alkaline Phosphatase: 94 U/L (ref 38–126)
Anion gap: 14 (ref 5–15)
BUN: 13 mg/dL (ref 8–23)
CO2: 23 mmol/L (ref 22–32)
Calcium: 9.4 mg/dL (ref 8.9–10.3)
Chloride: 101 mmol/L (ref 98–111)
Creatinine, Ser: 1.11 mg/dL — ABNORMAL HIGH (ref 0.44–1.00)
GFR, Estimated: 56 mL/min — ABNORMAL LOW (ref >60)
Glucose, Bld: 85 mg/dL (ref 70–99)
Potassium: 3.7 mmol/L (ref 3.5–5.1)
Sodium: 138 mmol/L (ref 135–145)
Total Bilirubin: 1.5 mg/dL — ABNORMAL HIGH (ref 0.0–1.2)
Total Protein: 6.9 g/dL (ref 6.5–8.1)

## 2024-01-26 LAB — CBC WITH DIFFERENTIAL/PLATELET
Abs Immature Granulocytes: 0.04 10*3/uL (ref 0.00–0.07)
Basophils Absolute: 0.1 10*3/uL (ref 0.0–0.1)
Basophils Relative: 1 %
Eosinophils Absolute: 0.2 10*3/uL (ref 0.0–0.5)
Eosinophils Relative: 2 %
HCT: 49 % — ABNORMAL HIGH (ref 36.0–46.0)
Hemoglobin: 16.2 g/dL — ABNORMAL HIGH (ref 12.0–15.0)
Immature Granulocytes: 0 %
Lymphocytes Relative: 28 %
Lymphs Abs: 3.1 10*3/uL (ref 0.7–4.0)
MCH: 33.9 pg (ref 26.0–34.0)
MCHC: 33.1 g/dL (ref 30.0–36.0)
MCV: 102.5 fL — ABNORMAL HIGH (ref 80.0–100.0)
Monocytes Absolute: 1 10*3/uL (ref 0.1–1.0)
Monocytes Relative: 9 %
Neutro Abs: 6.6 10*3/uL (ref 1.7–7.7)
Neutrophils Relative %: 60 %
Platelets: 315 10*3/uL (ref 150–400)
RBC: 4.78 MIL/uL (ref 3.87–5.11)
RDW: 13.9 % (ref 11.5–15.5)
WBC: 10.9 10*3/uL — ABNORMAL HIGH (ref 4.0–10.5)
nRBC: 0 % (ref 0.0–0.2)

## 2024-01-26 LAB — LIPASE, BLOOD: Lipase: 32 U/L (ref 11–51)

## 2024-01-26 LAB — TROPONIN I (HIGH SENSITIVITY)
Troponin I (High Sensitivity): 6 ng/L (ref <18)
Troponin I (High Sensitivity): 6 ng/L (ref <18)

## 2024-01-26 MED ORDER — FAMOTIDINE IN NACL 20-0.9 MG/50ML-% IV SOLN
20.0000 mg | Freq: Once | INTRAVENOUS | Status: AC
  Administered 2024-01-26: 20 mg via INTRAVENOUS
  Filled 2024-01-26: qty 50

## 2024-01-26 MED ORDER — ONDANSETRON HCL 4 MG/2ML IJ SOLN
4.0000 mg | Freq: Once | INTRAMUSCULAR | Status: AC
  Administered 2024-01-26: 4 mg via INTRAVENOUS
  Filled 2024-01-26: qty 2

## 2024-01-26 MED ORDER — METOCLOPRAMIDE HCL 10 MG PO TABS: 5.0000 mg | ORAL_TABLET | Freq: Three times a day (TID) | ORAL | 1 refills | Status: AC

## 2024-01-26 MED ORDER — SODIUM CHLORIDE 0.9 % IV SOLN
1.0000 g | Freq: Once | INTRAVENOUS | Status: AC
  Administered 2024-01-26: 1 g via INTRAVENOUS
  Filled 2024-01-26: qty 10

## 2024-01-26 MED ORDER — ACETAMINOPHEN 325 MG PO TABS
650.0000 mg | ORAL_TABLET | Freq: Once | ORAL | Status: AC
  Administered 2024-01-26: 650 mg via ORAL
  Filled 2024-01-26: qty 2

## 2024-01-26 MED ORDER — SODIUM CHLORIDE 0.9 % IV BOLUS
500.0000 mL | Freq: Once | INTRAVENOUS | Status: AC
  Administered 2024-01-26: 500 mL via INTRAVENOUS

## 2024-01-26 MED ORDER — PANTOPRAZOLE SODIUM 40 MG PO TBEC
40.0000 mg | DELAYED_RELEASE_TABLET | Freq: Once | ORAL | Status: AC
  Administered 2024-01-26: 40 mg via ORAL
  Filled 2024-01-26: qty 1

## 2024-01-26 MED ORDER — CEPHALEXIN 500 MG PO CAPS: 500.0000 mg | ORAL_CAPSULE | Freq: Four times a day (QID) | ORAL | 0 refills | Status: AC

## 2024-01-26 MED ORDER — HYDROCODONE-ACETAMINOPHEN 5-325 MG PO TABS
1.0000 | ORAL_TABLET | Freq: Once | ORAL | Status: DC
  Filled 2024-01-26: qty 1

## 2024-01-26 MED ORDER — PREGABALIN 25 MG PO CAPS
50.0000 mg | ORAL_CAPSULE | Freq: Once | ORAL | Status: AC
  Administered 2024-01-26: 50 mg via ORAL
  Filled 2024-01-26: qty 2

## 2024-01-26 NOTE — ED Provider Notes (Signed)
 [place] EMERGENCY DEPARTMENT AT [facility] Provider Note   CSN: [id number] Arrival date & time: 01/26/24  1441     History  Chief Complaint  Patient presents with   Abdominal Pain   Weakness   Nausea    Kim [name] is a 63 y.o. female with a past medical history of H. pylori, chronic pain and neuropathy.  EMS gives report at bedside states that they were called out for failure to thrive picture.  Essentially over the past several days patient has refused to take any of her medications and has had increasing pain and neuropathy in her hands and feet.  Patient does state that she has felt more nauseous.  They report that since she was discharged from the hospital after admission for epigastric pain and H. pylori she has not improved very much.  She has not been having any fever vomiting or diarrhea.   Abdominal Pain Weakness Associated symptoms: abdominal pain        Home Medications Prior to Admission medications   Medication Sig Start Date End Date Taking? Authorizing Provider  cephALEXin (KEFLEX) 500 MG capsule Take 1 capsule (500 mg total) by mouth 4 (four) times daily. 01/26/24  Yes Arthor Captain, PA-C  acetaminophen (TYLENOL) 500 MG tablet Take 500 mg by mouth 3 (three) times daily as needed (pain).    [provider]  albuterol (VENTOLIN HFA) 108 (90 Base) MCG/ACT inhaler Inhale 2 puffs into the lungs every 4 (four) hours as needed for wheezing or shortness of breath. 10/07/22   Champ Mungo, DO  amLODipine (NORVASC) 5 MG tablet Take 1 tablet (5 mg total) by mouth daily. Patient not taking: Reported on 12/01/2023 08/12/22 10/11/22  Modena Slater, DO  capsaicin (ZOSTRIX) 0.025 % cream Apply topically as needed (apply feet for neuropathy). 12/14/23   Monna Fam, MD  metoCLOPramide (REGLAN) 10 MG tablet Take 0.5-1 tablets (5-10 mg total) by mouth 3 (three) times daily before meals. 01/26/24   Arthor Captain, PA-C  Multiple Vitamin (MULTIVITAMIN WITH  MINERALS) TABS tablet Take 1 tablet by mouth daily. 12/15/23   Monna Fam, MD  Nutritional Supplements (ENSURE COMPLETE SHAKE) LIQD Take 1 each by mouth in the morning and at bedtime. 10/20/23   Faith Rogue, DO  ondansetron (ZOFRAN-ODT) 4 MG disintegrating tablet Take 1 tablet (4 mg total) by mouth every 6 (six) hours. 11/30/23   May, Deanna J, NP  OXYGEN Inhale 3 L into the lungs as needed.    [provider]  pantoprazole (PROTONIX) 40 MG tablet Take 1 tablet (40 mg total) by mouth 2 (two) times daily for 8 days. 12/14/23 12/22/23  Monna Fam, MD  pantoprazole (PROTONIX) 40 MG tablet Take 1 tablet (40 mg total) by mouth daily. Start 1 tablet daily on 2/21 (after completed twice daily treatment for H.pylori) 12/22/23 12/21/24  Monna Fam, MD  pravastatin (PRAVACHOL) 20 MG tablet TAKE 1 TABLET BY MOUTH EVERYDAY AT BEDTIME Patient taking differently: Take 20 mg by mouth at bedtime. 12/13/22   Modena Slater, DO  pregabalin (LYRICA) 50 MG capsule Take 1 capsule (50 mg total) by mouth at bedtime. 12/14/23   Monna Fam, MD  senna-docusate (SENOKOT-S) 8.6-50 MG tablet Take 1 tablet by mouth at bedtime as needed for mild constipation. 05/09/22   Morene Crocker, MD  triamcinolone (KENALOG) 0.025 % ointment Apply 1 Application topically 2 (two) times daily as needed (Itching). 06/17/22   Champ Mungo, DO  umeclidinium bromide (INCRUSE ELLIPTA) 62.5 MCG/ACT AEPB Inhale  1 puff into the lungs daily. 03/07/23 03/06/24  Doran Stabler, DO      Allergies    Patient has no known allergies.    Review of Systems   Review of Systems  Gastrointestinal:  Positive for abdominal pain.  Neurological:  Positive for weakness.    Physical Exam Updated Vital Signs BP 125/73   Pulse 72   Temp 97.9 F (36.6 C) (Oral)   Resp 18   Ht 5\' 5"  (1.651 m)   Wt 77.1 kg   SpO2 98%   BMI 28.29 kg/m  Physical Exam Vitals and nursing note reviewed.  Constitutional:      General: She is not in acute distress.     Appearance: She is well-developed. She is not diaphoretic.  HENT:     Head: Normocephalic and atraumatic.     Right Ear: External ear normal.     Left Ear: External ear normal.     Nose: Nose normal.     Mouth/Throat:     Mouth: Mucous membranes are moist.  Eyes:     General: No scleral icterus.    Conjunctiva/sclera: Conjunctivae normal.  Cardiovascular:     Rate and Rhythm: Normal rate and regular rhythm.     Heart sounds: Normal heart sounds. No murmur heard.    No friction rub. No gallop.  Pulmonary:     Effort: Pulmonary effort is normal. No respiratory distress.     Breath sounds: Normal breath sounds.  Abdominal:     General: Bowel sounds are normal. There is no distension.     Palpations: Abdomen is soft. There is no mass.     Tenderness: There is no abdominal tenderness. There is no guarding.  Musculoskeletal:     Cervical back: Normal range of motion.  Skin:    General: Skin is warm and dry.  Neurological:     Mental Status: She is alert and oriented to person, place, and time.  Psychiatric:        Behavior: Behavior normal.     ED Results / Procedures / Treatments   Labs (all labs ordered are listed, but only abnormal results are displayed) Labs Reviewed  COMPREHENSIVE METABOLIC PANEL WITH GFR - Abnormal; Notable for the following components:      Result Value   Creatinine, Ser 1.11 (*)    Albumin 2.8 (*)    Total Bilirubin 1.5 (*)    GFR, Estimated 56 (*)    All other components within normal limits  CBC WITH DIFFERENTIAL/PLATELET - Abnormal; Notable for the following components:   WBC 10.9 (*)    Hemoglobin 16.2 (*)    HCT 49.0 (*)    MCV 102.5 (*)    All other components within normal limits  URINALYSIS, W/ REFLEX TO CULTURE (INFECTION SUSPECTED) - Abnormal; Notable for the following components:   Color, Urine AMBER (*)    Ketones, ur 20 (*)    Protein, ur 30 (*)    Nitrite POSITIVE (*)    Bacteria, UA MANY (*)    All other components within  normal limits  LIPASE, BLOOD  TROPONIN I (HIGH SENSITIVITY)  TROPONIN I (HIGH SENSITIVITY)    EKG EKG Interpretation Date/Time:  Friday January 26 2024 14:53:06 EDT Ventricular Rate:  92 PR Interval:  155 QRS Duration:  86 QT Interval:  399 QTC Calculation: 489 R Axis:   192  Text Interpretation: Sinus rhythm Right axis deviation Abnormal R-wave progression, late transition Borderline prolonged QT interval Previous anterior-inferior t-wave  inversions now upright compared to prior EKG Confirmed by Elayne Snare ([id number]) on 01/26/2024 3:32:56 PM  Radiology No results found.  Procedures Procedures    Medications Ordered in ED Medications  pregabalin (LYRICA) capsule 50 mg (50 mg Oral Given 01/26/24 1508)  sodium chloride 0.9 % bolus 500 mL (0 mLs Intravenous Stopped 01/26/24 1832)  ondansetron (ZOFRAN) injection 4 mg (4 mg Intravenous Given 01/26/24 1508)  famotidine (PEPCID) IVPB 20 mg premix (0 mg Intravenous Stopped 01/26/24 1600)  pantoprazole (PROTONIX) EC tablet 40 mg (40 mg Oral Given 01/26/24 1509)  cefTRIAXone (ROCEPHIN) 1 g in sodium chloride 0.9 % 100 mL IVPB (0 g Intravenous Stopped 01/26/24 2144)  acetaminophen (TYLENOL) tablet 650 mg (650 mg Oral Given 01/26/24 2144)    ED Course/ Medical Decision Making/ A&P Clinical Course as of 01/28/24 2142  Fri Jan 26, 2024  2052 SpO2: 91 % Patient sleeping  [AH]    Clinical Course User Index [AH] Arthor Captain, PA-C                                 Medical Decision Making Amount and/or Complexity of Data Reviewed Labs: ordered.  Risk OTC drugs. Prescription drug management.   This patient presents to the ED with chief complaint(s) of weakness with pertinent past medical history of COPD, H.Pylori, tobacco dependence  which further complicates the presenting complaint. The complaint involves an extensive differential diagnosis and treatment options and also carries with it a high risk of complications and morbidity.    The differential diagnosis of weakness includes but is not limited to neurologic causes (GBS, myasthenia gravis, CVA, MS, ALS, transverse myelitis, spinal cord injury, CVA, botulism, ) and other causes: ACS, Arrhythmia, syncope, orthostatic hypotension, sepsis, hypoglycemia, electrolyte disturbance, hypothyroidism, respiratory failure, symptomatic anemia, dehydration, heat injury, polypharmacy, malignancy.    The initial plan is to order labs and to treat patient with her home meds/ anitemetics for nausea and pain     Additional history obtained: Additional history obtained from  EMR    Reassessment and review (also see workup area): Lab Tests: I Ordered, and personally interpreted labs.  The pertinent results include:       Medicines ordered and prescription drug management: I ordered the following medications rocephin and tylenol for uti     Reevaluation of the patient after these medicines showed that the patient    improved      Complexity of problems addressed: Patient's presentation is most consistent with  acute illness/injury with systemic symptoms During patient's assessment  Disposition: After consideration of the diagnostic results and the patient's response to treatment,  I feel that the patent would benefit from discharge home with tx for UTI .          Final Clinical Impression(s) / ED Diagnoses Final diagnoses:  Acute cystitis with hematuria  Neuropathy    Rx / DC Orders ED Discharge Orders          Ordered    cephALEXin (KEFLEX) 500 MG capsule  4 times daily        01/26/24 2055    metoCLOPramide (REGLAN) 10 MG tablet  3 times daily before meals        01/26/24 2152              Arthor Captain, PA-C 01/28/24 2200    Glyn Ade, MD 01/29/24 1459

## 2024-01-26 NOTE — ED Notes (Signed)
 Got patient into a gown on the monitor did EKG shown to er provider patient is resting with call bell in reach got patient warm blanket and pillow

## 2024-01-26 NOTE — Discharge Instructions (Signed)
 Contact a health care provider if:  Your symptoms don't get better after 1-2 days of taking antibiotics.  Your symptoms go away and then come back.  You have a fever or chills.  You vomit or feel like you may vomit.    Get help right away if:  You have very bad pain in your back or lower belly.  You faint.

## 2024-01-26 NOTE — ED Notes (Signed)
 awaiting PTAR for transportation home.

## 2024-01-26 NOTE — ED Triage Notes (Signed)
 Pt to ED from home c/o weakness, lack of appetite, abdominal pain and nausea. Denies diarrhea. Pain in hands and feet. BG 90. 113/68 100 95% RA-COPD history

## 2024-01-27 DIAGNOSIS — Z7689 Persons encountering health services in other specified circumstances: Secondary | ICD-10-CM | POA: Diagnosis not present

## 2024-01-29 ENCOUNTER — Ambulatory Visit: Payer: Self-pay

## 2024-01-29 NOTE — Telephone Encounter (Signed)
  Chief Complaint: unable to walk, severe pain to hands and feet, sounds very sick and weak  Frequency: seen in ED Friday  Pertinent Negatives: Patient denies headacheneck pain Disposition: [] ED /[] Urgent Care (no appt availability in office) / [] Appointment(In office/virtual)/ []   Virtual Care/ [] Home Care/ [x] Refused Recommended Disposition /[]  Mobile Bus/ []  Follow-up with PCP Additional Notes: Despite given the warnings of not seeking care and the facet that she cannot pt refused 911. Called [facility] and spoke with Venita Sheffield and informed her that despite a lengthy conversation and advising pt that she could potentially risk her life by refusing to allow calling 911. Pt kepty saying "I was just there Friday." Advised pt she is not better and maybe worse since Friday. Advised pt disagreed with her decision and would advise her PCP.  Copied from CRM [id number]. Topic: Clinical - Red Word Triage >> Jan 29, 2024  9:25 AM Dondra Prader A wrote: Red Word that prompted transfer to Nurse Triage: Patient states her hands and feet are killing her. She can hardly get around and has not appetite, cannot eat. Reason for Disposition  Patient sounds very sick or weak to the triager  Answer Assessment - Initial Assessment Questions 2. LOCATION: "Where is the pain located?"     Both hands and feet  3. PAIN: "How bad is the pain?" (Scale 1-10; or mild, moderate, severe)   - MILD (1-3): doesn't interfere with normal activities   - MODERATE (4-7): interferes with normal activities (e.g., work or school) or awakens from sleep   - SEVERE (8-10): excruciating pain, unable to use hand at all     severe 4. WORK OR EXERCISE: "Has there been any recent work or exercise that involved this part (i.e., hand or wrist) of the body?"  no      7. OTHER SYMPTOMS: "Do you have any other symptoms?" (e.g., neck pain, swelling, rash, numbness, fever)     Tired sounds, on Oxygen  3 LPM, sounds veryweak unable to  walk  Protocols used: Hand and Wrist Pain-A-AH

## 2024-01-29 NOTE — Telephone Encounter (Signed)
 I made an appt for pt for 4/3 per  RN gladys .Marland Kitchen I tried to call pt and notify her of the appt and the reason for the appt but her phone did not  ring and went straight to  vmail a message came on stating pt vmail is full and can't receive any message  I tried twice I then pushed the appt out to 4/8 and place a letter in the mail  with the new appt date and time

## 2024-01-29 NOTE — Telephone Encounter (Signed)
 Call from [place] talked with patient refuses to go to ER.  Just wanted to notify doctor.

## 2024-01-30 DIAGNOSIS — I700 Atherosclerosis of aorta: Secondary | ICD-10-CM | POA: Diagnosis not present

## 2024-01-30 DIAGNOSIS — N183 Chronic kidney disease, stage 3 unspecified: Secondary | ICD-10-CM | POA: Diagnosis not present

## 2024-01-30 DIAGNOSIS — I7143 Infrarenal abdominal aortic aneurysm, without rupture: Secondary | ICD-10-CM | POA: Diagnosis not present

## 2024-01-30 DIAGNOSIS — K5900 Constipation, unspecified: Secondary | ICD-10-CM | POA: Diagnosis not present

## 2024-01-30 DIAGNOSIS — J9610 Chronic respiratory failure, unspecified whether with hypoxia or hypercapnia: Secondary | ICD-10-CM | POA: Diagnosis not present

## 2024-01-30 DIAGNOSIS — J449 Chronic obstructive pulmonary disease, unspecified: Secondary | ICD-10-CM | POA: Diagnosis not present

## 2024-01-30 DIAGNOSIS — D696 Thrombocytopenia, unspecified: Secondary | ICD-10-CM | POA: Diagnosis not present

## 2024-01-30 DIAGNOSIS — G629 Polyneuropathy, unspecified: Secondary | ICD-10-CM | POA: Diagnosis not present

## 2024-01-30 DIAGNOSIS — E46 Unspecified protein-calorie malnutrition: Secondary | ICD-10-CM | POA: Diagnosis not present

## 2024-01-30 DIAGNOSIS — G4733 Obstructive sleep apnea (adult) (pediatric): Secondary | ICD-10-CM | POA: Diagnosis not present

## 2024-01-30 DIAGNOSIS — R339 Retention of urine, unspecified: Secondary | ICD-10-CM | POA: Diagnosis not present

## 2024-01-30 DIAGNOSIS — I129 Hypertensive chronic kidney disease with stage 1 through stage 4 chronic kidney disease, or unspecified chronic kidney disease: Secondary | ICD-10-CM | POA: Diagnosis not present

## 2024-02-01 ENCOUNTER — Encounter: Admitting: Student

## 2024-02-01 DIAGNOSIS — I129 Hypertensive chronic kidney disease with stage 1 through stage 4 chronic kidney disease, or unspecified chronic kidney disease: Secondary | ICD-10-CM | POA: Diagnosis not present

## 2024-02-02 ENCOUNTER — Telehealth: Payer: Self-pay | Admitting: *Deleted

## 2024-02-02 NOTE — Telephone Encounter (Signed)
 Copied from CRM [id number]. Topic: Clinical - Home Health Verbal Orders >> Feb 01, 2024  5:00 PM Antony Haste wrote: Caller/Agency: [facility] Callback Number: [phone] Service Requested: Occupational Therapy Frequency: Order of OT for 2 weeks 4 Any new concerns about the patient? No

## 2024-02-02 NOTE — Telephone Encounter (Signed)
 Copied from CRM [id number]. Topic: Clinical - Home Health Verbal Orders >> Feb 02, 2024  9:07 AM Yvone Neu wrote: Caller/Agency: [facility] Callback Number: [phone] Service Requested: Occupational Therapy Frequency: Missed visit 02/01/2024, not answering phone, can't get a hold of patient Any new concerns about the patient? No

## 2024-02-02 NOTE — Telephone Encounter (Signed)
 Return call to [facility] OT with [facility]. No answer. Verbal order left on self-identified vm" Verbal order  for  OT for 2 weeks 4. And to call for any questions". Sending to [employer] for approval or denial.

## 2024-02-06 ENCOUNTER — Encounter: Admitting: Student

## 2024-02-07 DIAGNOSIS — E46 Unspecified protein-calorie malnutrition: Secondary | ICD-10-CM | POA: Diagnosis not present

## 2024-02-07 DIAGNOSIS — I700 Atherosclerosis of aorta: Secondary | ICD-10-CM | POA: Diagnosis not present

## 2024-02-07 DIAGNOSIS — I7143 Infrarenal abdominal aortic aneurysm, without rupture: Secondary | ICD-10-CM | POA: Diagnosis not present

## 2024-02-07 DIAGNOSIS — J9610 Chronic respiratory failure, unspecified whether with hypoxia or hypercapnia: Secondary | ICD-10-CM | POA: Diagnosis not present

## 2024-02-07 DIAGNOSIS — N183 Chronic kidney disease, stage 3 unspecified: Secondary | ICD-10-CM | POA: Diagnosis not present

## 2024-02-07 DIAGNOSIS — D696 Thrombocytopenia, unspecified: Secondary | ICD-10-CM | POA: Diagnosis not present

## 2024-02-07 DIAGNOSIS — K5900 Constipation, unspecified: Secondary | ICD-10-CM | POA: Diagnosis not present

## 2024-02-07 DIAGNOSIS — I129 Hypertensive chronic kidney disease with stage 1 through stage 4 chronic kidney disease, or unspecified chronic kidney disease: Secondary | ICD-10-CM | POA: Diagnosis not present

## 2024-02-07 DIAGNOSIS — J449 Chronic obstructive pulmonary disease, unspecified: Secondary | ICD-10-CM | POA: Diagnosis not present

## 2024-02-07 DIAGNOSIS — G629 Polyneuropathy, unspecified: Secondary | ICD-10-CM | POA: Diagnosis not present

## 2024-02-07 DIAGNOSIS — G4733 Obstructive sleep apnea (adult) (pediatric): Secondary | ICD-10-CM | POA: Diagnosis not present

## 2024-02-07 DIAGNOSIS — R339 Retention of urine, unspecified: Secondary | ICD-10-CM | POA: Diagnosis not present

## 2024-02-09 DIAGNOSIS — J449 Chronic obstructive pulmonary disease, unspecified: Secondary | ICD-10-CM | POA: Diagnosis not present

## 2024-02-09 DIAGNOSIS — I700 Atherosclerosis of aorta: Secondary | ICD-10-CM | POA: Diagnosis not present

## 2024-02-09 DIAGNOSIS — K5900 Constipation, unspecified: Secondary | ICD-10-CM | POA: Diagnosis not present

## 2024-02-09 DIAGNOSIS — G629 Polyneuropathy, unspecified: Secondary | ICD-10-CM | POA: Diagnosis not present

## 2024-02-09 DIAGNOSIS — E46 Unspecified protein-calorie malnutrition: Secondary | ICD-10-CM | POA: Diagnosis not present

## 2024-02-09 DIAGNOSIS — G4733 Obstructive sleep apnea (adult) (pediatric): Secondary | ICD-10-CM | POA: Diagnosis not present

## 2024-02-09 DIAGNOSIS — I129 Hypertensive chronic kidney disease with stage 1 through stage 4 chronic kidney disease, or unspecified chronic kidney disease: Secondary | ICD-10-CM | POA: Diagnosis not present

## 2024-02-09 DIAGNOSIS — D696 Thrombocytopenia, unspecified: Secondary | ICD-10-CM | POA: Diagnosis not present

## 2024-02-09 DIAGNOSIS — J9610 Chronic respiratory failure, unspecified whether with hypoxia or hypercapnia: Secondary | ICD-10-CM | POA: Diagnosis not present

## 2024-02-09 DIAGNOSIS — N183 Chronic kidney disease, stage 3 unspecified: Secondary | ICD-10-CM | POA: Diagnosis not present

## 2024-02-09 DIAGNOSIS — I7143 Infrarenal abdominal aortic aneurysm, without rupture: Secondary | ICD-10-CM | POA: Diagnosis not present

## 2024-02-09 DIAGNOSIS — R339 Retention of urine, unspecified: Secondary | ICD-10-CM | POA: Diagnosis not present

## 2024-02-10 DIAGNOSIS — Z419 Encounter for procedure for purposes other than remedying health state, unspecified: Secondary | ICD-10-CM | POA: Diagnosis not present

## 2024-02-13 ENCOUNTER — Encounter: Admitting: Student

## 2024-02-13 DIAGNOSIS — G4733 Obstructive sleep apnea (adult) (pediatric): Secondary | ICD-10-CM | POA: Diagnosis not present

## 2024-02-13 DIAGNOSIS — G629 Polyneuropathy, unspecified: Secondary | ICD-10-CM | POA: Diagnosis not present

## 2024-02-13 DIAGNOSIS — R339 Retention of urine, unspecified: Secondary | ICD-10-CM | POA: Diagnosis not present

## 2024-02-13 DIAGNOSIS — N183 Chronic kidney disease, stage 3 unspecified: Secondary | ICD-10-CM | POA: Diagnosis not present

## 2024-02-13 DIAGNOSIS — I700 Atherosclerosis of aorta: Secondary | ICD-10-CM | POA: Diagnosis not present

## 2024-02-13 DIAGNOSIS — D696 Thrombocytopenia, unspecified: Secondary | ICD-10-CM | POA: Diagnosis not present

## 2024-02-13 DIAGNOSIS — J449 Chronic obstructive pulmonary disease, unspecified: Secondary | ICD-10-CM | POA: Diagnosis not present

## 2024-02-13 DIAGNOSIS — E46 Unspecified protein-calorie malnutrition: Secondary | ICD-10-CM | POA: Diagnosis not present

## 2024-02-13 DIAGNOSIS — J9610 Chronic respiratory failure, unspecified whether with hypoxia or hypercapnia: Secondary | ICD-10-CM | POA: Diagnosis not present

## 2024-02-13 DIAGNOSIS — I129 Hypertensive chronic kidney disease with stage 1 through stage 4 chronic kidney disease, or unspecified chronic kidney disease: Secondary | ICD-10-CM | POA: Diagnosis not present

## 2024-02-13 DIAGNOSIS — I7143 Infrarenal abdominal aortic aneurysm, without rupture: Secondary | ICD-10-CM | POA: Diagnosis not present

## 2024-02-13 DIAGNOSIS — K5900 Constipation, unspecified: Secondary | ICD-10-CM | POA: Diagnosis not present

## 2024-02-13 NOTE — Progress Notes (Deleted)
   Established Patient Office Visit  Subjective   Patient ID: Kim [name], female    DOB: February 08, 1961  Age: 63 y.o. MRN: [id number]  No chief complaint on file.   Kim [name] is a 63 y.o. who presents to the clinic for ***. Please see problem based assessment and plan for additional details.   03/28:ED:  [id number] Reglan    H pylori: -Quadruple therapy, last date: 2/21 -neess to retets -any ppi -fasting for one hour -no tums  -needs to see GI  Patient Active Problem List   Diagnosis Date Noted  . Dehydration 12/12/2023  . Neuropathic pain 12/12/2023  . Epigastric pain 12/12/2023  . Constipation 12/08/2023  . H. pylori infection 12/07/2023  . Aortic atherosclerosis (HCC) 12/07/2023  . Intractable vomiting with nausea 12/06/2023  . Intractable nausea and vomiting 12/04/2023  . Helicobacter pylori gastritis 11/22/2023  . Colon cancer screening 11/22/2023  . Encounter for screening mammogram for malignant neoplasm of breast 11/22/2023  . Taking multiple medications for chronic disease 11/22/2023  . Multiple gastric ulcers 10/20/2023  . PUD (peptic ulcer disease) 10/20/2023  . Rapid weight loss, unintentional 10/18/2023  . Weight loss, unintentional 10/18/2023  . Pressure injury of skin 10/18/2023  . Polyneuropathy 10/11/2023  . Infrarenal abdominal aortic aneurysm (AAA) without rupture (HCC) 05/17/2022  . Respiratory failure, chronic (HCC) 05/17/2022  . Stable angina (HCC) 05/17/2022  . Erythrocytosis 05/02/2022  . Obesity, Class III, BMI 40-49.9 (morbid obesity) (HCC) 05/02/2022  . CKD (chronic kidney disease), stage III (HCC) 05/02/2022  . OSA (obstructive sleep apnea)   . COPD (chronic obstructive pulmonary disease) (HCC) 09/02/2019  . Essential hypertension 09/02/2019  . HLD (hyperlipidemia) 09/02/2019      Objective:     There were no vitals taken for this visit. BP Readings from Last 3 Encounters:  01/27/24 125/73  12/27/23 127/69  12/14/23  (!) 119/57   Wt Readings from Last 3 Encounters:  01/26/24 170 lb (77.1 kg)  12/27/23 171 lb 9.6 oz (77.8 kg)  12/05/23 164 lb (74.4 kg)      Physical Exam   No results found for any visits on 02/13/24.  Last metabolic panel Lab Results  Component Value Date   GLUCOSE 85 01/26/2024   NA 138 01/26/2024   K 3.7 01/26/2024   CL 101 01/26/2024   CO2 23 01/26/2024   BUN 13 01/26/2024   CREATININE 1.11 (H) 01/26/2024   GFRNONAA 56 (L) 01/26/2024   CALCIUM 9.4 01/26/2024   PHOS 2.6 12/11/2023   PROT 6.9 01/26/2024   ALBUMIN 2.8 (L) 01/26/2024   LABGLOB 3.5 09/07/2023   BILITOT 1.5 (H) 01/26/2024   ALKPHOS 94 01/26/2024   AST 22 01/26/2024   ALT 10 01/26/2024   ANIONGAP 14 01/26/2024   Last lipids Lab Results  Component Value Date   CHOL 176 05/03/2022   HDL 34 (L) 05/03/2022   LDLCALC 125 (H) 05/03/2022   TRIG 87 05/03/2022   CHOLHDL 5.2 05/03/2022   Last hemoglobin A1c Lab Results  Component Value Date   HGBA1C 5.2 10/11/2023      The 10-year ASCVD risk score (Arnett DK, et al., 2019) is: 9.8%    Assessment & Plan:   Problem List Items Addressed This Visit   None   No follow-ups on file.    Faith Rogue, DO

## 2024-02-14 ENCOUNTER — Encounter: Admitting: Student

## 2024-02-14 DIAGNOSIS — J9610 Chronic respiratory failure, unspecified whether with hypoxia or hypercapnia: Secondary | ICD-10-CM | POA: Diagnosis not present

## 2024-02-14 DIAGNOSIS — K5900 Constipation, unspecified: Secondary | ICD-10-CM | POA: Diagnosis not present

## 2024-02-14 DIAGNOSIS — R339 Retention of urine, unspecified: Secondary | ICD-10-CM | POA: Diagnosis not present

## 2024-02-14 DIAGNOSIS — I700 Atherosclerosis of aorta: Secondary | ICD-10-CM | POA: Diagnosis not present

## 2024-02-14 DIAGNOSIS — G4733 Obstructive sleep apnea (adult) (pediatric): Secondary | ICD-10-CM | POA: Diagnosis not present

## 2024-02-14 DIAGNOSIS — I129 Hypertensive chronic kidney disease with stage 1 through stage 4 chronic kidney disease, or unspecified chronic kidney disease: Secondary | ICD-10-CM | POA: Diagnosis not present

## 2024-02-14 DIAGNOSIS — J449 Chronic obstructive pulmonary disease, unspecified: Secondary | ICD-10-CM | POA: Diagnosis not present

## 2024-02-14 DIAGNOSIS — I7143 Infrarenal abdominal aortic aneurysm, without rupture: Secondary | ICD-10-CM | POA: Diagnosis not present

## 2024-02-14 DIAGNOSIS — G629 Polyneuropathy, unspecified: Secondary | ICD-10-CM | POA: Diagnosis not present

## 2024-02-14 DIAGNOSIS — E46 Unspecified protein-calorie malnutrition: Secondary | ICD-10-CM | POA: Diagnosis not present

## 2024-02-14 DIAGNOSIS — D696 Thrombocytopenia, unspecified: Secondary | ICD-10-CM | POA: Diagnosis not present

## 2024-02-14 DIAGNOSIS — N183 Chronic kidney disease, stage 3 unspecified: Secondary | ICD-10-CM | POA: Diagnosis not present

## 2024-02-15 DIAGNOSIS — J449 Chronic obstructive pulmonary disease, unspecified: Secondary | ICD-10-CM | POA: Diagnosis not present

## 2024-02-15 DIAGNOSIS — I129 Hypertensive chronic kidney disease with stage 1 through stage 4 chronic kidney disease, or unspecified chronic kidney disease: Secondary | ICD-10-CM | POA: Diagnosis not present

## 2024-02-15 DIAGNOSIS — N183 Chronic kidney disease, stage 3 unspecified: Secondary | ICD-10-CM | POA: Diagnosis not present

## 2024-02-15 DIAGNOSIS — D696 Thrombocytopenia, unspecified: Secondary | ICD-10-CM | POA: Diagnosis not present

## 2024-02-15 DIAGNOSIS — J9610 Chronic respiratory failure, unspecified whether with hypoxia or hypercapnia: Secondary | ICD-10-CM | POA: Diagnosis not present

## 2024-02-15 DIAGNOSIS — I7143 Infrarenal abdominal aortic aneurysm, without rupture: Secondary | ICD-10-CM | POA: Diagnosis not present

## 2024-02-15 DIAGNOSIS — G4733 Obstructive sleep apnea (adult) (pediatric): Secondary | ICD-10-CM | POA: Diagnosis not present

## 2024-02-15 DIAGNOSIS — G629 Polyneuropathy, unspecified: Secondary | ICD-10-CM | POA: Diagnosis not present

## 2024-02-15 DIAGNOSIS — I700 Atherosclerosis of aorta: Secondary | ICD-10-CM | POA: Diagnosis not present

## 2024-02-15 DIAGNOSIS — K5900 Constipation, unspecified: Secondary | ICD-10-CM | POA: Diagnosis not present

## 2024-02-15 DIAGNOSIS — E46 Unspecified protein-calorie malnutrition: Secondary | ICD-10-CM | POA: Diagnosis not present

## 2024-02-15 DIAGNOSIS — R339 Retention of urine, unspecified: Secondary | ICD-10-CM | POA: Diagnosis not present

## 2024-02-16 DIAGNOSIS — J9610 Chronic respiratory failure, unspecified whether with hypoxia or hypercapnia: Secondary | ICD-10-CM | POA: Diagnosis not present

## 2024-02-16 DIAGNOSIS — R339 Retention of urine, unspecified: Secondary | ICD-10-CM | POA: Diagnosis not present

## 2024-02-16 DIAGNOSIS — N183 Chronic kidney disease, stage 3 unspecified: Secondary | ICD-10-CM | POA: Diagnosis not present

## 2024-02-16 DIAGNOSIS — E46 Unspecified protein-calorie malnutrition: Secondary | ICD-10-CM | POA: Diagnosis not present

## 2024-02-16 DIAGNOSIS — I700 Atherosclerosis of aorta: Secondary | ICD-10-CM | POA: Diagnosis not present

## 2024-02-16 DIAGNOSIS — G629 Polyneuropathy, unspecified: Secondary | ICD-10-CM | POA: Diagnosis not present

## 2024-02-16 DIAGNOSIS — I7143 Infrarenal abdominal aortic aneurysm, without rupture: Secondary | ICD-10-CM | POA: Diagnosis not present

## 2024-02-16 DIAGNOSIS — G4733 Obstructive sleep apnea (adult) (pediatric): Secondary | ICD-10-CM | POA: Diagnosis not present

## 2024-02-16 DIAGNOSIS — J449 Chronic obstructive pulmonary disease, unspecified: Secondary | ICD-10-CM | POA: Diagnosis not present

## 2024-02-16 DIAGNOSIS — D696 Thrombocytopenia, unspecified: Secondary | ICD-10-CM | POA: Diagnosis not present

## 2024-02-16 DIAGNOSIS — K5900 Constipation, unspecified: Secondary | ICD-10-CM | POA: Diagnosis not present

## 2024-02-16 DIAGNOSIS — I129 Hypertensive chronic kidney disease with stage 1 through stage 4 chronic kidney disease, or unspecified chronic kidney disease: Secondary | ICD-10-CM | POA: Diagnosis not present

## 2024-02-19 ENCOUNTER — Telehealth: Payer: Self-pay | Admitting: *Deleted

## 2024-02-19 DIAGNOSIS — J449 Chronic obstructive pulmonary disease, unspecified: Secondary | ICD-10-CM | POA: Diagnosis not present

## 2024-02-19 DIAGNOSIS — G4733 Obstructive sleep apnea (adult) (pediatric): Secondary | ICD-10-CM | POA: Diagnosis not present

## 2024-02-19 DIAGNOSIS — I700 Atherosclerosis of aorta: Secondary | ICD-10-CM | POA: Diagnosis not present

## 2024-02-19 DIAGNOSIS — K5900 Constipation, unspecified: Secondary | ICD-10-CM | POA: Diagnosis not present

## 2024-02-19 DIAGNOSIS — D696 Thrombocytopenia, unspecified: Secondary | ICD-10-CM | POA: Diagnosis not present

## 2024-02-19 DIAGNOSIS — J9610 Chronic respiratory failure, unspecified whether with hypoxia or hypercapnia: Secondary | ICD-10-CM | POA: Diagnosis not present

## 2024-02-19 DIAGNOSIS — I7143 Infrarenal abdominal aortic aneurysm, without rupture: Secondary | ICD-10-CM | POA: Diagnosis not present

## 2024-02-19 DIAGNOSIS — G629 Polyneuropathy, unspecified: Secondary | ICD-10-CM | POA: Diagnosis not present

## 2024-02-19 DIAGNOSIS — I129 Hypertensive chronic kidney disease with stage 1 through stage 4 chronic kidney disease, or unspecified chronic kidney disease: Secondary | ICD-10-CM | POA: Diagnosis not present

## 2024-02-19 DIAGNOSIS — E46 Unspecified protein-calorie malnutrition: Secondary | ICD-10-CM | POA: Diagnosis not present

## 2024-02-19 DIAGNOSIS — R339 Retention of urine, unspecified: Secondary | ICD-10-CM | POA: Diagnosis not present

## 2024-02-19 DIAGNOSIS — N183 Chronic kidney disease, stage 3 unspecified: Secondary | ICD-10-CM | POA: Diagnosis not present

## 2024-02-19 NOTE — Telephone Encounter (Signed)
 Per [facility] patient canceled but will call back to reschedule appointment.

## 2024-02-20 ENCOUNTER — Ambulatory Visit: Payer: Self-pay

## 2024-02-20 NOTE — Telephone Encounter (Signed)
  Chief Complaint: nausea, foot & hand pain, urinary symptoms Symptoms: pain, difficulty emptying bladder, fever Frequency: ongoing for "awhile" Pertinent Negatives: Patient denies fever Disposition: [] ED /[] Urgent Care (no appt availability in office) / [x] Appointment(In office/virtual)/ []  [place] [facility]/ [] Home Care/ [] Refused Recommended Disposition /[] [place] [facility]/ []  Follow-up with PCP Additional Notes:  Patient calling in requesting an appointment for multiple symptoms. She ran out of her Zofran  and experiencing nausea for "awhile".  She also would like evaluation of her hands and feet, bilateral hands and feet have pain, tingling "for awhile". She was unable to provide length of time symptoms are present, nausea is chronic, hand and foot symptoms are ongoing. She also states she her bladder felt full but didn't feel she emptied her bladder entirely.   Copied from CRM [id number]. Topic: Clinical - Red Word Triage >> Feb 20, 2024  4:28 PM Blair Bumpers wrote: Red Word that prompted transfer to Nurse Triage: Patient wants an appt to see her provider. States she needs to be seen due to problems with feet & hands, states she hasn't eaten in 3 weeks but she just nibbles on food. Patient also states she is nauseous and it's hard for her to walk. Reason for Disposition  Urination is difficult to start (i.e., hesitancy) or straining  [1] MODERATE pain (e.g., interferes with normal activities) AND [2] present > 3 days  Protocols used: Urinary Symptoms-A-AH, Hand and Wrist Pain-A-AH

## 2024-02-21 ENCOUNTER — Ambulatory Visit: Payer: Self-pay | Admitting: Student

## 2024-02-21 ENCOUNTER — Ambulatory Visit: Payer: Self-pay

## 2024-02-21 NOTE — Progress Notes (Deleted)
   Acute Office Visit  Subjective:     Patient ID: Kim [name], female    DOB: 1961-02-27, 63 y.o.   MRN: [id number]  No chief complaint on file.   HPI This is a 63 year old female past history of COPD on 3L prn, HTN, CKD, recent diagnosis of H. Pylori and gastritis treated with quad therapy 12/22/2023  ROS      Objective:    There were no vitals taken for this visit. {Vitals History (Optional):23777}  Physical Exam  No results found for any visits on 02/21/24.      Assessment & Plan:  Nausea, chronic HX of H pylori noted on surgical pathology stomach 10/20/2023, treated with quad therapy. Will need repeat H pylori breath test.   Previosuly tried on zofran  8 mg q8h  - Reglan  5 mg TID with meals     Problem List Items Addressed This Visit   None   No orders of the defined types were placed in this encounter.   No follow-ups on file.  Lanney Pitts, DO

## 2024-02-21 NOTE — Telephone Encounter (Signed)
 Pt has an appt today w/Dr Roya.

## 2024-02-21 NOTE — Telephone Encounter (Signed)
 Chief Complaint: weakness Symptoms: weakness, nausea, abdominal pain, pain, tingling, and numbness in hands and feet Pertinent Negatives: Patient denies CP Disposition: [] ED /[] Urgent Care (no appt availability in office) / [] Appointment(In office/virtual)/ []  [place] [facility]/ [] Home Care/ [x] Refused Recommended Disposition /[] [place] [facility]/ []  Follow-up with PCP Additional Notes: Pt called to cancel her appt today at 1345. Pt was triaged for her symptoms yesterday. Pt states she is so weak she can barely walk. Pt also endorses nausea, abdominal pain, and pain, numbness, and tingling to her hands and feet. Pt endorses SOB at rest. Pt unable to check her O2 sat. She denies dizziness. Pt states "I do not feel like talking right now, I just want to cancel my appt." For weakness and SOB at rest RN advised pt she needs to go to the ED. RN advised pt RN would like to call 911. Pt declined ED and EMS at this time. Pt states she prefers to rest now and may go to the ED later. RN called [facility] to inform. RN advised pt if she decides to go to the ED she needs to call 911, especially if she develops CP or worsening SOB. Pt verbalized understanding.   Reason for Disposition  [1] SEVERE weakness (i.e., unable to walk or barely able to walk, requires support) AND [2] new-onset or worsening  Answer Assessment - Initial Assessment Questions 1. DESCRIPTION: "Describe how you are feeling."     "So weak I can barely walk" 2. SEVERITY: "How bad is it?"  "Can you stand and walk?"   - MILD (0-3): Feels weak or tired, but does not interfere with work, school or normal activities.   - MODERATE (4-7): Able to stand and walk; weakness interferes with work, school, or normal activities.   - SEVERE (8-10): Unable to stand or walk; unable to do usual activities.     Sounds severe  3. ONSET: "When did these symptoms begin?" (e.g., hours, days, weeks, months)     Called yesterday about symptoms - states symptoms  are worsening and she needs to cancel her appt 4. CAUSE: "What do you think is causing the weakness or fatigue?" (e.g., not drinking enough fluids, medical problem, trouble sleeping)     N/A 5. NEW MEDICINES:  "Have you started on any new medicines recently?" (e.g., opioid pain medicines, benzodiazepines, muscle relaxants, antidepressants, antihistamines, neuroleptics, beta blockers)     N/A 6. OTHER SYMPTOMS: "Do you have any other symptoms?" (e.g., chest pain, fever, cough, SOB, vomiting, diarrhea, bleeding, other areas of pain)     Pain, tingling, numbness in hands and feet, nausea, abdominal pain since yesterday, pt states she thinks she needs to have a BM ("last BM was a few days ago"), SOB at rest and with exertion. Does not know her O2 sat at this time. Denies CP. Denies dizziness. "I don't feel like talking right now."  Protocols used: Weakness (Generalized) and Fatigue-A-AH

## 2024-02-22 DIAGNOSIS — I129 Hypertensive chronic kidney disease with stage 1 through stage 4 chronic kidney disease, or unspecified chronic kidney disease: Secondary | ICD-10-CM | POA: Diagnosis not present

## 2024-02-22 DIAGNOSIS — J9610 Chronic respiratory failure, unspecified whether with hypoxia or hypercapnia: Secondary | ICD-10-CM | POA: Diagnosis not present

## 2024-02-22 DIAGNOSIS — R339 Retention of urine, unspecified: Secondary | ICD-10-CM | POA: Diagnosis not present

## 2024-02-22 DIAGNOSIS — G629 Polyneuropathy, unspecified: Secondary | ICD-10-CM | POA: Diagnosis not present

## 2024-02-22 DIAGNOSIS — I700 Atherosclerosis of aorta: Secondary | ICD-10-CM | POA: Diagnosis not present

## 2024-02-22 DIAGNOSIS — N183 Chronic kidney disease, stage 3 unspecified: Secondary | ICD-10-CM | POA: Diagnosis not present

## 2024-02-22 DIAGNOSIS — J449 Chronic obstructive pulmonary disease, unspecified: Secondary | ICD-10-CM | POA: Diagnosis not present

## 2024-02-22 DIAGNOSIS — D696 Thrombocytopenia, unspecified: Secondary | ICD-10-CM | POA: Diagnosis not present

## 2024-02-22 DIAGNOSIS — K5900 Constipation, unspecified: Secondary | ICD-10-CM | POA: Diagnosis not present

## 2024-02-22 DIAGNOSIS — G4733 Obstructive sleep apnea (adult) (pediatric): Secondary | ICD-10-CM | POA: Diagnosis not present

## 2024-02-22 DIAGNOSIS — E46 Unspecified protein-calorie malnutrition: Secondary | ICD-10-CM | POA: Diagnosis not present

## 2024-02-22 DIAGNOSIS — I7143 Infrarenal abdominal aortic aneurysm, without rupture: Secondary | ICD-10-CM | POA: Diagnosis not present

## 2024-02-27 DIAGNOSIS — R339 Retention of urine, unspecified: Secondary | ICD-10-CM | POA: Diagnosis not present

## 2024-02-27 DIAGNOSIS — E46 Unspecified protein-calorie malnutrition: Secondary | ICD-10-CM | POA: Diagnosis not present

## 2024-02-27 DIAGNOSIS — J9610 Chronic respiratory failure, unspecified whether with hypoxia or hypercapnia: Secondary | ICD-10-CM | POA: Diagnosis not present

## 2024-02-27 DIAGNOSIS — G629 Polyneuropathy, unspecified: Secondary | ICD-10-CM | POA: Diagnosis not present

## 2024-02-27 DIAGNOSIS — G4733 Obstructive sleep apnea (adult) (pediatric): Secondary | ICD-10-CM | POA: Diagnosis not present

## 2024-02-27 DIAGNOSIS — J449 Chronic obstructive pulmonary disease, unspecified: Secondary | ICD-10-CM | POA: Diagnosis not present

## 2024-02-27 DIAGNOSIS — D696 Thrombocytopenia, unspecified: Secondary | ICD-10-CM | POA: Diagnosis not present

## 2024-02-27 DIAGNOSIS — I700 Atherosclerosis of aorta: Secondary | ICD-10-CM | POA: Diagnosis not present

## 2024-02-27 DIAGNOSIS — N183 Chronic kidney disease, stage 3 unspecified: Secondary | ICD-10-CM | POA: Diagnosis not present

## 2024-02-27 DIAGNOSIS — K5900 Constipation, unspecified: Secondary | ICD-10-CM | POA: Diagnosis not present

## 2024-02-27 DIAGNOSIS — I129 Hypertensive chronic kidney disease with stage 1 through stage 4 chronic kidney disease, or unspecified chronic kidney disease: Secondary | ICD-10-CM | POA: Diagnosis not present

## 2024-02-27 DIAGNOSIS — I7143 Infrarenal abdominal aortic aneurysm, without rupture: Secondary | ICD-10-CM | POA: Diagnosis not present

## 2024-02-28 ENCOUNTER — Emergency Department (HOSPITAL_COMMUNITY)
Admission: EM | Admit: 2024-02-28 | Discharge: 2024-02-29 | Disposition: A | Discharge status: Home / Self Care | Attending: Emergency Medicine | Admitting: Emergency Medicine

## 2024-02-28 ENCOUNTER — Emergency Department (HOSPITAL_COMMUNITY)

## 2024-02-28 ENCOUNTER — Other Ambulatory Visit: Payer: Self-pay

## 2024-02-28 DIAGNOSIS — D72829 Elevated white blood cell count, unspecified: Secondary | ICD-10-CM | POA: Insufficient documentation

## 2024-02-28 DIAGNOSIS — R531 Weakness: Secondary | ICD-10-CM | POA: Diagnosis not present

## 2024-02-28 DIAGNOSIS — E876 Hypokalemia: Secondary | ICD-10-CM | POA: Diagnosis not present

## 2024-02-28 DIAGNOSIS — I10 Essential (primary) hypertension: Secondary | ICD-10-CM | POA: Diagnosis not present

## 2024-02-28 DIAGNOSIS — Z79899 Other long term (current) drug therapy: Secondary | ICD-10-CM | POA: Insufficient documentation

## 2024-02-28 DIAGNOSIS — R062 Wheezing: Secondary | ICD-10-CM | POA: Diagnosis not present

## 2024-02-28 DIAGNOSIS — R0602 Shortness of breath: Secondary | ICD-10-CM | POA: Diagnosis not present

## 2024-02-28 DIAGNOSIS — E871 Hypo-osmolality and hyponatremia: Secondary | ICD-10-CM | POA: Diagnosis not present

## 2024-02-28 DIAGNOSIS — F17210 Nicotine dependence, cigarettes, uncomplicated: Secondary | ICD-10-CM | POA: Diagnosis not present

## 2024-02-28 DIAGNOSIS — R059 Cough, unspecified: Secondary | ICD-10-CM | POA: Diagnosis not present

## 2024-02-28 DIAGNOSIS — R10819 Abdominal tenderness, unspecified site: Secondary | ICD-10-CM | POA: Diagnosis not present

## 2024-02-28 DIAGNOSIS — J449 Chronic obstructive pulmonary disease, unspecified: Secondary | ICD-10-CM | POA: Insufficient documentation

## 2024-02-28 LAB — URINALYSIS, ROUTINE W REFLEX MICROSCOPIC
Bilirubin Urine: NEGATIVE
Glucose, UA: NEGATIVE mg/dL
Hgb urine dipstick: NEGATIVE
Ketones, ur: 5 mg/dL — AB
Leukocytes,Ua: NEGATIVE
Nitrite: NEGATIVE
Protein, ur: NEGATIVE mg/dL
Specific Gravity, Urine: 1.006 (ref 1.005–1.030)
pH: 6 (ref 5.0–8.0)

## 2024-02-28 LAB — BASIC METABOLIC PANEL WITH GFR
Anion gap: 14 (ref 5–15)
BUN: 10 mg/dL (ref 8–23)
CO2: 23 mmol/L (ref 22–32)
Calcium: 9 mg/dL (ref 8.9–10.3)
Chloride: 93 mmol/L — ABNORMAL LOW (ref 98–111)
Creatinine, Ser: 1.15 mg/dL — ABNORMAL HIGH (ref 0.44–1.00)
GFR, Estimated: 54 mL/min — ABNORMAL LOW (ref >60)
Glucose, Bld: 72 mg/dL (ref 70–99)
Potassium: 3.1 mmol/L — ABNORMAL LOW (ref 3.5–5.1)
Sodium: 130 mmol/L — ABNORMAL LOW (ref 135–145)

## 2024-02-28 LAB — LIPASE, BLOOD: Lipase: 29 U/L (ref 11–51)

## 2024-02-28 LAB — CBC
HCT: 44.9 % (ref 36.0–46.0)
Hemoglobin: 15.2 g/dL — ABNORMAL HIGH (ref 12.0–15.0)
MCH: 32.8 pg (ref 26.0–34.0)
MCHC: 33.9 g/dL (ref 30.0–36.0)
MCV: 96.8 fL (ref 80.0–100.0)
Platelets: 275 10*3/uL (ref 150–400)
RBC: 4.64 MIL/uL (ref 3.87–5.11)
RDW: 14.5 % (ref 11.5–15.5)
WBC: 11.5 10*3/uL — ABNORMAL HIGH (ref 4.0–10.5)
nRBC: 0 % (ref 0.0–0.2)

## 2024-02-28 LAB — HEPATIC FUNCTION PANEL
ALT: 11 U/L (ref 0–44)
AST: 22 U/L (ref 15–41)
Albumin: 2.5 g/dL — ABNORMAL LOW (ref 3.5–5.0)
Alkaline Phosphatase: 91 U/L (ref 38–126)
Bilirubin, Direct: 0.3 mg/dL — ABNORMAL HIGH (ref 0.0–0.2)
Indirect Bilirubin: 1.1 mg/dL — ABNORMAL HIGH (ref 0.3–0.9)
Total Bilirubin: 1.4 mg/dL — ABNORMAL HIGH (ref 0.0–1.2)
Total Protein: 6.5 g/dL (ref 6.5–8.1)

## 2024-02-28 MED ORDER — SODIUM CHLORIDE 0.9 % IV BOLUS
500.0000 mL | Freq: Once | INTRAVENOUS | Status: AC
Start: 2024-02-28 — End: 2024-02-28
  Administered 2024-02-28: 500 mL via INTRAVENOUS

## 2024-02-28 NOTE — ED Provider Notes (Signed)
 [place] EMERGENCY DEPARTMENT AT [facility] Provider Note   CSN: [id number] Arrival date & time: 02/28/24  1656     History COPD, HTN, high cholesterol. No chief complaint on file.   Kim [name] is a 63 y.o. female.  63 y.o female with a PMH of COPD, HTN, High cholesterol presents to the ED with a chief complaint of sob, weakness x a couple of days. Patient reports feeling overall weak, spending most of her days in bed. She also endorses tobacco use, about a pack of cigarettes a day. She is also reporting a sore to her buttocks. No oxygen  at baseline but occasionally. She was placed on oxygen  by EMS. No fever, no chest pain or leg swelling.   The history is provided by the patient and the EMS personnel.       Home Medications Prior to Admission medications   Medication Sig Start Date End Date Taking? Authorizing Provider  acetaminophen  (TYLENOL ) 500 MG tablet Take 500 mg by mouth 3 (three) times daily as needed (pain).    [provider]  albuterol  (VENTOLIN  HFA) 108 (90 Base) MCG/ACT inhaler Inhale 2 puffs into the lungs every 4 (four) hours as needed for wheezing or shortness of breath. 10/07/22   Malen Scudder, DO  amLODipine  (NORVASC ) 5 MG tablet Take 1 tablet (5 mg total) by mouth daily. Patient not taking: Reported on 12/01/2023 08/12/22 10/11/22  Jonelle Neri, DO  capsaicin  (ZOSTRIX) 0.025 % cream Apply topically as needed (apply feet for neuropathy). 12/14/23   Jayson Michael, MD  cephALEXin  (KEFLEX ) 500 MG capsule Take 1 capsule (500 mg total) by mouth 4 (four) times daily. 01/26/24   Harris, Abigail, PA-C  metoCLOPramide  (REGLAN ) 10 MG tablet Take 0.5-1 tablets (5-10 mg total) by mouth 3 (three) times daily before meals. 01/26/24   Harris, Abigail, PA-C  Multiple Vitamin (MULTIVITAMIN WITH MINERALS) TABS tablet Take 1 tablet by mouth daily. 12/15/23   Jayson Michael, MD  Nutritional Supplements (ENSURE COMPLETE SHAKE) LIQD Take 1 each by mouth in the morning  and at bedtime. 10/20/23   Aurora Lees, DO  ondansetron  (ZOFRAN -ODT) 4 MG disintegrating tablet Take 1 tablet (4 mg total) by mouth every 6 (six) hours. 11/30/23   May, Deanna J, NP  OXYGEN  Inhale 3 L into the lungs as needed.    [provider]  pantoprazole  (PROTONIX ) 40 MG tablet Take 1 tablet (40 mg total) by mouth 2 (two) times daily for 8 days. 12/14/23 12/22/23  Jayson Michael, MD  pantoprazole  (PROTONIX ) 40 MG tablet Take 1 tablet (40 mg total) by mouth daily. Start 1 tablet daily on 2/21 (after completed twice daily treatment for H.pylori) 12/22/23 12/21/24  Jayson Michael, MD  pravastatin  (PRAVACHOL ) 20 MG tablet TAKE 1 TABLET BY MOUTH EVERYDAY AT BEDTIME Patient taking differently: Take 20 mg by mouth at bedtime. 12/13/22   Jonelle Neri, DO  pregabalin  (LYRICA ) 50 MG capsule Take 1 capsule (50 mg total) by mouth at bedtime. 12/14/23   Jayson Michael, MD  senna-docusate (SENOKOT-S) 8.6-50 MG tablet Take 1 tablet by mouth at bedtime as needed for mild constipation. 05/09/22   Cathey Clunes, MD  triamcinolone  (KENALOG ) 0.025 % ointment Apply 1 Application topically 2 (two) times daily as needed (Itching). 06/17/22   Malen Scudder, DO  umeclidinium bromide  (INCRUSE ELLIPTA ) 62.5 MCG/ACT AEPB Inhale 1 puff into the lungs daily. 03/07/23 03/06/24  Nguyen, Quan, DO      Allergies    Patient has no known allergies.  Review of Systems   Review of Systems  Constitutional:  Negative for chills and fever.  HENT:  Negative for sore throat.   Respiratory:  Positive for cough and shortness of breath. Negative for wheezing.   Cardiovascular:  Negative for chest pain.  Gastrointestinal:  Negative for abdominal pain, diarrhea, nausea and vomiting.  Genitourinary:  Negative for flank pain.  Musculoskeletal:  Negative for back pain.  Skin:  Positive for wound. Negative for pallor.  Neurological:  Negative for light-headedness and headaches.  All other systems reviewed and are  negative.   Physical Exam Updated Vital Signs BP 121/66   Pulse 80   Temp 97.7 F (36.5 C) (Oral)   Resp 17   Ht 5\' 5"  (1.651 m)   SpO2 100%   BMI 28.29 kg/m  Physical Exam Vitals and nursing note reviewed.  Constitutional:      Appearance: She is ill-appearing.  HENT:     Head: Normocephalic and atraumatic.     Mouth/Throat:     Mouth: Mucous membranes are dry.  Eyes:     Pupils: Pupils are equal, round, and reactive to light.  Cardiovascular:     Rate and Rhythm: Normal rate.  Pulmonary:     Breath sounds: Wheezing present.  Abdominal:     General: Abdomen is flat.     Palpations: Abdomen is soft.     Tenderness: There is abdominal tenderness.     Comments: Diffusely tender.   Musculoskeletal:     Cervical back: Normal range of motion and neck supple.     Right lower leg: No edema.     Left lower leg: No edema.  Skin:    Nails: There is clubbing.       Neurological:     Mental Status: She is alert.    ED Results / Procedures / Treatments   Labs (all labs ordered are listed, but only abnormal results are displayed) Labs Reviewed  CBC - Abnormal; Notable for the following components:      Result Value   WBC 11.5 (*)    Hemoglobin 15.2 (*)    All other components within normal limits  BASIC METABOLIC PANEL WITH GFR - Abnormal; Notable for the following components:   Sodium 130 (*)    Potassium 3.1 (*)    Chloride 93 (*)    Creatinine, Ser 1.15 (*)    GFR, Estimated 54 (*)    All other components within normal limits  HEPATIC FUNCTION PANEL - Abnormal; Notable for the following components:   Albumin 2.5 (*)    Total Bilirubin 1.4 (*)    Bilirubin, Direct 0.3 (*)    Indirect Bilirubin 1.1 (*)    All other components within normal limits  URINALYSIS, ROUTINE W REFLEX MICROSCOPIC - Abnormal; Notable for the following components:   Ketones, ur 5 (*)    All other components within normal limits  SARS CORONAVIRUS 2 BY RT PCR  LIPASE, BLOOD    EKG EKG  Interpretation Date/Time:  Wednesday February 28 2024 17:57:28 EDT Ventricular Rate:  80 PR Interval:  179 QRS Duration:  90 QT Interval:  414 QTC Calculation: 478 R Axis:   193  Text Interpretation: Sinus rhythm Right axis deviation When compared with ECG of 01/26/2024, No significant change was found Confirmed by Alissa April ([id number]) on 02/28/2024 11:30:52 PM  Radiology DG Chest 1 View Result Date: 02/28/2024 CLINICAL DATA:  Short of breath EXAM: CHEST  1 VIEW COMPARISON:  12/05/2023 FINDINGS: Single frontal  view of the chest demonstrates an unremarkable cardiac silhouette. No acute airspace disease, effusion, or pneumothorax. No acute bony abnormalities. IMPRESSION: 1. No acute intrathoracic process. Electronically Signed   By: Bobbye Burrow M.D.   On: 02/28/2024 18:33    Procedures Procedures    Medications Ordered in ED Medications  sodium chloride  0.9 % bolus 500 mL (0 mLs Intravenous Stopped 02/28/24 2055)    ED Course/ Medical Decision Making/ A&P Clinical Course as of 02/28/24 2344  Wed Feb 28, 2024  2005 Stable SOB COPD 3L at baseline  Weakness is aoc [CC]  2247 TOC [CC]    Clinical Course User Index [CC] Onetha Bile, MD                                 Medical Decision Making Amount and/or Complexity of Data Reviewed Labs: ordered. Radiology: ordered.    This patient presents to the ED for concern of weakness, this involves a number of treatment options, and is a complaint that carries with it a high risk of complications and morbidity.  The differential diagnosis includes infection, failure to thrive versus CVA.    Co morbidities: Discussed in HPI   Brief History:  See HPI.   EMR reviewed including pt PMHx, past surgical history and past visits to ER.   See HPI for more details   Lab Tests:  I ordered and independently interpreted labs.  The pertinent results include:    CBC with leukocytosis but at his baseline, hemoglobin  hemoconcentrated, notes underlying history of smoking.  BMP with slight decrease in sodium, slight decrease in potassium.  Creatinine level is within her baseline.  UA without nitrites, no leukocytes consistent with any infection.  Lipase levels normal.  Imaging Studies:  X-ray of the chest did not show any acute findings.  Cardiac Monitoring:  The patient was maintained on a cardiac monitor.  I personally viewed and interpreted the cardiac monitored which showed an underlying rhythm of: NSR 80 EKG non-ischemic   Medicines ordered:  I ordered medication including bolus  for symptomatic treatment Reevaluation of the patient after these medicines showed that the patient stayed the same I have reviewed the patients home medicines and have made adjustments as needed  Consults:  I requested consultation with TOC,  and discussed lab and imaging findings as well as pertinent plan - awaiting their call back.   Reevaluation:  After the interventions noted above I re-evaluated patient and found that they have :stayed the same  Social Determinants of Health:  Social work/case management involved   Problem List / ED Course:  Patient presents to the ED via EMS from home for complaint of weakness, has not gotten out of bed in several days, continues to smoke 1 pack of cigarettes a day.  Evaluated in the emergency department about a month ago and treated for urinary tract infection, reports completion of antibiotics.  However, she feels that the urinary tract infection may be back because she feels weak throughout.  On primary evaluation, patient appears [place] ill, is wearing oxygen , although she does not wear any oxygen  at baseline this was placed for transport.  She has breath sounds to auscultation bilaterally, does have an ongoing history of tobacco use.  Abdomen is soft, not focally tender to palpation.  She is complaining of a wound to her buttocks, upon evaluation with nursing staff, I do  see some skin breaking down, a  padding adhesive has been placed to this area. Labs on today's visit do not show any acute finding, CBC with slight increase in leukocytosis, she is hemoconcentrated due to her underlying history of tobacco use.  BMP with slight decrease in some of her electrolytes however stable.  Creatinine level is normal.  UA without any nitrate, no leukocyte present.  Chest x-ray does not show any pneumonia, I did discuss results with patient at length.  She currently resides with her husband along with daughter, she is requesting placement at this time due to her deconditioning, she does not leave the bed in several days.  I have placed a call to [facility] for further arrangements. Patient is medically cleared but will need TOC for placement.   Dispostion:  After consideration of the diagnostic results and the patients response to treatment, I feel that the patent would benefit from admission into a facility to improve her deconditioning.     Portions of this note were generated with Scientist, clinical (histocompatibility and immunogenetics). Dictation errors may occur despite best attempts at proofreading.   Final Clinical Impression(s) / ED Diagnoses Final diagnoses:  Weakness    Rx / DC Orders ED Discharge Orders     None         Kendyl Bissonnette, PA-C 02/28/24 2344    Onetha Bile, MD 03/07/24 2333

## 2024-02-28 NOTE — ED Notes (Signed)
 Called Case Mgr and left VM regarding TOC consult

## 2024-02-28 NOTE — ED Triage Notes (Signed)
 Pt bib ems from home; hx copd; 3L baseline; 2 days ago endorses increased  difficulty ambulating, c/o gen weakness; bp 110/80, cbg 81, sats 100% 3L; chronic pain in back, feet , hands; endorses bed sore on bottom; denies fevers, cough, denies urinary symptoms

## 2024-02-29 ENCOUNTER — Encounter (HOSPITAL_COMMUNITY): Payer: Self-pay

## 2024-02-29 LAB — SARS CORONAVIRUS 2 BY RT PCR: SARS Coronavirus 2 by RT PCR: NEGATIVE

## 2024-02-29 MED ORDER — PREGABALIN 50 MG PO CAPS: 50.0000 mg | ORAL_CAPSULE | Freq: Every day | ORAL | 0 refills | Status: AC

## 2024-02-29 MED ORDER — POTASSIUM CHLORIDE CRYS ER 20 MEQ PO TBCR
40.0000 meq | EXTENDED_RELEASE_TABLET | Freq: Once | ORAL | Status: AC
  Administered 2024-02-29: 40 meq via ORAL
  Filled 2024-02-29: qty 2

## 2024-02-29 MED ORDER — ACETAMINOPHEN 325 MG PO TABS
650.0000 mg | ORAL_TABLET | Freq: Once | ORAL | Status: AC
  Administered 2024-02-29: 650 mg via ORAL
  Filled 2024-02-29: qty 2

## 2024-02-29 NOTE — ED Provider Notes (Signed)
 Patient has decided to go home.  Home health PT ordered.  Son-in-law's at the bedside to take the patient home at this time.  Will have her follow-up with her primary doctor in several days as well.   Ninetta Basket, MD 02/29/24 2009

## 2024-02-29 NOTE — Discharge Instructions (Signed)
 You were seen for your symptoms in the emergency department.   At home, please stay well hydrated.    Check your MyChart online for the results of any tests that had not resulted by the time you left the emergency department.   Follow-up with your primary doctor in 2-3 days regarding your visit.    Return immediately to the emergency department if you experience any of the following: difficulty breathing, or any other concerning symptoms.    Thank you for visiting our Emergency Department. It was a pleasure taking care of you today.

## 2024-02-29 NOTE — Evaluation (Signed)
 Physical Therapy Evaluation Patient Details Name: Kim [name] MRN: [id number] DOB: 07/07/1961 Today's Date: 02/29/2024  History of Present Illness  Patient is a 63 y/o female seen in ED 02/28/24 due to weakness and SOB.  PMH positive for COPD (occasional O2 use), HTN, high cholesterol and ongoing tobacco use.  Clinical Impression  Patient presents with decreased mobility due to pain, decreased activity tolerance, decreased balance and poor safety awareness.  She was mobilizing to [facility] at home and with RW vs. Rollator denies recent falls, though family helping with sponge bathing and at times with dressing.  Today able to move to [facility] with CGA and to ambulate short distance to hallway and back with RW and CGA.  She is hopeful for home and feel with current level of assist is appropriate for home with family support and follow up HHPT.  PT will continue to follow during acute stay.      HR 101, SpO2 92% after short distance ambulation on RA.     If plan is discharge home, recommend the following: A little help with walking and/or transfers;A little help with bathing/dressing/bathroom;Assistance with cooking/housework;Assist for transportation;Help with stairs or ramp for entrance   Can travel by private vehicle        Equipment Recommendations Wheelchair (measurements PT) (transport wheelchair)  Recommendations for Other Services       Functional Status Assessment Patient has had a recent decline in their functional status and demonstrates the ability to make significant improvements in function in a reasonable and predictable amount of time.     Precautions / Restrictions Precautions Precautions: Fall Precaution/Restrictions Comments: denies falls at home      Mobility  Bed Mobility Overal bed mobility: Modified Independent, Needs Assistance Bed Mobility: Supine to Sit, Sit to Supine     Supine to sit: Min assist Sit to supine: Supervision   General bed mobility comments:  some lifting help to sit up; S for positioning to supine due to painful on buttocks    Transfers Overall transfer level: Needs assistance Equipment used: Rolling walker (2 wheels) Transfers: Sit to/from Stand, Bed to chair/wheelchair/BSC Sit to Stand: Contact guard assist   Step pivot transfers: Contact guard assist       General transfer comment: to [facility] initially with A for balance    Ambulation/Gait Ambulation/Gait assistance: Contact guard assist Gait Distance (Feet): 40 Feet Assistive device: Rolling walker (2 wheels) Gait Pattern/deviations: Step-to pattern, Step-through pattern, Decreased stride length, Shuffle, Trunk flexed       General Gait Details: slow pace, decreased foot clearance bilaterally, assist for balance; self limited reporting foot pain  Stairs            Wheelchair Mobility     Tilt Bed    Modified Rankin (Stroke Patients Only)       Balance                                             Pertinent Vitals/Pain Pain Assessment Pain Assessment: Faces Faces Pain Scale: Hurts whole lot Pain Location: bottom when on it, hands and feet from neuropathy Pain Descriptors / Indicators: Aching, Sore, Pins and needles Pain Intervention(s): Monitored during session, Repositioned    Home Living Family/patient expects to be discharged to:: Private residence Living Arrangements: Spouse/significant other;Children (daughter) Available Help at Discharge: Available 24 hours/day;Family Type of Home: House Home Access: Stairs  to enter Entrance Stairs-Rails: Can reach both Entrance Stairs-Number of Steps: 4   Home Layout: One level Home Equipment: BSC/3in1;Rolling Walker (2 wheels);Rollator (4 wheels)      Prior Function Prior Level of Function : Needs assist             Mobility Comments: rollator for mobility, limited household ambulator; uses BSC on her own at home ADLs Comments: help for sponge bathing and occasional help  for dressing     Extremity/Trunk Assessment   Upper Extremity Assessment Upper Extremity Assessment: Generalized weakness (neuopathy in hands)    Lower Extremity Assessment Lower Extremity Assessment: Generalized weakness (neuropathy in feet)    Cervical / Trunk Assessment Cervical / Trunk Assessment: Kyphotic  Communication   Communication Communication: No apparent difficulties    Cognition Arousal: Alert Behavior During Therapy: Anxious   PT - Cognitive impairments: No apparent impairments                         Following commands: Intact       Cueing       General Comments      Exercises     Assessment/Plan    PT Assessment Patient needs continued PT services  PT Problem List Decreased strength;Decreased mobility;Decreased activity tolerance;Decreased balance;Decreased safety awareness       PT Treatment Interventions DME instruction;Therapeutic exercise;Gait training;Balance training;Stair training;Functional mobility training;Therapeutic activities;Patient/family education    PT Goals (Current goals can be found in the Care Plan section)  Acute Rehab PT Goals Patient Stated Goal: to go home PT Goal Formulation: With patient Time For Goal Achievement: 03/14/24 Potential to Achieve Goals: Fair    Frequency Min 2X/week     Co-evaluation               AM-PAC PT "6 Clicks" Mobility  Outcome Measure Help needed turning from your back to your side while in a flat bed without using bedrails?: A Little Help needed moving from lying on your back to sitting on the side of a flat bed without using bedrails?: A Little Help needed moving to and from a bed to a chair (including a wheelchair)?: A Little Help needed standing up from a chair using your arms (e.g., wheelchair or bedside chair)?: A Little Help needed to walk in hospital room?: A Little Help needed climbing 3-5 steps with a railing? : Total 6 Click Score: 16    End of Session  Equipment Utilized During Treatment: Gait belt Activity Tolerance: Patient limited by fatigue;Patient limited by pain Patient left: in bed;with call bell/phone within reach   PT Visit Diagnosis: Muscle weakness (generalized) (M62.81);Other abnormalities of gait and mobility (R26.89)    Time: 0925-1010 PT Time Calculation (min) (ACUTE ONLY): 45 min   Charges:   PT Evaluation $PT Eval Moderate Complexity: 1 Mod PT Treatments $Gait Training: 8-22 mins PT General Charges $$ ACUTE PT VISIT: 1 Visit         Abigail Hoff, PT Acute Rehabilitation Services Office:[phone] 02/29/2024   Marley Simmers 02/29/2024, 10:33 AM

## 2024-02-29 NOTE — ED Notes (Signed)
 Pt awake alert, not in any acute distress, pending TOC

## 2024-02-29 NOTE — Progress Notes (Addendum)
 12:53pm: CSW spoke with Artavia at [facility] to inform her of discharge plan. [facility] will resume home health services after discharge.  10:35am: PT has recommended home health services for patient at discharge.  7:55am: CSW received consult for placement for possible SNF placement.  CSW sent message to MD to request PT order be placed.  CSW will wait for PT recommendations prior to proceeding with discharge planning.  Shepard Dicker, MSW, LCSW Transitions of Care  Clinical Social Worker II [phone]

## 2024-02-29 NOTE — ED Provider Notes (Signed)
  Physical Exam  BP 110/67   Pulse 80   Temp 98.7 F (37.1 C) (Oral)   Resp 19   Ht 5\' 5"  (1.651 m)   SpO2 98%   BMI 28.29 kg/m   Physical Exam  Procedures  Procedures  ED Course / MDM   Clinical Course as of 02/29/24 0340  Wed Feb 28, 2024  2005 Stable SOB COPD 3L at baseline  Weakness is aoc [CC]  2247 TOC [CC]    Clinical Course User Index [CC] Onetha Bile, MD   Medical Decision Making Amount and/or Complexity of Data Reviewed Labs: ordered. Radiology: ordered.  Risk OTC drugs.   Patient care assumed at shift handoff from previous provider.  Please see her note for full details.  In short, 63 year old female patient with past medical history significant for COPD, hypertension, high cholesterol presents the emergency department complaining of "a few days" of shortness of breath, weakness.  Medical workup was complete and was grossly unremarkable.  Patient is  requesting assistance with placement into a facility due to deconditioning.  TOC consult placed.  Final disposition pending TOC consult/recommendations. Patient care transferred to Melda Spore, PA-C at shift handoff.        Delories Fetter 02/29/24 1610    Alissa April, MD 02/29/24 [phone]

## 2024-03-06 ENCOUNTER — Telehealth: Payer: Self-pay | Admitting: *Deleted

## 2024-03-06 NOTE — Telephone Encounter (Signed)
 Called patient regarding getting her mammogram scheduled /patient she can not go because she do not have a wheelchair. Ms. Kim [name] is calling the patient to make her an appointment to come in for wheelchair eval. Thus far she has not kept about 4 appointments.

## 2024-03-11 ENCOUNTER — Encounter: Admitting: Student

## 2024-03-11 DIAGNOSIS — Z419 Encounter for procedure for purposes other than remedying health state, unspecified: Secondary | ICD-10-CM | POA: Diagnosis not present

## 2024-03-11 NOTE — Progress Notes (Deleted)
 CC: Wheelchair evaluation  HPI:  [name] is a 63 y.o. female with a past medical history of hypertension, COPD, CKD stage III who presents for wheelchair evaluation.  Please see assessment and plan for full HPI.  Past Medical History:  Diagnosis Date   COPD (chronic obstructive pulmonary disease) (HCC)    H. pylori infection    High cholesterol    Hypertension    Smoker      Current Outpatient Medications:    acetaminophen  (TYLENOL ) 500 MG tablet, Take 500 mg by mouth 3 (three) times daily as needed (pain)., Disp: , Rfl:    albuterol  (VENTOLIN  HFA) 108 (90 Base) MCG/ACT inhaler, Inhale 2 puffs into the lungs every 4 (four) hours as needed for wheezing or shortness of breath., Disp: 18 g, Rfl: 2   [Paused] amLODipine  (NORVASC ) 5 MG tablet, Take 1 tablet (5 mg total) by mouth daily. (Patient not taking: Reported on 12/01/2023), Disp: 30 tablet, Rfl: 1   capsaicin  (ZOSTRIX) 0.025 % cream, Apply topically as needed (apply feet for neuropathy)., Disp: , Rfl:    cephALEXin  (KEFLEX ) 500 MG capsule, Take 1 capsule (500 mg total) by mouth 4 (four) times daily., Disp: 28 capsule, Rfl: 0   metoCLOPramide  (REGLAN ) 10 MG tablet, Take 0.5-1 tablets (5-10 mg total) by mouth 3 (three) times daily before meals., Disp: 30 tablet, Rfl: 1   Multiple Vitamin (MULTIVITAMIN WITH MINERALS) TABS tablet, Take 1 tablet by mouth daily., Disp: , Rfl:    Nutritional Supplements (ENSURE COMPLETE SHAKE) LIQD, Take 1 each by mouth in the morning and at bedtime., Disp: 237 mL, Rfl: 2   ondansetron  (ZOFRAN -ODT) 4 MG disintegrating tablet, Take 1 tablet (4 mg total) by mouth every 6 (six) hours., Disp: 120 tablet, Rfl: 1   OXYGEN , Inhale 3 L into the lungs as needed., Disp: , Rfl:    pantoprazole  (PROTONIX ) 40 MG tablet, Take 1 tablet (40 mg total) by mouth 2 (two) times daily for 8 days., Disp: , Rfl:    pantoprazole  (PROTONIX ) 40 MG tablet, Take 1 tablet (40 mg total) by mouth daily. Start 1 tablet daily on  2/21 (after completed twice daily treatment for H.pylori), Disp: , Rfl:    pravastatin  (PRAVACHOL ) 20 MG tablet, TAKE 1 TABLET BY MOUTH EVERYDAY AT BEDTIME (Patient taking differently: Take 20 mg by mouth at bedtime.), Disp: 90 tablet, Rfl: 0   pregabalin  (LYRICA ) 50 MG capsule, Take 1 capsule (50 mg total) by mouth at bedtime., Disp: 30 capsule, Rfl: 0   senna-docusate (SENOKOT-S) 8.6-50 MG tablet, Take 1 tablet by mouth at bedtime as needed for mild constipation., Disp: 2 tablet, Rfl: 0   triamcinolone  (KENALOG ) 0.025 % ointment, Apply 1 Application topically 2 (two) times daily as needed (Itching)., Disp: 30 g, Rfl: 0  Review of Systems:  ***  Constitutional: Eye: Respiratory: Cardiovascular: GI: MSK: GU: Skin: Neuro: Endocrine:   Physical Exam:  There were no vitals filed for this visit. *** General: Patient is sitting comfortably in the room  Eyes: Pupils equal and reactive to light, EOM intact  Head: Normocephalic, atraumatic  Neck: Supple, nontender, full range of motion, No JVD Cardio: Regular rate and rhythm, no murmurs, rubs or gallops. 2+ pulses to bilateral upper and lower extremities  Chest: No chest tenderness Pulmonary: Clear to ausculation bilaterally with no rales, rhonchi, and crackles  Abdomen: Soft, nontender with normoactive bowel sounds with no rebound or guarding  Neuro: Alert and orientated x3. CN II-XII intact. Sensation intact to  upper and lower extremities. 2+ patellar reflex.  Back: No midline tenderness, no step off or deformities noted. No paraspinal muscle tenderness.  Skin: No rashes noted  MSK: 5/5 strength to upper and lower extremities.    Assessment & Plan:   No problem-specific Assessment & Plan notes found for this encounter.    Patient {GC/GE:[id number]::"discussed with","seen with"} Dr. {NAMES:[id number]::"Guilloud","Hoffman","Mullen","Narendra","Williams","Vincent"}  Jonelle Neri, DO PGY-2 Internal Medicine Resident

## 2024-03-15 DIAGNOSIS — I499 Cardiac arrhythmia, unspecified: Secondary | ICD-10-CM | POA: Diagnosis not present

## 2024-03-15 DIAGNOSIS — R739 Hyperglycemia, unspecified: Secondary | ICD-10-CM | POA: Diagnosis not present

## 2024-03-15 DIAGNOSIS — R531 Weakness: Secondary | ICD-10-CM | POA: Diagnosis not present

## 2024-03-15 DIAGNOSIS — N183 Chronic kidney disease, stage 3 unspecified: Secondary | ICD-10-CM | POA: Diagnosis not present

## 2024-03-15 DIAGNOSIS — I959 Hypotension, unspecified: Secondary | ICD-10-CM | POA: Diagnosis not present

## 2024-03-15 DIAGNOSIS — R109 Unspecified abdominal pain: Secondary | ICD-10-CM | POA: Diagnosis not present

## 2024-03-15 DIAGNOSIS — Z9981 Dependence on supplemental oxygen: Secondary | ICD-10-CM | POA: Diagnosis not present

## 2024-03-15 DIAGNOSIS — J449 Chronic obstructive pulmonary disease, unspecified: Secondary | ICD-10-CM | POA: Diagnosis not present

## 2024-03-15 DIAGNOSIS — G629 Polyneuropathy, unspecified: Secondary | ICD-10-CM | POA: Diagnosis not present

## 2024-03-15 DIAGNOSIS — N390 Urinary tract infection, site not specified: Secondary | ICD-10-CM | POA: Diagnosis not present

## 2024-03-15 DIAGNOSIS — R627 Adult failure to thrive: Secondary | ICD-10-CM | POA: Diagnosis not present

## 2024-03-15 DIAGNOSIS — R000 Tachycardia, unspecified: Secondary | ICD-10-CM | POA: Diagnosis not present

## 2024-03-15 DIAGNOSIS — I129 Hypertensive chronic kidney disease with stage 1 through stage 4 chronic kidney disease, or unspecified chronic kidney disease: Secondary | ICD-10-CM | POA: Diagnosis not present

## 2024-03-15 DIAGNOSIS — G4733 Obstructive sleep apnea (adult) (pediatric): Secondary | ICD-10-CM | POA: Diagnosis not present

## 2024-03-15 DIAGNOSIS — R4182 Altered mental status, unspecified: Secondary | ICD-10-CM | POA: Diagnosis not present

## 2024-03-15 DIAGNOSIS — K828 Other specified diseases of gallbladder: Secondary | ICD-10-CM | POA: Diagnosis not present

## 2024-03-15 DIAGNOSIS — I7143 Infrarenal abdominal aortic aneurysm, without rupture: Secondary | ICD-10-CM | POA: Diagnosis not present

## 2024-03-15 DIAGNOSIS — Z743 Need for continuous supervision: Secondary | ICD-10-CM | POA: Diagnosis not present

## 2024-03-15 DIAGNOSIS — E43 Unspecified severe protein-calorie malnutrition: Secondary | ICD-10-CM | POA: Diagnosis not present

## 2024-03-15 DIAGNOSIS — E871 Hypo-osmolality and hyponatremia: Secondary | ICD-10-CM | POA: Diagnosis not present

## 2024-03-16 DIAGNOSIS — N390 Urinary tract infection, site not specified: Secondary | ICD-10-CM | POA: Diagnosis not present

## 2024-03-16 DIAGNOSIS — Z8619 Personal history of other infectious and parasitic diseases: Secondary | ICD-10-CM | POA: Diagnosis not present

## 2024-03-16 DIAGNOSIS — R112 Nausea with vomiting, unspecified: Secondary | ICD-10-CM | POA: Diagnosis not present

## 2024-03-16 DIAGNOSIS — R638 Other symptoms and signs concerning food and fluid intake: Secondary | ICD-10-CM | POA: Diagnosis not present

## 2024-03-16 DIAGNOSIS — R627 Adult failure to thrive: Secondary | ICD-10-CM | POA: Diagnosis not present

## 2024-03-18 DIAGNOSIS — I10 Essential (primary) hypertension: Secondary | ICD-10-CM | POA: Diagnosis not present

## 2024-03-18 DIAGNOSIS — K2950 Unspecified chronic gastritis without bleeding: Secondary | ICD-10-CM | POA: Diagnosis not present

## 2024-03-18 DIAGNOSIS — R112 Nausea with vomiting, unspecified: Secondary | ICD-10-CM | POA: Diagnosis not present

## 2024-03-18 DIAGNOSIS — R627 Adult failure to thrive: Secondary | ICD-10-CM | POA: Diagnosis not present

## 2024-03-18 DIAGNOSIS — N390 Urinary tract infection, site not specified: Secondary | ICD-10-CM | POA: Diagnosis not present

## 2024-03-18 DIAGNOSIS — G4733 Obstructive sleep apnea (adult) (pediatric): Secondary | ICD-10-CM | POA: Diagnosis not present

## 2024-03-18 DIAGNOSIS — J449 Chronic obstructive pulmonary disease, unspecified: Secondary | ICD-10-CM | POA: Diagnosis not present

## 2024-03-19 DIAGNOSIS — N390 Urinary tract infection, site not specified: Secondary | ICD-10-CM | POA: Diagnosis not present

## 2024-03-19 DIAGNOSIS — E871 Hypo-osmolality and hyponatremia: Secondary | ICD-10-CM | POA: Diagnosis not present

## 2024-03-19 DIAGNOSIS — K828 Other specified diseases of gallbladder: Secondary | ICD-10-CM | POA: Diagnosis not present

## 2024-03-19 DIAGNOSIS — E43 Unspecified severe protein-calorie malnutrition: Secondary | ICD-10-CM | POA: Diagnosis not present

## 2024-03-19 DIAGNOSIS — R627 Adult failure to thrive: Secondary | ICD-10-CM | POA: Diagnosis not present

## 2024-03-19 DIAGNOSIS — R112 Nausea with vomiting, unspecified: Secondary | ICD-10-CM | POA: Diagnosis not present

## 2024-03-20 DIAGNOSIS — R109 Unspecified abdominal pain: Secondary | ICD-10-CM | POA: Diagnosis not present

## 2024-03-20 DIAGNOSIS — K5900 Constipation, unspecified: Secondary | ICD-10-CM | POA: Diagnosis not present

## 2024-03-20 DIAGNOSIS — R627 Adult failure to thrive: Secondary | ICD-10-CM | POA: Diagnosis not present

## 2024-03-20 DIAGNOSIS — N390 Urinary tract infection, site not specified: Secondary | ICD-10-CM | POA: Diagnosis not present

## 2024-03-20 DIAGNOSIS — R110 Nausea: Secondary | ICD-10-CM | POA: Diagnosis not present

## 2024-03-21 DIAGNOSIS — R110 Nausea: Secondary | ICD-10-CM | POA: Diagnosis not present

## 2024-03-21 DIAGNOSIS — R112 Nausea with vomiting, unspecified: Secondary | ICD-10-CM | POA: Diagnosis not present

## 2024-03-21 DIAGNOSIS — K5900 Constipation, unspecified: Secondary | ICD-10-CM | POA: Diagnosis not present

## 2024-03-21 DIAGNOSIS — R109 Unspecified abdominal pain: Secondary | ICD-10-CM | POA: Diagnosis not present

## 2024-03-21 DIAGNOSIS — N390 Urinary tract infection, site not specified: Secondary | ICD-10-CM | POA: Diagnosis not present

## 2024-03-21 DIAGNOSIS — R627 Adult failure to thrive: Secondary | ICD-10-CM | POA: Diagnosis not present

## 2024-03-22 DIAGNOSIS — I10 Essential (primary) hypertension: Secondary | ICD-10-CM | POA: Diagnosis not present

## 2024-03-22 DIAGNOSIS — R627 Adult failure to thrive: Secondary | ICD-10-CM | POA: Diagnosis not present

## 2024-03-22 DIAGNOSIS — N390 Urinary tract infection, site not specified: Secondary | ICD-10-CM | POA: Diagnosis not present

## 2024-03-22 DIAGNOSIS — J449 Chronic obstructive pulmonary disease, unspecified: Secondary | ICD-10-CM | POA: Diagnosis not present

## 2024-03-22 DIAGNOSIS — K5900 Constipation, unspecified: Secondary | ICD-10-CM | POA: Diagnosis not present

## 2024-03-22 DIAGNOSIS — R110 Nausea: Secondary | ICD-10-CM | POA: Diagnosis not present

## 2024-03-22 DIAGNOSIS — K828 Other specified diseases of gallbladder: Secondary | ICD-10-CM | POA: Diagnosis not present

## 2024-03-22 DIAGNOSIS — N183 Chronic kidney disease, stage 3 unspecified: Secondary | ICD-10-CM | POA: Diagnosis not present

## 2024-03-23 DIAGNOSIS — K824 Cholesterolosis of gallbladder: Secondary | ICD-10-CM | POA: Diagnosis not present

## 2024-03-23 DIAGNOSIS — K828 Other specified diseases of gallbladder: Secondary | ICD-10-CM | POA: Diagnosis not present

## 2024-03-23 DIAGNOSIS — N390 Urinary tract infection, site not specified: Secondary | ICD-10-CM | POA: Diagnosis not present

## 2024-03-24 DIAGNOSIS — N390 Urinary tract infection, site not specified: Secondary | ICD-10-CM | POA: Diagnosis not present

## 2024-03-25 DIAGNOSIS — N390 Urinary tract infection, site not specified: Secondary | ICD-10-CM | POA: Diagnosis not present

## 2024-03-26 DIAGNOSIS — N183 Chronic kidney disease, stage 3 unspecified: Secondary | ICD-10-CM | POA: Diagnosis not present

## 2024-03-26 DIAGNOSIS — R627 Adult failure to thrive: Secondary | ICD-10-CM | POA: Diagnosis not present

## 2024-03-26 DIAGNOSIS — E876 Hypokalemia: Secondary | ICD-10-CM | POA: Diagnosis not present

## 2024-03-26 DIAGNOSIS — E871 Hypo-osmolality and hyponatremia: Secondary | ICD-10-CM | POA: Diagnosis not present

## 2024-03-26 DIAGNOSIS — Z9049 Acquired absence of other specified parts of digestive tract: Secondary | ICD-10-CM | POA: Diagnosis not present

## 2024-03-26 DIAGNOSIS — G629 Polyneuropathy, unspecified: Secondary | ICD-10-CM | POA: Diagnosis not present

## 2024-03-26 DIAGNOSIS — J449 Chronic obstructive pulmonary disease, unspecified: Secondary | ICD-10-CM | POA: Diagnosis not present

## 2024-03-26 DIAGNOSIS — E43 Unspecified severe protein-calorie malnutrition: Secondary | ICD-10-CM | POA: Diagnosis not present

## 2024-03-27 DIAGNOSIS — E43 Unspecified severe protein-calorie malnutrition: Secondary | ICD-10-CM | POA: Diagnosis not present

## 2024-03-27 DIAGNOSIS — K828 Other specified diseases of gallbladder: Secondary | ICD-10-CM | POA: Diagnosis not present

## 2024-03-27 DIAGNOSIS — Z9049 Acquired absence of other specified parts of digestive tract: Secondary | ICD-10-CM | POA: Diagnosis not present

## 2024-03-27 DIAGNOSIS — N390 Urinary tract infection, site not specified: Secondary | ICD-10-CM | POA: Diagnosis not present

## 2024-03-27 DIAGNOSIS — R627 Adult failure to thrive: Secondary | ICD-10-CM | POA: Diagnosis not present

## 2024-03-27 DIAGNOSIS — E876 Hypokalemia: Secondary | ICD-10-CM | POA: Diagnosis not present

## 2024-03-27 DIAGNOSIS — N183 Chronic kidney disease, stage 3 unspecified: Secondary | ICD-10-CM | POA: Diagnosis not present

## 2024-03-27 DIAGNOSIS — J449 Chronic obstructive pulmonary disease, unspecified: Secondary | ICD-10-CM | POA: Diagnosis not present

## 2024-03-27 DIAGNOSIS — E871 Hypo-osmolality and hyponatremia: Secondary | ICD-10-CM | POA: Diagnosis not present

## 2024-03-27 DIAGNOSIS — G629 Polyneuropathy, unspecified: Secondary | ICD-10-CM | POA: Diagnosis not present

## 2024-03-28 DIAGNOSIS — G629 Polyneuropathy, unspecified: Secondary | ICD-10-CM | POA: Diagnosis not present

## 2024-03-28 DIAGNOSIS — J449 Chronic obstructive pulmonary disease, unspecified: Secondary | ICD-10-CM | POA: Diagnosis not present

## 2024-03-28 DIAGNOSIS — E876 Hypokalemia: Secondary | ICD-10-CM | POA: Diagnosis not present

## 2024-03-28 DIAGNOSIS — N183 Chronic kidney disease, stage 3 unspecified: Secondary | ICD-10-CM | POA: Diagnosis not present

## 2024-03-28 DIAGNOSIS — R627 Adult failure to thrive: Secondary | ICD-10-CM | POA: Diagnosis not present

## 2024-03-28 DIAGNOSIS — E871 Hypo-osmolality and hyponatremia: Secondary | ICD-10-CM | POA: Diagnosis not present

## 2024-03-28 DIAGNOSIS — Z9049 Acquired absence of other specified parts of digestive tract: Secondary | ICD-10-CM | POA: Diagnosis not present

## 2024-03-28 DIAGNOSIS — E43 Unspecified severe protein-calorie malnutrition: Secondary | ICD-10-CM | POA: Diagnosis not present

## 2024-03-28 DIAGNOSIS — M6281 Muscle weakness (generalized): Secondary | ICD-10-CM | POA: Diagnosis not present

## 2024-03-31 DIAGNOSIS — J449 Chronic obstructive pulmonary disease, unspecified: Secondary | ICD-10-CM | POA: Diagnosis not present

## 2024-03-31 DIAGNOSIS — N183 Chronic kidney disease, stage 3 unspecified: Secondary | ICD-10-CM | POA: Diagnosis not present

## 2024-03-31 DIAGNOSIS — G4733 Obstructive sleep apnea (adult) (pediatric): Secondary | ICD-10-CM | POA: Diagnosis not present

## 2024-03-31 DIAGNOSIS — G629 Polyneuropathy, unspecified: Secondary | ICD-10-CM | POA: Diagnosis not present

## 2024-03-31 DIAGNOSIS — E43 Unspecified severe protein-calorie malnutrition: Secondary | ICD-10-CM | POA: Diagnosis not present

## 2024-03-31 DIAGNOSIS — E876 Hypokalemia: Secondary | ICD-10-CM | POA: Diagnosis not present

## 2024-03-31 DIAGNOSIS — E871 Hypo-osmolality and hyponatremia: Secondary | ICD-10-CM | POA: Diagnosis not present

## 2024-03-31 DIAGNOSIS — R627 Adult failure to thrive: Secondary | ICD-10-CM | POA: Diagnosis not present

## 2024-03-31 DIAGNOSIS — F17210 Nicotine dependence, cigarettes, uncomplicated: Secondary | ICD-10-CM | POA: Diagnosis not present

## 2024-03-31 DIAGNOSIS — I129 Hypertensive chronic kidney disease with stage 1 through stage 4 chronic kidney disease, or unspecified chronic kidney disease: Secondary | ICD-10-CM | POA: Diagnosis not present

## 2024-03-31 DIAGNOSIS — B9681 Helicobacter pylori [H. pylori] as the cause of diseases classified elsewhere: Secondary | ICD-10-CM | POA: Diagnosis not present

## 2024-03-31 DIAGNOSIS — N390 Urinary tract infection, site not specified: Secondary | ICD-10-CM | POA: Diagnosis not present

## 2024-04-01 DIAGNOSIS — R627 Adult failure to thrive: Secondary | ICD-10-CM | POA: Diagnosis not present

## 2024-04-01 DIAGNOSIS — E871 Hypo-osmolality and hyponatremia: Secondary | ICD-10-CM | POA: Diagnosis not present

## 2024-04-01 DIAGNOSIS — G629 Polyneuropathy, unspecified: Secondary | ICD-10-CM | POA: Diagnosis not present

## 2024-04-01 DIAGNOSIS — E876 Hypokalemia: Secondary | ICD-10-CM | POA: Diagnosis not present

## 2024-04-01 DIAGNOSIS — E43 Unspecified severe protein-calorie malnutrition: Secondary | ICD-10-CM | POA: Diagnosis not present

## 2024-04-01 DIAGNOSIS — Z9049 Acquired absence of other specified parts of digestive tract: Secondary | ICD-10-CM | POA: Diagnosis not present

## 2024-04-01 DIAGNOSIS — J449 Chronic obstructive pulmonary disease, unspecified: Secondary | ICD-10-CM | POA: Diagnosis not present

## 2024-04-01 DIAGNOSIS — R400 Somnolence: Secondary | ICD-10-CM | POA: Diagnosis not present

## 2024-04-01 DIAGNOSIS — M6281 Muscle weakness (generalized): Secondary | ICD-10-CM | POA: Diagnosis not present

## 2024-04-02 DIAGNOSIS — M6281 Muscle weakness (generalized): Secondary | ICD-10-CM | POA: Diagnosis not present

## 2024-04-02 DIAGNOSIS — E871 Hypo-osmolality and hyponatremia: Secondary | ICD-10-CM | POA: Diagnosis not present

## 2024-04-02 DIAGNOSIS — E43 Unspecified severe protein-calorie malnutrition: Secondary | ICD-10-CM | POA: Diagnosis not present

## 2024-04-02 DIAGNOSIS — G629 Polyneuropathy, unspecified: Secondary | ICD-10-CM | POA: Diagnosis not present

## 2024-04-02 DIAGNOSIS — R627 Adult failure to thrive: Secondary | ICD-10-CM | POA: Diagnosis not present

## 2024-04-02 DIAGNOSIS — E876 Hypokalemia: Secondary | ICD-10-CM | POA: Diagnosis not present

## 2024-04-02 DIAGNOSIS — J449 Chronic obstructive pulmonary disease, unspecified: Secondary | ICD-10-CM | POA: Diagnosis not present

## 2024-04-02 DIAGNOSIS — R400 Somnolence: Secondary | ICD-10-CM | POA: Diagnosis not present

## 2024-04-02 DIAGNOSIS — Z9049 Acquired absence of other specified parts of digestive tract: Secondary | ICD-10-CM | POA: Diagnosis not present

## 2024-04-03 DIAGNOSIS — Z1612 Extended spectrum beta lactamase (ESBL) resistance: Secondary | ICD-10-CM | POA: Diagnosis not present

## 2024-04-03 DIAGNOSIS — E870 Hyperosmolality and hypernatremia: Secondary | ICD-10-CM | POA: Diagnosis not present

## 2024-04-03 DIAGNOSIS — Z743 Need for continuous supervision: Secondary | ICD-10-CM | POA: Diagnosis not present

## 2024-04-03 DIAGNOSIS — G928 Other toxic encephalopathy: Secondary | ICD-10-CM | POA: Diagnosis not present

## 2024-04-03 DIAGNOSIS — M25511 Pain in right shoulder: Secondary | ICD-10-CM | POA: Diagnosis not present

## 2024-04-03 DIAGNOSIS — N179 Acute kidney failure, unspecified: Secondary | ICD-10-CM | POA: Diagnosis not present

## 2024-04-03 DIAGNOSIS — R0602 Shortness of breath: Secondary | ICD-10-CM | POA: Diagnosis not present

## 2024-04-03 DIAGNOSIS — R627 Adult failure to thrive: Secondary | ICD-10-CM | POA: Diagnosis not present

## 2024-04-03 DIAGNOSIS — K5732 Diverticulitis of large intestine without perforation or abscess without bleeding: Secondary | ICD-10-CM | POA: Diagnosis not present

## 2024-04-03 DIAGNOSIS — R4182 Altered mental status, unspecified: Secondary | ICD-10-CM | POA: Diagnosis not present

## 2024-04-03 DIAGNOSIS — I959 Hypotension, unspecified: Secondary | ICD-10-CM | POA: Diagnosis not present

## 2024-04-03 DIAGNOSIS — I10 Essential (primary) hypertension: Secondary | ICD-10-CM | POA: Diagnosis not present

## 2024-04-03 DIAGNOSIS — N390 Urinary tract infection, site not specified: Secondary | ICD-10-CM | POA: Diagnosis not present

## 2024-04-03 DIAGNOSIS — D61818 Other pancytopenia: Secondary | ICD-10-CM | POA: Diagnosis not present

## 2024-04-03 DIAGNOSIS — K5730 Diverticulosis of large intestine without perforation or abscess without bleeding: Secondary | ICD-10-CM | POA: Diagnosis not present

## 2024-04-03 DIAGNOSIS — E43 Unspecified severe protein-calorie malnutrition: Secondary | ICD-10-CM | POA: Diagnosis not present

## 2024-04-03 DIAGNOSIS — A4151 Sepsis due to Escherichia coli [E. coli]: Secondary | ICD-10-CM | POA: Diagnosis not present

## 2024-04-03 DIAGNOSIS — L89153 Pressure ulcer of sacral region, stage 3: Secondary | ICD-10-CM | POA: Diagnosis not present

## 2024-04-03 DIAGNOSIS — E871 Hypo-osmolality and hyponatremia: Secondary | ICD-10-CM | POA: Diagnosis not present

## 2024-04-03 DIAGNOSIS — D62 Acute posthemorrhagic anemia: Secondary | ICD-10-CM | POA: Diagnosis not present

## 2024-04-03 DIAGNOSIS — E512 Wernicke's encephalopathy: Secondary | ICD-10-CM | POA: Diagnosis not present

## 2024-04-03 DIAGNOSIS — R531 Weakness: Secondary | ICD-10-CM | POA: Diagnosis not present

## 2024-04-03 DIAGNOSIS — I7143 Infrarenal abdominal aortic aneurysm, without rupture: Secondary | ICD-10-CM | POA: Diagnosis not present

## 2024-04-03 DIAGNOSIS — K6389 Other specified diseases of intestine: Secondary | ICD-10-CM | POA: Diagnosis not present

## 2024-04-04 DIAGNOSIS — Z452 Encounter for adjustment and management of vascular access device: Secondary | ICD-10-CM | POA: Diagnosis not present

## 2024-04-04 DIAGNOSIS — I808 Phlebitis and thrombophlebitis of other sites: Secondary | ICD-10-CM | POA: Diagnosis not present

## 2024-04-07 DIAGNOSIS — R9082 White matter disease, unspecified: Secondary | ICD-10-CM | POA: Diagnosis not present

## 2024-04-07 DIAGNOSIS — R627 Adult failure to thrive: Secondary | ICD-10-CM | POA: Diagnosis not present

## 2024-04-07 DIAGNOSIS — E43 Unspecified severe protein-calorie malnutrition: Secondary | ICD-10-CM | POA: Diagnosis not present

## 2024-04-07 DIAGNOSIS — I10 Essential (primary) hypertension: Secondary | ICD-10-CM | POA: Diagnosis not present

## 2024-04-07 DIAGNOSIS — N390 Urinary tract infection, site not specified: Secondary | ICD-10-CM | POA: Diagnosis not present

## 2024-04-07 DIAGNOSIS — N179 Acute kidney failure, unspecified: Secondary | ICD-10-CM | POA: Diagnosis not present

## 2024-04-07 DIAGNOSIS — R531 Weakness: Secondary | ICD-10-CM | POA: Diagnosis not present

## 2024-04-07 DIAGNOSIS — R4182 Altered mental status, unspecified: Secondary | ICD-10-CM | POA: Diagnosis not present

## 2024-04-08 DIAGNOSIS — I10 Essential (primary) hypertension: Secondary | ICD-10-CM | POA: Diagnosis not present

## 2024-04-08 DIAGNOSIS — Z4682 Encounter for fitting and adjustment of non-vascular catheter: Secondary | ICD-10-CM | POA: Diagnosis not present

## 2024-04-08 DIAGNOSIS — E43 Unspecified severe protein-calorie malnutrition: Secondary | ICD-10-CM | POA: Diagnosis not present

## 2024-04-08 DIAGNOSIS — N390 Urinary tract infection, site not specified: Secondary | ICD-10-CM | POA: Diagnosis not present

## 2024-04-08 DIAGNOSIS — N179 Acute kidney failure, unspecified: Secondary | ICD-10-CM | POA: Diagnosis not present

## 2024-04-08 DIAGNOSIS — R627 Adult failure to thrive: Secondary | ICD-10-CM | POA: Diagnosis not present

## 2024-04-09 DIAGNOSIS — R1310 Dysphagia, unspecified: Secondary | ICD-10-CM | POA: Diagnosis not present

## 2024-04-09 DIAGNOSIS — K5730 Diverticulosis of large intestine without perforation or abscess without bleeding: Secondary | ICD-10-CM | POA: Diagnosis not present

## 2024-04-09 DIAGNOSIS — N2889 Other specified disorders of kidney and ureter: Secondary | ICD-10-CM | POA: Diagnosis not present

## 2024-04-09 DIAGNOSIS — N179 Acute kidney failure, unspecified: Secondary | ICD-10-CM | POA: Diagnosis not present

## 2024-04-09 DIAGNOSIS — I10 Essential (primary) hypertension: Secondary | ICD-10-CM | POA: Diagnosis not present

## 2024-04-09 DIAGNOSIS — N183 Chronic kidney disease, stage 3 unspecified: Secondary | ICD-10-CM | POA: Diagnosis not present

## 2024-04-09 DIAGNOSIS — N390 Urinary tract infection, site not specified: Secondary | ICD-10-CM | POA: Diagnosis not present

## 2024-04-09 DIAGNOSIS — R627 Adult failure to thrive: Secondary | ICD-10-CM | POA: Diagnosis not present

## 2024-04-09 DIAGNOSIS — I700 Atherosclerosis of aorta: Secondary | ICD-10-CM | POA: Diagnosis not present

## 2024-04-09 DIAGNOSIS — E512 Wernicke's encephalopathy: Secondary | ICD-10-CM | POA: Diagnosis not present

## 2024-04-09 DIAGNOSIS — E871 Hypo-osmolality and hyponatremia: Secondary | ICD-10-CM | POA: Diagnosis not present

## 2024-04-09 DIAGNOSIS — E43 Unspecified severe protein-calorie malnutrition: Secondary | ICD-10-CM | POA: Diagnosis not present

## 2024-04-09 DIAGNOSIS — K5900 Constipation, unspecified: Secondary | ICD-10-CM | POA: Diagnosis not present

## 2024-04-09 DIAGNOSIS — Z4682 Encounter for fitting and adjustment of non-vascular catheter: Secondary | ICD-10-CM | POA: Diagnosis not present

## 2024-04-10 DIAGNOSIS — I10 Essential (primary) hypertension: Secondary | ICD-10-CM | POA: Diagnosis not present

## 2024-04-10 DIAGNOSIS — R627 Adult failure to thrive: Secondary | ICD-10-CM | POA: Diagnosis not present

## 2024-04-10 DIAGNOSIS — E512 Wernicke's encephalopathy: Secondary | ICD-10-CM | POA: Diagnosis not present

## 2024-04-10 DIAGNOSIS — N183 Chronic kidney disease, stage 3 unspecified: Secondary | ICD-10-CM | POA: Diagnosis not present

## 2024-04-10 DIAGNOSIS — N390 Urinary tract infection, site not specified: Secondary | ICD-10-CM | POA: Diagnosis not present

## 2024-04-10 DIAGNOSIS — K5900 Constipation, unspecified: Secondary | ICD-10-CM | POA: Diagnosis not present

## 2024-04-10 DIAGNOSIS — E43 Unspecified severe protein-calorie malnutrition: Secondary | ICD-10-CM | POA: Diagnosis not present

## 2024-04-10 DIAGNOSIS — R1310 Dysphagia, unspecified: Secondary | ICD-10-CM | POA: Diagnosis not present

## 2024-04-10 DIAGNOSIS — N179 Acute kidney failure, unspecified: Secondary | ICD-10-CM | POA: Diagnosis not present

## 2024-04-10 DIAGNOSIS — I700 Atherosclerosis of aorta: Secondary | ICD-10-CM | POA: Diagnosis not present

## 2024-04-10 DIAGNOSIS — E871 Hypo-osmolality and hyponatremia: Secondary | ICD-10-CM | POA: Diagnosis not present

## 2024-04-11 DIAGNOSIS — A419 Sepsis, unspecified organism: Secondary | ICD-10-CM | POA: Diagnosis not present

## 2024-04-11 DIAGNOSIS — N390 Urinary tract infection, site not specified: Secondary | ICD-10-CM | POA: Diagnosis not present

## 2024-04-11 DIAGNOSIS — N171 Acute kidney failure with acute cortical necrosis: Secondary | ICD-10-CM | POA: Diagnosis not present

## 2024-04-11 DIAGNOSIS — E43 Unspecified severe protein-calorie malnutrition: Secondary | ICD-10-CM | POA: Diagnosis not present

## 2024-04-11 DIAGNOSIS — Z419 Encounter for procedure for purposes other than remedying health state, unspecified: Secondary | ICD-10-CM | POA: Diagnosis not present

## 2024-04-11 DIAGNOSIS — R627 Adult failure to thrive: Secondary | ICD-10-CM | POA: Diagnosis not present

## 2024-04-11 DIAGNOSIS — R6521 Severe sepsis with septic shock: Secondary | ICD-10-CM | POA: Diagnosis not present

## 2024-04-11 DIAGNOSIS — E512 Wernicke's encephalopathy: Secondary | ICD-10-CM | POA: Diagnosis not present

## 2024-04-11 DIAGNOSIS — G928 Other toxic encephalopathy: Secondary | ICD-10-CM | POA: Diagnosis not present

## 2024-04-11 DIAGNOSIS — N179 Acute kidney failure, unspecified: Secondary | ICD-10-CM | POA: Diagnosis not present

## 2024-04-11 DIAGNOSIS — I10 Essential (primary) hypertension: Secondary | ICD-10-CM | POA: Diagnosis not present

## 2024-04-11 DIAGNOSIS — E639 Nutritional deficiency, unspecified: Secondary | ICD-10-CM | POA: Diagnosis not present

## 2024-04-12 DIAGNOSIS — N179 Acute kidney failure, unspecified: Secondary | ICD-10-CM | POA: Diagnosis not present

## 2024-04-13 DIAGNOSIS — N179 Acute kidney failure, unspecified: Secondary | ICD-10-CM | POA: Diagnosis not present

## 2024-04-14 DIAGNOSIS — N179 Acute kidney failure, unspecified: Secondary | ICD-10-CM | POA: Diagnosis not present

## 2024-04-15 DIAGNOSIS — E43 Unspecified severe protein-calorie malnutrition: Secondary | ICD-10-CM | POA: Diagnosis not present

## 2024-04-15 DIAGNOSIS — R112 Nausea with vomiting, unspecified: Secondary | ICD-10-CM | POA: Diagnosis not present

## 2024-04-15 DIAGNOSIS — A419 Sepsis, unspecified organism: Secondary | ICD-10-CM | POA: Diagnosis not present

## 2024-04-15 DIAGNOSIS — K5900 Constipation, unspecified: Secondary | ICD-10-CM | POA: Diagnosis not present

## 2024-04-15 DIAGNOSIS — R5381 Other malaise: Secondary | ICD-10-CM | POA: Diagnosis not present

## 2024-04-15 DIAGNOSIS — F32A Depression, unspecified: Secondary | ICD-10-CM | POA: Diagnosis not present

## 2024-04-15 DIAGNOSIS — Z7189 Other specified counseling: Secondary | ICD-10-CM | POA: Diagnosis not present

## 2024-04-15 DIAGNOSIS — N179 Acute kidney failure, unspecified: Secondary | ICD-10-CM | POA: Diagnosis not present

## 2024-04-15 DIAGNOSIS — E512 Wernicke's encephalopathy: Secondary | ICD-10-CM | POA: Diagnosis not present

## 2024-04-15 DIAGNOSIS — Z515 Encounter for palliative care: Secondary | ICD-10-CM | POA: Diagnosis not present

## 2024-04-15 DIAGNOSIS — N171 Acute kidney failure with acute cortical necrosis: Secondary | ICD-10-CM | POA: Diagnosis not present

## 2024-04-15 DIAGNOSIS — R627 Adult failure to thrive: Secondary | ICD-10-CM | POA: Diagnosis not present

## 2024-04-16 DIAGNOSIS — T17320A Food in larynx causing asphyxiation, initial encounter: Secondary | ICD-10-CM | POA: Diagnosis not present

## 2024-04-16 DIAGNOSIS — N179 Acute kidney failure, unspecified: Secondary | ICD-10-CM | POA: Diagnosis not present

## 2024-04-16 DIAGNOSIS — R5381 Other malaise: Secondary | ICD-10-CM | POA: Diagnosis not present

## 2024-04-16 DIAGNOSIS — R1310 Dysphagia, unspecified: Secondary | ICD-10-CM | POA: Diagnosis not present

## 2024-04-16 DIAGNOSIS — R627 Adult failure to thrive: Secondary | ICD-10-CM | POA: Diagnosis not present

## 2024-04-16 DIAGNOSIS — R059 Cough, unspecified: Secondary | ICD-10-CM | POA: Diagnosis not present

## 2024-04-16 DIAGNOSIS — R1311 Dysphagia, oral phase: Secondary | ICD-10-CM | POA: Diagnosis not present

## 2024-04-16 DIAGNOSIS — R52 Pain, unspecified: Secondary | ICD-10-CM | POA: Diagnosis not present

## 2024-04-16 DIAGNOSIS — Z515 Encounter for palliative care: Secondary | ICD-10-CM | POA: Diagnosis not present

## 2024-04-16 DIAGNOSIS — E512 Wernicke's encephalopathy: Secondary | ICD-10-CM | POA: Diagnosis not present

## 2024-04-16 DIAGNOSIS — F32A Depression, unspecified: Secondary | ICD-10-CM | POA: Diagnosis not present

## 2024-04-16 DIAGNOSIS — E43 Unspecified severe protein-calorie malnutrition: Secondary | ICD-10-CM | POA: Diagnosis not present

## 2024-04-16 DIAGNOSIS — Z7189 Other specified counseling: Secondary | ICD-10-CM | POA: Diagnosis not present

## 2024-04-17 DIAGNOSIS — R627 Adult failure to thrive: Secondary | ICD-10-CM | POA: Diagnosis not present

## 2024-04-17 DIAGNOSIS — N179 Acute kidney failure, unspecified: Secondary | ICD-10-CM | POA: Diagnosis not present

## 2024-04-17 DIAGNOSIS — R52 Pain, unspecified: Secondary | ICD-10-CM | POA: Diagnosis not present

## 2024-04-17 DIAGNOSIS — Z515 Encounter for palliative care: Secondary | ICD-10-CM | POA: Diagnosis not present

## 2024-04-17 DIAGNOSIS — R5381 Other malaise: Secondary | ICD-10-CM | POA: Diagnosis not present

## 2024-04-17 DIAGNOSIS — E512 Wernicke's encephalopathy: Secondary | ICD-10-CM | POA: Diagnosis not present

## 2024-04-17 DIAGNOSIS — F32A Depression, unspecified: Secondary | ICD-10-CM | POA: Diagnosis not present

## 2024-04-17 DIAGNOSIS — Z7189 Other specified counseling: Secondary | ICD-10-CM | POA: Diagnosis not present

## 2024-04-17 DIAGNOSIS — R1311 Dysphagia, oral phase: Secondary | ICD-10-CM | POA: Diagnosis not present

## 2024-04-17 DIAGNOSIS — E43 Unspecified severe protein-calorie malnutrition: Secondary | ICD-10-CM | POA: Diagnosis not present

## 2024-04-18 DIAGNOSIS — R5381 Other malaise: Secondary | ICD-10-CM | POA: Diagnosis not present

## 2024-04-18 DIAGNOSIS — N390 Urinary tract infection, site not specified: Secondary | ICD-10-CM | POA: Diagnosis not present

## 2024-04-18 DIAGNOSIS — A419 Sepsis, unspecified organism: Secondary | ICD-10-CM | POA: Diagnosis not present

## 2024-04-18 DIAGNOSIS — R1311 Dysphagia, oral phase: Secondary | ICD-10-CM | POA: Diagnosis not present

## 2024-04-18 DIAGNOSIS — A0472 Enterocolitis due to Clostridium difficile, not specified as recurrent: Secondary | ICD-10-CM | POA: Diagnosis not present

## 2024-04-18 DIAGNOSIS — F32A Depression, unspecified: Secondary | ICD-10-CM | POA: Diagnosis not present

## 2024-04-18 DIAGNOSIS — E512 Wernicke's encephalopathy: Secondary | ICD-10-CM | POA: Diagnosis not present

## 2024-04-18 DIAGNOSIS — Z960 Presence of urogenital implants: Secondary | ICD-10-CM | POA: Diagnosis not present

## 2024-04-18 DIAGNOSIS — R6521 Severe sepsis with septic shock: Secondary | ICD-10-CM | POA: Diagnosis not present

## 2024-04-18 DIAGNOSIS — I10 Essential (primary) hypertension: Secondary | ICD-10-CM | POA: Diagnosis not present

## 2024-04-18 DIAGNOSIS — K5730 Diverticulosis of large intestine without perforation or abscess without bleeding: Secondary | ICD-10-CM | POA: Diagnosis not present

## 2024-04-18 DIAGNOSIS — G928 Other toxic encephalopathy: Secondary | ICD-10-CM | POA: Diagnosis not present

## 2024-04-18 DIAGNOSIS — R188 Other ascites: Secondary | ICD-10-CM | POA: Diagnosis not present

## 2024-04-18 DIAGNOSIS — E43 Unspecified severe protein-calorie malnutrition: Secondary | ICD-10-CM | POA: Diagnosis not present

## 2024-04-18 DIAGNOSIS — R52 Pain, unspecified: Secondary | ICD-10-CM | POA: Diagnosis not present

## 2024-04-18 DIAGNOSIS — E639 Nutritional deficiency, unspecified: Secondary | ICD-10-CM | POA: Diagnosis not present

## 2024-04-18 DIAGNOSIS — N179 Acute kidney failure, unspecified: Secondary | ICD-10-CM | POA: Diagnosis not present

## 2024-04-18 DIAGNOSIS — Z7189 Other specified counseling: Secondary | ICD-10-CM | POA: Diagnosis not present

## 2024-04-18 DIAGNOSIS — Z515 Encounter for palliative care: Secondary | ICD-10-CM | POA: Diagnosis not present

## 2024-04-18 DIAGNOSIS — N171 Acute kidney failure with acute cortical necrosis: Secondary | ICD-10-CM | POA: Diagnosis not present

## 2024-04-18 DIAGNOSIS — R627 Adult failure to thrive: Secondary | ICD-10-CM | POA: Diagnosis not present

## 2024-04-19 DIAGNOSIS — N179 Acute kidney failure, unspecified: Secondary | ICD-10-CM | POA: Diagnosis not present

## 2024-04-19 DIAGNOSIS — E860 Dehydration: Secondary | ICD-10-CM | POA: Diagnosis not present

## 2024-04-19 DIAGNOSIS — Z515 Encounter for palliative care: Secondary | ICD-10-CM | POA: Diagnosis not present

## 2024-04-19 DIAGNOSIS — G928 Other toxic encephalopathy: Secondary | ICD-10-CM | POA: Diagnosis not present

## 2024-04-19 DIAGNOSIS — R52 Pain, unspecified: Secondary | ICD-10-CM | POA: Diagnosis not present

## 2024-04-19 DIAGNOSIS — A498 Other bacterial infections of unspecified site: Secondary | ICD-10-CM | POA: Diagnosis not present

## 2024-04-19 DIAGNOSIS — I700 Atherosclerosis of aorta: Secondary | ICD-10-CM | POA: Diagnosis not present

## 2024-04-19 DIAGNOSIS — Z960 Presence of urogenital implants: Secondary | ICD-10-CM | POA: Diagnosis not present

## 2024-04-19 DIAGNOSIS — F32A Depression, unspecified: Secondary | ICD-10-CM | POA: Diagnosis not present

## 2024-04-19 DIAGNOSIS — E785 Hyperlipidemia, unspecified: Secondary | ICD-10-CM | POA: Diagnosis not present

## 2024-04-19 DIAGNOSIS — E512 Wernicke's encephalopathy: Secondary | ICD-10-CM | POA: Diagnosis not present

## 2024-04-19 DIAGNOSIS — J449 Chronic obstructive pulmonary disease, unspecified: Secondary | ICD-10-CM | POA: Diagnosis not present

## 2024-04-19 DIAGNOSIS — Z7189 Other specified counseling: Secondary | ICD-10-CM | POA: Diagnosis not present

## 2024-04-19 DIAGNOSIS — K5733 Diverticulitis of large intestine without perforation or abscess with bleeding: Secondary | ICD-10-CM | POA: Diagnosis not present

## 2024-04-19 DIAGNOSIS — E43 Unspecified severe protein-calorie malnutrition: Secondary | ICD-10-CM | POA: Diagnosis not present

## 2024-04-19 DIAGNOSIS — N390 Urinary tract infection, site not specified: Secondary | ICD-10-CM | POA: Diagnosis not present

## 2024-04-19 DIAGNOSIS — R1311 Dysphagia, oral phase: Secondary | ICD-10-CM | POA: Diagnosis not present

## 2024-04-19 DIAGNOSIS — A0472 Enterocolitis due to Clostridium difficile, not specified as recurrent: Secondary | ICD-10-CM | POA: Diagnosis not present

## 2024-04-19 DIAGNOSIS — R5381 Other malaise: Secondary | ICD-10-CM | POA: Diagnosis not present

## 2024-04-19 DIAGNOSIS — J9610 Chronic respiratory failure, unspecified whether with hypoxia or hypercapnia: Secondary | ICD-10-CM | POA: Diagnosis not present

## 2024-04-19 DIAGNOSIS — N1330 Unspecified hydronephrosis: Secondary | ICD-10-CM | POA: Diagnosis not present

## 2024-04-19 DIAGNOSIS — R627 Adult failure to thrive: Secondary | ICD-10-CM | POA: Diagnosis not present

## 2024-04-19 DIAGNOSIS — I7143 Infrarenal abdominal aortic aneurysm, without rupture: Secondary | ICD-10-CM | POA: Diagnosis not present

## 2024-04-19 DIAGNOSIS — R531 Weakness: Secondary | ICD-10-CM | POA: Diagnosis not present

## 2024-04-19 DIAGNOSIS — G629 Polyneuropathy, unspecified: Secondary | ICD-10-CM | POA: Diagnosis not present

## 2024-04-22 DIAGNOSIS — G629 Polyneuropathy, unspecified: Secondary | ICD-10-CM | POA: Diagnosis not present

## 2024-04-22 DIAGNOSIS — E785 Hyperlipidemia, unspecified: Secondary | ICD-10-CM | POA: Diagnosis not present

## 2024-04-22 DIAGNOSIS — L8990 Pressure ulcer of unspecified site, unspecified stage: Secondary | ICD-10-CM | POA: Diagnosis not present

## 2024-04-22 DIAGNOSIS — N319 Neuromuscular dysfunction of bladder, unspecified: Secondary | ICD-10-CM | POA: Diagnosis not present

## 2024-04-22 DIAGNOSIS — E512 Wernicke's encephalopathy: Secondary | ICD-10-CM | POA: Diagnosis not present

## 2024-04-22 DIAGNOSIS — A498 Other bacterial infections of unspecified site: Secondary | ICD-10-CM | POA: Diagnosis not present

## 2024-04-22 DIAGNOSIS — E639 Nutritional deficiency, unspecified: Secondary | ICD-10-CM | POA: Diagnosis not present

## 2024-04-22 DIAGNOSIS — J449 Chronic obstructive pulmonary disease, unspecified: Secondary | ICD-10-CM | POA: Diagnosis not present

## 2024-04-22 DIAGNOSIS — A0472 Enterocolitis due to Clostridium difficile, not specified as recurrent: Secondary | ICD-10-CM | POA: Diagnosis not present

## 2024-04-22 DIAGNOSIS — I10 Essential (primary) hypertension: Secondary | ICD-10-CM | POA: Diagnosis not present

## 2024-04-23 DIAGNOSIS — J9610 Chronic respiratory failure, unspecified whether with hypoxia or hypercapnia: Secondary | ICD-10-CM | POA: Diagnosis not present

## 2024-04-23 DIAGNOSIS — G629 Polyneuropathy, unspecified: Secondary | ICD-10-CM | POA: Diagnosis not present

## 2024-04-23 DIAGNOSIS — L8990 Pressure ulcer of unspecified site, unspecified stage: Secondary | ICD-10-CM | POA: Diagnosis not present

## 2024-04-23 DIAGNOSIS — E43 Unspecified severe protein-calorie malnutrition: Secondary | ICD-10-CM | POA: Diagnosis not present

## 2024-04-23 DIAGNOSIS — R627 Adult failure to thrive: Secondary | ICD-10-CM | POA: Diagnosis not present

## 2024-04-23 DIAGNOSIS — E512 Wernicke's encephalopathy: Secondary | ICD-10-CM | POA: Diagnosis not present

## 2024-04-23 DIAGNOSIS — N319 Neuromuscular dysfunction of bladder, unspecified: Secondary | ICD-10-CM | POA: Diagnosis not present

## 2024-04-23 DIAGNOSIS — F321 Major depressive disorder, single episode, moderate: Secondary | ICD-10-CM | POA: Diagnosis not present

## 2024-04-24 DIAGNOSIS — E43 Unspecified severe protein-calorie malnutrition: Secondary | ICD-10-CM | POA: Diagnosis not present

## 2024-04-24 DIAGNOSIS — R627 Adult failure to thrive: Secondary | ICD-10-CM | POA: Diagnosis not present

## 2024-04-24 DIAGNOSIS — L8990 Pressure ulcer of unspecified site, unspecified stage: Secondary | ICD-10-CM | POA: Diagnosis not present

## 2024-04-24 DIAGNOSIS — R1319 Other dysphagia: Secondary | ICD-10-CM | POA: Diagnosis not present

## 2024-04-24 DIAGNOSIS — N390 Urinary tract infection, site not specified: Secondary | ICD-10-CM | POA: Diagnosis not present

## 2024-04-24 DIAGNOSIS — E512 Wernicke's encephalopathy: Secondary | ICD-10-CM | POA: Diagnosis not present

## 2024-04-30 DIAGNOSIS — N3090 Cystitis, unspecified without hematuria: Secondary | ICD-10-CM | POA: Diagnosis not present

## 2024-04-30 DIAGNOSIS — E43 Unspecified severe protein-calorie malnutrition: Secondary | ICD-10-CM | POA: Diagnosis not present

## 2024-04-30 DIAGNOSIS — K5733 Diverticulitis of large intestine without perforation or abscess with bleeding: Secondary | ICD-10-CM | POA: Diagnosis not present

## 2024-04-30 DIAGNOSIS — N390 Urinary tract infection, site not specified: Secondary | ICD-10-CM | POA: Diagnosis not present

## 2024-04-30 DIAGNOSIS — G9340 Encephalopathy, unspecified: Secondary | ICD-10-CM | POA: Diagnosis not present

## 2024-04-30 DIAGNOSIS — E785 Hyperlipidemia, unspecified: Secondary | ICD-10-CM | POA: Diagnosis not present

## 2024-04-30 DIAGNOSIS — A498 Other bacterial infections of unspecified site: Secondary | ICD-10-CM | POA: Diagnosis not present

## 2024-04-30 DIAGNOSIS — R4182 Altered mental status, unspecified: Secondary | ICD-10-CM | POA: Diagnosis not present

## 2024-04-30 DIAGNOSIS — G629 Polyneuropathy, unspecified: Secondary | ICD-10-CM | POA: Diagnosis not present

## 2024-04-30 DIAGNOSIS — M25552 Pain in left hip: Secondary | ICD-10-CM | POA: Diagnosis not present

## 2024-04-30 DIAGNOSIS — J449 Chronic obstructive pulmonary disease, unspecified: Secondary | ICD-10-CM | POA: Diagnosis not present

## 2024-04-30 DIAGNOSIS — R531 Weakness: Secondary | ICD-10-CM | POA: Diagnosis not present

## 2024-04-30 DIAGNOSIS — G928 Other toxic encephalopathy: Secondary | ICD-10-CM | POA: Diagnosis not present

## 2024-04-30 DIAGNOSIS — I7143 Infrarenal abdominal aortic aneurysm, without rupture: Secondary | ICD-10-CM | POA: Diagnosis not present

## 2024-04-30 DIAGNOSIS — N3000 Acute cystitis without hematuria: Secondary | ICD-10-CM | POA: Diagnosis not present

## 2024-04-30 DIAGNOSIS — E512 Wernicke's encephalopathy: Secondary | ICD-10-CM | POA: Diagnosis not present

## 2024-04-30 DIAGNOSIS — N179 Acute kidney failure, unspecified: Secondary | ICD-10-CM | POA: Diagnosis not present

## 2024-04-30 DIAGNOSIS — J9610 Chronic respiratory failure, unspecified whether with hypoxia or hypercapnia: Secondary | ICD-10-CM | POA: Diagnosis not present

## 2024-04-30 DIAGNOSIS — E860 Dehydration: Secondary | ICD-10-CM | POA: Diagnosis not present

## 2024-05-01 DIAGNOSIS — N3090 Cystitis, unspecified without hematuria: Secondary | ICD-10-CM | POA: Diagnosis not present

## 2024-05-02 DIAGNOSIS — N3090 Cystitis, unspecified without hematuria: Secondary | ICD-10-CM | POA: Diagnosis not present

## 2024-05-03 DIAGNOSIS — N3090 Cystitis, unspecified without hematuria: Secondary | ICD-10-CM | POA: Diagnosis not present

## 2024-05-06 DIAGNOSIS — F321 Major depressive disorder, single episode, moderate: Secondary | ICD-10-CM | POA: Diagnosis not present

## 2024-05-06 DIAGNOSIS — E512 Wernicke's encephalopathy: Secondary | ICD-10-CM | POA: Diagnosis not present

## 2024-05-06 DIAGNOSIS — N390 Urinary tract infection, site not specified: Secondary | ICD-10-CM | POA: Diagnosis not present

## 2024-05-06 DIAGNOSIS — L89150 Pressure ulcer of sacral region, unstageable: Secondary | ICD-10-CM | POA: Diagnosis not present

## 2024-05-06 DIAGNOSIS — J9610 Chronic respiratory failure, unspecified whether with hypoxia or hypercapnia: Secondary | ICD-10-CM | POA: Diagnosis not present

## 2024-05-06 DIAGNOSIS — N319 Neuromuscular dysfunction of bladder, unspecified: Secondary | ICD-10-CM | POA: Diagnosis not present

## 2024-05-06 DIAGNOSIS — E43 Unspecified severe protein-calorie malnutrition: Secondary | ICD-10-CM | POA: Diagnosis not present

## 2024-05-11 DIAGNOSIS — Z419 Encounter for procedure for purposes other than remedying health state, unspecified: Secondary | ICD-10-CM | POA: Diagnosis not present

## 2024-05-14 DIAGNOSIS — L89154 Pressure ulcer of sacral region, stage 4: Secondary | ICD-10-CM | POA: Diagnosis not present

## 2024-05-14 DIAGNOSIS — R000 Tachycardia, unspecified: Secondary | ICD-10-CM | POA: Diagnosis not present

## 2024-05-14 DIAGNOSIS — G9340 Encephalopathy, unspecified: Secondary | ICD-10-CM | POA: Diagnosis not present

## 2024-05-14 DIAGNOSIS — N179 Acute kidney failure, unspecified: Secondary | ICD-10-CM | POA: Diagnosis not present

## 2024-05-14 DIAGNOSIS — G9341 Metabolic encephalopathy: Secondary | ICD-10-CM | POA: Diagnosis not present

## 2024-05-14 DIAGNOSIS — J449 Chronic obstructive pulmonary disease, unspecified: Secondary | ICD-10-CM | POA: Diagnosis not present

## 2024-05-14 DIAGNOSIS — R6520 Severe sepsis without septic shock: Secondary | ICD-10-CM | POA: Diagnosis not present

## 2024-05-14 DIAGNOSIS — N390 Urinary tract infection, site not specified: Secondary | ICD-10-CM | POA: Diagnosis not present

## 2024-05-14 DIAGNOSIS — K6389 Other specified diseases of intestine: Secondary | ICD-10-CM | POA: Diagnosis not present

## 2024-05-14 DIAGNOSIS — L89159 Pressure ulcer of sacral region, unspecified stage: Secondary | ICD-10-CM | POA: Diagnosis not present

## 2024-05-14 DIAGNOSIS — I7143 Infrarenal abdominal aortic aneurysm, without rupture: Secondary | ICD-10-CM | POA: Diagnosis not present

## 2024-05-14 DIAGNOSIS — R578 Other shock: Secondary | ICD-10-CM | POA: Diagnosis not present

## 2024-05-14 DIAGNOSIS — Z9981 Dependence on supplemental oxygen: Secondary | ICD-10-CM | POA: Diagnosis not present

## 2024-05-14 DIAGNOSIS — A419 Sepsis, unspecified organism: Secondary | ICD-10-CM | POA: Diagnosis not present

## 2024-05-14 DIAGNOSIS — R6521 Severe sepsis with septic shock: Secondary | ICD-10-CM | POA: Diagnosis not present

## 2024-05-15 DIAGNOSIS — A419 Sepsis, unspecified organism: Secondary | ICD-10-CM | POA: Diagnosis not present

## 2024-05-15 DIAGNOSIS — S31000D Unspecified open wound of lower back and pelvis without penetration into retroperitoneum, subsequent encounter: Secondary | ICD-10-CM | POA: Diagnosis not present

## 2024-05-15 DIAGNOSIS — G9341 Metabolic encephalopathy: Secondary | ICD-10-CM | POA: Diagnosis not present

## 2024-05-15 DIAGNOSIS — R627 Adult failure to thrive: Secondary | ICD-10-CM | POA: Diagnosis not present

## 2024-05-15 DIAGNOSIS — N179 Acute kidney failure, unspecified: Secondary | ICD-10-CM | POA: Diagnosis not present

## 2024-05-15 DIAGNOSIS — E46 Unspecified protein-calorie malnutrition: Secondary | ICD-10-CM | POA: Diagnosis not present

## 2024-05-15 DIAGNOSIS — M4628 Osteomyelitis of vertebra, sacral and sacrococcygeal region: Secondary | ICD-10-CM | POA: Diagnosis not present

## 2024-05-15 DIAGNOSIS — R6521 Severe sepsis with septic shock: Secondary | ICD-10-CM | POA: Diagnosis not present

## 2024-05-15 DIAGNOSIS — L98429 Non-pressure chronic ulcer of back with unspecified severity: Secondary | ICD-10-CM | POA: Diagnosis not present

## 2024-05-16 DIAGNOSIS — Z66 Do not resuscitate: Secondary | ICD-10-CM | POA: Diagnosis not present

## 2024-05-16 DIAGNOSIS — R6521 Severe sepsis with septic shock: Secondary | ICD-10-CM | POA: Diagnosis not present

## 2024-05-16 DIAGNOSIS — Z515 Encounter for palliative care: Secondary | ICD-10-CM | POA: Diagnosis not present

## 2024-05-16 DIAGNOSIS — N179 Acute kidney failure, unspecified: Secondary | ICD-10-CM | POA: Diagnosis not present

## 2024-05-16 DIAGNOSIS — R630 Anorexia: Secondary | ICD-10-CM | POA: Diagnosis not present

## 2024-05-16 DIAGNOSIS — Z7189 Other specified counseling: Secondary | ICD-10-CM | POA: Diagnosis not present

## 2024-05-16 DIAGNOSIS — A419 Sepsis, unspecified organism: Secondary | ICD-10-CM | POA: Diagnosis not present

## 2024-05-16 DIAGNOSIS — S31000D Unspecified open wound of lower back and pelvis without penetration into retroperitoneum, subsequent encounter: Secondary | ICD-10-CM | POA: Diagnosis not present

## 2024-05-16 DIAGNOSIS — G9341 Metabolic encephalopathy: Secondary | ICD-10-CM | POA: Diagnosis not present

## 2024-05-17 DIAGNOSIS — R6521 Severe sepsis with septic shock: Secondary | ICD-10-CM | POA: Diagnosis not present

## 2024-05-17 DIAGNOSIS — A419 Sepsis, unspecified organism: Secondary | ICD-10-CM | POA: Diagnosis not present

## 2024-05-17 DIAGNOSIS — J449 Chronic obstructive pulmonary disease, unspecified: Secondary | ICD-10-CM | POA: Diagnosis not present

## 2024-05-17 DIAGNOSIS — G9341 Metabolic encephalopathy: Secondary | ICD-10-CM | POA: Diagnosis not present

## 2024-05-17 DIAGNOSIS — S31000D Unspecified open wound of lower back and pelvis without penetration into retroperitoneum, subsequent encounter: Secondary | ICD-10-CM | POA: Diagnosis not present

## 2024-05-17 DIAGNOSIS — N179 Acute kidney failure, unspecified: Secondary | ICD-10-CM | POA: Diagnosis not present

## 2024-05-17 DIAGNOSIS — A0472 Enterocolitis due to Clostridium difficile, not specified as recurrent: Secondary | ICD-10-CM | POA: Diagnosis not present

## 2024-05-18 DIAGNOSIS — R6521 Severe sepsis with septic shock: Secondary | ICD-10-CM | POA: Diagnosis not present

## 2024-05-18 DIAGNOSIS — A419 Sepsis, unspecified organism: Secondary | ICD-10-CM | POA: Diagnosis not present

## 2024-05-18 DIAGNOSIS — R627 Adult failure to thrive: Secondary | ICD-10-CM | POA: Diagnosis not present

## 2024-05-18 DIAGNOSIS — A0472 Enterocolitis due to Clostridium difficile, not specified as recurrent: Secondary | ICD-10-CM | POA: Diagnosis not present

## 2024-05-18 DIAGNOSIS — M4628 Osteomyelitis of vertebra, sacral and sacrococcygeal region: Secondary | ICD-10-CM | POA: Diagnosis not present

## 2024-05-18 DIAGNOSIS — E512 Wernicke's encephalopathy: Secondary | ICD-10-CM | POA: Diagnosis not present

## 2024-05-18 DIAGNOSIS — N179 Acute kidney failure, unspecified: Secondary | ICD-10-CM | POA: Diagnosis not present

## 2024-05-19 DIAGNOSIS — A0472 Enterocolitis due to Clostridium difficile, not specified as recurrent: Secondary | ICD-10-CM | POA: Diagnosis not present

## 2024-05-19 DIAGNOSIS — J449 Chronic obstructive pulmonary disease, unspecified: Secondary | ICD-10-CM | POA: Diagnosis not present

## 2024-05-19 DIAGNOSIS — S31000D Unspecified open wound of lower back and pelvis without penetration into retroperitoneum, subsequent encounter: Secondary | ICD-10-CM | POA: Diagnosis not present

## 2024-05-19 DIAGNOSIS — A419 Sepsis, unspecified organism: Secondary | ICD-10-CM | POA: Diagnosis not present

## 2024-05-19 DIAGNOSIS — N179 Acute kidney failure, unspecified: Secondary | ICD-10-CM | POA: Diagnosis not present

## 2024-05-19 DIAGNOSIS — R6521 Severe sepsis with septic shock: Secondary | ICD-10-CM | POA: Diagnosis not present

## 2024-05-19 DIAGNOSIS — G9341 Metabolic encephalopathy: Secondary | ICD-10-CM | POA: Diagnosis not present

## 2024-05-20 DIAGNOSIS — R6521 Severe sepsis with septic shock: Secondary | ICD-10-CM | POA: Diagnosis not present

## 2024-05-20 DIAGNOSIS — A419 Sepsis, unspecified organism: Secondary | ICD-10-CM | POA: Diagnosis not present

## 2024-05-31 DEATH — deceased
